# Patient Record
Sex: Male | Born: 1940 | Race: White | Hispanic: No | Marital: Married | Smoking: Former smoker
Health system: Southern US, Community
[De-identification: ages and names within clinical notes are randomized; demographics above are authoritative.]

## PROBLEM LIST (undated history)

## (undated) DIAGNOSIS — K08109 Complete loss of teeth, unspecified cause, unspecified class: Secondary | ICD-10-CM

## (undated) DIAGNOSIS — J449 Chronic obstructive pulmonary disease, unspecified: Secondary | ICD-10-CM

## (undated) DIAGNOSIS — F329 Major depressive disorder, single episode, unspecified: Secondary | ICD-10-CM

## (undated) DIAGNOSIS — K219 Gastro-esophageal reflux disease without esophagitis: Secondary | ICD-10-CM

## (undated) DIAGNOSIS — Z8719 Personal history of other diseases of the digestive system: Secondary | ICD-10-CM

## (undated) DIAGNOSIS — C801 Malignant (primary) neoplasm, unspecified: Secondary | ICD-10-CM

## (undated) DIAGNOSIS — K589 Irritable bowel syndrome without diarrhea: Secondary | ICD-10-CM

## (undated) DIAGNOSIS — Z87448 Personal history of other diseases of urinary system: Secondary | ICD-10-CM

## (undated) DIAGNOSIS — K6389 Other specified diseases of intestine: Secondary | ICD-10-CM

## (undated) DIAGNOSIS — C4490 Unspecified malignant neoplasm of skin, unspecified: Secondary | ICD-10-CM

## (undated) DIAGNOSIS — R0602 Shortness of breath: Secondary | ICD-10-CM

## (undated) DIAGNOSIS — E669 Obesity, unspecified: Secondary | ICD-10-CM

## (undated) DIAGNOSIS — N400 Enlarged prostate without lower urinary tract symptoms: Secondary | ICD-10-CM

## (undated) DIAGNOSIS — J189 Pneumonia, unspecified organism: Secondary | ICD-10-CM

## (undated) DIAGNOSIS — R350 Frequency of micturition: Secondary | ICD-10-CM

## (undated) DIAGNOSIS — K5792 Diverticulitis of intestine, part unspecified, without perforation or abscess without bleeding: Secondary | ICD-10-CM

## (undated) DIAGNOSIS — F32A Depression, unspecified: Secondary | ICD-10-CM

## (undated) DIAGNOSIS — J45909 Unspecified asthma, uncomplicated: Secondary | ICD-10-CM

## (undated) DIAGNOSIS — M2550 Pain in unspecified joint: Secondary | ICD-10-CM

## (undated) DIAGNOSIS — M23329 Other meniscus derangements, posterior horn of medial meniscus, unspecified knee: Secondary | ICD-10-CM

## (undated) DIAGNOSIS — Z8619 Personal history of other infectious and parasitic diseases: Secondary | ICD-10-CM

## (undated) DIAGNOSIS — G4730 Sleep apnea, unspecified: Secondary | ICD-10-CM

## (undated) DIAGNOSIS — S83281A Other tear of lateral meniscus, current injury, right knee, initial encounter: Secondary | ICD-10-CM

## (undated) DIAGNOSIS — E291 Testicular hypofunction: Secondary | ICD-10-CM

## (undated) DIAGNOSIS — I639 Cerebral infarction, unspecified: Secondary | ICD-10-CM

## (undated) DIAGNOSIS — M1990 Unspecified osteoarthritis, unspecified site: Secondary | ICD-10-CM

## (undated) DIAGNOSIS — N529 Male erectile dysfunction, unspecified: Secondary | ICD-10-CM

## (undated) DIAGNOSIS — H9190 Unspecified hearing loss, unspecified ear: Secondary | ICD-10-CM

## (undated) DIAGNOSIS — Z87898 Personal history of other specified conditions: Secondary | ICD-10-CM

## (undated) DIAGNOSIS — R32 Unspecified urinary incontinence: Secondary | ICD-10-CM

## (undated) DIAGNOSIS — Z972 Presence of dental prosthetic device (complete) (partial): Secondary | ICD-10-CM

## (undated) DIAGNOSIS — E119 Type 2 diabetes mellitus without complications: Secondary | ICD-10-CM

## (undated) DIAGNOSIS — R519 Headache, unspecified: Secondary | ICD-10-CM

## (undated) DIAGNOSIS — R51 Headache: Secondary | ICD-10-CM

## (undated) DIAGNOSIS — T4145XA Adverse effect of unspecified anesthetic, initial encounter: Secondary | ICD-10-CM

## (undated) DIAGNOSIS — T8859XA Other complications of anesthesia, initial encounter: Secondary | ICD-10-CM

## (undated) HISTORY — PX: EYE SURGERY: SHX253

## (undated) HISTORY — DX: Unspecified urinary incontinence: R32

## (undated) HISTORY — PX: COLONOSCOPY: SHX174

## (undated) HISTORY — DX: Other meniscus derangements, posterior horn of medial meniscus, unspecified knee: M23.329

## (undated) HISTORY — DX: Benign prostatic hyperplasia without lower urinary tract symptoms: N40.0

## (undated) HISTORY — DX: Personal history of other diseases of urinary system: Z87.448

## (undated) HISTORY — PX: UPPER GI ENDOSCOPY: SHX6162

## (undated) HISTORY — DX: Testicular hypofunction: E29.1

## (undated) HISTORY — PX: BACK SURGERY: SHX140

## (undated) HISTORY — DX: Frequency of micturition: R35.0

## (undated) HISTORY — DX: Pain in unspecified joint: M25.50

## (undated) HISTORY — PX: TONSILLECTOMY: SUR1361

## (undated) HISTORY — DX: Obesity, unspecified: E66.9

## (undated) HISTORY — PX: CARPAL TUNNEL RELEASE: SHX101

## (undated) HISTORY — DX: Unspecified malignant neoplasm of skin, unspecified: C44.90

## (undated) HISTORY — DX: Other tear of lateral meniscus, current injury, right knee, initial encounter: S83.281A

---

## 1961-11-23 HISTORY — PX: APPENDECTOMY: SHX54

## 1974-11-23 HISTORY — PX: FOOT FOREIGN BODY REMOVAL: SUR1116

## 1993-11-23 DIAGNOSIS — I639 Cerebral infarction, unspecified: Secondary | ICD-10-CM

## 1993-11-23 HISTORY — DX: Cerebral infarction, unspecified: I63.9

## 1993-11-23 HISTORY — PX: NASAL SEPTUM SURGERY: SHX37

## 1993-11-23 HISTORY — PX: HEMORROIDECTOMY: SUR656

## 1993-11-23 HISTORY — PX: UVULOPALATOPHARYNGOPLASTY (UPPP)/TONSILLECTOMY/SEPTOPLASTY: SHX6164

## 2003-10-09 ENCOUNTER — Other Ambulatory Visit: Payer: Self-pay

## 2004-09-24 ENCOUNTER — Ambulatory Visit: Payer: Self-pay

## 2004-10-10 ENCOUNTER — Ambulatory Visit: Payer: Self-pay

## 2004-11-21 ENCOUNTER — Ambulatory Visit: Payer: Self-pay | Admitting: Gastroenterology

## 2005-02-13 ENCOUNTER — Ambulatory Visit: Payer: Self-pay

## 2005-12-07 ENCOUNTER — Ambulatory Visit: Payer: Self-pay | Admitting: Internal Medicine

## 2006-08-10 ENCOUNTER — Ambulatory Visit: Payer: Self-pay | Admitting: Internal Medicine

## 2007-09-01 ENCOUNTER — Ambulatory Visit: Payer: Self-pay | Admitting: Specialist

## 2008-06-21 ENCOUNTER — Observation Stay: Payer: Self-pay | Admitting: Internal Medicine

## 2009-12-20 ENCOUNTER — Ambulatory Visit: Payer: Self-pay | Admitting: General Practice

## 2010-04-01 ENCOUNTER — Ambulatory Visit: Payer: Self-pay | Admitting: Internal Medicine

## 2011-01-15 ENCOUNTER — Ambulatory Visit: Payer: Self-pay | Admitting: General Practice

## 2011-10-29 ENCOUNTER — Ambulatory Visit: Payer: Self-pay | Admitting: General Practice

## 2012-02-08 ENCOUNTER — Ambulatory Visit: Payer: Self-pay | Admitting: General Practice

## 2012-04-21 ENCOUNTER — Ambulatory Visit: Payer: Self-pay | Admitting: Gastroenterology

## 2012-04-25 LAB — PATHOLOGY REPORT

## 2012-07-15 ENCOUNTER — Ambulatory Visit: Payer: Self-pay | Admitting: General Practice

## 2012-10-07 ENCOUNTER — Ambulatory Visit: Payer: Self-pay | Admitting: General Practice

## 2012-11-23 DIAGNOSIS — J189 Pneumonia, unspecified organism: Secondary | ICD-10-CM

## 2012-11-23 HISTORY — DX: Pneumonia, unspecified organism: J18.9

## 2012-12-15 ENCOUNTER — Other Ambulatory Visit: Payer: Self-pay | Admitting: Orthopedic Surgery

## 2012-12-15 DIAGNOSIS — M25562 Pain in left knee: Secondary | ICD-10-CM

## 2012-12-18 ENCOUNTER — Ambulatory Visit
Admission: RE | Admit: 2012-12-18 | Discharge: 2012-12-18 | Disposition: A | Discharge status: Home / Self Care | Payer: PRIVATE HEALTH INSURANCE | Source: Ambulatory Visit | Attending: Orthopedic Surgery | Admitting: Orthopedic Surgery

## 2012-12-18 DIAGNOSIS — M25562 Pain in left knee: Secondary | ICD-10-CM

## 2013-01-13 ENCOUNTER — Encounter (HOSPITAL_BASED_OUTPATIENT_CLINIC_OR_DEPARTMENT_OTHER): Payer: Self-pay | Admitting: *Deleted

## 2013-01-13 NOTE — Progress Notes (Signed)
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 Will need to review with anesthesia

## 2013-01-16 NOTE — Progress Notes (Signed)
Reviewed case with Dr Inda Merlin for here

## 2013-01-17 ENCOUNTER — Ambulatory Visit (HOSPITAL_BASED_OUTPATIENT_CLINIC_OR_DEPARTMENT_OTHER): Payer: PRIVATE HEALTH INSURANCE | Admitting: Anesthesiology

## 2013-01-17 ENCOUNTER — Ambulatory Visit (HOSPITAL_BASED_OUTPATIENT_CLINIC_OR_DEPARTMENT_OTHER)
Admission: RE | Admit: 2013-01-17 | Discharge: 2013-01-18 | Disposition: A | Discharge status: Home / Self Care | Payer: PRIVATE HEALTH INSURANCE | Source: Ambulatory Visit | Attending: Orthopedic Surgery | Admitting: Orthopedic Surgery

## 2013-01-17 ENCOUNTER — Encounter (HOSPITAL_BASED_OUTPATIENT_CLINIC_OR_DEPARTMENT_OTHER): Payer: Self-pay | Admitting: Anesthesiology

## 2013-01-17 ENCOUNTER — Encounter: Payer: Self-pay | Admitting: Physician Assistant

## 2013-01-17 ENCOUNTER — Encounter (HOSPITAL_BASED_OUTPATIENT_CLINIC_OR_DEPARTMENT_OTHER)
Admission: RE | Disposition: A | Discharge status: Home / Self Care | Payer: Self-pay | Source: Ambulatory Visit | Attending: Orthopedic Surgery

## 2013-01-17 ENCOUNTER — Other Ambulatory Visit: Payer: Self-pay | Admitting: Physician Assistant

## 2013-01-17 DIAGNOSIS — M23359 Other meniscus derangements, posterior horn of lateral meniscus, unspecified knee: Secondary | ICD-10-CM | POA: Diagnosis not present

## 2013-01-17 DIAGNOSIS — J449 Chronic obstructive pulmonary disease, unspecified: Secondary | ICD-10-CM | POA: Diagnosis present

## 2013-01-17 DIAGNOSIS — M659 Unspecified synovitis and tenosynovitis, unspecified site: Secondary | ICD-10-CM | POA: Insufficient documentation

## 2013-01-17 DIAGNOSIS — M23329 Other meniscus derangements, posterior horn of medial meniscus, unspecified knee: Secondary | ICD-10-CM | POA: Diagnosis present

## 2013-01-17 DIAGNOSIS — S83281D Other tear of lateral meniscus, current injury, right knee, subsequent encounter: Secondary | ICD-10-CM

## 2013-01-17 DIAGNOSIS — M1990 Unspecified osteoarthritis, unspecified site: Secondary | ICD-10-CM

## 2013-01-17 DIAGNOSIS — M23349 Other meniscus derangements, anterior horn of lateral meniscus, unspecified knee: Secondary | ICD-10-CM | POA: Insufficient documentation

## 2013-01-17 DIAGNOSIS — G4730 Sleep apnea, unspecified: Secondary | ICD-10-CM | POA: Insufficient documentation

## 2013-01-17 DIAGNOSIS — R0602 Shortness of breath: Secondary | ICD-10-CM | POA: Insufficient documentation

## 2013-01-17 DIAGNOSIS — Z8719 Personal history of other diseases of the digestive system: Secondary | ICD-10-CM | POA: Insufficient documentation

## 2013-01-17 DIAGNOSIS — J4489 Other specified chronic obstructive pulmonary disease: Secondary | ICD-10-CM | POA: Insufficient documentation

## 2013-01-17 DIAGNOSIS — T4145XA Adverse effect of unspecified anesthetic, initial encounter: Secondary | ICD-10-CM | POA: Insufficient documentation

## 2013-01-17 DIAGNOSIS — I639 Cerebral infarction, unspecified: Secondary | ICD-10-CM | POA: Diagnosis present

## 2013-01-17 DIAGNOSIS — K08109 Complete loss of teeth, unspecified cause, unspecified class: Secondary | ICD-10-CM | POA: Insufficient documentation

## 2013-01-17 DIAGNOSIS — S83281A Other tear of lateral meniscus, current injury, right knee, initial encounter: Secondary | ICD-10-CM | POA: Insufficient documentation

## 2013-01-17 DIAGNOSIS — M23322 Other meniscus derangements, posterior horn of medial meniscus, left knee: Secondary | ICD-10-CM

## 2013-01-17 DIAGNOSIS — K219 Gastro-esophageal reflux disease without esophagitis: Secondary | ICD-10-CM | POA: Insufficient documentation

## 2013-01-17 DIAGNOSIS — T8859XA Other complications of anesthesia, initial encounter: Secondary | ICD-10-CM

## 2013-01-17 DIAGNOSIS — M2240 Chondromalacia patellae, unspecified knee: Secondary | ICD-10-CM | POA: Diagnosis not present

## 2013-01-17 DIAGNOSIS — M23305 Other meniscus derangements, unspecified medial meniscus, unspecified knee: Secondary | ICD-10-CM | POA: Diagnosis present

## 2013-01-17 DIAGNOSIS — Z972 Presence of dental prosthetic device (complete) (partial): Secondary | ICD-10-CM

## 2013-01-17 DIAGNOSIS — J45909 Unspecified asthma, uncomplicated: Secondary | ICD-10-CM

## 2013-01-17 DIAGNOSIS — H9193 Unspecified hearing loss, bilateral: Secondary | ICD-10-CM

## 2013-01-17 DIAGNOSIS — H9190 Unspecified hearing loss, unspecified ear: Secondary | ICD-10-CM | POA: Insufficient documentation

## 2013-01-17 HISTORY — DX: Shortness of breath: R06.02

## 2013-01-17 HISTORY — DX: Sleep apnea, unspecified: G47.30

## 2013-01-17 HISTORY — PX: KNEE ARTHROSCOPY: SHX127

## 2013-01-17 HISTORY — DX: Cerebral infarction, unspecified: I63.9

## 2013-01-17 HISTORY — DX: Unspecified osteoarthritis, unspecified site: M19.90

## 2013-01-17 HISTORY — DX: Complete loss of teeth, unspecified cause, unspecified class: K08.109

## 2013-01-17 HISTORY — DX: Other complications of anesthesia, initial encounter: T88.59XA

## 2013-01-17 HISTORY — DX: Presence of dental prosthetic device (complete) (partial): Z97.2

## 2013-01-17 HISTORY — DX: Personal history of other diseases of the digestive system: Z87.19

## 2013-01-17 HISTORY — DX: Chronic obstructive pulmonary disease, unspecified: J44.9

## 2013-01-17 HISTORY — DX: Adverse effect of unspecified anesthetic, initial encounter: T41.45XA

## 2013-01-17 HISTORY — DX: Gastro-esophageal reflux disease without esophagitis: K21.9

## 2013-01-17 HISTORY — DX: Unspecified asthma, uncomplicated: J45.909

## 2013-01-17 HISTORY — DX: Unspecified hearing loss, unspecified ear: H91.90

## 2013-01-17 LAB — POCT I-STAT, CHEM 8
BUN: 13 mg/dL (ref 6–23)
Creatinine, Ser: 1 mg/dL (ref 0.50–1.35)
Hemoglobin: 15 g/dL (ref 13.0–17.0)
Potassium: 3.8 mEq/L (ref 3.5–5.1)
Sodium: 140 mEq/L (ref 135–145)

## 2013-01-17 SURGERY — ARTHROSCOPY, KNEE
Anesthesia: General | Site: Knee | Laterality: Bilateral | Wound class: Clean

## 2013-01-17 MED ORDER — LACTATED RINGERS IV SOLN
INTRAVENOUS | Status: DC
  Administered 2013-01-17 (×2): via INTRAVENOUS

## 2013-01-17 MED ORDER — HYDROMORPHONE HCL PF 1 MG/ML IJ SOLN: 0.2500 mg | INTRAMUSCULAR | Status: DC | PRN

## 2013-01-17 MED ORDER — CHLORHEXIDINE GLUCONATE 4 % EX LIQD: 60.0000 mL | Freq: Once | CUTANEOUS | Status: DC

## 2013-01-17 MED ORDER — MONTELUKAST SODIUM 10 MG PO TABS
10.0000 mg | ORAL_TABLET | Freq: Every day | ORAL | Status: DC
Start: 2013-01-17 — End: 2013-01-18
  Filled 2013-01-17: qty 1

## 2013-01-17 MED ORDER — ONDANSETRON HCL 4 MG/2ML IJ SOLN: 4.0000 mg | Freq: Four times a day (QID) | INTRAMUSCULAR | Status: DC | PRN

## 2013-01-17 MED ORDER — SODIUM CHLORIDE 0.9 % IR SOLN
Status: DC | PRN
  Administered 2013-01-17: 6000 mL

## 2013-01-17 MED ORDER — PROMETHAZINE HCL 25 MG/ML IJ SOLN: 6.2500 mg | INTRAMUSCULAR | Status: DC | PRN

## 2013-01-17 MED ORDER — BUDESONIDE-FORMOTEROL FUMARATE 160-4.5 MCG/ACT IN AERO
2.0000 | INHALATION_SPRAY | Freq: Two times a day (BID) | RESPIRATORY_TRACT | Status: DC
  Administered 2013-01-17: 2 via RESPIRATORY_TRACT
  Filled 2013-01-17: qty 6

## 2013-01-17 MED ORDER — MIDAZOLAM HCL 2 MG/2ML IJ SOLN
1.0000 mg | INTRAMUSCULAR | Status: DC | PRN
  Administered 2013-01-17: 2 mg via INTRAVENOUS

## 2013-01-17 MED ORDER — LACTATED RINGERS IV SOLN
INTRAVENOUS | Status: DC
  Administered 2013-01-17: 12:00:00 via INTRAVENOUS

## 2013-01-17 MED ORDER — ALBUTEROL SULFATE HFA 108 (90 BASE) MCG/ACT IN AERS
2.0000 | INHALATION_SPRAY | Freq: Four times a day (QID) | RESPIRATORY_TRACT | Status: DC | PRN
  Filled 2013-01-17: qty 6.7

## 2013-01-17 MED ORDER — METOCLOPRAMIDE HCL 5 MG/ML IJ SOLN: 5.0000 mg | Freq: Three times a day (TID) | INTRAMUSCULAR | Status: DC | PRN

## 2013-01-17 MED ORDER — TOPIRAMATE 25 MG PO TABS
50.0000 mg | ORAL_TABLET | Freq: Two times a day (BID) | ORAL | Status: DC
  Filled 2013-01-17 (×2): qty 2

## 2013-01-17 MED ORDER — CEFAZOLIN SODIUM-DEXTROSE 2-3 GM-% IV SOLR
2.0000 g | Freq: Four times a day (QID) | INTRAVENOUS | Status: AC
  Administered 2013-01-17 – 2013-01-18 (×3): 2 g via INTRAVENOUS

## 2013-01-17 MED ORDER — HYDROCODONE-ACETAMINOPHEN 5-325 MG PO TABS: ORAL_TABLET | ORAL | Status: DC

## 2013-01-17 MED ORDER — OXYCODONE HCL 5 MG PO TABS
5.0000 mg | ORAL_TABLET | Freq: Once | ORAL | Status: AC | PRN
Start: 2013-01-17 — End: 2013-01-17

## 2013-01-17 MED ORDER — PROPOFOL 10 MG/ML IV BOLUS
INTRAVENOUS | Status: DC | PRN
  Administered 2013-01-17: 200 mg via INTRAVENOUS

## 2013-01-17 MED ORDER — DEXTROSE 5 % IV SOLN
3.0000 g | INTRAVENOUS | Status: AC
  Administered 2013-01-17: 3 g via INTRAVENOUS

## 2013-01-17 MED ORDER — DEXAMETHASONE SODIUM PHOSPHATE 4 MG/ML IJ SOLN
INTRAMUSCULAR | Status: DC | PRN
  Administered 2013-01-17: 10 mg via INTRAVENOUS

## 2013-01-17 MED ORDER — HYDROXYZINE HCL 10 MG PO TABS
10.0000 mg | ORAL_TABLET | Freq: Every day | ORAL | Status: DC
  Administered 2013-01-18: 10 mg via ORAL
  Filled 2013-01-17: qty 1

## 2013-01-17 MED ORDER — FENTANYL CITRATE 0.05 MG/ML IJ SOLN
50.0000 ug | INTRAMUSCULAR | Status: DC | PRN
  Administered 2013-01-17: 100 ug via INTRAVENOUS

## 2013-01-17 MED ORDER — LIDOCAINE HCL (CARDIAC) 20 MG/ML IV SOLN
INTRAVENOUS | Status: DC | PRN
  Administered 2013-01-17: 100 mg via INTRAVENOUS

## 2013-01-17 MED ORDER — DOCUSATE SODIUM 100 MG PO CAPS
100.0000 mg | ORAL_CAPSULE | Freq: Two times a day (BID) | ORAL | Status: DC
  Administered 2013-01-17: 100 mg via ORAL

## 2013-01-17 MED ORDER — METOCLOPRAMIDE HCL 5 MG PO TABS: 5.0000 mg | ORAL_TABLET | Freq: Three times a day (TID) | ORAL | Status: DC | PRN

## 2013-01-17 MED ORDER — ONDANSETRON HCL 4 MG PO TABS: 4.0000 mg | ORAL_TABLET | Freq: Four times a day (QID) | ORAL | Status: DC | PRN

## 2013-01-17 MED ORDER — TIOTROPIUM BROMIDE MONOHYDRATE 18 MCG IN CAPS
18.0000 ug | ORAL_CAPSULE | Freq: Every day | RESPIRATORY_TRACT | Status: DC
  Administered 2013-01-17: 18 ug via RESPIRATORY_TRACT
  Filled 2013-01-17: qty 5

## 2013-01-17 MED ORDER — LACTATED RINGERS IV SOLN
INTRAVENOUS | Status: DC
  Administered 2013-01-17: 15:00:00 via INTRAVENOUS

## 2013-01-17 MED ORDER — CLOPIDOGREL BISULFATE 75 MG PO TABS
75.0000 mg | ORAL_TABLET | Freq: Every day | ORAL | Status: DC
  Filled 2013-01-17: qty 1

## 2013-01-17 MED ORDER — OXYCODONE HCL 5 MG/5ML PO SOLN: 5.0000 mg | Freq: Once | ORAL | Status: AC | PRN

## 2013-01-17 MED ORDER — HYDROCODONE-ACETAMINOPHEN 5-325 MG PO TABS
1.0000 | ORAL_TABLET | ORAL | Status: DC | PRN
  Administered 2013-01-17 – 2013-01-18 (×5): 2 via ORAL

## 2013-01-17 MED ORDER — OXYCODONE HCL 5 MG PO TABS: 5.0000 mg | ORAL_TABLET | ORAL | Status: DC | PRN

## 2013-01-17 SURGICAL SUPPLY — 61 items
BANDAGE ELASTIC 6 VELCRO ST LF (GAUZE/BANDAGES/DRESSINGS) ×4 IMPLANT
BANDAGE ESMARK 6X9 LF (GAUZE/BANDAGES/DRESSINGS) IMPLANT
BENZOIN TINCTURE PRP APPL 2/3 (GAUZE/BANDAGES/DRESSINGS) IMPLANT
BLADE CUTTER GATOR 3.5 (BLADE) ×2 IMPLANT
BLADE GREAT WHITE 4.2 (BLADE) ×2 IMPLANT
BLADE SURG 15 STRL LF DISP TIS (BLADE) IMPLANT
BLADE SURG 15 STRL SS (BLADE)
BNDG COHESIVE 4X5 TAN STRL (GAUZE/BANDAGES/DRESSINGS) ×4 IMPLANT
BNDG ESMARK 6X9 LF (GAUZE/BANDAGES/DRESSINGS)
CANISTER OMNI JUG 16 LITER (MISCELLANEOUS) IMPLANT
CANISTER SUCTION 2500CC (MISCELLANEOUS) IMPLANT
DRAPE ARTHROSCOPY W/POUCH 90 (DRAPES) ×4 IMPLANT
DRAPE U 20/CS (DRAPES) ×2 IMPLANT
DRSG PAD ABDOMINAL 8X10 ST (GAUZE/BANDAGES/DRESSINGS) ×2 IMPLANT
DURAPREP 26ML APPLICATOR (WOUND CARE) ×4 IMPLANT
GAUZE XEROFORM 1X8 LF (GAUZE/BANDAGES/DRESSINGS) ×2 IMPLANT
GLOVE BIO SURGEON STRL SZ7 (GLOVE) ×2 IMPLANT
GLOVE BIOGEL PI IND STRL 7.0 (GLOVE) ×1 IMPLANT
GLOVE BIOGEL PI IND STRL 7.5 (GLOVE) ×1 IMPLANT
GLOVE BIOGEL PI INDICATOR 7.0 (GLOVE) ×1
GLOVE BIOGEL PI INDICATOR 7.5 (GLOVE) ×1
GLOVE SKINSENSE NS SZ7.0 (GLOVE) ×1
GLOVE SKINSENSE STRL SZ7.0 (GLOVE) ×1 IMPLANT
GLOVE SS BIOGEL STRL SZ 7.5 (GLOVE) ×1 IMPLANT
GLOVE SUPERSENSE BIOGEL SZ 7.5 (GLOVE) ×1
GOWN PREVENTION PLUS XLARGE (GOWN DISPOSABLE) ×4 IMPLANT
HOLDER KNEE FOAM BLUE (MISCELLANEOUS) IMPLANT
KNEE WRAP E Z 3 GEL PACK (MISCELLANEOUS) ×4 IMPLANT
KWIRE 4.0 X .062IN (WIRE) IMPLANT
NDL SAFETY ECLIPSE 18X1.5 (NEEDLE) IMPLANT
NEEDLE HYPO 18GX1.5 SHARP (NEEDLE)
NEEDLE HYPO 22GX1.5 SAFETY (NEEDLE) IMPLANT
PACK ARTHROSCOPY DSU (CUSTOM PROCEDURE TRAY) ×2 IMPLANT
PACK BASIN DAY SURGERY FS (CUSTOM PROCEDURE TRAY) ×2 IMPLANT
PAD ALCOHOL SWAB (MISCELLANEOUS) IMPLANT
PAD CAST 4YDX4 CTTN HI CHSV (CAST SUPPLIES) ×1 IMPLANT
PADDING CAST COTTON 4X4 STRL (CAST SUPPLIES) ×1
SET ARTHROSCOPY TUBING (MISCELLANEOUS) ×1
SET ARTHROSCOPY TUBING LN (MISCELLANEOUS) ×1 IMPLANT
SHEET MEDIUM DRAPE 40X70 STRL (DRAPES) ×2 IMPLANT
SPONGE GAUZE 4X4 12PLY (GAUZE/BANDAGES/DRESSINGS) ×4 IMPLANT
STOCKINETTE IMPERVIOUS LG (DRAPES) ×2 IMPLANT
STRIP CLOSURE SKIN 1/2X4 (GAUZE/BANDAGES/DRESSINGS) IMPLANT
SUCTION FRAZIER TIP 10 FR DISP (SUCTIONS) IMPLANT
SUT ETHILON 4 0 PS 2 18 (SUTURE) ×4 IMPLANT
SUT FIBERWIRE #2 38 T-5 BLUE (SUTURE)
SUT PDS AB 0 CT 36 (SUTURE) IMPLANT
SUT PROLENE 3 0 PS 2 (SUTURE) IMPLANT
SUT VIC AB 0 CT1 18XCR BRD 8 (SUTURE) IMPLANT
SUT VIC AB 0 CT1 8-18 (SUTURE)
SUT VIC AB 2-0 CT1 27 (SUTURE)
SUT VIC AB 2-0 CT1 TAPERPNT 27 (SUTURE) IMPLANT
SUT VIC AB 3-0 PS1 18 (SUTURE)
SUT VIC AB 3-0 PS1 18XBRD (SUTURE) IMPLANT
SUTURE FIBERWR #2 38 T-5 BLUE (SUTURE) IMPLANT
SYR 20CC LL (SYRINGE) IMPLANT
SYR 5ML LL (SYRINGE) IMPLANT
TOWEL OR 17X24 6PK STRL BLUE (TOWEL DISPOSABLE) ×2 IMPLANT
TUBE CONNECTING 20X1/4 (TUBING) ×2 IMPLANT
WAND STAR VAC 90 (SURGICAL WAND) ×2 IMPLANT
WATER STERILE IRR 1000ML POUR (IV SOLUTION) ×2 IMPLANT

## 2013-01-17 NOTE — Progress Notes (Signed)
Assisted Dr. Gypsy Balsam with right, left, knee block. Side rails up, monitors on throughout procedure. See vital signs in flow sheet. Tolerated Procedure well.

## 2013-01-17 NOTE — H&P (Signed)
Eddie Carter is an 71 y.o. male.   Chief Complaint: Bilateral knee pain HPI: Mr. Ferraris is a 71 year-old seen for evaluation from Dr. Kramer for right knee pain and left knee pain.  He twisted his right knee two months ago pulling a donkey.  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  He has had significant pain and swelling in his right knee, but he has also had longstanding pain in his left knee which he said hurts as much as his right knee.  He underwent an MRI of his right knee that showed a lateral meniscus tear with minimal degenerative changes.  He is having pain in both knees.  He has a significant medical history for TIA 18 years ago and COPD.  He is followed by Dr. Kowalski at the [facility] of cardiology and Dr. Fleming of pulmonary.  Past Medical History  Diagnosis Date  . COPD (chronic obstructive pulmonary disease)   . Shortness of breath   . Asthma   . Sleep apnea     uses a cpap  . Stroke     some speech and thought processes slow  . Arthritis   . GERD (gastroesophageal reflux disease)     no meds  . History of GI bleed   . HOH (hard of hearing)   . Full dentures   . Complication of anesthesia     after septoplasty and uvulectomy-was in icu  . Traumatic tear of lateral meniscus of right knee     right knee  . Medial meniscus, posterior horn derangement     left knee    Past Surgical History  Procedure Laterality Date  . Foot foreign body removal  1976    left foot -multiple pieces glass  . Appendectomy  1963  . Hemorroidectomy  1995  . Carpal tunnel release      left  . Colonoscopy    . Upper gi endoscopy    . Nasal septum surgery  1995  . Uvulopalatopharyngoplasty (uppp)/tonsillectomy/septoplasty  1995    same time with septoplasty-ended up ICU almost trached  . Tonsillectomy      No family history on file. Social History:  reports that he quit smoking about 17 years ago. He does not have any smokeless tobacco  history on file. He reports that  drinks alcohol. He reports that he does not use illicit drugs.  Allergies:  Allergies  Allergen Reactions  . Asa (Aspirin) Shortness Of Breath     (Not in a hospital admission)  No results found for this or any previous visit (from the past 48 hour(s)). No results found.  Review of Systems  Constitutional: Negative.   HENT: Negative.   Eyes: Negative.   Respiratory: Negative.   Cardiovascular: Negative.   Gastrointestinal: Negative.   Genitourinary: Negative.   Musculoskeletal: Positive for joint pain.  Skin: Negative.   Neurological: Negative.   Endo/Heme/Allergies: Negative.   Psychiatric/Behavioral: Negative.     There were no vitals taken for this visit. Physical Exam  Constitutional: He is oriented to person, place, and time. He appears well-developed and well-nourished.  HENT:  Head: Normocephalic and atraumatic.  Eyes: Conjunctivae and EOM are normal. Pupils are equal, round, and reactive to light.  Neck: Neck supple.  Cardiovascular: Normal rate and regular rhythm.   Respiratory: Effort normal.  GI: Soft.  Genitourinary:  Not pertinent to current symptomatology therefore not examined.  Musculoskeletal:    Examination   of his right knee reveals 2+ effusion.  Pain medially and laterally.  Range of motion 0-100 degrees with positive lateral McMurray's.  Knee is stable to ligamentous exam with normal patella tracking.  Examination of the left knee reveals 1+ synovitis.  1+ crepitation.  Range of motion 0-120 degrees.  Knee is stable with medial McMurray's positive and normal patella tracking.  Vascular exam: Diffuse venous stasis disease distally.  Right leg slightly increased swelling compared to the left.  Neurological: He is alert and oriented to person, place, and time.  Skin: Skin is warm and dry.  Psychiatric: He has a normal mood and affect.     Assessment/ Patient Active Problem List  Diagnosis  . Traumatic tear of  lateral meniscus of right knee  . Medial meniscus, posterior horn derangement  . COPD (chronic obstructive pulmonary disease)  . Shortness of breath  . Asthma  . Sleep apnea  . Stroke  . Arthritis  . GERD (gastroesophageal reflux disease)  . History of GI bleed  . HOH (hard of hearing)  . Full dentures  . Complication of anesthesia     Plan I have talked to him and his wife in detail.  I would recommend an MRI of the left knee to further delineate this pathology and determine whether he needs a left knee surgical intervention.  He does need right knee arthroscopy and may need bilateral knee arthroscopy depending on the findings of the left knee MRI.  Risks, complications and benefits of the surgery have been described to them in detail and they understand this completely.  He has been cleared by Dr. Fleming from a pulmonary standpoint and Dr. Kowalski from a cardiac standpoint for surgery.  He is able to come off Plavix five days preoperatively.  Also a question of whether he needs any carotid evaluation.  He did have a recent stress test in the last six months which he said was normal.     Copelyn Widmer J 01/17/2013, 10:03 AM    

## 2013-01-17 NOTE — Anesthesia Procedure Notes (Signed)
Procedure Name: LMA Insertion Date/Time: 01/17/2013 12:57 PM Performed by: Zenia Resides D Pre-anesthesia Checklist: Patient identified, Emergency Drugs available, Suction available and Patient being monitored Patient Re-evaluated:Patient Re-evaluated prior to inductionOxygen Delivery Method: Circle System Utilized Preoxygenation: Pre-oxygenation with 100% oxygen Intubation Type: IV induction Ventilation: Mask ventilation without difficulty LMA: LMA with gastric port inserted LMA Size: 5.0 Number of attempts: 1 Placement Confirmation: positive ETCO2 and breath sounds checked- equal and bilateral Tube secured with: Tape Dental Injury: Teeth and Oropharynx as per pre-operative assessment

## 2013-01-17 NOTE — H&P (View-Only) (Signed)
Eddie Carter is an 72 y.o. male.   Chief Complaint: Bilateral knee pain HPI: Eddie Carter is a 72 year-old seen for evaluation from Dr. Farris Has for right knee pain and left knee pain.  He twisted his right knee two months ago pulling a donkey.  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  He has had significant pain and swelling in his right knee, but he has also had longstanding pain in his left knee which he said hurts as much as his right knee.  He underwent an MRI of his right knee that showed a lateral meniscus tear with minimal degenerative changes.  He is having pain in both knees.  He has a significant medical history for TIA 18 years ago and COPD.  He is followed by Dr. Gwen Pounds at the [facility] of cardiology and Dr. Meredeth Ide of pulmonary.  Past Medical History  Diagnosis Date  . COPD (chronic obstructive pulmonary disease)   . Shortness of breath   . Asthma   . Sleep apnea     uses a cpap  . Stroke     some speech and thought processes slow  . Arthritis   . GERD (gastroesophageal reflux disease)     no meds  . History of GI bleed   . HOH (hard of hearing)   . Full dentures   . Complication of anesthesia     after septoplasty and uvulectomy-was in icu  . Traumatic tear of lateral meniscus of right knee     right knee  . Medial meniscus, posterior horn derangement     left knee    Past Surgical History  Procedure Laterality Date  . Foot foreign body removal  1976    left foot -multiple pieces glass  . Appendectomy  1963  . Hemorroidectomy  1995  . Carpal tunnel release      left  . Colonoscopy    . Upper gi endoscopy    . Nasal septum surgery  1995  . Uvulopalatopharyngoplasty (uppp)/tonsillectomy/septoplasty  1995    same time with septoplasty-ended up ICU almost trached  . Tonsillectomy      No family history on file. Social History:  reports that he quit smoking about 17 years ago. He does not have any smokeless tobacco  history on file. He reports that  drinks alcohol. He reports that he does not use illicit drugs.  Allergies:  Allergies  Allergen Reactions  . Asa (Aspirin) Shortness Of Breath     (Not in a hospital admission)  No results found for this or any previous visit (from the past 48 hour(s)). No results found.  Review of Systems  Constitutional: Negative.   HENT: Negative.   Eyes: Negative.   Respiratory: Negative.   Cardiovascular: Negative.   Gastrointestinal: Negative.   Genitourinary: Negative.   Musculoskeletal: Positive for joint pain.  Skin: Negative.   Neurological: Negative.   Endo/Heme/Allergies: Negative.   Psychiatric/Behavioral: Negative.     There were no vitals taken for this visit. Physical Exam  Constitutional: He is oriented to person, place, and time. He appears well-developed and well-nourished.  HENT:  Head: Normocephalic and atraumatic.  Eyes: Conjunctivae and EOM are normal. Pupils are equal, round, and reactive to light.  Neck: Neck supple.  Cardiovascular: Normal rate and regular rhythm.   Respiratory: Effort normal.  GI: Soft.  Genitourinary:  Not pertinent to current symptomatology therefore not examined.  Musculoskeletal:    Examination  of his right knee reveals 2+ effusion.  Pain medially and laterally.  Range of motion 0-100 degrees with positive lateral McMurray's.  Knee is stable to ligamentous exam with normal patella tracking.  Examination of the left knee reveals 1+ synovitis.  1+ crepitation.  Range of motion 0-120 degrees.  Knee is stable with medial McMurray's positive and normal patella tracking.  Vascular exam: Diffuse venous stasis disease distally.  Right leg slightly increased swelling compared to the left.  Neurological: He is alert and oriented to person, place, and time.  Skin: Skin is warm and dry.  Psychiatric: He has a normal mood and affect.     Assessment/ Patient Active Problem List  Diagnosis  . Traumatic tear of  lateral meniscus of right knee  . Medial meniscus, posterior horn derangement  . COPD (chronic obstructive pulmonary disease)  . Shortness of breath  . Asthma  . Sleep apnea  . Stroke  . Arthritis  . GERD (gastroesophageal reflux disease)  . History of GI bleed  . HOH (hard of hearing)  . Full dentures  . Complication of anesthesia     Plan I have talked to him and his wife in detail.  I would recommend an MRI of the left knee to further delineate this pathology and determine whether he needs a left knee surgical intervention.  He does need right knee arthroscopy and may need bilateral knee arthroscopy depending on the findings of the left knee MRI.  Risks, complications and benefits of the surgery have been described to them in detail and they understand this completely.  He has been cleared by Dr. Meredeth Ide from a pulmonary standpoint and Dr. Gwen Pounds from a cardiac standpoint for surgery.  He is able to come off Plavix five days preoperatively.  Also a question of whether he needs any carotid evaluation.  He did have a recent stress test in the last six months which he said was normal.     Eddie Carter J 01/17/2013, 10:03 AM

## 2013-01-17 NOTE — Anesthesia Preprocedure Evaluation (Signed)
Anesthesia Evaluation  Patient identified by MRN, date of birth, ID band Patient awake    Reviewed: Allergy & Precautions, H&P , NPO status , Patient's Chart, lab work & pertinent test results  Airway Mallampati: II TM Distance: >3 FB Neck ROM: Full    Dental   Pulmonary shortness of breath, asthma , sleep apnea and Continuous Positive Airway Pressure Ventilation , COPDformer smoker,  S/p UPPP breath sounds clear to auscultation        Cardiovascular Rhythm:Regular Rate:Normal     Neuro/Psych CVA    GI/Hepatic GERD-  ,  Endo/Other  Morbid obesity  Renal/GU      Musculoskeletal   Abdominal (+) + obese,   Peds  Hematology   Anesthesia Other Findings   Reproductive/Obstetrics                           Anesthesia Physical Anesthesia Plan  ASA: III  Anesthesia Plan: General   Post-op Pain Management:    Induction: Intravenous  Airway Management Planned: LMA  Additional Equipment:   Intra-op Plan:   Post-operative Plan: Extubation in OR  Informed Consent: I have reviewed the patients History and Physical, chart, labs and discussed the procedure including the risks, benefits and alternatives for the proposed anesthesia with the patient or authorized representative who has indicated his/her understanding and acceptance.     Plan Discussed with: CRNA and Surgeon  Anesthesia Plan Comments: (Portal local infiltration)        Anesthesia Quick Evaluation

## 2013-01-17 NOTE — Interval H&P Note (Signed)
History and Physical Interval Note:  01/17/2013 12:43 PM  Eddie Carter  has presented today for surgery, with the diagnosis of MEDIAL AND ALTERAL MENISCUS TEARS BILATERAL  The various methods of treatment have been discussed with the patient and family. After consideration of risks, benefits and other options for treatment, the patient has consented to  Procedure(s): ARTHROSCOPY KNEE BILATERAL WITH MEDIAL AND LATERAL MENISECTOMIES (Bilateral) as a surgical intervention .  The patient's history has been reviewed, patient examined, no change in status, stable for surgery.  I have reviewed the patient's chart and labs.  Questions were answered to the patient's satisfaction.     Salvatore Marvel A

## 2013-01-17 NOTE — Anesthesia Postprocedure Evaluation (Signed)
   Anesthesia Post-op Note  Patient: Elma Limas  Procedure(s) Performed: Procedure(s): ARTHROSCOPY KNEE BILATERAL WITH MEDIAL AND LATERAL MENISECTOMIES (Bilateral)  Patient Location: PACU  Anesthesia Type:General  Level of Consciousness: awake and alert   Airway and Oxygen Therapy: Patient Spontanous Breathing  Post-op Pain: mild  Post-op Assessment: Post-op Vital signs reviewed, Patient's Cardiovascular Status Stable, Respiratory Function Stable, Patent Airway, No signs of Nausea or vomiting, Adequate PO intake and Pain level controlled  Post-op Vital Signs: stable  Complications: No apparent anesthesia complications

## 2013-01-17 NOTE — Transfer of Care (Signed)
Immediate Anesthesia Transfer of Care Note  Patient: Eddie Carter  Procedure(s) Performed: Procedure(s): ARTHROSCOPY KNEE BILATERAL WITH MEDIAL AND LATERAL MENISECTOMIES (Bilateral)  Patient Location: PACU  Anesthesia Type:General  Level of Consciousness: awake, alert  and oriented  Airway & Oxygen Therapy: Patient Spontanous Breathing and Patient connected to face mask oxygen  Post-op Assessment: Report given to PACU RN and Post -op Vital signs reviewed and stable  Post vital signs: Reviewed and stable  Complications: No apparent anesthesia complications

## 2013-01-18 ENCOUNTER — Encounter (HOSPITAL_BASED_OUTPATIENT_CLINIC_OR_DEPARTMENT_OTHER): Payer: Self-pay | Admitting: Orthopedic Surgery

## 2013-01-18 DIAGNOSIS — M23329 Other meniscus derangements, posterior horn of medial meniscus, unspecified knee: Secondary | ICD-10-CM | POA: Diagnosis not present

## 2013-01-18 NOTE — Op Note (Signed)
NAMEBECKHEM, ISADORE                ACCOUNT NO.:  [id number]  MEDICAL RECORD NO.:  [id number]  LOCATION:                                 FACILITY:  PHYSICIAN:  Elana Alm. Thurston Hole, M.D. DATE OF BIRTH:  1941/11/19  DATE OF PROCEDURE:  01/17/2013 DATE OF DISCHARGE:                              OPERATIVE REPORT   PREOPERATIVE DIAGNOSES: 1. Right knee medial meniscus tear and lateral meniscus tear. 2. Right knee chondromalacia with synovitis. 3. Left knee medial meniscus tear and lateral meniscus tear. 4. Left knee chondromalacia with synovitis.  PROCEDURE: 1. Right knee EUA followed by arthroscopic partial medial and lateral     meniscectomies with chondroplasty and partial synovectomy. 2. Left knee EUA followed by arthroscopic partial medial and lateral     meniscectomies with chondroplasty and partial synovectomy.  SURGEON:  Elana Alm. Thurston Hole, MD  ASSISTANT:  Julien Girt, PA-C  ANESTHESIA:  General.  OPERATIVE TIME:  Forty five minutes.  COMPLICATIONS:  None.  INDICATION FOR PROCEDURE:  Mr. Stollings is a 72 year old gentleman who injured his right knee with a twisting injury approximately 3 months ago.  His exam and MRIs revealed meniscal tearing with chondromalacia. He has failed multiple conservative modalities and is now to undergo bilateral knee arthroscopy.  DESCRIPTION:  Mr. Phung was brought to the operating room on January 17, 2013, after knee blocks were placed in the holding room by Anesthesia.  He was placed on operative table in supine position.  He received Ancef 3 g IV preoperatively for prophylaxis.  After being placed under general anesthesia, both knees were examined.  He had full range of motion.  Knees were stable.  Ligamentous exam were normal with patellar tracking.  Both legs were prepped using sterile DuraPrep and draped using sterile technique.  Time-out procedure was called and both knees were identified as the correct knees.  Initially, the  right knee was addressed arthroscopically.  Through an anterolateral portal, the arthroscope with a pump attached was placed into an anteromedial portal, an arthroscopic probe was placed.  On initial inspection, the medial compartment, the articular cartilage showed 25-30% grade 3 chondromalacia, which was debrided.  Medial meniscus showed tearing of the posterior medial horn of which 25-30% was resected back to a stable rim.  Intercondylar notch inspected.  Anterior and posterior cruciate ligaments were normal.  Lateral compartment inspected.  He had 25-30% grade 3 chondromalacia, which was debrided.  Lateral meniscus showed tearing of the posterolateral and anterior horn of which 30-40% was resected back to a stable rim.  Patellofemoral joint showed 30% grade 3 chondromalacia, which was debrided.  The patella tracked normally. Moderate synovitis and the medial and lateral gutters were debrided, otherwise, this was free of pathology.  After this was done, it was felt that all pathology in the right knee had been addressed satisfactorily. The instruments were removed.  Portals closed with 3-0 nylon suture.  Attention was then turned to the left knee.  Through an anterolateral portal, the arthroscope with a pump attached was placed into an anteromedial portal, an arthroscopic probe was placed.  On initial inspection of the medial compartment, he was found to have 25% grade 3  chondromalacia, which was debrided.  Medial meniscus showed tearing of the posterior horn of which 20% was resected back to a stable rim. Intercondylar notch inspected.  Anterior and posterior cruciate ligaments were normal.  The lateral compartment inspected.  He had grade 1 and 2 chondromalacia.  Lateral meniscus showed a partial tear 25-30% posterolateral corner, which was resected back to a stable rim. Patellofemoral joint showed 25% grade 3 chondromalacia, which was debrided.  The patella tracked normally.   Moderate synovitis in the medial and lateral gutters were debrided, otherwise this was free of pathology.  After this done, it was felt that all pathology had been satisfactorily addressed.  The instruments removed.  Portals closed with 3-0 nylon suture.  Sterile dressings were applied to both knees, and then the patient was awakened, extubated, and taken to recovery room in stable condition.  Needle and sponge counts were correct x2 at the end of the case.  FOLLOWUP CARE:  Mr. Cordaro to be followed overnight at the [facility] for IV pain control, neurovascular monitoring, sleep apnea monitoring, and blood pressure monitoring.  He will be discharged tomorrow on Percocet for pain.  He will be seen back in the office in a week for sutures out and followup.     Wessie Shanks A. Thurston Hole, M.D.     RAW/MEDQ  D:  01/17/2013  T:  01/18/2013  Job:  [id number]

## 2013-07-25 ENCOUNTER — Inpatient Hospital Stay: Payer: Self-pay | Admitting: Specialist

## 2013-07-25 DIAGNOSIS — J440 Chronic obstructive pulmonary disease with acute lower respiratory infection: Secondary | ICD-10-CM | POA: Diagnosis not present

## 2013-07-25 LAB — PROTIME-INR
INR: 1
Prothrombin Time: 13.5 secs (ref 11.5–14.7)

## 2013-07-25 LAB — CBC
HCT: 45.2 % (ref 40.0–52.0)
HGB: 15.2 g/dL (ref 13.0–18.0)
RBC: 4.94 10*6/uL (ref 4.40–5.90)
RDW: 14.9 % — ABNORMAL HIGH (ref 11.5–14.5)
WBC: 12.6 10*3/uL — ABNORMAL HIGH (ref 3.8–10.6)

## 2013-07-25 LAB — URINALYSIS, COMPLETE
Bacteria: NONE SEEN
Bilirubin,UR: NEGATIVE
Blood: NEGATIVE
Ketone: NEGATIVE
Nitrite: NEGATIVE
Protein: NEGATIVE
RBC,UR: 1 /HPF (ref 0–5)
Specific Gravity: 1.012 (ref 1.003–1.030)
Squamous Epithelial: <1
WBC UR: 1 /HPF (ref 0–5)

## 2013-07-25 LAB — BASIC METABOLIC PANEL
BUN: 18 mg/dL (ref 7–18)
Chloride: 101 mmol/L (ref 98–107)
Creatinine: 1.03 mg/dL (ref 0.60–1.30)
EGFR (Non-African Amer.): >60
Potassium: 4.4 mmol/L (ref 3.5–5.1)

## 2013-07-25 LAB — TROPONIN I: Troponin-I: <0.02 ng/mL

## 2013-07-26 LAB — LIPID PANEL
Cholesterol: 172 mg/dL (ref 0–200)
HDL Cholesterol: 37 mg/dL — ABNORMAL LOW (ref 40–60)
VLDL Cholesterol, Calc: 50 mg/dL — ABNORMAL HIGH (ref 5–40)

## 2013-07-26 LAB — COMPREHENSIVE METABOLIC PANEL
Albumin: 3.2 g/dL — ABNORMAL LOW (ref 3.4–5.0)
Alkaline Phosphatase: 68 U/L (ref 50–136)
Anion Gap: 7 (ref 7–16)
Bilirubin,Total: 0.5 mg/dL (ref 0.2–1.0)
Calcium, Total: 9 mg/dL (ref 8.5–10.1)
Creatinine: 1.43 mg/dL — ABNORMAL HIGH (ref 0.60–1.30)
EGFR (African American): 57 — ABNORMAL LOW
EGFR (Non-African Amer.): 49 — ABNORMAL LOW
Glucose: 354 mg/dL — ABNORMAL HIGH (ref 65–99)
SGPT (ALT): 71 U/L (ref 12–78)
Sodium: 131 mmol/L — ABNORMAL LOW (ref 136–145)
Total Protein: 6.6 g/dL (ref 6.4–8.2)

## 2013-07-26 LAB — CBC WITH DIFFERENTIAL/PLATELET
Basophil #: 0.1 10*3/uL (ref 0.0–0.1)
Basophil %: 0.4 %
Eosinophil %: 0 %
Lymphocyte #: 0.8 10*3/uL — ABNORMAL LOW (ref 1.0–3.6)
MCHC: 34.4 g/dL (ref 32.0–36.0)
Monocyte #: 0.4 x10 3/mm (ref 0.2–1.0)
Neutrophil #: 18.7 10*3/uL — ABNORMAL HIGH (ref 1.4–6.5)
Neutrophil %: 93.8 %
RBC: 4.82 10*6/uL (ref 4.40–5.90)
WBC: 20 10*3/uL — ABNORMAL HIGH (ref 3.8–10.6)

## 2013-07-26 LAB — MAGNESIUM: Magnesium: 2.1 mg/dL

## 2013-07-28 LAB — BASIC METABOLIC PANEL
Anion Gap: 5 — ABNORMAL LOW (ref 7–16)
BUN: 21 mg/dL — ABNORMAL HIGH (ref 7–18)
Calcium, Total: 8.7 mg/dL (ref 8.5–10.1)
Creatinine: 1.06 mg/dL (ref 0.60–1.30)
EGFR (Non-African Amer.): >60
Glucose: 156 mg/dL — ABNORMAL HIGH (ref 65–99)
Osmolality: 278 (ref 275–301)
Potassium: 4.2 mmol/L (ref 3.5–5.1)
Sodium: 136 mmol/L (ref 136–145)

## 2013-07-28 LAB — CBC WITH DIFFERENTIAL/PLATELET
Basophil #: 0 10*3/uL (ref 0.0–0.1)
Basophil %: 0.2 %
Eosinophil #: 0 10*3/uL (ref 0.0–0.7)
Eosinophil %: 0 %
Lymphocyte #: 1.6 10*3/uL (ref 1.0–3.6)
Lymphocyte %: 10 %
MCH: 30.4 pg (ref 26.0–34.0)
Monocyte #: 0.8 x10 3/mm (ref 0.2–1.0)
Monocyte %: 5.3 %
Neutrophil #: 13.2 10*3/uL — ABNORMAL HIGH (ref 1.4–6.5)
RBC: 4.6 10*6/uL (ref 4.40–5.90)

## 2013-07-30 LAB — CULTURE, BLOOD (SINGLE)

## 2013-08-01 ENCOUNTER — Encounter: Payer: Self-pay | Admitting: Pulmonary Disease

## 2013-08-01 ENCOUNTER — Institutional Professional Consult (permissible substitution): Payer: PRIVATE HEALTH INSURANCE | Admitting: Pulmonary Disease

## 2013-08-22 ENCOUNTER — Encounter: Payer: Self-pay | Admitting: Pulmonary Disease

## 2013-08-22 ENCOUNTER — Ambulatory Visit (INDEPENDENT_AMBULATORY_CARE_PROVIDER_SITE_OTHER): Payer: PRIVATE HEALTH INSURANCE | Admitting: Pulmonary Disease

## 2013-08-22 VITALS — BP 110/70 | HR 95 | Ht 72.0 in | Wt 330.0 lb

## 2013-08-22 DIAGNOSIS — J8410 Pulmonary fibrosis, unspecified: Secondary | ICD-10-CM

## 2013-08-22 DIAGNOSIS — J849 Interstitial pulmonary disease, unspecified: Secondary | ICD-10-CM | POA: Insufficient documentation

## 2013-08-22 DIAGNOSIS — J449 Chronic obstructive pulmonary disease, unspecified: Secondary | ICD-10-CM

## 2013-08-22 DIAGNOSIS — R0602 Shortness of breath: Secondary | ICD-10-CM

## 2013-08-22 NOTE — Patient Instructions (Signed)
We will request records of your clinic notes, echocardiogram and stress test from [facility]. We will send you for pulmonary function testing and a Chest X-ray at [facility] Use your symbicort with a spacer We will see you back in one month or sooner if needed

## 2013-08-22 NOTE — Assessment & Plan Note (Signed)
Given Eddie Carter lengthy smoking history COPD is very likely. I do not have records of prior pulmonary function testing. Considering the fact that I am concerned about concomitant pulmonary fibrosis I will order a full pulmonary function tests to more thoroughly evaluate him.  At this point, I do not see major gaps in his therapy but I question whether or not he should be using his Symbicort with a spacer.  Plan: -He plans to have a flu shot at a local pharmacy this week and refused it today -Add a spacer for Symbicort, encourage regular use -Continue Spiriva -Full pulmonary function testing -Oxygen at night

## 2013-08-22 NOTE — Progress Notes (Signed)
Subjective:    Patient ID: Eddie Carter, male    DOB: October 26, 1941, 72 y.o.   MRN: [id number]  HPI  PCP: [facility] clinic sent him here for shortness of breath  About 7-8 weeks ago he started having coughing and cougldn't clear any mucus; difficulty brathn, it got wose [place] [facility] [place]  On the second day of September he went there and was checked out.  He had been taking prednisone which made his blood sugar go profoundly high (400) so he was hospitalized at [facility].  Was hospitalized for about 5 days.    He continues to have trouble breathing.  He as COPD and asthma and hadan infection in his lungs which caused his breathing trouble. He was never sure if he had pneumonia or not.  He was discharged on prednisone and an antibiotic, but he isn't sure which one.  He was started on diabetic medicine.  Shortness of breath continues to be the primary prioblem.  Any exertion causs it.  Sometimes he has the dyspna at rest.  He sleeps with CPAP with oxygen at night, he has been on these for at least ten years.  Oxygen added a few years ago.    He also "uses" portable oxygen as needed.  He was told to start using oxygen at least three or four.  Smoked 15 cigarettes a day for a few years, then increased to 2 packs per day 40 years, quit smoking 1997.  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  He had an inhalationation exposure to it in [place] or 1972.  He was exposed to an Tourist information centre manager called urea-formadehide in 1978 when it was being used for spray insulation in his house.  He was exposed to this for years before he had it removed.  He said this made him sick while h ewas exposed to it.  Saw Flemming at [facility] years ago and [facility] before him.  He started using CPAP with Demayo.    Has been referred to Dr. Avelino Leeds who ordered a carotid ultrasound and EKG which he said was OK.  Had a stress test a year ago which was normal.      Past Medical History  Diagnosis Date  . COPD (chronic obstructive pulmonary disease)   . Shortness of breath   . Asthma   . Sleep apnea     uses a cpap  . Stroke     some speech and thought processes slow  . Arthritis   . GERD (gastroesophageal reflux disease)     no meds  . History of GI bleed   . HOH (hard of hearing)   . Full dentures   . Complication of anesthesia     after septoplasty and uvulectomy-was in icu  . Traumatic tear of lateral meniscus of right knee     right knee  . Medial meniscus, posterior horn derangement     left knee     Family History  Problem Relation Age of Onset  . Cancer Mother   . Heart attack Father      History   Social History  . Marital Status: Married    Spouse Name: N/A    Number of Children: N/A  . Years of Education: N/A   Occupational History  . Not on file.   Social History Main Topics  . Smoking status: Former Smoker -- 1.50 packs/day for 40 years    Types:  Cigarettes    Quit date: 01/14/1996  . Smokeless tobacco: Never Used  . Alcohol Use: Yes     Comment: occ  . Drug Use: No  . Sexual Activity: Not on file   Other Topics Concern  . Not on file   Social History Narrative  . No narrative on file     Allergies  Allergen Reactions  . Asa [Aspirin] Shortness Of Breath  . Nsaids      Outpatient Prescriptions Prior to Visit  Medication Sig Dispense Refill  . albuterol (PROVENTIL HFA;VENTOLIN HFA) 108 (90 BASE) MCG/ACT inhaler Inhale 2 puffs into the lungs every 6 (six) hours as needed for wheezing.      . AVODART 0.5 MG capsule Take 1 capsule by mouth daily.      . budesonide-formoterol (SYMBICORT) 160-4.5 MCG/ACT inhaler Inhale 2 puffs into the lungs 2 (two) times daily.      . clopidogrel (PLAVIX) 75 MG tablet Take 75 mg by mouth daily.      . fish oil-omega-3 fatty acids 1000 MG capsule Take 2 g by mouth daily.      Marland Kitchen glipiZIDE (GLUCOTROL) 5 MG tablet Take 1 tablet by mouth 2 (two) times daily.      .  montelukast (SINGULAIR) 10 MG tablet Take 10 mg by mouth at bedtime.      . solifenacin (VESICARE) 10 MG tablet Take by mouth daily.      . tamsulosin (FLOMAX) 0.4 MG CAPS capsule Take 1 capsule by mouth daily.      Marland Kitchen testosterone (ANDROGEL) 50 MG/5GM GEL Place 5 g onto the skin daily.      Marland Kitchen tiotropium (SPIRIVA) 18 MCG inhalation capsule Place 18 mcg into inhaler and inhale daily.      Marland Kitchen topiramate (TOPAMAX) 50 MG tablet Take 50 mg by mouth 2 (two) times daily.       No facility-administered medications prior to visit.      Review of Systems  Constitutional: Negative for fever, chills, diaphoresis, activity change, appetite change, fatigue and unexpected weight change.  HENT: Negative for hearing loss, ear pain, nosebleeds, congestion, sore throat, facial swelling, rhinorrhea, sneezing, mouth sores, trouble swallowing, neck pain, neck stiffness, dental problem, voice change, postnasal drip, sinus pressure, tinnitus and ear discharge.   Eyes: Negative for photophobia, discharge, itching and visual disturbance.  Respiratory: Positive for chest tightness and shortness of breath. Negative for apnea, cough, choking, wheezing and stridor.   Cardiovascular: Negative for chest pain, palpitations and leg swelling.  Gastrointestinal: Negative for nausea, vomiting, abdominal pain, constipation, blood in stool and abdominal distention.  Genitourinary: Negative for dysuria, urgency, frequency, hematuria, flank pain, decreased urine volume and difficulty urinating.  Musculoskeletal: Negative for myalgias, back pain, joint swelling, arthralgias and gait problem.  Skin: Negative for color change, pallor and rash.  Neurological: Negative for dizziness, tremors, seizures, syncope, speech difficulty, weakness, light-headedness, numbness and headaches.  Hematological: Negative for adenopathy. Does not bruise/bleed easily.  Psychiatric/Behavioral: Negative for confusion, sleep disturbance and agitation. The  patient is not nervous/anxious.        Objective:   Physical Exam  Filed Vitals:   08/22/13 1514  BP: 110/70  Pulse: 95  Height: 6' (1.829 m)  Weight: 330 lb (149.687 kg)  SpO2: 92%   Gen: well appearing, no acute distress HEENT: NCAT, PERRL, EOMi, OP clear, neck supple without masses PULM: Few crackles in the bases, some scattered exp wheezes CV: RRR, no mgr, no JVD AB: BS+, soft, nontender, no  hsm Ext: warm, chronic pitting edema, no clubbing, no cyanosis Derm: no rash or skin breakdown Neuro: A&Ox4, CN II-XII intact, strength 5/5 in all 4 extremities       Assessment & Plan:   COPD (chronic obstructive pulmonary disease) Given Mr. Encinas lengthy smoking history COPD is very likely. I do not have records of prior pulmonary function testing. Considering the fact that I am concerned about concomitant pulmonary fibrosis I will order a full pulmonary function tests to more thoroughly evaluate him.  At this point, I do not see major gaps in his therapy but I question whether or not he should be using his Symbicort with a spacer.  Plan: -He plans to have a flu shot at a local pharmacy this week and refused it today -Add a spacer for Symbicort, encourage regular use -Continue Spiriva -Full pulmonary function testing -Oxygen at night   ILD (interstitial lung disease) Mr. 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 He has had a CT scan in 2007 which showed interstitial changes in his right lung as well as some mediastinal lymphadenopathy. His most recent chest x-ray which was performed on 07/25/2013 showed some interstitial changes in his lungs bilaterally which I believe may be related to this.  Plan: -To start, we will order full pulmonary function testing and a repeat chest x-ray -Depending on those results he may need a serologic workup as well as a CT scan of his chest.   Updated Medication  List Outpatient Encounter Prescriptions as of 08/22/2013  Medication Sig Dispense Refill  . albuterol (PROVENTIL HFA;VENTOLIN HFA) 108 (90 BASE) MCG/ACT inhaler Inhale 2 puffs into the lungs every 6 (six) hours as needed for wheezing.      . AVODART 0.5 MG capsule Take 1 capsule by mouth daily.      . budesonide-formoterol (SYMBICORT) 160-4.5 MCG/ACT inhaler Inhale 2 puffs into the lungs 2 (two) times daily.      . clopidogrel (PLAVIX) 75 MG tablet Take 75 mg by mouth daily.      . fish oil-omega-3 fatty acids 1000 MG capsule Take 2 g by mouth daily.      Marland Kitchen glipiZIDE (GLUCOTROL) 5 MG tablet Take 1 tablet by mouth 2 (two) times daily.      . hydrOXYzine (ATARAX/VISTARIL) 10 MG tablet Take 2 tablets by mouth at bedtime.      . insulin detemir (LEVEMIR) 100 UNIT/ML injection Inject 15 Units into the skin daily.      . montelukast (SINGULAIR) 10 MG tablet Take 10 mg by mouth at bedtime.      . solifenacin (VESICARE) 10 MG tablet Take by mouth daily.      . tamsulosin (FLOMAX) 0.4 MG CAPS capsule Take 1 capsule by mouth daily.      Marland Kitchen testosterone (ANDROGEL) 50 MG/5GM GEL Place 5 g onto the skin daily.      Marland Kitchen tiotropium (SPIRIVA) 18 MCG inhalation capsule Place 18 mcg into inhaler and inhale daily.      Marland Kitchen topiramate (TOPAMAX) 50 MG tablet Take 50 mg by mouth 2 (two) times daily.       No facility-administered encounter medications on file as of 08/22/2013.

## 2013-08-22 NOTE — Assessment & Plan Note (Signed)
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 He has had a CT scan in 2007 which showed interstitial changes in his right lung as well as some mediastinal lymphadenopathy. His most recent chest x-ray which was performed on 07/25/2013 showed some interstitial changes in his lungs bilaterally which I believe may be related to this.  Plan: -To start, we will order full pulmonary function testing and a repeat chest x-ray -Depending on those results he may need a serologic workup as well as a CT scan of his chest.

## 2013-09-19 ENCOUNTER — Ambulatory Visit: Payer: PRIVATE HEALTH INSURANCE | Admitting: Pulmonary Disease

## 2013-09-19 NOTE — Progress Notes (Signed)
No show

## 2013-09-25 ENCOUNTER — Telehealth: Payer: Self-pay | Admitting: Pulmonary Disease

## 2013-09-25 ENCOUNTER — Encounter: Payer: Self-pay | Admitting: Pulmonary Disease

## 2013-09-25 ENCOUNTER — Ambulatory Visit (INDEPENDENT_AMBULATORY_CARE_PROVIDER_SITE_OTHER): Payer: PRIVATE HEALTH INSURANCE | Admitting: Pulmonary Disease

## 2013-09-25 VITALS — BP 122/72 | HR 82 | Ht 72.0 in | Wt 309.0 lb

## 2013-09-25 DIAGNOSIS — J8410 Pulmonary fibrosis, unspecified: Secondary | ICD-10-CM

## 2013-09-25 DIAGNOSIS — R0602 Shortness of breath: Secondary | ICD-10-CM

## 2013-09-25 DIAGNOSIS — J849 Interstitial pulmonary disease, unspecified: Secondary | ICD-10-CM

## 2013-09-25 MED ORDER — DOXYCYCLINE HYCLATE 50 MG PO CAPS: 100.0000 mg | ORAL_CAPSULE | Freq: Two times a day (BID) | ORAL | Status: DC

## 2013-09-25 MED ORDER — PREDNISONE 10 MG PO TABS: ORAL_TABLET | ORAL | Status: DC

## 2013-09-25 NOTE — Patient Instructions (Signed)
Take the prednisone and the doxycycline as written We will order full pulmonary function testing and a Chest CT and call you with the results  We will see you back in 3-4 weeks or sooner if needed

## 2013-09-25 NOTE — Progress Notes (Signed)
Subjective:    Patient ID: Eddie Carter, male    DOB: Apr 06, 1941, 72 y.o.   MRN: [id number]  Synopsis: Eddie Carter first saw the [facility] pulmonary clinic in September of 2014 for evaluation of shortness of breath. He had smoked heavily in the past and had previously been given a diagnosis of COPD. He also noted in that visit that he had 2 separate inhalational injuries in the 1970s: 1 with Olefin and a second with urea-formaldehyde.   HPI  09/19/2013 ROV> Eddie Carter is still having a lot of breahting trouble in the last few weeks, and he is getting up some sputum. He feels beter when he coughs it up.  There is no color to the sputum, and he has had no fever.   Never had his lung function test or the CT scan sine the last visit.  He continues to take his inhalers on a regular basis and is currently using the Symbicort with a spacer. He feels that his shortness of breath has been getting slowly worse over the last year. He was recently seen by his cardiologist and had an echocardiogram which was normal.  Past Medical History  Diagnosis Date  . COPD (chronic obstructive pulmonary disease)   . Shortness of breath   . Asthma   . Sleep apnea     uses a cpap  . Stroke     some speech and thought processes slow  . Arthritis   . GERD (gastroesophageal reflux disease)     no meds  . History of GI bleed   . HOH (hard of hearing)   . Full dentures   . Complication of anesthesia     after septoplasty and uvulectomy-was in icu  . Traumatic tear of lateral meniscus of right knee     right knee  . Medial meniscus, posterior horn derangement     left knee     Review of Systems  Constitutional: Positive for fatigue. Negative for fever and chills.  HENT: Negative for congestion, postnasal drip, rhinorrhea and sinus pressure.   Respiratory: Positive for cough and shortness of breath. Negative for wheezing.   Cardiovascular: Positive for leg swelling. Negative for chest pain and  palpitations.       Objective:   Physical Exam  Filed Vitals:   09/25/13 1107  BP: 122/72  Pulse: 82  Height: 6' (1.829 m)  Weight: 309 lb (140.161 kg)  SpO2: 90%  RA  Walked 500 feet on room air and did not desaturate below 88%  Gen: mild dyspnea HEENT: NCAT, EOMi PULM: Crackles in bases CV: RRR, no mgr AB: BS+, soft, nontender Ext: chronic edema       Assessment & Plan:   Shortness of breath Unfortunately, Eddie Carter did not go get to the CT scan or the pulmonary function tests that I recommended after the last visit. I remain concerned that he has an interstitial lung disease which is causing his shortness of breath. I also feel that COPD is likely.  Today on exam he is not wheezing like a patient with COPD might. He does have crackles in the bases of his lungs which concern me for interstitial lung disease.  He recently had an echocardiogram performed by his cardiologist which was normal.  Plan: -Because of his history of COPD I will treat his shortness of breath as a flare with prednisone and doxycycline, but again we really need to figure out if he's got another process happening. -CT chest and PFT  Updated Medication List Outpatient Encounter Prescriptions as of 09/25/2013  Medication Sig  . albuterol (PROVENTIL HFA;VENTOLIN HFA) 108 (90 BASE) MCG/ACT inhaler Inhale 2 puffs into the lungs every 6 (six) hours as needed for wheezing.  . AVODART 0.5 MG capsule Take 1 capsule by mouth daily.  . budesonide-formoterol (SYMBICORT) 160-4.5 MCG/ACT inhaler Inhale 2 puffs into the lungs 2 (two) times daily.  . clopidogrel (PLAVIX) 75 MG tablet Take 75 mg by mouth daily.  . fish oil-omega-3 fatty acids 1000 MG capsule Take 2 g by mouth daily.  Marland Kitchen glipiZIDE (GLUCOTROL) 5 MG tablet Take 1 tablet by mouth 2 (two) times daily.  . hydrOXYzine (ATARAX/VISTARIL) 10 MG tablet Take 2 tablets by mouth at bedtime.  . insulin detemir (LEVEMIR) 100 UNIT/ML injection Inject 15  Units into the skin daily.  . montelukast (SINGULAIR) 10 MG tablet Take 10 mg by mouth at bedtime.  . solifenacin (VESICARE) 10 MG tablet Take by mouth daily.  . tamsulosin (FLOMAX) 0.4 MG CAPS capsule Take 1 capsule by mouth daily.  Marland Kitchen testosterone (ANDROGEL) 50 MG/5GM GEL Place 5 g onto the skin daily.  Marland Kitchen tiotropium (SPIRIVA) 18 MCG inhalation capsule Place 18 mcg into inhaler and inhale daily.  Marland Kitchen topiramate (TOPAMAX) 50 MG tablet Take 50 mg by mouth 2 (two) times daily.

## 2013-09-25 NOTE — Telephone Encounter (Signed)
Pharmacy was confused on the quantity that was sent in by BQ. Verified with pharmacist that pt is to take the prednisone with these directions. #30 was sent but pt only needs #27. [facility] just wanted to verify sig due to the quantity that was sent in. Nothing further was needed.

## 2013-09-25 NOTE — Telephone Encounter (Signed)
I spoke with [facility]. Was advised the prednisone directions and quantity does not match up. They need clarification. Pt is there now. I spoke with Lillia Abed and she will speak with Dr. Kendrick Fries. Once done she will call the pharmacy. Will forward message to her.

## 2013-09-25 NOTE — Addendum Note (Signed)
Addended by: Caryl Ada on: 09/25/2013 03:23 PM   Modules accepted: Orders

## 2013-09-25 NOTE — Assessment & Plan Note (Addendum)
Unfortunately, [name] did not go get to the CT scan or the pulmonary function tests that I recommended after the last visit. I remain concerned that he has an interstitial lung disease which is causing his shortness of breath. I also feel that COPD is likely.  Today on exam he is not wheezing like a patient with COPD might. He does have crackles in the bases of his lungs which concern me for interstitial lung disease.  He recently had an echocardiogram performed by his cardiologist which was normal.  Plan: -Because of his history of COPD I will treat his shortness of breath as a flare with prednisone and doxycycline, but again we really need to figure out if he's got another process happening. -CT chest and PFT

## 2013-09-28 ENCOUNTER — Ambulatory Visit (HOSPITAL_COMMUNITY)
Admission: RE | Admit: 2013-09-28 | Discharge: 2013-09-28 | Disposition: A | Discharge status: Home / Self Care | Payer: PRIVATE HEALTH INSURANCE | Source: Ambulatory Visit | Attending: Pulmonary Disease | Admitting: Pulmonary Disease

## 2013-09-28 DIAGNOSIS — J438 Other emphysema: Secondary | ICD-10-CM | POA: Insufficient documentation

## 2013-09-28 DIAGNOSIS — K7689 Other specified diseases of liver: Secondary | ICD-10-CM | POA: Insufficient documentation

## 2013-09-28 DIAGNOSIS — R918 Other nonspecific abnormal finding of lung field: Secondary | ICD-10-CM | POA: Insufficient documentation

## 2013-09-28 DIAGNOSIS — J849 Interstitial pulmonary disease, unspecified: Secondary | ICD-10-CM

## 2013-09-28 DIAGNOSIS — G4730 Sleep apnea, unspecified: Secondary | ICD-10-CM | POA: Insufficient documentation

## 2013-10-02 ENCOUNTER — Telehealth: Payer: Self-pay

## 2013-10-02 DIAGNOSIS — J8410 Pulmonary fibrosis, unspecified: Secondary | ICD-10-CM

## 2013-10-02 DIAGNOSIS — J439 Emphysema, unspecified: Secondary | ICD-10-CM

## 2013-10-02 NOTE — Telephone Encounter (Signed)
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  This means he really needs to use his inhalers and the medicines I recently prescribed.  Also, when he finishes the prednisone he needs to have the following labs:      Ana      Ers      CPR      Rf      Ccp      Anti scl 70      Anti centromere      Ssa      Sub      Aldolase      Anti jo 1            Thanks      Kipp Brood                  ----- Message -----         From: Rad Results In Interface         Sent: 09/29/2013   4:31 PM           To: Lupita Leash, MD       ------

## 2013-10-03 ENCOUNTER — Ambulatory Visit: Payer: Self-pay | Admitting: Pulmonary Disease

## 2013-10-03 LAB — PULMONARY FUNCTION TEST

## 2013-10-04 ENCOUNTER — Other Ambulatory Visit (INDEPENDENT_AMBULATORY_CARE_PROVIDER_SITE_OTHER): Payer: PRIVATE HEALTH INSURANCE

## 2013-10-04 DIAGNOSIS — J8410 Pulmonary fibrosis, unspecified: Secondary | ICD-10-CM

## 2013-10-04 DIAGNOSIS — J439 Emphysema, unspecified: Secondary | ICD-10-CM

## 2013-10-04 LAB — RHEUMATOID FACTOR: Rhuematoid fact SerPl-aCnc: <10 IU/mL (ref <=14)

## 2013-10-04 NOTE — Telephone Encounter (Signed)
lmom for pt to call back to the office so I can relay ct chest results and inform pt of lab orders post prednisone tx. Caulfield,Ashley L

## 2013-10-04 NOTE — Telephone Encounter (Signed)
Pt returned my telephone call, I passed along the CT chest results.  I verified that he finished his prednisone pack and advised him to go to the lab to have his scheduled labs done.  Pt states he is having these today.  I assured him that we will contact him with these results as we receive them.   Joslyne Marshburn L

## 2013-10-05 LAB — SJOGRENS SYNDROME-B EXTRACTABLE NUCLEAR ANTIBODY: SSB (La) (ENA) Antibody, IgG: <1 AU/mL (ref <30)

## 2013-10-05 LAB — JO-1 ANTIBODY-IGG: Jo-1 Antibody, IgG: 1 AU/mL (ref <30)

## 2013-10-05 LAB — CENTROMERE ANTIBODIES: Centromere Ab Screen: <1 AU/mL (ref <30)

## 2013-10-05 LAB — SJOGRENS SYNDROME-A EXTRACTABLE NUCLEAR ANTIBODY: SSA (Ro) (ENA) Antibody, IgG: 17 AU/mL (ref <30)

## 2013-10-06 LAB — ALDOLASE: Aldolase: 8.3 U/L — ABNORMAL HIGH (ref <=8.1)

## 2013-10-11 ENCOUNTER — Telehealth: Payer: Self-pay | Admitting: *Deleted

## 2013-10-11 ENCOUNTER — Encounter: Payer: Self-pay | Admitting: Pulmonary Disease

## 2013-10-11 NOTE — Telephone Encounter (Signed)
Message copied by Caryl Ada on Wed Oct 11, 2013  9:35 AM ------      Message from: Max Fickle B      Created: Wed Oct 11, 2013  9:31 AM       L,            Please let him know that his PFT's showed COPD and scarring/fibrosis.  We will talk about this on the next visit.            Thanks,      B ------

## 2013-10-11 NOTE — Telephone Encounter (Signed)
lmtcb x1 

## 2013-10-12 NOTE — Telephone Encounter (Signed)
Pt is aware of results. 

## 2013-10-25 ENCOUNTER — Encounter (INDEPENDENT_AMBULATORY_CARE_PROVIDER_SITE_OTHER): Payer: Self-pay

## 2013-10-25 ENCOUNTER — Ambulatory Visit (INDEPENDENT_AMBULATORY_CARE_PROVIDER_SITE_OTHER): Payer: PRIVATE HEALTH INSURANCE | Admitting: Pulmonary Disease

## 2013-10-25 ENCOUNTER — Encounter: Payer: Self-pay | Admitting: Pulmonary Disease

## 2013-10-25 VITALS — BP 132/76 | HR 77 | Temp 98.0°F | Ht 72.0 in | Wt 340.0 lb

## 2013-10-25 DIAGNOSIS — J8410 Pulmonary fibrosis, unspecified: Secondary | ICD-10-CM

## 2013-10-25 DIAGNOSIS — R918 Other nonspecific abnormal finding of lung field: Secondary | ICD-10-CM | POA: Insufficient documentation

## 2013-10-25 DIAGNOSIS — J449 Chronic obstructive pulmonary disease, unspecified: Secondary | ICD-10-CM

## 2013-10-25 DIAGNOSIS — J849 Interstitial pulmonary disease, unspecified: Secondary | ICD-10-CM

## 2013-10-25 DIAGNOSIS — J441 Chronic obstructive pulmonary disease with (acute) exacerbation: Secondary | ICD-10-CM

## 2013-10-25 MED ORDER — ROFLUMILAST 500 MCG PO TABS: 500.0000 ug | ORAL_TABLET | Freq: Every day | ORAL | Status: DC

## 2013-10-25 NOTE — Progress Notes (Signed)
Subjective:    Patient ID: Eddie Carter, male    DOB: 06/10/1941, 72 y.o.   MRN: [id number]  Synopsis: Mr. Frampton first saw the [facility] pulmonary clinic in September of 2014 for evaluation of shortness of breath. He had smoked heavily in the past and had previously been given a diagnosis of COPD. He also noted in that visit that he had 2 separate inhalational injuries in the 1970s: 1 with Olefin and a second with urea-formaldehyde.   09/2013 Full PFT [facility] > Ratio 58%, FEV1 1.94L (63% pred), only 4% change with bronchodilator; TLC 5.07L (73% pred), ERV 0.20L (13% pred) DLCO 12.7 (43% pred) 09/2013 CT Chest > emphysema and intersitial (interlobular, peripheral based, more prominent upper lobes) thickening upper lobe predomniant, no clear GGO or lymphadenopathy  HPI   09/19/2013 ROV> Mr. Ferrick is still having a lot of breahting trouble in the last few weeks, and he is getting up some sputum. He feels beter when he coughs it up.  There is no color to the sputum, and he has had no fever.   Never had his lung function test or the CT scan sine the last visit.  He continues to take his inhalers on a regular basis and is currently using the Symbicort with a spacer. He feels that his shortness of breath has been getting slowly worse over the last year. He was recently seen by his cardiologist and had an echocardiogram which was normal.  10/24/2013 ROV > Breathing is improved since the last visit, but still not great.  It sounds like he responded really well to the prednisone that I gave him last time.  He says that 10-15 weeks ago he had a bad cough started and caused a lot of chest pain.  He was originally sent to the ER and then was hospitalized for several days, noted to be diabetic.    Past Medical History  Diagnosis Date  . COPD (chronic obstructive pulmonary disease)   . Shortness of breath   . Asthma   . Sleep apnea     uses a cpap  . Stroke     some speech and thought processes slow   . Arthritis   . GERD (gastroesophageal reflux disease)     no meds  . History of GI bleed   . HOH (hard of hearing)   . Full dentures   . Complication of anesthesia     after septoplasty and uvulectomy-was in icu  . Traumatic tear of lateral meniscus of right knee     right knee  . Medial meniscus, posterior horn derangement     left knee     Review of Systems  Constitutional: Positive for fatigue. Negative for fever and chills.  HENT: Negative for congestion, postnasal drip, rhinorrhea and sinus pressure.   Respiratory: Positive for cough and shortness of breath. Negative for wheezing.   Cardiovascular: Positive for leg swelling. Negative for chest pain and palpitations.       Objective:   Physical Exam   Filed Vitals:   10/25/13 0906  BP: 132/76  Pulse: 77  Temp: 98 F (36.7 C)  TempSrc: Oral  Height: 6' (1.829 m)  Weight: 340 lb (154.223 kg)  SpO2: 93%  RA  Walked 500 feet on room air and did not desaturate below 93%  Gen: no acute distress HEENT: NCAT, EOMi PULM: Crackles in bases, no wheezing CV: RRR, no mgr AB: BS+, soft, nontender Ext: chronic edema improved today  Assessment & Plan:   ILD (interstitial lung disease) I do not believe that the CT scan findings are consistent with usual interstitial pneumonitis.  However, in his age group this would be the most likely etiology. I still think that it is possible that his prior inhalational exposures are somehow related to his interstitial lung disease. It is not clear to me if this is progressing. He clearly is quite limited by his lung disease given the profound diffusion limitation seen on pulmonary function testing which is a result of both emphysema and interstitial lung disease.  Plan: -He really needs to try to lose weight and maximize his COPD therapy. -Pulmonary rehabilitation and exercises is key -We will collect another set of pulmonary function testing and CT scan being in 6 months, at  that point the last one of our thoracic radiologists to read the study  COPD (chronic obstructive pulmonary disease) He has moderate airflow obstruction but his shortness of breath is exacerbated by the concomitant interstitial lung disease, obesity and profound deconditioning. He has been very well treated by his primary care physician with good inhalers. The only addition that I have is Roflumilast given the fact that he has had 2 exacerbations in the last year.  Plan: -Pulmonary rehabilitation, pulmonary rehabilitation, pulmonary rehabilitation -Add Roflumilast excellent-continue Spiriva and Symbicort  Pulmonary nodules These were noted on his 09/2013 study but he remembers having these years ago.  Repeat CT chest in 6 months    Updated Medication List Outpatient Encounter Prescriptions as of 10/25/2013  Medication Sig  . albuterol (PROVENTIL HFA;VENTOLIN HFA) 108 (90 BASE) MCG/ACT inhaler Inhale 2 puffs into the lungs every 6 (six) hours as needed for wheezing.  . AVODART 0.5 MG capsule Take 1 capsule by mouth daily.  . budesonide-formoterol (SYMBICORT) 160-4.5 MCG/ACT inhaler Inhale 2 puffs into the lungs 2 (two) times daily.  . clopidogrel (PLAVIX) 75 MG tablet Take 75 mg by mouth daily.  . fish oil-omega-3 fatty acids 1000 MG capsule Take 2 g by mouth daily.  . hydrOXYzine (ATARAX/VISTARIL) 10 MG tablet Take 2 tablets by mouth at bedtime.  . insulin detemir (LEVEMIR) 100 UNIT/ML injection Inject 15 Units into the skin daily.  . montelukast (SINGULAIR) 10 MG tablet Take 10 mg by mouth at bedtime.  Marland Kitchen OVER THE COUNTER MEDICATION Stool softener..once daily as needed  . solifenacin (VESICARE) 10 MG tablet Take by mouth daily.  . tamsulosin (FLOMAX) 0.4 MG CAPS capsule Take 1 capsule by mouth daily.  Marland Kitchen testosterone (ANDROGEL) 50 MG/5GM GEL Place 5 g onto the skin daily.  Marland Kitchen tiotropium (SPIRIVA) 18 MCG inhalation capsule Place 18 mcg into inhaler and inhale daily.  Marland Kitchen topiramate  (TOPAMAX) 50 MG tablet Take 50 mg by mouth 2 (two) times daily.  . roflumilast (DALIRESP) 500 MCG TABS tablet Take 1 tablet (500 mcg total) by mouth daily.  . [DISCONTINUED] doxycycline (VIBRAMYCIN) 50 MG capsule Take 2 capsules (100 mg total) by mouth 2 (two) times daily.  . [DISCONTINUED] glipiZIDE (GLUCOTROL) 5 MG tablet Take 1 tablet by mouth 2 (two) times daily.  . [DISCONTINUED] predniSONE (DELTASONE) 10 MG tablet Take 40mg  po daily for 3 days, then take 30mg  po daily for 3 days, then take 20mg  po daily for two days, then take 10mg  po daily for 2 days

## 2013-10-25 NOTE — Patient Instructions (Signed)
Keep using your inhalers as you are doing Start taking Daliresp one tablet daily; if you have significant diarrhea, hold the pills for a few days then start taking them every other day Go to pulmonary rehab We will see you back in 3months or sooner if needed We will repeat a CT chest and Pulmonary function tests in 6 months

## 2013-10-25 NOTE — Assessment & Plan Note (Signed)
He has moderate airflow obstruction but his shortness of breath is exacerbated by the concomitant interstitial lung disease, obesity and profound deconditioning. He has been very well treated by his primary care physician with good inhalers. The only addition that I have is Roflumilast given the fact that he has had 2 exacerbations in the last year.  Plan: -Pulmonary rehabilitation, pulmonary rehabilitation, pulmonary rehabilitation -Add Roflumilast excellent-continue Spiriva and Symbicort

## 2013-10-25 NOTE — Assessment & Plan Note (Signed)
These were noted on his 09/2013 study but he remembers having these years ago.  Repeat CT chest in 6 months

## 2013-10-25 NOTE — Assessment & Plan Note (Addendum)
I do not believe that the CT scan findings are consistent with usual interstitial pneumonitis.  However, in his age group this would be the most likely etiology. I still think that it is possible that his prior inhalational exposures are somehow related to his interstitial lung disease. It is not clear to me if this is progressing. He clearly is quite limited by his lung disease given the profound diffusion limitation seen on pulmonary function testing which is a result of both emphysema and interstitial lung disease.  Plan: -He really needs to try to lose weight and maximize his COPD therapy. -Pulmonary rehabilitation and exercises is key -We will collect another set of pulmonary function testing and CT scan being in 6 months, at that point the last one of our thoracic radiologists to read the study

## 2013-10-26 ENCOUNTER — Encounter: Payer: Self-pay | Admitting: Pulmonary Disease

## 2013-11-14 ENCOUNTER — Telehealth: Payer: Self-pay | Admitting: Family Medicine

## 2013-11-14 NOTE — Telephone Encounter (Signed)
Thanks Zella Ball, I'll keep an eye out for that.

## 2013-11-14 NOTE — Telephone Encounter (Signed)
Patient came in today wanting Dr Kendrick Fries fill out handicap form Sent interoffice mail

## 2013-11-21 ENCOUNTER — Encounter: Payer: Self-pay | Admitting: Pulmonary Disease

## 2013-11-23 ENCOUNTER — Encounter: Payer: Self-pay | Admitting: Pulmonary Disease

## 2013-12-05 ENCOUNTER — Telehealth: Payer: Self-pay

## 2013-12-05 NOTE — Telephone Encounter (Signed)
Spoke with pt, he is aware that handicap sticker form is filled out and ready for pickup.  Nothing further needed.

## 2013-12-05 NOTE — Telephone Encounter (Signed)
Pt's handicap form is complete.  LMTCB X1 to let pt know it is ready for pickup.

## 2013-12-15 ENCOUNTER — Encounter: Payer: Self-pay | Admitting: Pulmonary Disease

## 2013-12-24 ENCOUNTER — Encounter: Payer: Self-pay | Admitting: Pulmonary Disease

## 2014-01-02 ENCOUNTER — Telehealth: Payer: Self-pay | Admitting: Pulmonary Disease

## 2014-01-02 MED ORDER — TIOTROPIUM BROMIDE MONOHYDRATE 18 MCG IN CAPS: 18.0000 ug | ORAL_CAPSULE | Freq: Every day | RESPIRATORY_TRACT | Status: DC

## 2014-01-02 MED ORDER — BUDESONIDE-FORMOTEROL FUMARATE 160-4.5 MCG/ACT IN AERO: 2.0000 | INHALATION_SPRAY | Freq: Two times a day (BID) | RESPIRATORY_TRACT | Status: DC

## 2014-01-02 NOTE — Telephone Encounter (Signed)
Spoke with pt. Mad him aware we can send in refill for symbicort and spiriva. Do not see flonase on his med list or were we have ever filled this for him. He is asking for RX for this. Please advise Dr. Lake Bells thanks

## 2014-01-08 MED ORDER — FLUTICASONE PROPIONATE 50 MCG/ACT NA SUSP: 2.0000 | Freq: Every day | NASAL | Status: DC

## 2014-01-08 NOTE — Telephone Encounter (Signed)
Pt aware rx has been sent. Nothing further needed

## 2014-01-08 NOTE — Telephone Encounter (Signed)
OK by me to refill flonase,5 refills

## 2014-01-21 ENCOUNTER — Encounter: Payer: Self-pay | Admitting: Pulmonary Disease

## 2014-02-09 ENCOUNTER — Telehealth: Payer: Self-pay | Admitting: Pulmonary Disease

## 2014-02-09 NOTE — Telephone Encounter (Signed)
Called and spoke with [facility]. She reports pt told her we follow him for his CPAP. I advised her in BQ notes he mentions nothing about CPAP. So we are not the ones who follow him for this. She voiced her understanding

## 2014-02-21 ENCOUNTER — Encounter: Payer: Self-pay | Admitting: Pulmonary Disease

## 2014-03-19 ENCOUNTER — Encounter: Payer: Self-pay | Admitting: Pulmonary Disease

## 2014-05-14 DIAGNOSIS — L570 Actinic keratosis: Secondary | ICD-10-CM | POA: Diagnosis not present

## 2014-05-14 DIAGNOSIS — I8310 Varicose veins of unspecified lower extremity with inflammation: Secondary | ICD-10-CM | POA: Diagnosis not present

## 2014-08-20 ENCOUNTER — Other Ambulatory Visit: Payer: Self-pay | Admitting: Emergency Medicine

## 2014-08-20 MED ORDER — TIOTROPIUM BROMIDE MONOHYDRATE 18 MCG IN CAPS: 18.0000 ug | ORAL_CAPSULE | Freq: Every day | RESPIRATORY_TRACT | Status: DC

## 2014-10-04 DIAGNOSIS — N401 Enlarged prostate with lower urinary tract symptoms: Secondary | ICD-10-CM | POA: Diagnosis not present

## 2014-10-04 DIAGNOSIS — E291 Testicular hypofunction: Secondary | ICD-10-CM | POA: Diagnosis not present

## 2014-10-04 DIAGNOSIS — Z87448 Personal history of other diseases of urinary system: Secondary | ICD-10-CM | POA: Diagnosis not present

## 2014-10-04 DIAGNOSIS — E663 Overweight: Secondary | ICD-10-CM | POA: Diagnosis not present

## 2014-10-04 DIAGNOSIS — R32 Unspecified urinary incontinence: Secondary | ICD-10-CM | POA: Diagnosis not present

## 2014-10-28 DIAGNOSIS — W19XXXA Unspecified fall, initial encounter: Secondary | ICD-10-CM | POA: Diagnosis not present

## 2014-10-28 DIAGNOSIS — M545 Low back pain: Secondary | ICD-10-CM | POA: Diagnosis not present

## 2014-10-28 DIAGNOSIS — M25552 Pain in left hip: Secondary | ICD-10-CM | POA: Diagnosis not present

## 2014-10-28 DIAGNOSIS — M25569 Pain in unspecified knee: Secondary | ICD-10-CM | POA: Diagnosis not present

## 2014-10-28 DIAGNOSIS — M79605 Pain in left leg: Secondary | ICD-10-CM | POA: Diagnosis not present

## 2014-10-28 DIAGNOSIS — M6281 Muscle weakness (generalized): Secondary | ICD-10-CM | POA: Diagnosis not present

## 2014-10-28 DIAGNOSIS — R531 Weakness: Secondary | ICD-10-CM | POA: Diagnosis not present

## 2014-10-28 LAB — COMPREHENSIVE METABOLIC PANEL
ALBUMIN: 3.8 g/dL (ref 3.4–5.0)
Alkaline Phosphatase: 69 U/L
Anion Gap: 10 (ref 7–16)
BUN: 22 mg/dL — ABNORMAL HIGH (ref 7–18)
Bilirubin,Total: 0.7 mg/dL (ref 0.2–1.0)
CHLORIDE: 100 mmol/L (ref 98–107)
Calcium, Total: 8.9 mg/dL (ref 8.5–10.1)
Co2: 28 mmol/L (ref 21–32)
Creatinine: 1.14 mg/dL (ref 0.60–1.30)
EGFR (African American): >60
GLUCOSE: 126 mg/dL — AB (ref 65–99)
OSMOLALITY: 281 (ref 275–301)
POTASSIUM: 3.9 mmol/L (ref 3.5–5.1)
SGOT(AST): 37 U/L (ref 15–37)
SGPT (ALT): 69 U/L — ABNORMAL HIGH
Sodium: 138 mmol/L (ref 136–145)
Total Protein: 7.2 g/dL (ref 6.4–8.2)

## 2014-10-28 LAB — CBC
HCT: 53.7 % — AB (ref 40.0–52.0)
HGB: 17.5 g/dL (ref 13.0–18.0)
MCH: 30.6 pg (ref 26.0–34.0)
MCHC: 32.6 g/dL (ref 32.0–36.0)
MCV: 94 fL (ref 80–100)
PLATELETS: 189 10*3/uL (ref 150–440)
RBC: 5.72 10*6/uL (ref 4.40–5.90)
RDW: 14.3 % (ref 11.5–14.5)
WBC: 14 10*3/uL — ABNORMAL HIGH (ref 3.8–10.6)

## 2014-10-28 LAB — TROPONIN I

## 2014-10-29 ENCOUNTER — Observation Stay: Payer: Self-pay | Admitting: Internal Medicine

## 2014-10-30 LAB — LIPID PANEL
Cholesterol: 159 mg/dL (ref 0–200)
HDL Cholesterol: 32 mg/dL — ABNORMAL LOW (ref 40–60)
Ldl Cholesterol, Calc: 98 mg/dL (ref 0–100)
Triglycerides: 147 mg/dL (ref 0–200)
VLDL Cholesterol, Calc: 29 mg/dL (ref 5–40)

## 2014-10-30 LAB — HEMOGLOBIN A1C: Hemoglobin A1C: 7 % — ABNORMAL HIGH (ref 4.2–6.3)

## 2014-11-01 ENCOUNTER — Other Ambulatory Visit (HOSPITAL_COMMUNITY): Payer: Self-pay | Admitting: Specialist

## 2014-11-02 ENCOUNTER — Encounter (HOSPITAL_COMMUNITY): Payer: Self-pay

## 2014-11-02 ENCOUNTER — Encounter (HOSPITAL_COMMUNITY)
Admission: RE | Admit: 2014-11-02 | Discharge: 2014-11-02 | Disposition: A | Discharge status: Home / Self Care | Payer: Medicare Other | Source: Ambulatory Visit | Attending: Specialist | Admitting: Specialist

## 2014-11-02 DIAGNOSIS — Z8673 Personal history of transient ischemic attack (TIA), and cerebral infarction without residual deficits: Secondary | ICD-10-CM | POA: Insufficient documentation

## 2014-11-02 DIAGNOSIS — J439 Emphysema, unspecified: Secondary | ICD-10-CM | POA: Insufficient documentation

## 2014-11-02 DIAGNOSIS — J45909 Unspecified asthma, uncomplicated: Secondary | ICD-10-CM | POA: Insufficient documentation

## 2014-11-02 DIAGNOSIS — K760 Fatty (change of) liver, not elsewhere classified: Secondary | ICD-10-CM | POA: Diagnosis not present

## 2014-11-02 DIAGNOSIS — R918 Other nonspecific abnormal finding of lung field: Secondary | ICD-10-CM | POA: Diagnosis not present

## 2014-11-02 DIAGNOSIS — E119 Type 2 diabetes mellitus without complications: Secondary | ICD-10-CM | POA: Insufficient documentation

## 2014-11-02 DIAGNOSIS — R51 Headache: Secondary | ICD-10-CM | POA: Diagnosis not present

## 2014-11-02 DIAGNOSIS — Z01818 Encounter for other preprocedural examination: Secondary | ICD-10-CM | POA: Diagnosis not present

## 2014-11-02 DIAGNOSIS — C4491 Basal cell carcinoma of skin, unspecified: Secondary | ICD-10-CM | POA: Diagnosis not present

## 2014-11-02 DIAGNOSIS — Z87891 Personal history of nicotine dependence: Secondary | ICD-10-CM | POA: Insufficient documentation

## 2014-11-02 DIAGNOSIS — I351 Nonrheumatic aortic (valve) insufficiency: Secondary | ICD-10-CM | POA: Diagnosis not present

## 2014-11-02 DIAGNOSIS — K219 Gastro-esophageal reflux disease without esophagitis: Secondary | ICD-10-CM | POA: Diagnosis not present

## 2014-11-02 DIAGNOSIS — I071 Rheumatic tricuspid insufficiency: Secondary | ICD-10-CM | POA: Diagnosis not present

## 2014-11-02 DIAGNOSIS — J849 Interstitial pulmonary disease, unspecified: Secondary | ICD-10-CM | POA: Insufficient documentation

## 2014-11-02 DIAGNOSIS — I340 Nonrheumatic mitral (valve) insufficiency: Secondary | ICD-10-CM | POA: Insufficient documentation

## 2014-11-02 DIAGNOSIS — I10 Essential (primary) hypertension: Secondary | ICD-10-CM | POA: Insufficient documentation

## 2014-11-02 DIAGNOSIS — G4733 Obstructive sleep apnea (adult) (pediatric): Secondary | ICD-10-CM | POA: Insufficient documentation

## 2014-11-02 DIAGNOSIS — M1990 Unspecified osteoarthritis, unspecified site: Secondary | ICD-10-CM | POA: Insufficient documentation

## 2014-11-02 DIAGNOSIS — J8410 Pulmonary fibrosis, unspecified: Secondary | ICD-10-CM | POA: Insufficient documentation

## 2014-11-02 DIAGNOSIS — J449 Chronic obstructive pulmonary disease, unspecified: Secondary | ICD-10-CM | POA: Diagnosis not present

## 2014-11-02 DIAGNOSIS — K922 Gastrointestinal hemorrhage, unspecified: Secondary | ICD-10-CM | POA: Insufficient documentation

## 2014-11-02 DIAGNOSIS — H9190 Unspecified hearing loss, unspecified ear: Secondary | ICD-10-CM | POA: Insufficient documentation

## 2014-11-02 HISTORY — DX: Malignant (primary) neoplasm, unspecified: C80.1

## 2014-11-02 HISTORY — DX: Headache: R51

## 2014-11-02 HISTORY — DX: Headache, unspecified: R51.9

## 2014-11-02 HISTORY — DX: Type 2 diabetes mellitus without complications: E11.9

## 2014-11-02 LAB — CBC
HEMATOCRIT: 46.4 % (ref 39.0–52.0)
Hemoglobin: 15.9 g/dL (ref 13.0–17.0)
MCH: 31.6 pg (ref 26.0–34.0)
MCHC: 34.3 g/dL (ref 30.0–36.0)
MCV: 92.2 fL (ref 78.0–100.0)
Platelets: 179 10*3/uL (ref 150–400)
RBC: 5.03 MIL/uL (ref 4.22–5.81)
RDW: 14 % (ref 11.5–15.5)
WBC: 11 10*3/uL — ABNORMAL HIGH (ref 4.0–10.5)

## 2014-11-02 LAB — BASIC METABOLIC PANEL
Anion gap: 13 (ref 5–15)
BUN: 17 mg/dL (ref 6–23)
CALCIUM: 9.5 mg/dL (ref 8.4–10.5)
CO2: 25 meq/L (ref 19–32)
Chloride: 99 mEq/L (ref 96–112)
Creatinine, Ser: 0.88 mg/dL (ref 0.50–1.35)
GFR calc Af Amer: >90 mL/min (ref >90)
GFR, EST NON AFRICAN AMERICAN: 84 mL/min — AB (ref >90)
GLUCOSE: 122 mg/dL — AB (ref 70–99)
Potassium: 4.6 mEq/L (ref 3.7–5.3)
Sodium: 137 mEq/L (ref 137–147)

## 2014-11-02 LAB — NO BLOOD PRODUCTS

## 2014-11-02 LAB — SURGICAL PCR SCREEN
MRSA, PCR: NEGATIVE
Staphylococcus aureus: NEGATIVE

## 2014-11-02 NOTE — Progress Notes (Signed)
Anesthesia PAT Evaluation:  Patient is a 73 year old male scheduled for left L3-4 microdiscectomy on 11/05/14 by Dr. Louanne Skye.  His PAT visit was on Friday afternoon.   History includes former smoker, COPD, ILD, pulmonary nodules, OSA with CPAP use, asthma, HTN, DM2, CVA '95 (on Plavix), GERD, skin cancer (BCC), GI bleed, hard of hearing, arthritis, headaches. He required an ICU stay after UPPP and nasal septoplasty. He had a lot of pain but doesn't recall if he required re-intubation.  His UPPP suture dehiscence in the early post-operative course.  BMI is consistent with morbid obesity.    PCP is Dr. [name] who is an Internist/Cardiologist in [place].  Patient states he see him as a primary care provider.  Notes are pending, but patient was evaluated by him just yesterday for preoperative clearance.  Dr. Jefm Petty office did fax over a signed note of medical and cardiac clearance with EKG from Dr. Lavera Guise. He reports just getting discharged from [facility] last weekend for admission for pain management.  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    He was evaluated by cardiologist Dr. Serafina Royals [facility]) for dyspnea back in 2013/2014. He has also seen pulmonologists Dr. Wallene Huh [facility]) and Dr. Simonne Maffucci [facility] Shape), primarily in 2014.  The most recent [facility] records are still pending, but he has clearance notes and tests scanned under the Media tab that were received prior to his last surgery here on 01/17/13.  EKG on 11/01/14 (Dr. Lavera Guise): SR, LAD, right BBB, LVH. EKG appears stable when compared with 01/11/13 Jefm Bryant) that was scanned under Media tab.   Echo on 08/04/13 Jefm Bryant; scanned under Media tab): Normal LV systolic function, EF 17%. Normal RV size and function. Normal atria size. Trace AR, MR, TR. (Prior echo on 02/17/12 showed RVSP 51 mmHg, consistent with moderate pulmonary  hypertension.)  Nuclear stress test on 02/17/12 Jefm Bryant): Normal treadmill ECG without evidence of ischemia or dysrhythmia. Poor exercise tolerance for age. Normal LVF, EF 58%. Normal myocardial perfusion images at rest and peak stress without evidence of ischemia.  Chest CT 09/28/13:  1. Mild emphysema with patchy subpleural and peribronchovascular reticulation consistent with mild pulmonary fibrosis. 2. No focal ground-glass opacity or confluent airspace opacity. No adenopathy or significant pleural effusion. 3. Small pulmonary nodules bilaterally, the largest in the right lower lobe measuring 7 mm. If the patient is at high risk for bronchogenic carcinoma, follow-up chest CT at 3-6 months is recommended. If the patient is at low risk for bronchogenic carcinoma, follow-up chest CT at 6-12 months is recommended. This recommendation follows the consensus statement: Guidelines for Management of Small Pulmonary Nodules Detected on CT Scans: A Statement from the [employer] as published in Radiology 2005; 237:395-400. 4. Hepatic steatosis.  By notes, 09/2013 Full PFT [facility] > Ratio 58%, FEV1 1.94L (63% pred), only 4% change with bronchodilator; TLC 5.07L (73% pred), ERV 0.20L (13% pred) DLCO 12.7 (43% pred).  He thought he had a CXR at [facility] within the past couple of weeks, but the last one received was from 07/25/13.  Preoperative labs noted.    If any additional [facility] or PCP records are received, then they will have to be reviewed by his assigned anesthesiologist on the day of surgery.  He reports overall good control of his DM.  He has no known CAD/MI history but does carry some risk factors.  His  last stress test was within the past three years and his EKG appears stable. He does have a pulmonary history as outlined above, but does not require supplemental O2. He uses CPAP. No acute cardiopulmonary symptoms reported. Heart RRR and lungs clear on exam. He tolerated general anesthesia in  2014 and has medical/cardiac clearance from Dr. Lavera Guise following evaluation yesterday.  Based on these factors, I would anticipate that he could proceed as planned if there are no acute changes.  Further evaluated by his assigned anesthesiologist on the day of surgery  George Darold [facility]/Anesthesiology Phone [phone] 11/02/2014 4:59 PM

## 2014-11-02 NOTE — Progress Notes (Signed)
Pt. Reports PCP- is Dr. Lavera Guise in [place].  Pt. Seen recently & cleared for surgery for medical & cardiac. Noted in PCP notes that pt. Had cardiac studies at some point, request made to [facility] & they responded that they do not have anything on this pt. Pt. Reports that his memory is affected by medicines & age.  Pt. Also reports some care with Dr. Lake Bells for his lungs.  He admits to using CPAP somenights & sometimes uses O2 with it but not always.  Pt. Reports that he stopped Plavix about 2 weeks ago for a spinal injection & is still off of it. Pt. Insistent that he had a CXR at [facility] 2 weeks ago; will fax [facility] again for the CXR, will also fax [facility] for cardiac studies.    Report of all of these findings to A. Zelenak,PA-C.

## 2014-11-02 NOTE — Progress Notes (Signed)
Orders not signed in epic, Dr. Otho Ket office notified.

## 2014-11-02 NOTE — Pre-Procedure Instructions (Addendum)
Eddie Carter  11/02/2014   Your procedure is scheduled on:  Monday, Dec. 14  Report to [facility] Main Entrance "A" at 5:30 AM.  Call this number if you have problems the morning of surgery: [phone]   Remember:   Do not eat food or drink liquids after midnight.   Take these medicines the morning of surgery with A SIP OF WATER: symbicort (bring to hospital), avodart, flonase if needed, neurontin, eye drops, methocarbamol (robaxin) if needed, percocet if needed, afrin nasal spray if needed, flomax, spiriva             Stop taking any nonsteroidal anti-inflammatory drugs: advil, ibuprofen, aleve. Stop fish oil, vitamins and herbal medications   Do not wear jewelry, make-up or nail polish.  Do not wear lotions, powders, or perfumes. You may wear deodorant.  Do not shave 48 hours prior to surgery. Men may shave face and neck.  Do not bring valuables to the hospital.  [facility] is not responsible                  for any belongings or valuables.               Contacts, dentures or bridgework may not be worn into surgery.  Leave suitcase in the car. After surgery it may be brought to your room.  For patients admitted to the hospital, discharge time is determined by your                treatment team.               Patients discharged the day of surgery will not be allowed to drive  home.  Name and phone number of your driver:   Special Instructions: review preparing for surgery    Please read over the following fact sheets that you were given: Pain Booklet, Coughing and Deep Breathing, MRSA Information and Surgical Site Infection Prevention

## 2014-11-04 MED ORDER — DEXTROSE 5 % IV SOLN
3.0000 g | INTRAVENOUS | Status: AC
  Administered 2014-11-05: 3 g via INTRAVENOUS
  Filled 2014-11-04: qty 3000

## 2014-11-04 NOTE — Anesthesia Preprocedure Evaluation (Addendum)
Anesthesia Evaluation  Patient identified by MRN, date of birth, ID band Patient awake    Reviewed: Allergy & Precautions, H&P , NPO status , reviewed documented beta blocker date and time   Airway Mallampati: II  TM Distance: >3 FB Neck ROM: Full    Dental  (+) Edentulous Upper, Edentulous Lower, Dental Advisory Given   Pulmonary asthma , sleep apnea and Continuous Positive Airway Pressure Ventilation , former smoker (60 pavck year hx),  breath sounds clear to auscultation        Cardiovascular hypertension, Pt. on medications Rhythm:Regular  EF 55-60% both by ECHO and Stress both within last three years,  Followed regularly   Neuro/Psych    GI/Hepatic Neg liver ROS, GERD-  Medicated and Controlled,  Endo/Other  diabetes, Well Controlled, Type 2  Renal/GU      Musculoskeletal   Abdominal (+) + obese,   Peds  Hematology negative hematology ROS (+)   Anesthesia Other Findings Left hip and low back pain, left groin pain.  Huge neck and tongue; no view past tongue.  Pt left CPAP mask at home.  Reproductive/Obstetrics                           Anesthesia Physical Anesthesia Plan  ASA: III  Anesthesia Plan: General   Post-op Pain Management:    Induction: Intravenous  Airway Management Planned: Oral ETT  Additional Equipment:   Intra-op Plan:   Post-operative Plan: Extubation in OR  Informed Consent: I have reviewed the patients History and Physical, chart, labs and discussed the procedure including the risks, benefits and alternatives for the proposed anesthesia with the patient or authorized representative who has indicated his/her understanding and acceptance.     Plan Discussed with:   Anesthesia Plan Comments: (Multinodal pain RX, limit narcotics 2nd to BMI and OSA)        Anesthesia Quick Evaluation

## 2014-11-05 ENCOUNTER — Inpatient Hospital Stay (HOSPITAL_COMMUNITY)
Admission: RE | Admit: 2014-11-05 | Discharge: 2014-11-07 | DRG: 460 | Disposition: A | Discharge status: Home / Self Care | Payer: No Typology Code available for payment source | Source: Ambulatory Visit | Attending: Specialist | Admitting: Specialist

## 2014-11-05 ENCOUNTER — Encounter (HOSPITAL_COMMUNITY): Payer: Self-pay | Admitting: *Deleted

## 2014-11-05 ENCOUNTER — Ambulatory Visit (HOSPITAL_COMMUNITY): Payer: No Typology Code available for payment source | Admitting: Vascular Surgery

## 2014-11-05 ENCOUNTER — Encounter (HOSPITAL_COMMUNITY)
Admission: RE | Disposition: A | Discharge status: Home / Self Care | Payer: PRIVATE HEALTH INSURANCE | Source: Ambulatory Visit | Attending: Specialist

## 2014-11-05 ENCOUNTER — Ambulatory Visit (HOSPITAL_COMMUNITY): Payer: No Typology Code available for payment source

## 2014-11-05 ENCOUNTER — Ambulatory Visit (HOSPITAL_COMMUNITY): Payer: No Typology Code available for payment source | Admitting: Anesthesiology

## 2014-11-05 DIAGNOSIS — Z419 Encounter for procedure for purposes other than remedying health state, unspecified: Secondary | ICD-10-CM

## 2014-11-05 DIAGNOSIS — R0602 Shortness of breath: Secondary | ICD-10-CM | POA: Diagnosis not present

## 2014-11-05 DIAGNOSIS — G4730 Sleep apnea, unspecified: Secondary | ICD-10-CM | POA: Diagnosis present

## 2014-11-05 DIAGNOSIS — J449 Chronic obstructive pulmonary disease, unspecified: Secondary | ICD-10-CM | POA: Diagnosis not present

## 2014-11-05 DIAGNOSIS — Z87891 Personal history of nicotine dependence: Secondary | ICD-10-CM

## 2014-11-05 DIAGNOSIS — H9190 Unspecified hearing loss, unspecified ear: Secondary | ICD-10-CM | POA: Diagnosis present

## 2014-11-05 DIAGNOSIS — E119 Type 2 diabetes mellitus without complications: Secondary | ICD-10-CM | POA: Diagnosis present

## 2014-11-05 DIAGNOSIS — M5126 Other intervertebral disc displacement, lumbar region: Principal | ICD-10-CM | POA: Diagnosis present

## 2014-11-05 DIAGNOSIS — I10 Essential (primary) hypertension: Secondary | ICD-10-CM | POA: Diagnosis present

## 2014-11-05 DIAGNOSIS — K219 Gastro-esophageal reflux disease without esophagitis: Secondary | ICD-10-CM | POA: Diagnosis present

## 2014-11-05 DIAGNOSIS — Z8673 Personal history of transient ischemic attack (TIA), and cerebral infarction without residual deficits: Secondary | ICD-10-CM

## 2014-11-05 DIAGNOSIS — M1990 Unspecified osteoarthritis, unspecified site: Secondary | ICD-10-CM | POA: Diagnosis present

## 2014-11-05 DIAGNOSIS — Z794 Long term (current) use of insulin: Secondary | ICD-10-CM

## 2014-11-05 DIAGNOSIS — J45909 Unspecified asthma, uncomplicated: Secondary | ICD-10-CM | POA: Diagnosis present

## 2014-11-05 HISTORY — PX: LUMBAR LAMINECTOMY: SHX95

## 2014-11-05 LAB — GLUCOSE, CAPILLARY
Glucose-Capillary: 134 mg/dL — ABNORMAL HIGH (ref 70–99)
Glucose-Capillary: 112 mg/dL — ABNORMAL HIGH (ref 70–99)
Glucose-Capillary: 119 mg/dL — ABNORMAL HIGH (ref 70–99)

## 2014-11-05 SURGERY — MICRODISCECTOMY LUMBAR LAMINECTOMY
Anesthesia: General

## 2014-11-05 MED ORDER — MORPHINE SULFATE 2 MG/ML IJ SOLN: 1.0000 mg | INTRAMUSCULAR | Status: DC | PRN

## 2014-11-05 MED ORDER — PROPOFOL 10 MG/ML IV BOLUS
INTRAVENOUS | Status: DC | PRN
  Administered 2014-11-05: 200 mg via INTRAVENOUS

## 2014-11-05 MED ORDER — SODIUM CHLORIDE 0.9 % IJ SOLN: 3.0000 mL | INTRAMUSCULAR | Status: DC | PRN

## 2014-11-05 MED ORDER — ACETAMINOPHEN 325 MG PO TABS
650.0000 mg | ORAL_TABLET | ORAL | Status: DC | PRN
  Administered 2014-11-05: 650 mg via ORAL
  Filled 2014-11-05: qty 2

## 2014-11-05 MED ORDER — ACETAMINOPHEN 10 MG/ML IV SOLN
1000.0000 mg | INTRAVENOUS | Status: AC
  Administered 2014-11-05: 1000 mg via INTRAVENOUS
  Filled 2014-11-05: qty 100

## 2014-11-05 MED ORDER — CEFAZOLIN SODIUM-DEXTROSE 2-3 GM-% IV SOLR
2.0000 g | Freq: Three times a day (TID) | INTRAVENOUS | Status: AC
  Administered 2014-11-05 (×2): 2 g via INTRAVENOUS
  Filled 2014-11-05 (×2): qty 50

## 2014-11-05 MED ORDER — LIDOCAINE HCL (CARDIAC) 20 MG/ML IV SOLN
INTRAVENOUS | Status: DC | PRN
  Administered 2014-11-05: 100 mg via INTRAVENOUS

## 2014-11-05 MED ORDER — 0.9 % SODIUM CHLORIDE (POUR BTL) OPTIME
TOPICAL | Status: DC | PRN
  Administered 2014-11-05: 1000 mL

## 2014-11-05 MED ORDER — CLOPIDOGREL BISULFATE 75 MG PO TABS
75.0000 mg | ORAL_TABLET | Freq: Every day | ORAL | Status: DC
  Administered 2014-11-07: 75 mg via ORAL
  Filled 2014-11-05 (×2): qty 1

## 2014-11-05 MED ORDER — BUPIVACAINE LIPOSOME 1.3 % IJ SUSP
20.0000 mL | Freq: Once | INTRAMUSCULAR | Status: DC
  Filled 2014-11-05: qty 20

## 2014-11-05 MED ORDER — LIDOCAINE HCL (CARDIAC) 20 MG/ML IV SOLN
INTRAVENOUS | Status: AC
  Filled 2014-11-05: qty 5

## 2014-11-05 MED ORDER — FENTANYL CITRATE 0.05 MG/ML IJ SOLN
INTRAMUSCULAR | Status: AC
  Filled 2014-11-05: qty 2

## 2014-11-05 MED ORDER — MIDAZOLAM HCL 5 MG/5ML IJ SOLN
INTRAMUSCULAR | Status: DC | PRN
  Administered 2014-11-05 (×2): 1 mg via INTRAVENOUS

## 2014-11-05 MED ORDER — HYDROCODONE-ACETAMINOPHEN 5-325 MG PO TABS
1.0000 | ORAL_TABLET | ORAL | Status: DC | PRN
  Administered 2014-11-06: 2 via ORAL
  Filled 2014-11-05: qty 2

## 2014-11-05 MED ORDER — BISACODYL 5 MG PO TBEC: 5.0000 mg | DELAYED_RELEASE_TABLET | Freq: Every day | ORAL | Status: DC | PRN

## 2014-11-05 MED ORDER — ONDANSETRON HCL 4 MG/2ML IJ SOLN
INTRAMUSCULAR | Status: DC | PRN
Start: 2014-11-05 — End: 2014-11-05
  Administered 2014-11-05: 4 mg via INTRAVENOUS

## 2014-11-05 MED ORDER — FLUTICASONE PROPIONATE 50 MCG/ACT NA SUSP
2.0000 | Freq: Every day | NASAL | Status: DC
  Administered 2014-11-06 – 2014-11-07 (×2): 2 via NASAL
  Filled 2014-11-05: qty 16

## 2014-11-05 MED ORDER — DOCUSATE SODIUM 100 MG PO CAPS
100.0000 mg | ORAL_CAPSULE | Freq: Two times a day (BID) | ORAL | Status: DC
  Administered 2014-11-05 – 2014-11-07 (×5): 100 mg via ORAL
  Filled 2014-11-05 (×5): qty 1

## 2014-11-05 MED ORDER — BUPIVACAINE HCL 0.5 % IJ SOLN
INTRAMUSCULAR | Status: DC | PRN
  Administered 2014-11-05: 27 mL

## 2014-11-05 MED ORDER — SUCCINYLCHOLINE CHLORIDE 20 MG/ML IJ SOLN
INTRAMUSCULAR | Status: DC | PRN
  Administered 2014-11-05: 140 mg via INTRAVENOUS

## 2014-11-05 MED ORDER — THROMBIN 20000 UNITS EX SOLR
CUTANEOUS | Status: AC
  Filled 2014-11-05: qty 20000

## 2014-11-05 MED ORDER — EPHEDRINE SULFATE 50 MG/ML IJ SOLN
INTRAMUSCULAR | Status: DC | PRN
  Administered 2014-11-05: 10 mg via INTRAVENOUS

## 2014-11-05 MED ORDER — GLYCOPYRROLATE 0.2 MG/ML IJ SOLN
INTRAMUSCULAR | Status: AC
  Filled 2014-11-05: qty 4

## 2014-11-05 MED ORDER — METHOCARBAMOL 750 MG PO TABS
750.0000 mg | ORAL_TABLET | Freq: Four times a day (QID) | ORAL | Status: DC | PRN
Start: 2014-11-05 — End: 2014-11-05

## 2014-11-05 MED ORDER — ALUM & MAG HYDROXIDE-SIMETH 200-200-20 MG/5ML PO SUSP: 30.0000 mL | Freq: Four times a day (QID) | ORAL | Status: DC | PRN

## 2014-11-05 MED ORDER — THROMBIN 20000 UNITS EX KIT
PACK | CUTANEOUS | Status: DC | PRN
  Administered 2014-11-05: 20 mL via TOPICAL

## 2014-11-05 MED ORDER — DOCUSATE SODIUM 100 MG PO CAPS: 100.0000 mg | ORAL_CAPSULE | Freq: Every day | ORAL | Status: DC

## 2014-11-05 MED ORDER — BISACODYL 10 MG RE SUPP
10.0000 mg | Freq: Once | RECTAL | Status: AC
  Administered 2014-11-05: 10 mg via RECTAL
  Filled 2014-11-05: qty 1

## 2014-11-05 MED ORDER — PHENOL 1.4 % MT LIQD: 1.0000 | OROMUCOSAL | Status: DC | PRN

## 2014-11-05 MED ORDER — POLYETHYLENE GLYCOL 3350 17 G PO PACK
17.0000 g | PACK | Freq: Every day | ORAL | Status: DC | PRN
  Administered 2014-11-05: 17 g via ORAL
  Filled 2014-11-05: qty 1

## 2014-11-05 MED ORDER — TIOTROPIUM BROMIDE MONOHYDRATE 18 MCG IN CAPS
18.0000 ug | ORAL_CAPSULE | Freq: Every day | RESPIRATORY_TRACT | Status: DC
  Administered 2014-11-06: 18 ug via RESPIRATORY_TRACT
  Filled 2014-11-05: qty 5

## 2014-11-05 MED ORDER — OXYMETAZOLINE HCL 0.05 % NA SOLN
1.0000 | Freq: Two times a day (BID) | NASAL | Status: DC | PRN
  Filled 2014-11-05: qty 15

## 2014-11-05 MED ORDER — ROCURONIUM BROMIDE 50 MG/5ML IV SOLN
INTRAVENOUS | Status: AC
  Filled 2014-11-05: qty 1

## 2014-11-05 MED ORDER — LOSARTAN POTASSIUM 50 MG PO TABS
25.0000 mg | ORAL_TABLET | Freq: Every day | ORAL | Status: DC
  Administered 2014-11-06: 25 mg via ORAL
  Filled 2014-11-05: qty 1

## 2014-11-05 MED ORDER — DEXMEDETOMIDINE HCL IN NACL 200 MCG/50ML IV SOLN
INTRAVENOUS | Status: AC
  Filled 2014-11-05: qty 50

## 2014-11-05 MED ORDER — SODIUM CHLORIDE 0.9 % IV SOLN: 250.0000 mL | INTRAVENOUS | Status: DC

## 2014-11-05 MED ORDER — ONDANSETRON HCL 4 MG/2ML IJ SOLN
INTRAMUSCULAR | Status: AC
  Filled 2014-11-05: qty 2

## 2014-11-05 MED ORDER — KETAMINE HCL 100 MG/ML IJ SOLN
INTRAMUSCULAR | Status: AC
  Filled 2014-11-05: qty 1

## 2014-11-05 MED ORDER — DARIFENACIN HYDROBROMIDE ER 15 MG PO TB24
15.0000 mg | ORAL_TABLET | Freq: Every day | ORAL | Status: DC
  Administered 2014-11-05 – 2014-11-07 (×3): 15 mg via ORAL
  Filled 2014-11-05 (×3): qty 1

## 2014-11-05 MED ORDER — METHOCARBAMOL 1000 MG/10ML IJ SOLN
500.0000 mg | Freq: Four times a day (QID) | INTRAVENOUS | Status: DC | PRN
  Filled 2014-11-05: qty 5

## 2014-11-05 MED ORDER — MEPERIDINE HCL 25 MG/ML IJ SOLN: 6.2500 mg | INTRAMUSCULAR | Status: DC | PRN

## 2014-11-05 MED ORDER — GLYCOPYRROLATE 0.2 MG/ML IJ SOLN
INTRAMUSCULAR | Status: DC | PRN
  Administered 2014-11-05: .3 mg via INTRAVENOUS
  Administered 2014-11-05: .5 mg via INTRAVENOUS

## 2014-11-05 MED ORDER — CHLORHEXIDINE GLUCONATE 4 % EX LIQD: 60.0000 mL | Freq: Once | CUTANEOUS | Status: DC

## 2014-11-05 MED ORDER — TAMSULOSIN HCL 0.4 MG PO CAPS
0.4000 mg | ORAL_CAPSULE | Freq: Every day | ORAL | Status: DC
  Administered 2014-11-05 – 2014-11-07 (×3): 0.4 mg via ORAL
  Filled 2014-11-05 (×3): qty 1

## 2014-11-05 MED ORDER — SUCCINYLCHOLINE CHLORIDE 20 MG/ML IJ SOLN
INTRAMUSCULAR | Status: AC
  Filled 2014-11-05: qty 1

## 2014-11-05 MED ORDER — METHOCARBAMOL 500 MG PO TABS: 500.0000 mg | ORAL_TABLET | Freq: Four times a day (QID) | ORAL | Status: DC | PRN

## 2014-11-05 MED ORDER — GABAPENTIN 100 MG PO CAPS: 100.0000 mg | ORAL_CAPSULE | Freq: Three times a day (TID) | ORAL | Status: DC | PRN

## 2014-11-05 MED ORDER — ARTIFICIAL TEARS OP OINT
TOPICAL_OINTMENT | OPHTHALMIC | Status: AC
  Filled 2014-11-05: qty 3.5

## 2014-11-05 MED ORDER — ONDANSETRON HCL 4 MG/2ML IJ SOLN: 4.0000 mg | INTRAMUSCULAR | Status: DC | PRN

## 2014-11-05 MED ORDER — EPHEDRINE SULFATE 50 MG/ML IJ SOLN
INTRAMUSCULAR | Status: AC
  Filled 2014-11-05: qty 1

## 2014-11-05 MED ORDER — INSULIN DETEMIR 100 UNIT/ML ~~LOC~~ SOLN
16.0000 [IU] | Freq: Every morning | SUBCUTANEOUS | Status: DC
  Administered 2014-11-05 – 2014-11-07 (×3): 16 [IU] via SUBCUTANEOUS
  Filled 2014-11-05 (×3): qty 0.16

## 2014-11-05 MED ORDER — ROCURONIUM BROMIDE 100 MG/10ML IV SOLN
INTRAVENOUS | Status: DC | PRN
  Administered 2014-11-05: 40 mg via INTRAVENOUS

## 2014-11-05 MED ORDER — FUROSEMIDE 40 MG PO TABS
40.0000 mg | ORAL_TABLET | Freq: Every day | ORAL | Status: DC
  Filled 2014-11-05 (×2): qty 1

## 2014-11-05 MED ORDER — PROPOFOL 10 MG/ML IV BOLUS
INTRAVENOUS | Status: AC
  Filled 2014-11-05: qty 20

## 2014-11-05 MED ORDER — OXYCODONE-ACETAMINOPHEN 7.5-325 MG PO TABS: 2.0000 | ORAL_TABLET | Freq: Four times a day (QID) | ORAL | Status: DC | PRN

## 2014-11-05 MED ORDER — BUPIVACAINE LIPOSOME 1.3 % IJ SUSP
20.0000 mL | Freq: Once | INTRAMUSCULAR | Status: AC
  Administered 2014-11-05: 27 mL
  Filled 2014-11-05: qty 20

## 2014-11-05 MED ORDER — METHOCARBAMOL 500 MG PO TABS
500.0000 mg | ORAL_TABLET | Freq: Four times a day (QID) | ORAL | Status: DC | PRN
  Administered 2014-11-05: 500 mg via ORAL
  Filled 2014-11-05: qty 1

## 2014-11-05 MED ORDER — LACTATED RINGERS IV SOLN
INTRAVENOUS | Status: DC | PRN
  Administered 2014-11-05 (×3): via INTRAVENOUS

## 2014-11-05 MED ORDER — FENTANYL CITRATE 0.05 MG/ML IJ SOLN
INTRAMUSCULAR | Status: AC
  Filled 2014-11-05: qty 5

## 2014-11-05 MED ORDER — HYDROXYZINE HCL 10 MG PO TABS
20.0000 mg | ORAL_TABLET | Freq: Every day | ORAL | Status: DC
  Administered 2014-11-05 – 2014-11-06 (×2): 20 mg via ORAL
  Filled 2014-11-05 (×3): qty 2

## 2014-11-05 MED ORDER — PROMETHAZINE HCL 25 MG/ML IJ SOLN: 6.2500 mg | INTRAMUSCULAR | Status: DC | PRN

## 2014-11-05 MED ORDER — BUDESONIDE-FORMOTEROL FUMARATE 160-4.5 MCG/ACT IN AERO
2.0000 | INHALATION_SPRAY | Freq: Two times a day (BID) | RESPIRATORY_TRACT | Status: DC
  Administered 2014-11-05 – 2014-11-06 (×3): 2 via RESPIRATORY_TRACT
  Filled 2014-11-05: qty 6

## 2014-11-05 MED ORDER — MIDAZOLAM HCL 2 MG/2ML IJ SOLN
INTRAMUSCULAR | Status: AC
  Filled 2014-11-05: qty 2

## 2014-11-05 MED ORDER — INSULIN ASPART 100 UNIT/ML ~~LOC~~ SOLN
0.0000 [IU] | SUBCUTANEOUS | Status: DC
  Administered 2014-11-06 – 2014-11-07 (×3): 3 [IU] via SUBCUTANEOUS

## 2014-11-05 MED ORDER — FENTANYL CITRATE 0.05 MG/ML IJ SOLN
INTRAMUSCULAR | Status: DC | PRN
  Administered 2014-11-05 (×3): 50 ug via INTRAVENOUS

## 2014-11-05 MED ORDER — SODIUM CHLORIDE 0.9 % IJ SOLN
3.0000 mL | Freq: Two times a day (BID) | INTRAMUSCULAR | Status: DC
  Administered 2014-11-05 – 2014-11-06 (×2): 3 mL via INTRAVENOUS

## 2014-11-05 MED ORDER — NEOSTIGMINE METHYLSULFATE 10 MG/10ML IV SOLN
INTRAVENOUS | Status: DC | PRN
  Administered 2014-11-05: 2 mg via INTRAVENOUS
  Administered 2014-11-05: 3 mg via INTRAVENOUS

## 2014-11-05 MED ORDER — FLEET ENEMA 7-19 GM/118ML RE ENEM
1.0000 | ENEMA | Freq: Once | RECTAL | Status: AC | PRN
  Administered 2014-11-05: 1 via RECTAL
  Filled 2014-11-05: qty 1

## 2014-11-05 MED ORDER — NEOSTIGMINE METHYLSULFATE 10 MG/10ML IV SOLN
INTRAVENOUS | Status: AC
  Filled 2014-11-05: qty 1

## 2014-11-05 MED ORDER — KETAMINE HCL 100 MG/ML IJ SOLN
INTRAMUSCULAR | Status: DC | PRN
  Administered 2014-11-05: 30 mg via INTRAVENOUS

## 2014-11-05 MED ORDER — FENTANYL CITRATE 0.05 MG/ML IJ SOLN
25.0000 ug | INTRAMUSCULAR | Status: DC | PRN
  Administered 2014-11-05: 50 ug via INTRAVENOUS

## 2014-11-05 MED ORDER — HYPROMELLOSE (GONIOSCOPIC) 2.5 % OP SOLN: 1.0000 [drp] | Freq: Three times a day (TID) | OPHTHALMIC | Status: DC | PRN

## 2014-11-05 MED ORDER — DUTASTERIDE 0.5 MG PO CAPS
0.5000 mg | ORAL_CAPSULE | Freq: Every day | ORAL | Status: DC
  Administered 2014-11-05 – 2014-11-07 (×3): 0.5 mg via ORAL
  Filled 2014-11-05 (×3): qty 1

## 2014-11-05 MED ORDER — ACETAMINOPHEN 650 MG RE SUPP: 650.0000 mg | RECTAL | Status: DC | PRN

## 2014-11-05 MED ORDER — DEXMEDETOMIDINE HCL 200 MCG/2ML IV SOLN
INTRAVENOUS | Status: DC | PRN
  Administered 2014-11-05 (×2): 10 ug via INTRAVENOUS

## 2014-11-05 MED ORDER — INSULIN DETEMIR 100 UNIT/ML ~~LOC~~ SOLN: 16.0000 [IU] | Freq: Every morning | SUBCUTANEOUS | Status: DC

## 2014-11-05 MED ORDER — POLYVINYL ALCOHOL 1.4 % OP SOLN
1.0000 [drp] | Freq: Three times a day (TID) | OPHTHALMIC | Status: DC | PRN
  Filled 2014-11-05: qty 15

## 2014-11-05 MED ORDER — OXYCODONE-ACETAMINOPHEN 5-325 MG PO TABS
1.0000 | ORAL_TABLET | ORAL | Status: DC | PRN
  Administered 2014-11-05: 1 via ORAL
  Administered 2014-11-06 (×3): 2 via ORAL
  Administered 2014-11-06: 1 via ORAL
  Administered 2014-11-06 – 2014-11-07 (×2): 2 via ORAL
  Filled 2014-11-05 (×2): qty 2
  Filled 2014-11-05: qty 1
  Filled 2014-11-05 (×4): qty 2

## 2014-11-05 MED ORDER — DEXTROSE 5 % IV SOLN
INTRAVENOUS | Status: DC | PRN
  Administered 2014-11-05: 08:00:00 via INTRAVENOUS

## 2014-11-05 MED ORDER — POTASSIUM CHLORIDE IN NACL 20-0.45 MEQ/L-% IV SOLN
INTRAVENOUS | Status: DC
  Filled 2014-11-05 (×2): qty 1000

## 2014-11-05 MED ORDER — MENTHOL 3 MG MT LOZG: 1.0000 | LOZENGE | OROMUCOSAL | Status: DC | PRN

## 2014-11-05 SURGICAL SUPPLY — 55 items
BUR MATCHSTICK NEURO 3.0 LAGG (BURR) ×3 IMPLANT
BUR ROUND FLUTED 4 SOFT TCH (BURR) IMPLANT
BUR ROUND FLUTED 4MM SOFT TCH (BURR)
CANISTER SUCTION 2500CC (MISCELLANEOUS) ×3 IMPLANT
CORDS BIPOLAR (ELECTRODE) IMPLANT
COVER MAYO STAND STRL (DRAPES) ×3 IMPLANT
COVER SURGICAL LIGHT HANDLE (MISCELLANEOUS) ×3 IMPLANT
DERMABOND ADVANCED (GAUZE/BANDAGES/DRESSINGS) ×2
DERMABOND ADVANCED .7 DNX12 (GAUZE/BANDAGES/DRESSINGS) ×1 IMPLANT
DRAPE C-ARM 42X72 X-RAY (DRAPES) ×3 IMPLANT
DRAPE MICROSCOPE LEICA (MISCELLANEOUS) ×3 IMPLANT
DRAPE POUCH INSTRU U-SHP 10X18 (DRAPES) ×3 IMPLANT
DRAPE PROXIMA HALF (DRAPES) ×3 IMPLANT
DRAPE SURG 17X23 STRL (DRAPES) ×12 IMPLANT
DRSG MEPILEX BORDER 4X4 (GAUZE/BANDAGES/DRESSINGS) ×3 IMPLANT
DRSG MEPILEX BORDER 4X8 (GAUZE/BANDAGES/DRESSINGS) IMPLANT
DURAPREP 26ML APPLICATOR (WOUND CARE) ×3 IMPLANT
ELECT BLADE 4.0 EZ CLEAN MEGAD (MISCELLANEOUS) ×3
ELECT CAUTERY BLADE 6.4 (BLADE) ×3 IMPLANT
ELECT REM PT RETURN 9FT ADLT (ELECTROSURGICAL) ×3
ELECTRODE BLDE 4.0 EZ CLN MEGD (MISCELLANEOUS) ×1 IMPLANT
ELECTRODE REM PT RTRN 9FT ADLT (ELECTROSURGICAL) ×1 IMPLANT
GLOVE BIOGEL PI IND STRL 7.5 (GLOVE) ×1 IMPLANT
GLOVE BIOGEL PI INDICATOR 7.5 (GLOVE) ×2
GLOVE ECLIPSE 7.0 STRL STRAW (GLOVE) ×3 IMPLANT
GLOVE ECLIPSE 8.5 STRL (GLOVE) ×3 IMPLANT
GLOVE SURG 8.5 LATEX PF (GLOVE) ×3 IMPLANT
GOWN STRL REUS W/ TWL LRG LVL3 (GOWN DISPOSABLE) ×1 IMPLANT
GOWN STRL REUS W/TWL 2XL LVL3 (GOWN DISPOSABLE) ×6 IMPLANT
GOWN STRL REUS W/TWL LRG LVL3 (GOWN DISPOSABLE) ×2
GOWN STRL REUS W/TWL XL LVL3 (GOWN DISPOSABLE) IMPLANT
KIT BASIN OR (CUSTOM PROCEDURE TRAY) ×3 IMPLANT
KIT ROOM TURNOVER OR (KITS) ×3 IMPLANT
LIQUID BAND (GAUZE/BANDAGES/DRESSINGS) ×3 IMPLANT
NEEDLE 22X1 1/2 (OR ONLY) (NEEDLE) ×3 IMPLANT
NEEDLE SPNL 18GX3.5 QUINCKE PK (NEEDLE) ×6 IMPLANT
NS IRRIG 1000ML POUR BTL (IV SOLUTION) ×3 IMPLANT
PACK LAMINECTOMY ORTHO (CUSTOM PROCEDURE TRAY) ×3 IMPLANT
PAD ARMBOARD 7.5X6 YLW CONV (MISCELLANEOUS) ×9 IMPLANT
PATTIES SURGICAL .5 X.5 (GAUZE/BANDAGES/DRESSINGS) IMPLANT
PATTIES SURGICAL .75X.75 (GAUZE/BANDAGES/DRESSINGS) IMPLANT
SPONGE LAP 4X18 X RAY DECT (DISPOSABLE) IMPLANT
SPONGE SURGIFOAM ABS GEL 100 (HEMOSTASIS) IMPLANT
SUT VIC AB 1 CT1 27 (SUTURE) ×2
SUT VIC AB 1 CT1 27XBRD ANBCTR (SUTURE) ×1 IMPLANT
SUT VIC AB 2-0 CT1 27 (SUTURE) ×4
SUT VIC AB 2-0 CT1 TAPERPNT 27 (SUTURE) ×2 IMPLANT
SUT VICRYL 0 UR6 27IN ABS (SUTURE) IMPLANT
SUT VICRYL 4-0 PS2 18IN ABS (SUTURE) ×3 IMPLANT
SYR 20CC LL (SYRINGE) ×3 IMPLANT
SYR CONTROL 10ML LL (SYRINGE) ×3 IMPLANT
TOWEL OR 17X24 6PK STRL BLUE (TOWEL DISPOSABLE) ×3 IMPLANT
TOWEL OR 17X26 10 PK STRL BLUE (TOWEL DISPOSABLE) ×3 IMPLANT
TRAY FOLEY CATH 16FRSI W/METER (SET/KITS/TRAYS/PACK) IMPLANT
WATER STERILE IRR 1000ML POUR (IV SOLUTION) IMPLANT

## 2014-11-05 NOTE — Progress Notes (Signed)
RT setup CPAP w/patient home nasal mask on auto titrate max 20 cmH2O min 5 cmH2O.  RT filled water chamber for the patient and the patient stated that he would put the CPAP on with no assistance once he finished his taking medication. RT will continue to monitor.    11/05/14 2117  BiPAP/CPAP/SIPAP  BiPAP/CPAP/SIPAP Pt Type Adult  Mask Type Nasal mask (pt home mask)  Mask Size Small  Respiratory Rate 20 breaths/min  EPAP (auto max 20 cmH2O min 5 cmH2O)  Flow Rate 3 lpm  BiPAP/CPAP/SIPAP CPAP  Patient Home Equipment No (just home mask and tubing)  Auto Titrate Yes (auto max 20 cmH2O min 5 cmH2O)  BiPAP/CPAP /SiPAP Vitals  Pulse Rate 81  Resp 20  SpO2 95 %  Bilateral Breath Sounds Clear;Diminished

## 2014-11-05 NOTE — H&P (Signed)
Eddie Carter is an 73 y.o. male.   Chief Complaint: Low back and left leg pain HPI: 73 year old male with nearly 3 month history of back pain with radiation into his left lower extremity. Back with acute Onset the weekend of labor day when helping to lay a new carpet in his office at the [employer] [facility] [place]. A book case began  Falling and he caught it with onset of left back pain, then of the ensuing weeks pain worsened with radiation into the left  Thigh and calf. Pain is anterior left thigh and radiates into the left knee. No bowel or bladder difficulty. Pain with standing  Walking and also worsening with bending and stooping. Left leg has been weak with tendency for leg to give away. Seen  By my partner Dr. Erlinda Hong and underwent facet blocks with out relief of pain.  MRI show left L3-4 HNP with extruded fragments Causing left L3 and L4 nerve compression.  Past Medical History  Diagnosis Date  . COPD (chronic obstructive pulmonary disease)   . Shortness of breath   . Asthma   . Sleep apnea     uses a cpap  . Arthritis   . GERD (gastroesophageal reflux disease)     no meds  . History of GI bleed   . HOH (hard of hearing)   . Full dentures   . Traumatic tear of lateral meniscus of right knee     right knee  . Medial meniscus, posterior horn derangement     left knee  . Complication of anesthesia     after septoplasty and uvulectomy-was in icu- [facility] - 10 yrs. ago  . Stroke 1995    some speech and thought processes slow  . Diabetes mellitus without complication   . Headache     difficulty with headaches- 1950's , again in 1967, 2008- again problems with migrraines   . Cancer     face- basal cell  . Hypertension     Past Surgical History  Procedure Laterality Date  . Foot foreign body removal  1976    left foot -multiple pieces glass  . Appendectomy  1963  . Hemorroidectomy  1995  . Carpal tunnel release      left  . Colonoscopy    . Upper gi endoscopy    . Nasal  septum surgery  1995  . Uvulopalatopharyngoplasty (uppp)/tonsillectomy/septoplasty  1995    same time with septoplasty-ended up ICU almost trached  . Tonsillectomy    . Knee arthroscopy Bilateral 01/17/2013    Procedure: ARTHROSCOPY KNEE BILATERAL WITH MEDIAL AND LATERAL MENISECTOMIES;  Surgeon: Lorn Junes, MD;  Location: [facility];  Service: Orthopedics;  Laterality: Bilateral;  . Eye surgery      foreign body removed fr. eye, ? side     Family History  Problem Relation Age of Onset  . Cancer Mother   . Heart attack Father    Social History:  reports that he quit smoking about 18 years ago. His smoking use included Cigarettes. He has a 60 pack-year smoking history. He has never used smokeless tobacco. He reports that he drinks alcohol. He reports that he does not use illicit drugs.  Allergies:  Allergies  Allergen Reactions  . Asa [Aspirin] Shortness Of Breath  . Nsaids Other (See Comments)    Makes him bleed.    Medications Prior to Admission  Medication Sig Dispense Refill  . budesonide-formoterol (SYMBICORT) 160-4.5 MCG/ACT inhaler Inhale 2  puffs into the lungs 2 (two) times daily. 3 Inhaler 1  . Cholecalciferol (VITAMIN D) 1000 UNITS capsule Take 3,000 Units by mouth daily.    Marland Kitchen CINNAMON PO Take 1 tablet by mouth daily.    . clopidogrel (PLAVIX) 75 MG tablet Take 75 mg by mouth daily.    Marland Kitchen docusate sodium (COLACE) 100 MG capsule Take 100 mg by mouth daily.    Marland Kitchen dutasteride (AVODART) 0.5 MG capsule Take 0.5 mg by mouth daily.    . fish oil-omega-3 fatty acids 1000 MG capsule Take 1 g by mouth daily.     . furosemide (LASIX) 40 MG tablet Take 40 mg by mouth daily.    Marland Kitchen gabapentin (NEURONTIN) 100 MG capsule Take 100 mg by mouth 3 (three) times daily as needed (pain).    . hydrOXYzine (ATARAX/VISTARIL) 10 MG tablet Take 2 tablets by mouth at bedtime.    . insulin detemir (LEVEMIR) 100 UNIT/ML injection Inject 16 Units into the skin every morning.     Marland Kitchen  losartan (COZAAR) 25 MG tablet Take 25 mg by mouth daily.    . metFORMIN (GLUCOPHAGE) 500 MG tablet Take 500 mg by mouth daily.    . methocarbamol (ROBAXIN) 750 MG tablet Take 750-1,500 mg by mouth every 6 (six) hours as needed for muscle spasms.    . methylPREDNISolone (MEDROL) 8 MG tablet Take 8 mg by mouth daily.    Marland Kitchen oxyCODONE-acetaminophen (PERCOCET) 7.5-325 MG per tablet Take 1 tablet by mouth every 6 (six) hours as needed for pain.    Marland Kitchen oxymetazoline (AFRIN) 0.05 % nasal spray Place 1 spray into both nostrils 2 (two) times daily as needed for congestion.    . solifenacin (VESICARE) 10 MG tablet Take 10 mg by mouth daily.     . tamsulosin (FLOMAX) 0.4 MG CAPS capsule Take 1 capsule by mouth daily.    Marland Kitchen tiotropium (SPIRIVA) 18 MCG inhalation capsule Place 1 capsule (18 mcg total) into inhaler and inhale daily. 90 capsule 1  . fluticasone (FLONASE) 50 MCG/ACT nasal spray Place 2 sprays into both nostrils daily. (Patient taking differently: Place 2 sprays into both nostrils daily as needed for allergies. ) 16 g 5  . hydroxypropyl methylcellulose / hypromellose (ISOPTO TEARS / GONIOVISC) 2.5 % ophthalmic solution Place 1 drop into both eyes 3 (three) times daily as needed for dry eyes.      Results for orders placed or performed during the hospital encounter of 11/05/14 (from the past 48 hour(s))  Glucose, capillary     Status: Abnormal   Collection Time: 11/05/14  6:30 AM  Result Value Ref Range   Glucose-Capillary 134 (H) 70 - 99 mg/dL   No results found.  Review of Systems  Constitutional: Negative for fever, chills, weight loss, malaise/fatigue and diaphoresis.  HENT: Negative.  Negative for congestion, ear discharge, ear pain, hearing loss, nosebleeds, sore throat and tinnitus.   Eyes: Negative for blurred vision, double vision, photophobia, pain, discharge and redness.  Respiratory: Positive for shortness of breath. Negative for cough, hemoptysis, sputum production, wheezing and  stridor.   Cardiovascular: Negative for chest pain, palpitations, orthopnea, claudication, leg swelling and PND.  Gastrointestinal: Negative for heartburn, nausea, vomiting, abdominal pain, diarrhea, constipation, blood in stool and melena.  Genitourinary: Negative for dysuria, urgency, frequency, hematuria and flank pain.  Musculoskeletal: Positive for myalgias and back pain. Negative for joint pain, falls and neck pain.  Skin: Negative.  Negative for itching and rash.  Neurological: Positive for tingling, sensory  change, focal weakness and weakness. Negative for dizziness, tremors, speech change, seizures, loss of consciousness and headaches.  Endo/Heme/Allergies: Negative for environmental allergies and polydipsia. Does not bruise/bleed easily.  Psychiatric/Behavioral: Negative.     Blood pressure 140/75, pulse 57, temperature 97.7 F (36.5 C), temperature source Oral, resp. rate 18, height 6' (1.829 m), weight 140.161 kg (309 lb), SpO2 95 %. Physical Exam  Constitutional: He is oriented to person, place, and time. He appears well-developed and well-nourished. No distress.  HENT:  Head: Normocephalic and atraumatic.  Right Ear: External ear normal.  Left Ear: External ear normal.  Nose: Nose normal.  Mouth/Throat: Oropharynx is clear and moist. No oropharyngeal exudate.  Eyes: Conjunctivae and EOM are normal. Pupils are equal, round, and reactive to light. Right eye exhibits no discharge. Left eye exhibits no discharge. No scleral icterus.  Neck: Normal range of motion. Neck supple. No JVD present. No tracheal deviation present. No thyromegaly present.  Cardiovascular: Normal rate, regular rhythm, normal heart sounds and intact distal pulses.  Exam reveals no gallop and no friction rub.   No murmur heard. Respiratory: Effort normal and breath sounds normal. No stridor. No respiratory distress. He has no wheezes. He has no rales. He exhibits no tenderness.  GI: Soft. Bowel sounds are  normal. He exhibits no distension and no mass. There is no tenderness. There is no rebound and no guarding.  Musculoskeletal: He exhibits tenderness. He exhibits no edema.  Lymphadenopathy:    He has no cervical adenopathy.  Neurological: He is alert and oriented to person, place, and time. He displays abnormal reflex. No cranial nerve deficit. He exhibits normal muscle tone. Coordination normal.  Skin: Skin is warm and dry. No rash noted. He is not diaphoretic. No erythema. No pallor.  Psychiatric: He has a normal mood and affect. His behavior is normal. Judgment and thought content normal.  Orthopaedic Eval: Awake, alert, Morbidly Obese male in moderate distress. Left knee strength is 5-/5, left foot dorsiflexion weak at 4+/5 Positive left reverse SLR. Positive Dangling SLR on the left. Popliteal compression test is negative. Numbness in the left anterior and lateral thigh and left anterior leg below the knee.    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 Careful post operative management of pulmonary status.  Mayuri Staples E 11/05/2014, 7:18 AM

## 2014-11-05 NOTE — Progress Notes (Signed)
Patient was given 53mcg of fentanyl and 75mcg was wasted and witnessed by Dellie Burns RN

## 2014-11-05 NOTE — Anesthesia Procedure Notes (Signed)
Procedure Name: Intubation Date/Time: 11/05/2014 7:47 AM Performed by: Terrill Mohr Pre-anesthesia Checklist: Patient identified, Emergency Drugs available, Suction available and Patient being monitored Patient Re-evaluated:Patient Re-evaluated prior to inductionOxygen Delivery Method: Circle system utilized Preoxygenation: Pre-oxygenation with 100% oxygen Intubation Type: IV induction Ventilation: Mask ventilation without difficulty Laryngoscope Size: Mac and 4 (glidescope) Grade View: Grade II Tube type: Oral Tube size: 8.0 mm Number of attempts: 1 Airway Equipment and Method: Stylet and Video-laryngoscopy Placement Confirmation: ETT inserted through vocal cords under direct vision,  breath sounds checked- equal and bilateral and positive ETCO2 Secured at: 22 (cm at gum) cm Tube secured with: Tape Dental Injury: Teeth and Oropharynx as per pre-operative assessment

## 2014-11-05 NOTE — Anesthesia Postprocedure Evaluation (Signed)
  Anesthesia Post-op Note  Patient: Eddie Carter  Procedure(s) Performed: Procedure(s): LEFT L3-4 MICRODISCECTOMY (N/A)  Patient Location: PACU  Anesthesia Type:General  Level of Consciousness: awake and alert   Airway and Oxygen Therapy: Patient Spontanous Breathing and Patient connected to nasal cannula oxygen  Post-op Pain: mild  Post-op Assessment: Post-op Vital signs reviewed  Post-op Vital Signs: Reviewed and stable  Last Vitals:  Filed Vitals:   11/05/14 1045  BP: 139/80  Pulse: 64  Temp:   Resp: 14    Complications: No apparent anesthesia complications

## 2014-11-05 NOTE — Interval H&P Note (Signed)
History and Physical Interval Note:  11/05/2014 7:41 AM  Eddie Carter  has presented today for surgery, with the diagnosis of Left L3-4 herniated nucleus pulposus with sequestered fragments  The various methods of treatment have been discussed with the patient and family. After consideration of risks, benefits and other options for treatment, the patient has consented to  Procedure(s): LEFT L3-4 MICRODISCECTOMY (N/A) as a surgical intervention .  The patient's history has been reviewed, patient examined, no change in status, stable for surgery.  I have reviewed the patient's chart and labs.  Questions were answered to the patient's satisfaction.     NITKA,JAMES E

## 2014-11-05 NOTE — Transfer of Care (Signed)
Immediate Anesthesia Transfer of Care Note  Patient: Eddie Carter  Procedure(s) Performed: Procedure(s): LEFT L3-4 MICRODISCECTOMY (N/A)  Patient Location: PACU  Anesthesia Type:General  Level of Consciousness: awake and patient cooperative  Airway & Oxygen Therapy: Patient Spontanous Breathing and Patient connected to face mask oxygen  Post-op Assessment: Report given to PACU RN, Post -op Vital signs reviewed and stable and Patient moving all extremities  Post vital signs: Reviewed and stable  Complications: No apparent anesthesia complications

## 2014-11-05 NOTE — Op Note (Addendum)
11/05/2014  9:52 AM  PATIENT:  Eddie Carter  73 y.o. male  MRN: [id number]  OPERATIVE REPORT  PRE-OPERATIVE DIAGNOSIS:  Left L3-4 herniated nucleus pulposus with sequestered fragments  POST-OPERATIVE DIAGNOSIS:  Left L3-4 herniated nucleus pulposus with sequestered   PROCEDURE:  Procedure(s): LEFT L3-4 MICRODISCECTOMY    SURGEON:  Jessy Oto, MD     ASSISTANT:  Benjiman Core, PA-C  (Present throughout the entire procedure and necessary for completion of procedure in a timely manner)     ANESTHESIA:  General,supplemented with local marcaine 1/2% 1:1 exparel 1.3% total 30cc, Dr. Tresa Moore.    COMPLICATIONS:  None.    DRAINS: Foley to SD.   PROCEDURE:The patient was met in the holding area, and the appropriate Left Lumbar level L3-4 identified and marked with "x" and my initials.The patient was then transported to OR and was placed under general anesthesia without difficulty. The patient received appropriate preoperative antibiotic prophylaxis. The patient after intubation atraumatically was transferred to the operating room table, prone position, Wilson frame, sliding OR table. All pressure points were well padded. The arms in 90-90 well-padded at the elbows. Standard prep with Iodoform solution lower dorsal spine to the mid sacral segment. Draped in the usual manner clear Vi-Drape was used. Time-out procedure was called and correct. 2x 18-gauge spinal needle was then inserted at the expected L3-4 level. C-arm was draped sterilely to the field and used to identify the spinal needles positions. The upper needle was at the lower aspect of the lamina of L3. Skin superior to this was then infiltrated with local marcaine 1/2% 1:1 exparel 1.3%  total of 15 cc used. An incision approximately an inch inch and a half in length was then made through skin and subcutaneous layers in line with the left side of the expected midline just superior to the spinal needle entry point. An incision made into the  left lumbosacral fascia approximately an inch in length .  Smallest dilator was then introduced into the incision site and used to carefully form subperiosteal dissection of the paralumbar muscles off of the posterior lamina of the expected L3-4 level. Successive dilators were then carried up to the 11 mm size. The depth measured off of the dilators at about 80 mm and 80 mm retractors then placed on the scaffolding for the MIS equipment and guided over dilators down to and docking on the posterior aspect of the lamina at the expected L3-4 level. This was sterilely attached to the articulating arm and it's up right which had been attached the OR table sterilely. C-arm fluoroscopy was identified the dilators and the retractors at the appropriate level L3-4. The operating room microscope sterilely draped brought into the field. Under the operating room microscope, the L3-4 interspace carefully debrided the small amount of muscle attachment here and high-speed bur used to drill the medial aspect of the inferior articular process of L3 approximately 20%. A localization lateral C-arm view was obtained with Penfield 4 in the L3-4 facet. 2 mm Kerrison then used to enter the spinal canal over the superior aspect of the L4 lamina carefully using the Kerrison to debris the attachment as a curet. Foraminotomy was then performed over the left L4 nerve root. The medial 10% superior articular process of L4 and then resected using 2 mm Kerrison. This allowed for identification of the thecal sac. Penfield 4 was then used to carefully mobilize the thecal sac medially and the L4 nerve root identified within the lateral recess flattened over  the posterior aspect of the protruded disc. Carefully the lateral aspect of the L4 nerve root was identified and a Penfield 4 was used to mobilize the nerve medially such that the protruded disc was visible with microscope. Using a Penfield 4 for retraction and a 15 blade scalpel was used to  incise the posterior longitudinal ligament within the lateral recess on the left side longitudinally. Disc material was removed using micropituitary rongeurs and nerve hook nerve root and then more easily able to be mobilized medially and retracted using a right angle retractor. Further foraminotomies was performed over the L4 nerve root the nerve root was noted to be free without further compression. The nerve root able to be retracted along the medial aspect of the L4 pedicle and no disc material found to be present extending below the L3-4 disc.  Ligamentum flavum was further debrided superiorly to the level L3-4 disc. Had a moderate amount of further resection of the L3 lamina inferiorly and mediallywas performed. With this then the disc space at L3-4 was easily visualized and entry into the disc at the sided disc herniation was possible using a Penfield 4 intraoperative Lateral radiograph was used to identify the L3-4 disc with the Fircrest 4 In place just at the disc space. Micropituitary was used to further debride this material superficially from the posterior aspect of the intervertebral disc is posterior lateral aspect of the disc. Large amount of further disc material was found subligamentous extending laterally and superior to the L3-4 disc multiple free fragment were removed using micropituitary rongeurs and freed up using a Woodson into the left L3 neuroforamen additionally blunt Ball tip nerve roots were used to decompress the left L3 neuroforamen.  Ligamentum flavum was debrided and lateral recess along the medial aspect L3-4 facet no further decompression was necessary. Ball tip nerve probe was then able to carefully palpate the neuroforamen for L3 and L4 finding these to be well decompressed. The [place]ton as well as hockey-stick nerve probe were used to probe the left L3 neuroforamen demonstrating no further nerve compression. Bleeding was then controlled using thrombin-soaked Gelfoam small  cottonoids. Small amount of bleeding within the soft tissue mass the laminotomy area was controlled using bipolar electrocautery. Irrigation was carried out using copious amounts of irrigant solution. All Gelfoam were then removed. No significant active bleeding present at the time of removal. All instruments sponge counts were correct traction system was then carefully removed carefully rotating retractors with this withdrawal and only bipolar electrocautery of any small bleeders. Lumbodorsal fascia was then carefully approximated with interrupted 0 Vicryl sutures, UR 6 needle deep subcutaneous layers were approximated with interrupted 0 Vicryl sutures on UR 6 the appear subcutaneous layers approximated with interrupted 2-0 Vicryl sutures and the skin closed with a running subcutaneous stitch of 4-0 Vicryl. Dermabond was applied allowed to dry and then Mepilex bandage applied. Patient was then carefully returned to supine position on a stretcher, reactivated and extubated. He was then returned to recovery room in satisfactory condition. Benjiman Core PA-C perform the duties of assistant surgeon during this case. He was present from the beginning of the case to the end of the case assisting in transfer the patient from his stretcher to the OR table and back to the stretcher at the end of the case. Assisted in careful retraction and suction of the laminectomy site delicate neural structures operating under the operating room microscope. He performed closure of the incision from the fascia to the skin applying the dressing.  Antoine Vandermeulen E  11/05/2014, 9:52 AM

## 2014-11-05 NOTE — Brief Op Note (Addendum)
11/05/2014  9:45 AM  PATIENT:  Eddie Carter  73 y.o. male  PRE-OPERATIVE DIAGNOSIS:  Left L3-4 herniated nucleus pulposus with sequestered fragments  POST-OPERATIVE DIAGNOSIS:  Left L3-4 herniated nucleus pulposus with sequestered   PROCEDURE:  Procedure(s): LEFT L3-4 MICRODISCECTOMY (N/A)  SURGEON:  Surgeon(s) and Role:   Jessy Oto, MD - Primary  PHYSICIAN ASSISTANT:Alizeh Madril Owens,PA-C    ANESTHESIA:   local and general Dr. Tresa Moore.  EBL:  Total I/O In: 550 [I.V.:550] Out: 250 [Urine:100; Blood:150]  BLOOD ADMINISTERED:none  DRAINS: Urinary Catheter (Foley)   LOCAL MEDICATIONS USED:  MARCAINE1/2% 1:1 EXPAREL 1.3%, Amount: 40 ml   SPECIMEN:  No Specimen  DISPOSITION OF SPECIMEN:  N/A  COUNTS:  YES  TOURNIQUET:  * No tourniquets in log *  DICTATION: .Dragon Dictation  PLAN OF CARE: Admit for overnight observation  PATIENT DISPOSITION:  PACU - hemodynamically stable.   Delay start of Pharmacological VTE agent (>24hrs) due to surgical blood loss or risk of bleeding: yes

## 2014-11-06 ENCOUNTER — Encounter (HOSPITAL_COMMUNITY): Payer: Self-pay | Admitting: Specialist

## 2014-11-06 LAB — GLUCOSE, CAPILLARY
GLUCOSE-CAPILLARY: 100 mg/dL — AB (ref 70–99)
GLUCOSE-CAPILLARY: 130 mg/dL — AB (ref 70–99)
Glucose-Capillary: 103 mg/dL — ABNORMAL HIGH (ref 70–99)
Glucose-Capillary: 104 mg/dL — ABNORMAL HIGH (ref 70–99)
Glucose-Capillary: 117 mg/dL — ABNORMAL HIGH (ref 70–99)
Glucose-Capillary: 138 mg/dL — ABNORMAL HIGH (ref 70–99)

## 2014-11-06 LAB — HEMOGLOBIN A1C
Hgb A1c MFr Bld: 6.9 % — ABNORMAL HIGH (ref <5.7)
Mean Plasma Glucose: 151 mg/dL — ABNORMAL HIGH (ref <117)

## 2014-11-06 MED FILL — Thrombin For Soln 20000 Unit: CUTANEOUS | Qty: 1 | Status: AC

## 2014-11-06 NOTE — Clinical Social Work Psychosocial (Signed)
Clinical Social Work Department BRIEF PSYCHOSOCIAL ASSESSMENT 11/06/2014  Patient:  Eddie Carter, Eddie Carter     Account Number:  [id number]     Admit date:  11/05/2014  Clinical Social Worker:  Marciano Sequin  Date/Time:  11/06/2014 03:59 PM  Referred by:  RN  Date Referred:  11/06/2014 Referred for  SNF Placement   Other Referral:   Interview type:  Patient Other interview type:    PSYCHOSOCIAL DATA Living Status:  WIFE Admitted from facility:   Level of care:   Primary support name:  Hanna,Patricia Primary support relationship to patient:  SPOUSE Degree of support available:   Strong Support System    CURRENT CONCERNS Current Concerns  None Noted   Other Concerns:    SOCIAL WORK ASSESSMENT / PLAN CSW met the pt at the bedside. CSW introduce self and purpose of visit. CSW and pt discussed clinical recommendations for rehab. CSW and pt discussed the SNF process for rehab. CSW presented the pt with a SNF list. Pt reported needing to review the list with his wife. CSW provided the pt with contract information for further questions. CSW will continue to follow and assist this pt with discharge needs.   Assessment/plan status:  Psychosocial Support/Ongoing Assessment of Needs Other assessment/ plan:   Information/referral to community resources:    PATIENT'S/FAMILY'S RESPONSE TO PLAN OF CARE: Pt presented with a bright affect and up beat mood. Pt oriented 4x. Pt acknowledged needing additional help before he transition home, but expressed the need to talk to his wife first.    Greta Doom, MSW, [place]

## 2014-11-06 NOTE — Progress Notes (Signed)
Subjective: Doing well.  preop leg pain gone.  No voiding issues.  Denies cp, sob.     Objective: Vital signs in last 24 hours: Temp:  [97.5 F (36.4 C)-98.7 F (37.1 C)] 98.7 F (37.1 C) (12/15 0500) Pulse Rate:  [51-81] 70 (12/15 0500) Resp:  [12-20] 18 (12/15 0500) BP: (117-139)/(53-80) 133/61 mmHg (12/15 0500) SpO2:  [92 %-100 %] 93 % (12/15 0500)  Intake/Output from previous day: 12/14 0701 - 12/15 0700 In: 1405 [I.V.:1250] Out: 1025 [Urine:875; Blood:150] Intake/Output this shift:    No results for input(s): HGB in the last 72 hours. No results for input(s): WBC, RBC, HCT, PLT in the last 72 hours. No results for input(s): NA, K, CL, CO2, BUN, CREATININE, GLUCOSE, CALCIUM in the last 72 hours. No results for input(s): LABPT, INR in the last 72 hours.  Exam:  Dressing C/D/I.  bilat calves nontender.  NVI.  bilat ehl, ant tib, gastroc are strong.    Assessment/Plan: PT today.  Possible d/c home today or tomorrow depending on his progress.     Itali Mckendry M 11/06/2014, 8:08 AM

## 2014-11-06 NOTE — Progress Notes (Signed)
INITIAL NUTRITION ASSESSMENT  DOCUMENTATION CODES Per approved criteria  -Morbid Obesity   INTERVENTION: Encourage adequate healthful PO intake Encourage intake of protein rich foods RD to continue to monitor for PO adequacy  NUTRITION DIAGNOSIS: Inadequate oral intake related to poor appetite as evidenced by pt's report and 5% weight loss in less than 2 weeks.   Goal: Pt to meet >/= 90% of their estimated nutrition needs   Monitor:  PO intake, weight trend, labs  Reason for Assessment: Malnutrition Screening Tool, score of 2  73 y.o. male  Admitting Dx: HNP (herniated nucleus pulposus), lumbar  ASSESSMENT: 37 male s/p L3-4 microdiscectomy. PMH: COPD, SOB, Asthma, GERD, arthritis, GIB, HOH, R knee lateral meniscus, L medial meniscus, CVA, HA, DM, HTN.  Patient states that he lost 15 lbs within 12 days just PTA due to poor appetite. He reports going some days without eating and other days he was still only eating about 50% compared to usual. He reports that his appetite has improved the past couple days and he is eating 50% of his meals. He is glad his appetite hasn't quite returned to normal as he wants to continue losing weight.  RD encouraged patient to eat 3 meals daily with protein rich foods at each meal. Discouraged going whole days without eating. Encouraged adequate healthful PO intake with healthy weight loss of 1 lb per week. Patient denies any nutritional needs at this time, he feels he is eating well enough to keep his strength up and heal well from surgery.   Labs reviewed.   Height: Ht Readings from Last 1 Encounters:  11/05/14 6' (1.829 m)    Weight: Wt Readings from Last 1 Encounters:  11/05/14 309 lb (140.161 kg)    Ideal Body Weight: 178 lbs  % Ideal Body Weight: 174%  Wt Readings from Last 10 Encounters:  11/05/14 309 lb (140.161 kg)  11/02/14 309 lb 4.9 oz (140.3 kg)  10/25/13 340 lb (154.223 kg)  09/25/13 309 lb (140.161 kg)  08/22/13 330 lb  (149.687 kg)  01/17/13 330 lb (149.687 kg)    Usual Body Weight: 324 lbs  % Usual Body Weight: 95%  BMI:  Body mass index is 41.9 kg/(m^2).  Estimated Nutritional Needs: Kcal: 2100-2300 Protein: 130-145 grams Fluid: 2.8 L/day  Skin: closed incision on back  Diet Order: Diet Carb Modified Diet Carb Modified  EDUCATION NEEDS: -No education needs identified at this time   Intake/Output Summary (Last 24 hours) at 11/06/14 1442 Last data filed at 11/06/14 0501  Gross per 24 hour  Intake    155 ml  Output    525 ml  Net   -370 ml    Last BM: 12/14  Labs:   Recent Labs Lab 11/02/14 1448  NA 137  K 4.6  CL 99  CO2 25  BUN 17  CREATININE 0.88  CALCIUM 9.5  GLUCOSE 122*    CBG (last 3)   Recent Labs  11/06/14 0407 11/06/14 0753 11/06/14 1124  GLUCAP 104* 103* 117*    Scheduled Meds: . budesonide-formoterol  2 puff Inhalation BID  . clopidogrel  75 mg Oral Daily  . darifenacin  15 mg Oral Daily  . docusate sodium  100 mg Oral BID  . dutasteride  0.5 mg Oral Daily  . fluticasone  2 spray Each Nare Daily  . furosemide  40 mg Oral Daily  . hydrOXYzine  20 mg Oral QHS  . insulin aspart  0-20 Units Subcutaneous 6 times per day  .  insulin detemir  16 Units Subcutaneous q morning - 10a  . losartan  25 mg Oral Daily  . sodium chloride  3 mL Intravenous Q12H  . tamsulosin  0.4 mg Oral Daily  . tiotropium  18 mcg Inhalation Daily    Continuous Infusions:   Past Medical History  Diagnosis Date  . COPD (chronic obstructive pulmonary disease)   . Shortness of breath   . Asthma   . Sleep apnea     uses a cpap  . Arthritis   . GERD (gastroesophageal reflux disease)     no meds  . History of GI bleed   . HOH (hard of hearing)   . Full dentures   . Traumatic tear of lateral meniscus of right knee     right knee  . Medial meniscus, posterior horn derangement     left knee  . Complication of anesthesia     after septoplasty and uvulectomy-was in icu-  [facility] - 10 yrs. ago  . Stroke 1995    some speech and thought processes slow  . Diabetes mellitus without complication   . Headache     difficulty with headaches- 1950's , again in 1967, 2008- again problems with migrraines   . Cancer     face- basal cell  . Hypertension     Past Surgical History  Procedure Laterality Date  . Foot foreign body removal  1976    left foot -multiple pieces glass  . Appendectomy  1963  . Hemorroidectomy  1995  . Carpal tunnel release      left  . Colonoscopy    . Upper gi endoscopy    . Nasal septum surgery  1995  . Uvulopalatopharyngoplasty (uppp)/tonsillectomy/septoplasty  1995    same time with septoplasty-ended up ICU almost trached  . Tonsillectomy    . Knee arthroscopy Bilateral 01/17/2013    Procedure: ARTHROSCOPY KNEE BILATERAL WITH MEDIAL AND LATERAL MENISECTOMIES;  Surgeon: Lorn Junes, MD;  Location: [facility];  Service: Orthopedics;  Laterality: Bilateral;  . Eye surgery      foreign body removed fr. eye, ? side   . Lumbar laminectomy N/A 11/05/2014    Procedure: LEFT L3-4 MICRODISCECTOMY;  Surgeon: Jessy Oto, MD;  Location: [facility];  Service: Orthopedics;  Laterality: N/A;    Pryor Ochoa RD, LDN Inpatient Clinical Dietitian Pager: [phone] After Hours Pager: [phone]

## 2014-11-06 NOTE — Progress Notes (Signed)
Pt places his self on and off CPAP. He was told to call the RT if he needs help later.

## 2014-11-06 NOTE — Clinical Social Work Placement (Addendum)
Clinical Social Work Department CLINICAL SOCIAL WORK PLACEMENT NOTE 11/06/2014  Patient:  Eddie Carter, Eddie Carter  Account Number:  [id number] Admit date:  11/05/2014  Clinical Social Worker:  Greta Doom, LCSWA  Date/time:  11/06/2014 04:05 PM  Clinical Social Work is seeking post-discharge placement for this patient at the following level of care:   SKILLED NURSING   (*CSW will update this form in Epic as items are completed)   11/06/2014  Patient/family provided with [facility] Department of Clinical Social Work's list of facilities offering this level of care within the geographic area requested by the patient (or if unable, by the patient's family).  11/06/2014  Patient/family informed of their freedom to choose among providers that offer the needed level of care, that participate in Medicare, Medicaid or managed care program needed by the patient, have an available bed and are willing to accept the patient.  11/06/2014  Patient/family informed of [facility]' ownership interest in [facility], as well as of the fact that they are under no obligation to receive care at this facility.  PASARR submitted to EDS on 11/06/2014 PASARR number received on 11/06/2014  FL2 transmitted to all facilities in geographic area requested by pt/family on  11/06/2014 FL2 transmitted to all facilities within larger geographic area on 11/06/2014  Patient informed that his/her managed care company has contracts with or will negotiate with  certain facilities, including the following:     Patient/family informed of bed offers received:  11/07/2014 Patient chooses bed at [facility]  Physician recommends and patient chooses bed at    Patient to be transferred to [facility] on 11/07/2014  Patient to be transferred to facility by [facility] Patient and family notified of transfer on 11/07/2014 Name of family member notified:  Pt reported he will notify his wife.   The following physician  request were entered in Epic:   Additional Comments:   [place], MSW, [place]

## 2014-11-06 NOTE — Progress Notes (Signed)
Occupational Therapy Evaluation Patient Details Name: Eddie Carter MRN: [id number] DOB: [birth year] Today's Date: 11/06/2014    History of Present Illness 32 male s/p L3-4 microdiscectomy. PMH: COPD, SOB, Asthma, Gerd, arthritis, GIB, full dentures, HOH, R knee lateral meniscus, L medial meniscus, CVA, HA, DM, HTN, carpal tunnel release, eye surg, nasal septum surg   Clinical Impression   Patient is s/p above surgery resulting in functional limitations due to the deficits listed below (see OT problem list). Pt reluctant to participate in therapy today. Pt anxious about falling again. Pt educated and practiced with AE for LB ADLs. Wife expressed concern that the pt will not get out of bed at home unless HHOT/PT come and work with him. At this time I do not think the pt will qualify for [facility] services. Patient will benefit from skilled OT acutely to increase independence and safety with ADLS to allow discharge home.    Follow Up Recommendations  No OT follow up;Supervision/Assistance - 24 hour    Equipment Recommendations  Other (comment) (Hip kit to be purchased by pt)    Recommendations for Other Services       Precautions / Restrictions Precautions Precautions: Back Precaution Booklet Issued: Yes (comment) Precaution Comments: Back handout provided and reviewed in detail Restrictions Weight Bearing Restrictions: No      Mobility Bed Mobility Overal bed mobility: Needs Assistance Bed Mobility: Rolling;Sidelying to Sit Rolling: Min guard Sidelying to sit: Min assist       General bed mobility comments: HOB flat; use of bedrails. Verbal cues for log roll sequence. Min (A) to elevate trunk into upright position.  Transfers Overall transfer level: Needs assistance Equipment used: Rolling walker (2 wheeled) Transfers: Sit to/from Stand Sit to Stand: Min assist         General transfer comment: Min (A) for boost to stand and to stabilize RW. Verbal cues for safe hand  placement.    Balance Overall balance assessment: Needs assistance Sitting-balance support: No upper extremity supported;Feet supported Sitting balance-Leahy Scale: Fair     Standing balance support: Bilateral upper extremity supported;During functional activity Standing balance-Leahy Scale: Poor                              ADL Overall ADL's : Needs assistance/impaired                     Lower Body Dressing: Supervision/safety;With adaptive equipment;Adhering to back precautions;Sit to/from stand   Toilet Transfer: Min guard;Cueing for safety;Ambulation;Regular Toilet;RW   Toileting- Water quality scientist and Hygiene: Min guard;Sit to/from stand;Adhering to back precautions       Functional mobility during ADLs: Min guard;Rolling walker General ADL Comments: Pt unable to verbalize back precautions at beginning of session, but able to recall 2/3 at the end. Upon standing from EOB, pt reported severe dizziness (pt was spinning) that began to resolve after ~30 seconds. Pt educated  and practiced with AE for LB adls. Min guard (A) for transfer and mobility due to pt's reported dizziness.      Vision                 Additional Comments: Pt reports having diplopia yesterday, but was unable to verbalize when or how it looked. Pt reports it has resolved today.   Perception     Praxis      Pertinent Vitals/Pain Pain Assessment: 0-10 Pain Score: 1  Pain Location: back Pain Descriptors /  Indicators: Sore Pain Intervention(s): Monitored during session;Repositioned     Hand Dominance Right   Extremity/Trunk Assessment Upper Extremity Assessment Upper Extremity Assessment: Generalized weakness   Lower Extremity Assessment Lower Extremity Assessment: Defer to PT evaluation   Cervical / Trunk Assessment Cervical / Trunk Assessment: Normal   Communication Communication Communication: No difficulties   Cognition Arousal/Alertness:  Awake/alert Behavior During Therapy: Flat affect Overall Cognitive Status: Within Functional Limits for tasks assessed       Memory: Decreased recall of precautions             General Comments       Exercises       Shoulder Instructions      Home Living Family/patient expects to be discharged to:: Private residence Living Arrangements: Spouse/significant other Available Help at Discharge: Family;Available 24 hours/day Type of Home: House Home Access: Stairs to enter [employer] of Steps: 4 Entrance Stairs-Rails: Right;Left;Can reach both Home Layout: One level     Bathroom Shower/Tub: Walk-in shower;Door   Bathroom Toilet: Handicapped height     Home Equipment: Environmental consultant - 2 wheels;Cane - single point;Bedside commode;Shower seat;Grab bars - toilet;Grab bars - tub/shower;Hand held shower head          Prior Functioning/Environment Level of Independence: Independent with assistive device(s)        Comments: Pt using RW for mobility. 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    OT Diagnosis: Generalized weakness;Acute pain   OT Problem List: Decreased strength;Decreased activity tolerance;Impaired balance (sitting and/or standing);Decreased safety awareness;Decreased knowledge of use of DME or AE;Decreased knowledge of precautions;Obesity;Pain   OT Treatment/Interventions: Self-care/ADL training;Therapeutic exercise;DME and/or AE instruction;Therapeutic activities;Patient/family education;Balance training    OT Goals(Current goals can be found in the care plan section) Acute Rehab OT Goals Patient Stated Goal: to go back to work OT Goal Formulation: With patient Time For Goal Achievement: 11/20/14 Potential to Achieve Goals: Good ADL Goals Pt Will Transfer to Toilet: with modified independence;ambulating;regular height toilet;grab bars Pt Will Perform Toileting - Clothing Manipulation and hygiene: with modified independence;sit to/from stand Pt  Will Perform Tub/Shower Transfer: Shower transfer;with supervision;ambulating;shower seat;grab bars;rolling walker Additional ADL Goal #1: Pt will verbalize 3/3 back precautions with 100% accuracy prior to discharge.  OT Frequency: Min 2X/week   Barriers to D/C:            Co-evaluation              End of Session Equipment Utilized During Treatment: Gait belt;Rolling walker Nurse Communication: Mobility status;Precautions  Activity Tolerance: Patient tolerated treatment well Patient left: in chair;with call bell/phone within reach;with chair alarm set;with nursing/sitter in room;with family/visitor present   Time: 6063-0160 OT Time Calculation (min): 41 min Charges:    G-Codes:    Redmond Baseman November 25, 2014, 11:26 AM

## 2014-11-06 NOTE — Evaluation (Signed)
Physical Therapy Evaluation Patient Details Name: Eddie Carter MRN: [id number] DOB: 1941-07-17 Today's Date: 11/06/2014   History of Present Illness  27 male s/p L3-4 microdiscectomy. PMH: COPD, SOB, Asthma, Gerd, arthritis, GIB, full dentures, HOH, R knee lateral meniscus, L medial meniscus, CVA, HA, DM, HTN, carpal tunnel release, eye surg, nasal septum surg    Clinical Impression  Patient presents with functional limitations due to deficits listed in PT problem list (see below). Pt with generalized weakness, pain and balance deficits impacting safe mobility. Pt high fall risk. Education provided on back precautions. Eager to return to work in 2 weeks however reports being bed-bound for ~3 weeks prior to surgery. Pt would benefit from skilled PT to improve transfers, gait and balance so pt can maximize independence and minimize fall risk prior to return home. If pt does not qualify for SNF, recommend 24/7 and HHPT. If disposition is home, pt needs to be able to negotiate 4 steps next session.    Follow Up Recommendations SNF;Supervision/Assistance - 24 hour    Equipment Recommendations  None recommended by PT    Recommendations for Other Services       Precautions / Restrictions Precautions Precautions: Back Precaution Booklet Issued: Yes (comment) Precaution Comments: Able to verbalize 3/3 back precautions. Restrictions Weight Bearing Restrictions: No      Mobility  Bed Mobility               General bed mobility comments: Pt received sitting in chair upon PT arrival.   Transfers Overall transfer level: Needs assistance Equipment used: Rolling walker (2 wheeled) Transfers: Sit to/from Stand Sit to Stand: Min assist         General transfer comment: Min A for balance during transition to standing from chair. Increased effort.  Ambulation/Gait Ambulation/Gait assistance: Min assist Ambulation Distance (Feet): 50 Feet Assistive device: Rolling walker (2  wheeled) Gait Pattern/deviations: Step-to pattern;Shuffle;Trunk flexed;Decreased step length - left;Decreased step length - right   Gait velocity interpretation: Below normal speed for age/gender General Gait Details: Pt with slow, shuffling like gait pattern with increased trunk lean and forward momentum. 1 standing rest break due to fatigue. Dyspnea present. VC's for RW proximity and safety.  Stairs            Wheelchair Mobility    Modified Rankin (Stroke Patients Only)       Balance Overall balance assessment: Needs assistance Sitting-balance support: Feet supported;No upper extremity supported Sitting balance-Leahy Scale: Fair     Standing balance support: During functional activity Standing balance-Leahy Scale: Poor Standing balance comment: Requires BUE support on RW for balance/safety.                             Pertinent Vitals/Pain Pain Assessment: 0-10 Pain Score: 2  Pain Location: back Pain Descriptors / Indicators: Sore Pain Intervention(s): Repositioned;Monitored during session;Limited activity within patient's tolerance    Home Living Family/patient expects to be discharged to:: Unsure Living Arrangements: Spouse/significant other Available Help at Discharge: Family;Available 24 hours/day Type of Home: House Home Access: Stairs to enter Entrance Stairs-Rails: Right;Left;Can reach both Entrance Stairs-Number of Steps: 4 Home Layout: One level Home Equipment: Walker - 2 wheels;Cane - single point;Bedside commode;Shower seat;Grab bars - toilet;Grab bars - tub/shower;Hand held shower head      Prior Function Level of Independence: Independent with assistive device(s)         Comments: Pt using RW for mobility. 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 Reports 1 "bad fall" a few months ago.     Hand Dominance   Dominant Hand: Right    Extremity/Trunk Assessment   Upper Extremity Assessment: Defer to OT evaluation;Generalized  weakness           Lower Extremity Assessment: Generalized weakness      Cervical / Trunk Assessment: Normal  Communication   Communication: No difficulties  Cognition Arousal/Alertness: Awake/alert Behavior During Therapy: WFL for tasks assessed/performed Overall Cognitive Status: Within Functional Limits for tasks assessed                      General Comments General comments (skin integrity, edema, etc.): Discussed post surgical risks and detrimental affects of bedrest and importance of mobility. Discussed disposition options - HHPT vs SNF to allow for better/ faster return to work in 2 weeks.    Exercises        Assessment/Plan    PT Assessment Patient needs continued PT services  PT Diagnosis Abnormality of gait;Generalized weakness;Acute pain   PT Problem List Pain;Decreased strength;Cardiopulmonary status limiting activity;Decreased activity tolerance;Decreased balance;Decreased mobility;Decreased safety awareness  PT Treatment Interventions Gait training;Balance training;Neuromuscular re-education;Functional mobility training;Therapeutic activities;Therapeutic exercise;Patient/family education;Stair training   PT Goals (Current goals can be found in the Care Plan section) Acute Rehab PT Goals Patient Stated Goal: to go back to work PT Goal Formulation: With patient Time For Goal Achievement: 11/20/14 Potential to Achieve Goals: Fair    Frequency Min 5X/week   Barriers to discharge Inaccessible home environment Needs to be able to negotiate 4 steps to enter home    Co-evaluation               End of Session Equipment Utilized During Treatment: Gait belt Activity Tolerance: Patient limited by fatigue Patient left: in chair;with call bell/phone within reach;with chair alarm set;with family/visitor present      Functional Assessment Tool Used: Clinical judgment Functional Limitation: Mobility: Walking and moving around Mobility: Walking and  Moving Around Current Status (R1165): At least 20 percent but less than 40 percent impaired, limited or restricted Mobility: Walking and Moving Around Goal Status [phone]): At least 1 percent but less than 20 percent impaired, limited or restricted    Time: 1210-1237 PT Time Calculation (min) (ACUTE ONLY): 27 min   Charges:   PT Evaluation $Initial PT Evaluation Tier I: 1 Procedure PT Treatments $Gait Training: 8-22 mins   PT G Codes:   Functional Assessment Tool Used: Clinical judgment Functional Limitation: Mobility: Walking and moving around    [place], [place] A 11/06/2014, 1:16 PM  Candy Sledge, PT, DPT [phone]

## 2014-11-07 ENCOUNTER — Encounter: Payer: Self-pay | Admitting: Internal Medicine

## 2014-11-07 DIAGNOSIS — J449 Chronic obstructive pulmonary disease, unspecified: Secondary | ICD-10-CM | POA: Diagnosis present

## 2014-11-07 DIAGNOSIS — Z87891 Personal history of nicotine dependence: Secondary | ICD-10-CM | POA: Diagnosis not present

## 2014-11-07 DIAGNOSIS — Z8673 Personal history of transient ischemic attack (TIA), and cerebral infarction without residual deficits: Secondary | ICD-10-CM | POA: Diagnosis not present

## 2014-11-07 DIAGNOSIS — H9190 Unspecified hearing loss, unspecified ear: Secondary | ICD-10-CM | POA: Diagnosis present

## 2014-11-07 DIAGNOSIS — M545 Low back pain: Secondary | ICD-10-CM | POA: Diagnosis present

## 2014-11-07 DIAGNOSIS — I10 Essential (primary) hypertension: Secondary | ICD-10-CM | POA: Diagnosis present

## 2014-11-07 DIAGNOSIS — M1990 Unspecified osteoarthritis, unspecified site: Secondary | ICD-10-CM | POA: Diagnosis present

## 2014-11-07 DIAGNOSIS — J45909 Unspecified asthma, uncomplicated: Secondary | ICD-10-CM | POA: Diagnosis present

## 2014-11-07 DIAGNOSIS — E119 Type 2 diabetes mellitus without complications: Secondary | ICD-10-CM | POA: Diagnosis present

## 2014-11-07 DIAGNOSIS — K219 Gastro-esophageal reflux disease without esophagitis: Secondary | ICD-10-CM | POA: Diagnosis present

## 2014-11-07 DIAGNOSIS — G4730 Sleep apnea, unspecified: Secondary | ICD-10-CM | POA: Diagnosis present

## 2014-11-07 DIAGNOSIS — M5126 Other intervertebral disc displacement, lumbar region: Secondary | ICD-10-CM | POA: Diagnosis present

## 2014-11-07 DIAGNOSIS — Z794 Long term (current) use of insulin: Secondary | ICD-10-CM | POA: Diagnosis not present

## 2014-11-07 LAB — GLUCOSE, CAPILLARY
GLUCOSE-CAPILLARY: 109 mg/dL — AB (ref 70–99)
GLUCOSE-CAPILLARY: 115 mg/dL — AB (ref 70–99)
GLUCOSE-CAPILLARY: 133 mg/dL — AB (ref 70–99)
Glucose-Capillary: 125 mg/dL — ABNORMAL HIGH (ref 70–99)

## 2014-11-07 MED ORDER — MAGNESIUM CITRATE PO SOLN
0.5000 | Freq: Once | ORAL | Status: DC
  Filled 2014-11-07: qty 296

## 2014-11-07 MED ORDER — TIOTROPIUM BROMIDE MONOHYDRATE 18 MCG IN CAPS
18.0000 ug | ORAL_CAPSULE | Freq: Every day | RESPIRATORY_TRACT | Status: DC
  Administered 2014-11-07: 18 ug via RESPIRATORY_TRACT

## 2014-11-07 MED ORDER — BUDESONIDE-FORMOTEROL FUMARATE 160-4.5 MCG/ACT IN AERO
2.0000 | INHALATION_SPRAY | Freq: Two times a day (BID) | RESPIRATORY_TRACT | Status: DC
  Administered 2014-11-07: 2 via RESPIRATORY_TRACT

## 2014-11-07 NOTE — Discharge Instructions (Signed)
Spinal Fusion °Care After °Refer to this sheet in the next few weeks. These instructions provide you with information on caring for yourself after your procedure. Your caregiver may also give you more specific instructions. Your treatment has been planned according to current medical practices, but problems sometimes occur. Call your caregiver if you have any problems or questions after your procedure. °HOME CARE INSTRUCTIONS  °· Take whatever pain medicine has been prescribed by your caregiver. Do not take over-the-counter pain medicine unless directed otherwise by your caregiver. °· Do not drive if you are taking narcotic pain medicines. °· Change your bandage (dressing) if necessary or as directed by your caregiver. °· Do not get your surgical cut (incision) wet. After a few days you may take quick showers (rather than baths), but keep your incision clean and dry. Covering the incision with plastic wrap while you shower should keep your incision dry. A few weeks after surgery, once your incision has healed and your caregiver says it is okay, you can take baths or go swimming. °· If you have been prescribed medicine to prevent your blood from clotting, follow the directions carefully. °· Check the area around your incision often. Look for redness and swelling. Also, look for anything leaking from your wound. You can use a mirror or have a family member inspect your incision if it is in a place where it is difficult for you to see. °· Ask your caregiver what activities you should avoid and for how long. °· Walk as much as possible. °· Do not lift anything heavier than 10 pounds (4.5 kilograms) until your caregiver says it is safe. °· Do not twist or bend for a few weeks. Try not to pull on things. Avoid sitting for long periods of time. Change positions at least every hour. °· Ask your caregiver what kinds of exercise you should do to make your back stronger and when you should begin doing these exercises. °SEEK  IMMEDIATE MEDICAL CARE IF:  °· Pain suddenly becomes much worse. °· The incision area is red, swollen, bleeding, or leaking fluid. °· Your legs or feet become increasingly painful, numb, weak, or swollen. °· You have trouble controlling urination or bowel movements. °· You have trouble breathing. °· You have chest pain. °· You have a fever. °MAKE SURE YOU: °· Understand these instructions. °· Will watch your condition. °· Will get help right away if you are not doing well or get worse. °Document Released: 05/29/2005 Document Revised: 02/01/2012 Document Reviewed: 01/22/2011 °Exit[employer]® Patient Information ©2015 [employer]. This information is not intended to replace advice given to you by your health care provider. Make sure you discuss any questions you have with your health care provider. ° °

## 2014-11-07 NOTE — Progress Notes (Signed)
Patient discharged to nursing facility. IV removed, discharge instructions discussed and reviewed with patient. Report called to nursing facility receiving patient.

## 2014-11-07 NOTE — Progress Notes (Signed)
Physical Therapy Treatment Patient Details Name: Eddie Carter MRN: [id number] DOB: 01-Oct-1941 Today's Date: 11/07/2014    History of Present Illness 60 male s/p L3-4 microdiscectomy. PMH: COPD, SOB, Asthma, Gerd, arthritis, GIB, full dentures, HOH, R knee lateral meniscus, L medial meniscus, CVA, HA, DM, HTN, carpal tunnel release, eye surg, nasal septum surg    PT Comments    Patient is making good steady progress. Having some increased LE pain this session. Patient is hopeful that he will be able to DC to [facility] in [place] for ongoing therapy prior to returning home. His wife is unable to assist him much at home. Will continue with current POC  Follow Up Recommendations  SNF;Supervision/Assistance - 24 hour     Equipment Recommendations  None recommended by PT    Recommendations for Other Services       Precautions / Restrictions Precautions Precautions: Back Precaution Comments: Able to verbalize 3/3 back precautions. Restrictions Weight Bearing Restrictions: No    Mobility  Bed Mobility   Bed Mobility: Sit to Sidelying Rolling: Min guard       Sit to sidelying: Min assist General bed mobility comments: Min A for LEs. CUes for positioning and log roll technique  Transfers Overall transfer level: Needs assistance Equipment used: Rolling walker (2 wheeled)   Sit to Stand: Min assist         General transfer comment: Min A for balance during transition to standing from chair. Continues with increased effort. Cues to no bend forward with stand  Ambulation/Gait Ambulation/Gait assistance: Min assist Ambulation Distance (Feet): 120 Feet Assistive device: Rolling walker (2 wheeled) Gait Pattern/deviations: Step-through pattern;Decreased stride length;Shuffle;Trunk flexed   Gait velocity interpretation: Below normal speed for age/gender General Gait Details: Cues for upright posture and safe positioning of RW. Patient with increase pain in LEs as he  ambulating making stride length decrease and patient ambulate with mroe shuffled gait   Stairs Stairs:  (not attempted due to pain in LEs)          Wheelchair Mobility    Modified Rankin (Stroke Patients Only)       Balance                                    Cognition Arousal/Alertness: Awake/alert Behavior During Therapy: WFL for tasks assessed/performed Overall Cognitive Status: Within Functional Limits for tasks assessed                      Exercises      General Comments        Pertinent Vitals/Pain Pain Score: 4  Pain Location: legs Pain Descriptors / Indicators: Burning Pain Intervention(s): Monitored during session;Repositioned    Home Living                      Prior Function            PT Goals (current goals can now be found in the care plan section) Progress towards PT goals: Progressing toward goals    Frequency  Min 5X/week    PT Plan Current plan remains appropriate    Co-evaluation             End of Session   Activity Tolerance: Patient limited by pain Patient left: in chair;with call bell/phone within reach     Time: 1005-1029 PT Time Calculation (min) (ACUTE ONLY): 24 min  Charges:  $Gait Training: 8-22 mins $Therapeutic Activity: 8-22 mins                    G Codes:      Jacqualyn Posey 11/07/2014, 10:33 AM  11/07/2014 Jacqualyn Posey PTA [phone] pager [phone] office

## 2014-11-07 NOTE — Progress Notes (Signed)
UR COMPLETED  

## 2014-11-07 NOTE — Discharge Summary (Signed)
Physician Discharge Summary  Patient ID: Eddie Carter MRN: [id number] DOB/AGE: 72/30/1942 72 y.o.  Admit date: 11/05/2014 Discharge date: 11/07/2014  Admission Diagnoses:  L3-4 HNP  Discharge Diagnoses:  Principal Problem:   HNP (herniated nucleus pulposus), lumbar Active Problems:   Herniated nucleus pulposus, lumbar Left L3-4 microdiscectomy  Discharged Condition: good  Hospital Course: patient was taken to OR for surgery.  Tolerated procedure well and without complication. Stable postop.  PT recommended snf placement for rehab.    Consults: None   Discharge Exam: Blood pressure 110/54, pulse 68, temperature 98.1 F (36.7 C), temperature source Oral, resp. rate 18, height 6' (1.829 m), weight 140.161 kg (309 lb), SpO2 96 %.  Exam: wound looked good.  No drainage or signs of infection.  Neurologically intact.  Skin warm and dry.  No increased respiratory effort.   Disposition: 01-Home or Self Care  Discharge Instructions    Call MD / Call 911    Complete by:  As directed   If you experience chest pain or shortness of breath, CALL 911 and be transported to the hospital emergency room.  If you develope a fever above 101 F, pus (white drainage) or increased drainage or redness at the wound, or calf pain, call your surgeon's office.     Constipation Prevention    Complete by:  As directed   Drink plenty of fluids.  Prune juice may be helpful.  You may use a stool softener, such as Colace (over the counter) 100 mg twice a day.  Use MiraLax (over the counter) for constipation as needed.     Diet Carb Modified    Complete by:  As directed      Discharge instructions    Complete by:  As directed   No lifting greater than 10 lbs. Avoid bending, stooping and twisting. Walk in house for first week them may start to get out slowly increasing distance up to one mile by 3 weeks post op. Keep incision dry for 3 days, may use tegaderm or similar water impervious dressing.     Driving  restrictions    Complete by:  As directed   No driving for 2 weeks     Increase activity slowly as tolerated    Complete by:  As directed      Lifting restrictions    Complete by:  As directed   No lifting for 6 weeks            Medication List    TAKE these medications        budesonide-formoterol 160-4.5 MCG/ACT inhaler  Commonly known as:  SYMBICORT  Inhale 2 puffs into the lungs 2 (two) times daily.     CINNAMON PO  Take 1 tablet by mouth daily.     clopidogrel 75 MG tablet  Commonly known as:  PLAVIX  Take 75 mg by mouth daily.     docusate sodium 100 MG capsule  Commonly known as:  COLACE  Take 100 mg by mouth daily.     dutasteride 0.5 MG capsule  Commonly known as:  AVODART  Take 0.5 mg by mouth daily.     fish oil-omega-3 fatty acids 1000 MG capsule  Take 1 g by mouth daily.     fluticasone 50 MCG/ACT nasal spray  Commonly known as:  FLONASE  Place 2 sprays into both nostrils daily.     furosemide 40 MG tablet  Commonly known as:  LASIX  Take 40 mg by mouth daily.  gabapentin 100 MG capsule  Commonly known as:  NEURONTIN  Take 100 mg by mouth 3 (three) times daily as needed (pain).     hydroxypropyl methylcellulose / hypromellose 2.5 % ophthalmic solution  Commonly known as:  ISOPTO TEARS / GONIOVISC  Place 1 drop into both eyes 3 (three) times daily as needed for dry eyes.     hydrOXYzine 10 MG tablet  Commonly known as:  ATARAX/VISTARIL  Take 2 tablets by mouth at bedtime.     insulin detemir 100 UNIT/ML injection  Commonly known as:  LEVEMIR  Inject 16 Units into the skin every morning.     losartan 25 MG tablet  Commonly known as:  COZAAR  Take 25 mg by mouth daily.     metFORMIN 500 MG tablet  Commonly known as:  GLUCOPHAGE  Take 500 mg by mouth daily.     methocarbamol 750 MG tablet  Commonly known as:  ROBAXIN  Take 750-1,500 mg by mouth every 6 (six) hours as needed for muscle spasms.     methocarbamol 500 MG tablet   Commonly known as:  ROBAXIN  Take 1 tablet (500 mg total) by mouth every 6 (six) hours as needed for muscle spasms.     methylPREDNISolone 8 MG tablet  Commonly known as:  MEDROL  Take 8 mg by mouth daily.     oxyCODONE-acetaminophen 7.5-325 MG per tablet  Commonly known as:  PERCOCET  Take 1 tablet by mouth every 6 (six) hours as needed for pain.     oxyCODONE-acetaminophen 7.5-325 MG per tablet  Commonly known as:  PERCOCET  Take 2 tablets by mouth every 6 (six) hours as needed for pain (every 4-6 hours, maximum of 10 per day.).     oxymetazoline 0.05 % nasal spray  Commonly known as:  AFRIN  Place 1 spray into both nostrils 2 (two) times daily as needed for congestion.     solifenacin 10 MG tablet  Commonly known as:  VESICARE  Take 10 mg by mouth daily.     tamsulosin 0.4 MG Caps capsule  Commonly known as:  FLOMAX  Take 1 capsule by mouth daily.     tiotropium 18 MCG inhalation capsule  Commonly known as:  SPIRIVA  Place 1 capsule (18 mcg total) into inhaler and inhale daily.     Vitamin D 1000 UNITS capsule  Take 3,000 Units by mouth daily.           Follow-up Information    Follow up with NITKA,Morry Veiga E, MD In 2 weeks.   Specialty:  Orthopedic Surgery   Why:  need return office visit 2 weeks postop   Contact information:   [address] [place] [id number] [phone]       Signed: Lanae Crumbly 11/07/2014, 11:47 AM

## 2014-11-07 NOTE — Progress Notes (Signed)
Subjective: Doing better today.  Pain controlled.  No bm yet. Denies cp, sob   Objective: Vital signs in last 24 hours: Temp:  [97.1 F (36.2 C)-98.5 F (36.9 C)] 98.5 F (36.9 C) (12/16 0532) Pulse Rate:  [60-81] 60 (12/16 0532) Resp:  [18-20] 18 (12/16 0532) BP: (105-126)/(46-63) 105/53 mmHg (12/16 0532) SpO2:  [91 %-96 %] 96 % (12/16 0532)  Intake/Output from previous day: 12/15 0701 - 12/16 0700 In: 243 [P.O.:240; I.V.:3] Out: -  Intake/Output this shift:    No results for input(s): HGB in the last 72 hours. No results for input(s): WBC, RBC, HCT, PLT in the last 72 hours. No results for input(s): NA, K, CL, CO2, BUN, CREATININE, GLUCOSE, CALCIUM in the last 72 hours. No results for input(s): LABPT, INR in the last 72 hours.  Exam:  Wound looks good.  No drainage or signs of infection.  Neurologically intact. bilat calves nontender.   Assessment/Plan: PT recommending snf placement for rehab and patient agrees to this.  Social work consult ordered.  Prefers [facility] in [place].  Transfer when bed available     OWENS,JAMES M 11/07/2014, 8:32 AM

## 2014-11-10 NOTE — Progress Notes (Signed)
This RN received call from Marquette Old, patient's son, indicating that his father CPAP mask never arrived to its destination.  CPAP mask was brought in by patient and belong to him.  After speaking with the [facility] for the hospital today, he allowed for [facility] to contact [place] and order a new CPAP mask under [facility] Account.  This information was given to [place] at [facility] and to son Kysean Sweet.

## 2014-11-23 ENCOUNTER — Encounter: Payer: Self-pay | Admitting: Internal Medicine

## 2014-11-26 ENCOUNTER — Other Ambulatory Visit: Payer: Self-pay | Admitting: Specialist

## 2014-11-28 ENCOUNTER — Other Ambulatory Visit: Payer: Self-pay | Admitting: Specialist

## 2014-11-28 DIAGNOSIS — R911 Solitary pulmonary nodule: Secondary | ICD-10-CM

## 2014-12-04 ENCOUNTER — Ambulatory Visit
Admission: RE | Admit: 2014-12-04 | Discharge: 2014-12-04 | Disposition: A | Discharge status: Home / Self Care | Payer: No Typology Code available for payment source | Source: Ambulatory Visit | Attending: Specialist | Admitting: Specialist

## 2014-12-04 DIAGNOSIS — R911 Solitary pulmonary nodule: Secondary | ICD-10-CM

## 2014-12-04 MED ORDER — IOHEXOL 300 MG/ML  SOLN
75.0000 mL | Freq: Once | INTRAMUSCULAR | Status: AC | PRN
  Administered 2014-12-04: 75 mL via INTRAVENOUS

## 2014-12-25 ENCOUNTER — Other Ambulatory Visit: Payer: Self-pay | Admitting: Specialist

## 2014-12-25 DIAGNOSIS — M545 Low back pain: Secondary | ICD-10-CM

## 2014-12-31 ENCOUNTER — Other Ambulatory Visit (HOSPITAL_COMMUNITY): Payer: Self-pay | Admitting: Specialist

## 2014-12-31 ENCOUNTER — Other Ambulatory Visit: Payer: Self-pay | Admitting: Specialist

## 2014-12-31 DIAGNOSIS — M545 Low back pain: Secondary | ICD-10-CM

## 2015-01-14 ENCOUNTER — Ambulatory Visit
Admission: RE | Admit: 2015-01-14 | Discharge: 2015-01-14 | Disposition: A | Discharge status: Home / Self Care | Payer: No Typology Code available for payment source | Source: Ambulatory Visit | Attending: Specialist | Admitting: Specialist

## 2015-01-14 DIAGNOSIS — M545 Low back pain: Secondary | ICD-10-CM | POA: Diagnosis not present

## 2015-01-14 DIAGNOSIS — S3992XA Unspecified injury of lower back, initial encounter: Secondary | ICD-10-CM | POA: Diagnosis not present

## 2015-01-14 MED ORDER — GADOBENATE DIMEGLUMINE 529 MG/ML IV SOLN
20.0000 mL | Freq: Once | INTRAVENOUS | Status: AC | PRN
  Administered 2015-01-14: 20 mL via INTRAVENOUS

## 2015-01-28 DIAGNOSIS — N401 Enlarged prostate with lower urinary tract symptoms: Secondary | ICD-10-CM | POA: Diagnosis not present

## 2015-01-28 DIAGNOSIS — E669 Obesity, unspecified: Secondary | ICD-10-CM | POA: Diagnosis not present

## 2015-01-28 DIAGNOSIS — E291 Testicular hypofunction: Secondary | ICD-10-CM | POA: Diagnosis not present

## 2015-02-04 ENCOUNTER — Other Ambulatory Visit (HOSPITAL_COMMUNITY): Payer: Self-pay | Admitting: Specialist

## 2015-02-06 ENCOUNTER — Other Ambulatory Visit (HOSPITAL_COMMUNITY): Payer: Self-pay | Admitting: *Deleted

## 2015-02-07 ENCOUNTER — Encounter (HOSPITAL_COMMUNITY): Payer: Self-pay

## 2015-02-07 ENCOUNTER — Encounter (HOSPITAL_COMMUNITY)
Admission: RE | Admit: 2015-02-07 | Discharge: 2015-02-07 | Disposition: A | Discharge status: Home / Self Care | Payer: No Typology Code available for payment source | Source: Ambulatory Visit | Attending: Specialist | Admitting: Specialist

## 2015-02-07 DIAGNOSIS — Z01812 Encounter for preprocedural laboratory examination: Secondary | ICD-10-CM | POA: Diagnosis present

## 2015-02-07 HISTORY — DX: Pneumonia, unspecified organism: J18.9

## 2015-02-07 LAB — COMPREHENSIVE METABOLIC PANEL
ALT: 30 U/L (ref 0–53)
AST: 35 U/L (ref 0–37)
Albumin: 3.6 g/dL (ref 3.5–5.2)
Alkaline Phosphatase: 55 U/L (ref 39–117)
Anion gap: 12 (ref 5–15)
BUN: 9 mg/dL (ref 6–23)
CALCIUM: 9.2 mg/dL (ref 8.4–10.5)
CO2: 21 mmol/L (ref 19–32)
Chloride: 104 mmol/L (ref 96–112)
Creatinine, Ser: 1.06 mg/dL (ref 0.50–1.35)
GFR calc Af Amer: 79 mL/min — ABNORMAL LOW (ref >90)
GFR, EST NON AFRICAN AMERICAN: 68 mL/min — AB (ref >90)
Glucose, Bld: 127 mg/dL — ABNORMAL HIGH (ref 70–99)
Potassium: 4.1 mmol/L (ref 3.5–5.1)
SODIUM: 137 mmol/L (ref 135–145)
TOTAL PROTEIN: 6.6 g/dL (ref 6.0–8.3)
Total Bilirubin: 0.8 mg/dL (ref 0.3–1.2)

## 2015-02-07 LAB — NO BLOOD PRODUCTS

## 2015-02-07 LAB — CBC
HCT: 43.1 % (ref 39.0–52.0)
HEMOGLOBIN: 14.4 g/dL (ref 13.0–17.0)
MCH: 30.6 pg (ref 26.0–34.0)
MCHC: 33.4 g/dL (ref 30.0–36.0)
MCV: 91.7 fL (ref 78.0–100.0)
Platelets: 180 10*3/uL (ref 150–400)
RBC: 4.7 MIL/uL (ref 4.22–5.81)
RDW: 14.2 % (ref 11.5–15.5)
WBC: 9 10*3/uL (ref 4.0–10.5)

## 2015-02-07 LAB — SURGICAL PCR SCREEN
MRSA, PCR: NEGATIVE
Staphylococcus aureus: NEGATIVE

## 2015-02-07 NOTE — Pre-Procedure Instructions (Signed)
Eddie Carter  02/07/2015   Your procedure is scheduled on:  02/12/2015  Report to [facility] Admitting at 11:20 AM.  Call this number if you have problems the morning of surgery: [phone]   Remember:   Do not eat food or drink liquids after midnight.  On Monday   Take these medicines the morning of surgery with A SIP OF WATER: Tylenol is ok to take day of surgery if you need it    Do not wear jewelry   Do not wear lotions, powders, or perfumes. You may wear deodorant.   Men may shave face and neck.   Do not bring valuables to the hospital.  [facility] is not responsible                  for any belongings or valuables.               Contacts, dentures or bridgework may not be worn into surgery.   Leave suitcase in the car. After surgery it may be brought to your room.   For patients admitted to the hospital, discharge time is determined by your                treatment team.               Patients discharged the day of surgery will not be allowed to drive  home.  Name and phone number of your driver:  With family  Special Instructions: Special Instructions: [facility] - Preparing for Surgery  Before surgery, you can play an important role.  Because skin is not sterile, your skin needs to be as free of germs as possible.  You can reduce the number of germs on you skin by washing with CHG (chlorahexidine gluconate) soap before surgery.  CHG is an antiseptic cleaner which kills germs and bonds with the skin to continue killing germs even after washing.  Please DO NOT use if you have an allergy to CHG or antibacterial soaps.  If your skin becomes reddened/irritated stop using the CHG and inform your nurse when you arrive at Short Stay.  Do not shave (including legs and underarms) for at least 48 hours prior to the first CHG shower.  You may shave your face.  Please follow these instructions carefully:   1.  Shower with CHG Soap the night before surgery and the   morning of Surgery.  2.  If you choose to wash your hair, wash your hair first as usual with your  normal shampoo.  3.  After you shampoo, rinse your hair and body thoroughly to remove the  Shampoo.  4.  Use CHG as you would any other liquid soap.  You can apply chg directly to the skin and wash gently with scrungie or a clean washcloth.  5.  Apply the CHG Soap to your body ONLY FROM THE NECK DOWN.    Do not use on open wounds or open sores.  Avoid contact with your eyes, ears, mouth and genitals (private parts).  Wash genitals (private parts)   with your normal soap.  6.  Wash thoroughly, paying special attention to the area where your surgery will be performed.  7.  Thoroughly rinse your body with warm water from the neck down.  8.  DO NOT shower/wash with your normal soap after using and rinsing off   the CHG Soap.  9.  Pat yourself dry with a clean towel.  10.  Wear clean pajamas.            11.  Place clean sheets on your bed the night of your first shower and do not sleep with pets.  Day of Surgery  Do not apply any lotions/deodorants the morning of surgery.  Please wear clean clothes to the hospital/surgery center.    Please read over the following fact sheets that you were given: Pain Booklet, Coughing and Deep Breathing, MRSA Information and Surgical Site Infection Prevention

## 2015-02-11 MED ORDER — CHLORHEXIDINE GLUCONATE 4 % EX LIQD
60.0000 mL | Freq: Once | CUTANEOUS | Status: DC
  Filled 2015-02-11: qty 60

## 2015-02-11 MED ORDER — DEXTROSE 5 % IV SOLN
3.0000 g | INTRAVENOUS | Status: AC
  Administered 2015-02-12: 3 g via INTRAVENOUS
  Filled 2015-02-11: qty 3000

## 2015-02-12 ENCOUNTER — Other Ambulatory Visit: Payer: Self-pay

## 2015-02-12 ENCOUNTER — Ambulatory Visit (HOSPITAL_COMMUNITY): Payer: No Typology Code available for payment source | Admitting: Vascular Surgery

## 2015-02-12 ENCOUNTER — Encounter (HOSPITAL_COMMUNITY): Payer: Self-pay | Admitting: *Deleted

## 2015-02-12 ENCOUNTER — Observation Stay (HOSPITAL_COMMUNITY)
Admission: RE | Admit: 2015-02-12 | Discharge: 2015-02-14 | Disposition: A | Discharge status: Home / Self Care | Payer: No Typology Code available for payment source | Source: Ambulatory Visit | Attending: Specialist | Admitting: Specialist

## 2015-02-12 ENCOUNTER — Ambulatory Visit (HOSPITAL_COMMUNITY): Payer: No Typology Code available for payment source

## 2015-02-12 ENCOUNTER — Encounter (HOSPITAL_COMMUNITY)
Admission: RE | Disposition: A | Discharge status: Home / Self Care | Payer: Self-pay | Source: Ambulatory Visit | Attending: Specialist

## 2015-02-12 ENCOUNTER — Ambulatory Visit (HOSPITAL_COMMUNITY): Payer: No Typology Code available for payment source | Admitting: Certified Registered Nurse Anesthetist

## 2015-02-12 DIAGNOSIS — E119 Type 2 diabetes mellitus without complications: Secondary | ICD-10-CM | POA: Diagnosis not present

## 2015-02-12 DIAGNOSIS — G4730 Sleep apnea, unspecified: Secondary | ICD-10-CM | POA: Insufficient documentation

## 2015-02-12 DIAGNOSIS — Z886 Allergy status to analgesic agent status: Secondary | ICD-10-CM | POA: Insufficient documentation

## 2015-02-12 DIAGNOSIS — M1990 Unspecified osteoarthritis, unspecified site: Secondary | ICD-10-CM | POA: Insufficient documentation

## 2015-02-12 DIAGNOSIS — Z8673 Personal history of transient ischemic attack (TIA), and cerebral infarction without residual deficits: Secondary | ICD-10-CM | POA: Diagnosis not present

## 2015-02-12 DIAGNOSIS — Z87891 Personal history of nicotine dependence: Secondary | ICD-10-CM | POA: Diagnosis not present

## 2015-02-12 DIAGNOSIS — M5116 Intervertebral disc disorders with radiculopathy, lumbar region: Principal | ICD-10-CM | POA: Insufficient documentation

## 2015-02-12 DIAGNOSIS — M5126 Other intervertebral disc displacement, lumbar region: Secondary | ICD-10-CM | POA: Diagnosis present

## 2015-02-12 DIAGNOSIS — J45909 Unspecified asthma, uncomplicated: Secondary | ICD-10-CM | POA: Diagnosis not present

## 2015-02-12 DIAGNOSIS — I10 Essential (primary) hypertension: Secondary | ICD-10-CM | POA: Insufficient documentation

## 2015-02-12 DIAGNOSIS — J449 Chronic obstructive pulmonary disease, unspecified: Secondary | ICD-10-CM | POA: Insufficient documentation

## 2015-02-12 DIAGNOSIS — K219 Gastro-esophageal reflux disease without esophagitis: Secondary | ICD-10-CM | POA: Diagnosis not present

## 2015-02-12 DIAGNOSIS — Z419 Encounter for procedure for purposes other than remedying health state, unspecified: Secondary | ICD-10-CM

## 2015-02-12 DIAGNOSIS — Z79899 Other long term (current) drug therapy: Secondary | ICD-10-CM | POA: Diagnosis not present

## 2015-02-12 HISTORY — PX: LUMBAR LAMINECTOMY/DECOMPRESSION MICRODISCECTOMY: SHX5026

## 2015-02-12 SURGERY — LUMBAR LAMINECTOMY/DECOMPRESSION MICRODISCECTOMY
Anesthesia: General | Site: Spine Lumbar

## 2015-02-12 MED ORDER — THROMBIN 20000 UNITS EX SOLR
CUTANEOUS | Status: AC
  Filled 2015-02-12: qty 20000

## 2015-02-12 MED ORDER — FENTANYL CITRATE 0.05 MG/ML IJ SOLN
INTRAMUSCULAR | Status: AC
Start: 2015-02-12 — End: 2015-02-12
  Filled 2015-02-12: qty 5

## 2015-02-12 MED ORDER — SODIUM CHLORIDE 0.9 % IJ SOLN: 3.0000 mL | INTRAMUSCULAR | Status: DC | PRN

## 2015-02-12 MED ORDER — SODIUM CHLORIDE 0.9 % IJ SOLN
3.0000 mL | Freq: Two times a day (BID) | INTRAMUSCULAR | Status: DC
  Administered 2015-02-13: 3 mL via INTRAVENOUS

## 2015-02-12 MED ORDER — ACETAMINOPHEN 650 MG RE SUPP: 650.0000 mg | RECTAL | Status: DC | PRN

## 2015-02-12 MED ORDER — OXYCODONE-ACETAMINOPHEN 5-325 MG PO TABS
1.0000 | ORAL_TABLET | ORAL | Status: DC | PRN
  Administered 2015-02-13 – 2015-02-14 (×6): 2 via ORAL
  Filled 2015-02-12 (×6): qty 2

## 2015-02-12 MED ORDER — ONDANSETRON HCL 4 MG/2ML IJ SOLN: 4.0000 mg | Freq: Once | INTRAMUSCULAR | Status: DC | PRN

## 2015-02-12 MED ORDER — SUCCINYLCHOLINE CHLORIDE 20 MG/ML IJ SOLN
INTRAMUSCULAR | Status: DC | PRN
  Administered 2015-02-12: 200 mg via INTRAVENOUS

## 2015-02-12 MED ORDER — ONDANSETRON HCL 4 MG/2ML IJ SOLN
INTRAMUSCULAR | Status: DC | PRN
  Administered 2015-02-12: 4 mg via INTRAVENOUS

## 2015-02-12 MED ORDER — ALBUTEROL SULFATE (2.5 MG/3ML) 0.083% IN NEBU
2.5000 mg | INHALATION_SOLUTION | Freq: Two times a day (BID) | RESPIRATORY_TRACT | Status: DC
  Filled 2015-02-12 (×2): qty 3

## 2015-02-12 MED ORDER — PHENOL 1.4 % MT LIQD: 1.0000 | OROMUCOSAL | Status: DC | PRN

## 2015-02-12 MED ORDER — LIDOCAINE HCL (CARDIAC) 20 MG/ML IV SOLN
INTRAVENOUS | Status: DC | PRN
  Administered 2015-02-12: 100 mg via INTRAVENOUS

## 2015-02-12 MED ORDER — FLUTICASONE PROPIONATE 50 MCG/ACT NA SUSP
2.0000 | Freq: Every day | NASAL | Status: DC
  Administered 2015-02-12: 2 via NASAL
  Filled 2015-02-12: qty 16

## 2015-02-12 MED ORDER — PANTOPRAZOLE SODIUM 40 MG IV SOLR
40.0000 mg | Freq: Every day | INTRAVENOUS | Status: DC
  Administered 2015-02-12: 40 mg via INTRAVENOUS
  Filled 2015-02-12: qty 40

## 2015-02-12 MED ORDER — ALBUTEROL SULFATE (2.5 MG/3ML) 0.083% IN NEBU
2.5000 mg | INHALATION_SOLUTION | RESPIRATORY_TRACT | Status: DC
  Administered 2015-02-12: 2.5 mg via RESPIRATORY_TRACT
  Filled 2015-02-12: qty 3

## 2015-02-12 MED ORDER — ACETAMINOPHEN 325 MG PO TABS
650.0000 mg | ORAL_TABLET | ORAL | Status: DC | PRN
  Administered 2015-02-12 – 2015-02-13 (×2): 650 mg via ORAL
  Filled 2015-02-12 (×2): qty 2

## 2015-02-12 MED ORDER — OXYCODONE HCL 5 MG/5ML PO SOLN: 5.0000 mg | Freq: Once | ORAL | Status: DC | PRN

## 2015-02-12 MED ORDER — ONDANSETRON HCL 4 MG/2ML IJ SOLN: 4.0000 mg | INTRAMUSCULAR | Status: DC | PRN

## 2015-02-12 MED ORDER — ALBUTEROL SULFATE (2.5 MG/3ML) 0.083% IN NEBU
INHALATION_SOLUTION | RESPIRATORY_TRACT | Status: AC
  Filled 2015-02-12: qty 3

## 2015-02-12 MED ORDER — POLYETHYLENE GLYCOL 3350 17 G PO PACK: 17.0000 g | PACK | Freq: Every day | ORAL | Status: DC | PRN

## 2015-02-12 MED ORDER — HYDROMORPHONE HCL 1 MG/ML IJ SOLN
INTRAMUSCULAR | Status: AC
  Administered 2015-02-12: 0.25 mg via INTRAVENOUS
  Filled 2015-02-12: qty 1

## 2015-02-12 MED ORDER — BUPIVACAINE HCL 0.5 % IJ SOLN
INTRAMUSCULAR | Status: DC | PRN
  Administered 2015-02-12: 20 mL

## 2015-02-12 MED ORDER — THROMBIN 20000 UNITS EX KIT: PACK | CUTANEOUS | Status: DC | PRN

## 2015-02-12 MED ORDER — LACTATED RINGERS IV SOLN
INTRAVENOUS | Status: DC
  Administered 2015-02-12: 13:00:00 via INTRAVENOUS

## 2015-02-12 MED ORDER — BUPIVACAINE LIPOSOME 1.3 % IJ SUSP
20.0000 mL | INTRAMUSCULAR | Status: DC
  Filled 2015-02-12: qty 20

## 2015-02-12 MED ORDER — DOCUSATE SODIUM 100 MG PO CAPS
100.0000 mg | ORAL_CAPSULE | Freq: Two times a day (BID) | ORAL | Status: DC
  Administered 2015-02-12 – 2015-02-14 (×4): 100 mg via ORAL
  Filled 2015-02-12 (×4): qty 1

## 2015-02-12 MED ORDER — MIDAZOLAM HCL 5 MG/5ML IJ SOLN
INTRAMUSCULAR | Status: DC | PRN
  Administered 2015-02-12: 2 mg via INTRAVENOUS

## 2015-02-12 MED ORDER — HYDROXYZINE HCL 10 MG PO TABS
20.0000 mg | ORAL_TABLET | Freq: Every day | ORAL | Status: DC
  Administered 2015-02-12 – 2015-02-13 (×2): 20 mg via ORAL
  Filled 2015-02-12 (×3): qty 2

## 2015-02-12 MED ORDER — SODIUM CHLORIDE 0.9 % IV SOLN: 250.0000 mL | INTRAVENOUS | Status: DC

## 2015-02-12 MED ORDER — MIDAZOLAM HCL 2 MG/2ML IJ SOLN
INTRAMUSCULAR | Status: AC
  Filled 2015-02-12: qty 2

## 2015-02-12 MED ORDER — THROMBIN 20000 UNITS EX SOLR
CUTANEOUS | Status: DC | PRN
  Administered 2015-02-12: 15:00:00 via TOPICAL

## 2015-02-12 MED ORDER — FLEET ENEMA 7-19 GM/118ML RE ENEM: 1.0000 | ENEMA | Freq: Once | RECTAL | Status: AC | PRN

## 2015-02-12 MED ORDER — PROPOFOL 10 MG/ML IV BOLUS
INTRAVENOUS | Status: DC | PRN
  Administered 2015-02-12: 200 mg via INTRAVENOUS

## 2015-02-12 MED ORDER — METHOCARBAMOL 1000 MG/10ML IJ SOLN
500.0000 mg | Freq: Four times a day (QID) | INTRAVENOUS | Status: DC | PRN
  Filled 2015-02-12: qty 5

## 2015-02-12 MED ORDER — ROCURONIUM BROMIDE 100 MG/10ML IV SOLN
INTRAVENOUS | Status: DC | PRN
  Administered 2015-02-12: 40 mg via INTRAVENOUS

## 2015-02-12 MED ORDER — BISACODYL 5 MG PO TBEC: 5.0000 mg | DELAYED_RELEASE_TABLET | Freq: Every day | ORAL | Status: DC | PRN

## 2015-02-12 MED ORDER — PROPOFOL 10 MG/ML IV BOLUS
INTRAVENOUS | Status: AC
  Filled 2015-02-12: qty 20

## 2015-02-12 MED ORDER — MENTHOL 3 MG MT LOZG
1.0000 | LOZENGE | OROMUCOSAL | Status: DC | PRN
  Administered 2015-02-13 – 2015-02-14 (×2): 3 mg via ORAL
  Filled 2015-02-12 (×2): qty 9

## 2015-02-12 MED ORDER — CEFAZOLIN SODIUM 1-5 GM-% IV SOLN
1.0000 g | Freq: Three times a day (TID) | INTRAVENOUS | Status: AC
  Administered 2015-02-12 – 2015-02-13 (×2): 1 g via INTRAVENOUS
  Filled 2015-02-12 (×2): qty 50

## 2015-02-12 MED ORDER — HYDROMORPHONE HCL 1 MG/ML IJ SOLN
0.2500 mg | INTRAMUSCULAR | Status: DC | PRN
  Administered 2015-02-12 (×4): 0.25 mg via INTRAVENOUS

## 2015-02-12 MED ORDER — FENTANYL CITRATE 0.05 MG/ML IJ SOLN
INTRAMUSCULAR | Status: DC | PRN
  Administered 2015-02-12: 50 ug via INTRAVENOUS
  Administered 2015-02-12 (×2): 100 ug via INTRAVENOUS

## 2015-02-12 MED ORDER — SODIUM CHLORIDE 0.9 % IV SOLN
INTRAVENOUS | Status: DC
  Administered 2015-02-12: 23:00:00 via INTRAVENOUS

## 2015-02-12 MED ORDER — GLYCOPYRROLATE 0.2 MG/ML IJ SOLN
INTRAMUSCULAR | Status: DC | PRN
  Administered 2015-02-12: 0.4 mg via INTRAVENOUS

## 2015-02-12 MED ORDER — TIOTROPIUM BROMIDE MONOHYDRATE 18 MCG IN CAPS
18.0000 ug | ORAL_CAPSULE | Freq: Every day | RESPIRATORY_TRACT | Status: DC
  Administered 2015-02-14: 18 ug via RESPIRATORY_TRACT
  Filled 2015-02-12: qty 5

## 2015-02-12 MED ORDER — METHOCARBAMOL 500 MG PO TABS
500.0000 mg | ORAL_TABLET | Freq: Four times a day (QID) | ORAL | Status: DC | PRN
  Administered 2015-02-14 (×2): 500 mg via ORAL
  Filled 2015-02-12 (×2): qty 1

## 2015-02-12 MED ORDER — BUDESONIDE-FORMOTEROL FUMARATE 160-4.5 MCG/ACT IN AERO
2.0000 | INHALATION_SPRAY | Freq: Two times a day (BID) | RESPIRATORY_TRACT | Status: DC
  Administered 2015-02-14: 2 via RESPIRATORY_TRACT
  Filled 2015-02-12: qty 6

## 2015-02-12 MED ORDER — ACETAMINOPHEN 325 MG PO TABS
ORAL_TABLET | ORAL | Status: AC
  Filled 2015-02-12: qty 2

## 2015-02-12 MED ORDER — BUPIVACAINE LIPOSOME 1.3 % IJ SUSP
INTRAMUSCULAR | Status: DC | PRN
  Administered 2015-02-12: 20 mL

## 2015-02-12 MED ORDER — OXYCODONE HCL 5 MG PO TABS: 5.0000 mg | ORAL_TABLET | Freq: Once | ORAL | Status: DC | PRN

## 2015-02-12 MED ORDER — 0.9 % SODIUM CHLORIDE (POUR BTL) OPTIME
TOPICAL | Status: DC | PRN
  Administered 2015-02-12: 1000 mL

## 2015-02-12 MED ORDER — HYDROCODONE-ACETAMINOPHEN 5-325 MG PO TABS: 1.0000 | ORAL_TABLET | ORAL | Status: DC | PRN

## 2015-02-12 MED ORDER — DARIFENACIN HYDROBROMIDE ER 15 MG PO TB24
15.0000 mg | ORAL_TABLET | Freq: Every day | ORAL | Status: DC
  Administered 2015-02-12 – 2015-02-14 (×3): 15 mg via ORAL
  Filled 2015-02-12 (×3): qty 1

## 2015-02-12 MED ORDER — MORPHINE SULFATE 2 MG/ML IJ SOLN
1.0000 mg | INTRAMUSCULAR | Status: DC | PRN
  Administered 2015-02-12 – 2015-02-13 (×2): 4 mg via INTRAVENOUS
  Filled 2015-02-12 (×2): qty 2

## 2015-02-12 MED ORDER — NEOSTIGMINE METHYLSULFATE 10 MG/10ML IV SOLN
INTRAVENOUS | Status: DC | PRN
Start: 2015-02-12 — End: 2015-02-12
  Administered 2015-02-12: 3 mg via INTRAVENOUS

## 2015-02-12 SURGICAL SUPPLY — 50 items
BUR ROUND FLUTED 4 SOFT TCH (BURR) IMPLANT
BUR ROUND FLUTED 4MM SOFT TCH (BURR)
CANISTER SUCTION 2500CC (MISCELLANEOUS) ×3 IMPLANT
CORDS BIPOLAR (ELECTRODE) ×3 IMPLANT
COVER SURGICAL LIGHT HANDLE (MISCELLANEOUS) ×3 IMPLANT
DERMABOND ADVANCED (GAUZE/BANDAGES/DRESSINGS) ×2
DERMABOND ADVANCED .7 DNX12 (GAUZE/BANDAGES/DRESSINGS) ×1 IMPLANT
DRAPE INCISE IOBAN 66X45 STRL (DRAPES) IMPLANT
DRAPE MICROSCOPE LEICA (MISCELLANEOUS) ×3 IMPLANT
DRAPE PROXIMA HALF (DRAPES) IMPLANT
DRAPE SURG 17X23 STRL (DRAPES) ×12 IMPLANT
DRSG MEPILEX BORDER 4X4 (GAUZE/BANDAGES/DRESSINGS) ×3 IMPLANT
DRSG MEPILEX BORDER 4X8 (GAUZE/BANDAGES/DRESSINGS) IMPLANT
DURAPREP 26ML APPLICATOR (WOUND CARE) ×3 IMPLANT
DURASEAL SPINE SEALANT 3ML (MISCELLANEOUS) ×3 IMPLANT
ELECT REM PT RETURN 9FT ADLT (ELECTROSURGICAL) ×3
ELECTRODE REM PT RTRN 9FT ADLT (ELECTROSURGICAL) ×1 IMPLANT
EVACUATOR 1/8 PVC DRAIN (DRAIN) IMPLANT
GLOVE BIOGEL PI IND STRL 8 (GLOVE) ×1 IMPLANT
GLOVE BIOGEL PI INDICATOR 8 (GLOVE) ×2
GLOVE ECLIPSE 9.0 STRL (GLOVE) ×3 IMPLANT
GLOVE ORTHO TXT STRL SZ7.5 (GLOVE) ×3 IMPLANT
GLOVE SURG 8.5 LATEX PF (GLOVE) ×3 IMPLANT
GOWN STRL REUS W/ TWL LRG LVL3 (GOWN DISPOSABLE) ×1 IMPLANT
GOWN STRL REUS W/TWL 2XL LVL3 (GOWN DISPOSABLE) ×3 IMPLANT
GOWN STRL REUS W/TWL LRG LVL3 (GOWN DISPOSABLE) ×2
KIT BASIN OR (CUSTOM PROCEDURE TRAY) ×3 IMPLANT
KIT ROOM TURNOVER OR (KITS) ×3 IMPLANT
NEEDLE SPNL 18GX3.5 QUINCKE PK (NEEDLE) ×6 IMPLANT
NS IRRIG 1000ML POUR BTL (IV SOLUTION) ×3 IMPLANT
PACK LAMINECTOMY ORTHO (CUSTOM PROCEDURE TRAY) ×3 IMPLANT
PAD ARMBOARD 7.5X6 YLW CONV (MISCELLANEOUS) ×6 IMPLANT
PATTIES SURGICAL .5 X.5 (GAUZE/BANDAGES/DRESSINGS) IMPLANT
PATTIES SURGICAL .75X.75 (GAUZE/BANDAGES/DRESSINGS) IMPLANT
PATTIES SURGICAL 1X1 (DISPOSABLE) IMPLANT
SPONGE LAP 4X18 X RAY DECT (DISPOSABLE) IMPLANT
SPONGE SURGIFOAM ABS GEL 100 (HEMOSTASIS) IMPLANT
SUT NURALON 4 0 TR CR/8 (SUTURE) ×3 IMPLANT
SUT VIC AB 0 CT1 27 (SUTURE)
SUT VIC AB 0 CT1 27XBRD ANBCTR (SUTURE) IMPLANT
SUT VIC AB 1 CT1 27 (SUTURE)
SUT VIC AB 1 CT1 27XBRD ANBCTR (SUTURE) IMPLANT
SUT VIC AB 2-0 CT1 27 (SUTURE)
SUT VIC AB 2-0 CT1 TAPERPNT 27 (SUTURE) IMPLANT
SUT VIC AB 3-0 FS2 27 (SUTURE) ×3 IMPLANT
SUT VICRYL 0 UR6 27IN ABS (SUTURE) IMPLANT
TOWEL OR 17X24 6PK STRL BLUE (TOWEL DISPOSABLE) ×3 IMPLANT
TOWEL OR 17X26 10 PK STRL BLUE (TOWEL DISPOSABLE) ×3 IMPLANT
TRAY FOLEY CATH 16FRSI W/METER (SET/KITS/TRAYS/PACK) IMPLANT
WATER STERILE IRR 1000ML POUR (IV SOLUTION) ×3 IMPLANT

## 2015-02-12 NOTE — Interval H&P Note (Signed)
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 After consideration of risks, benefits and other options for treatment, the patient has consented to  Procedure(s): RE-DO LEFT L3-4 MICRODISCECTOMY (N/A) as a surgical intervention .  The patient's history has been reviewed, patient examined, no change in status, stable for surgery.  I have reviewed the patient's chart and labs.  Questions were answered to the patient's satisfaction.     NITKA,JAMES E

## 2015-02-12 NOTE — H&P (Signed)
Eddie Carter is an 74 y.o. male.    Eddie Carter is seen today for review of his MRI study.  The MRI scan was done on 01/15/2015.  Eddie Carter has complained of some persistent discomfort in his back and radiation of the left lower extremity.  He did well following initial surgical decompression on the left side at L3-4 level.  Here he had a large disk herniation and underwent debridement of the disk herniation and disk.  He has had trouble, though, with postoperative recovery and persistent pain and persistent discomfort.  He has been taking narcotic medicine since the surgery on 11/05/2014.  He underwent a repeat MRI scan, because of persistence of his symptoms and problems with left lower extremity weakness despite having undergone what was felt to be a successful procedure.  The results of his MRI scan are available.       Past Medical History  Diagnosis Date  . COPD (chronic obstructive pulmonary disease)   . Shortness of breath   . Asthma   . Arthritis   . GERD (gastroesophageal reflux disease)     no meds  . History of GI bleed   . HOH (hard of hearing)   . Full dentures   . Traumatic tear of lateral meniscus of right knee     right knee  . Medial meniscus, posterior horn derangement     left knee  . Diabetes mellitus without complication   . Headache     difficulty with headaches- 1950's , again in 1967, 2008- again problems with migrraines   . Cancer     face- basal cell  . Hypertension   . Complication of anesthesia     after septoplasty and uvulectomy-was in icu- [facility] - 10 yrs. ago  . Stroke 1995    some speech and thought processes slow  . Sleep apnea     uses a cpap, most nights   . Pneumonia 2014    [facility]    Past Surgical History  Procedure Laterality Date  . Foot foreign body removal  1976    left foot -multiple pieces glass  . Appendectomy  1963  . Hemorroidectomy  1995  . Carpal tunnel release      left  . Colonoscopy    . Upper gi endoscopy    .  Nasal septum surgery  1995  . Uvulopalatopharyngoplasty (uppp)/tonsillectomy/septoplasty  1995    same time with septoplasty-ended up ICU almost trached  . Tonsillectomy    . Knee arthroscopy Bilateral 01/17/2013    Procedure: ARTHROSCOPY KNEE BILATERAL WITH MEDIAL AND LATERAL MENISECTOMIES;  Surgeon: Lorn Junes, MD;  Location: [facility];  Service: Orthopedics;  Laterality: Bilateral;  . Eye surgery      foreign body removed fr. eye, ? side   . Lumbar laminectomy N/A 11/05/2014    Procedure: LEFT L3-4 MICRODISCECTOMY;  Surgeon: Jessy Oto, MD;  Location: [facility];  Service: Orthopedics;  Laterality: N/A;    Family History  Problem Relation Age of Onset  . Cancer Mother   . Heart attack Father    Social History:  reports that he quit smoking about 19 years ago. His smoking use included Cigarettes. He has a 60 pack-year smoking history. He has never used smokeless tobacco. He reports that he drinks alcohol. He reports that he does not use illicit drugs.  Allergies:  Allergies  Allergen Reactions  . Asa [Aspirin] Shortness Of Breath  . Nsaids Other (See Comments)  Makes him bleed.    Medications Prior to Admission  Medication Sig Dispense Refill  . Cholecalciferol (VITAMIN D) 1000 UNITS capsule Take 3,000 Units by mouth daily.    . hydrOXYzine (ATARAX/VISTARIL) 10 MG tablet Take 2 tablets by mouth at bedtime.    . solifenacin (VESICARE) 10 MG tablet Take 10 mg by mouth daily as needed.     Marland Kitchen acetaminophen (TYLENOL) 500 MG tablet Take by mouth every 6 (six) hours as needed.    . budesonide-formoterol (SYMBICORT) 160-4.5 MCG/ACT inhaler Inhale 2 puffs into the lungs 2 (two) times daily. (Patient not taking: Reported on 02/06/2015) 3 Inhaler 1  . fluticasone (FLONASE) 50 MCG/ACT nasal spray Place 2 sprays into both nostrils daily. (Patient not taking: Reported on 02/06/2015) 16 g 5  . methocarbamol (ROBAXIN) 500 MG tablet Take 1 tablet (500 mg total) by mouth every 6  (six) hours as needed for muscle spasms. (Patient not taking: Reported on 02/06/2015) 40 tablet 1  . tiotropium (SPIRIVA) 18 MCG inhalation capsule Place 1 capsule (18 mcg total) into inhaler and inhale daily. (Patient not taking: Reported on 02/06/2015) 90 capsule 1    No results found for this or any previous visit (from the past 48 hour(s)). No results found.  Review of Systems  Unable to perform ROS Constitutional: Negative.   HENT: Negative.   Eyes: Negative.   Respiratory: Negative.   Cardiovascular: Negative.   Gastrointestinal: Negative.   Genitourinary: Negative.   Musculoskeletal: Negative.   Skin: Negative.   Neurological: Positive for tingling, sensory change and focal weakness. Negative for dizziness, tremors, seizures and loss of consciousness.  Endo/Heme/Allergies: Negative.   Psychiatric/Behavioral: Negative.     Blood pressure 126/62, pulse 67, temperature 97.4 F (36.3 C), temperature source Oral, resp. rate 20, weight 137.44 kg (303 lb), SpO2 96 %. Physical Exam  Constitutional: He is oriented to person, place, and time. He appears well-developed and well-nourished.  HENT:  Head: Normocephalic and atraumatic.  Right Ear: External ear normal.  Left Ear: External ear normal.  Nose: Nose normal.  Mouth/Throat: Oropharynx is clear and moist. No oropharyngeal exudate.  Eyes: Conjunctivae and EOM are normal. Pupils are equal, round, and reactive to light. Right eye exhibits no discharge. Left eye exhibits no discharge. No scleral icterus.  Neck: Normal range of motion. Neck supple. No JVD present. No tracheal deviation present. No thyromegaly present.  Cardiovascular: Normal rate, regular rhythm, normal heart sounds and intact distal pulses.  Exam reveals no gallop and no friction rub.   No murmur heard. Respiratory: Effort normal and breath sounds normal. No respiratory distress. He has no wheezes. He has no rales. He exhibits no tenderness.  GI: Soft. Bowel sounds  are normal. He exhibits no distension and no mass. There is no tenderness. There is no rebound and no guarding.  Musculoskeletal: Normal range of motion. He exhibits no edema or tenderness.  Lymphadenopathy:    He has no cervical adenopathy.  Neurological: He is alert and oriented to person, place, and time. He has normal reflexes. He displays normal reflexes. No cranial nerve deficit. He exhibits normal muscle tone. Coordination normal.  Skin: Skin is warm and dry. No rash noted. No erythema. No pallor.  Psychiatric: He has a normal mood and affect. His behavior is normal. Judgment and thought content normal.    PHYSICAL EXAMINATION:  Clinically he has atrophy of the left thigh and weakness in left knee extension.  His reflex of the left knee is trace and the  right is 2+ and at the ankle 2+ and symmetric.  No weakness in left foot dorsiflexion present.    RADIOGRAPHS:  The MRI scan shows a large recurrent disk herniation at left L3-4.  This appears to be impressing upon the left L3 and L4 nerve roots.  It seems to be source of his increased pain and discomfort.  There is a sequestered fragment within the lateral recess and this is at the area of the laminotomy and certainly it suggests that this is a recurrent disk herniation.  He has had severe left lateral recess and left L3 foraminal stenosis.  Some left joint fluid that is present at the L3 level.    ASSESSMENT:  My impression is that this patient has a recurrent HNP with persistence of weakness in the left leg.  If he had no significant persistent nerve compression then I would be willing to say that therapy would be the way to treat him, but as he has got findings of a large recurrent disk herniation and persistent weakness and persistent pain I think that a repeat microdiscectomy is recommended at this point.     PLAN:  I will go ahead and schedule Williom to undergo a repeat microdiscectomy at L3-4.  The risk of surgery including risk of  infection, bleeding and neurovascular compromise were discussed with Camila Li and he wishes to proceed.      OWENS,Haliyah Fryman M 02/12/2015, 12:00 PM

## 2015-02-12 NOTE — Anesthesia Postprocedure Evaluation (Signed)
  Anesthesia Post-op Note  Patient: Eddie Carter  Procedure(s) Performed: Procedure(s): RE-DO LEFT L3-4 MICRODISCECTOMY (N/A)  Patient Location: PACU  Anesthesia Type: General   Level of Consciousness: awake, alert  and oriented  Airway and Oxygen Therapy: Patient Spontanous Breathing  Post-op Pain: mild  Post-op Assessment: Post-op Vital signs reviewed  Post-op Vital Signs: Reviewed  Last Vitals:  Filed Vitals:   02/12/15 1915  BP:   Pulse: 86  Temp:   Resp: 18    Complications: No apparent anesthesia complications

## 2015-02-12 NOTE — Anesthesia Preprocedure Evaluation (Signed)
Anesthesia Evaluation  Patient identified by MRN, date of birth, ID band Patient awake    Reviewed: Allergy & Precautions, NPO status , Patient's Chart, lab work & pertinent test results  Airway Mallampati: II  TM Distance: >3 FB Neck ROM: Full    Dental  (+) Upper Dentures, Lower Dentures   Pulmonary sleep apnea and Continuous Positive Airway Pressure Ventilation , former smoker,  breath sounds clear to auscultation        Cardiovascular hypertension, Pt. on medications Rhythm:Regular Rate:Normal     Neuro/Psych    GI/Hepatic   Endo/Other  diabetesMorbid obesity  Renal/GU      Musculoskeletal   Abdominal   Peds  Hematology   Anesthesia Other Findings   Reproductive/Obstetrics                             Anesthesia Physical Anesthesia Plan  ASA: III  Anesthesia Plan: General   Post-op Pain Management:    Induction: Intravenous  Airway Management Planned: Oral ETT  Additional Equipment:   Intra-op Plan:   Post-operative Plan: Extubation in OR  Informed Consent: I have reviewed the patients History and Physical, chart, labs and discussed the procedure including the risks, benefits and alternatives for the proposed anesthesia with the patient or authorized representative who has indicated his/her understanding and acceptance.   Dental advisory given  Plan Discussed with: CRNA, Anesthesiologist and Surgeon  Anesthesia Plan Comments:         Anesthesia Quick Evaluation

## 2015-02-12 NOTE — Anesthesia Procedure Notes (Signed)
Procedure Name: Intubation Date/Time: 02/12/2015 1:29 PM Performed by: Shirlyn Goltz Pre-anesthesia Checklist: Patient identified, Emergency Drugs available, Suction available and Patient being monitored Patient Re-evaluated:Patient Re-evaluated prior to inductionOxygen Delivery Method: Circle system utilized Preoxygenation: Pre-oxygenation with 100% oxygen Intubation Type: IV induction Ventilation: Mask ventilation with difficulty, Two handed mask ventilation required and Oral airway inserted - appropriate to patient size Laryngoscope Size: Glidescope and 4 (elective based on previous anesthesia records) Grade View: Grade I Tube type: Oral Tube size: 7.0 mm Number of attempts: 1 Airway Equipment and Method: Stylet and Video-laryngoscopy Placement Confirmation: ETT inserted through vocal cords under direct vision and positive ETCO2 Secured at: 21 cm Tube secured with: Tape Dental Injury: Teeth and Oropharynx as per pre-operative assessment

## 2015-02-12 NOTE — Brief Op Note (Signed)
02/12/2015  3:27 PM  PATIENT:  Eddie Carter  74 y.o. 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  SURGEON:  Surgeon(s) and Role:    * Jessy Oto, MD - Primary  PHYSICIAN ASSISTANT:James Ricard Dillon, PA-C   ANESTHESIA:   local and general Dr.Crews..  EBL:  Total I/O In: -  Out: 200 [Blood:200]  BLOOD ADMINISTERED:none  DRAINS: none   LOCAL MEDICATIONS USED:  MARCAINE 0.5% 1:1 EXPAREL 1.3% Amount: 20 ml  SPECIMEN:  No Specimen  DISPOSITION OF SPECIMEN:  N/A  COUNTS:  YES  COMPLICATION: Dural tear posterior left midline level of L3 foramen.  DICTATION: IT trainer Dictation  PLAN OF CARE: Admit for overnight observation  PATIENT DISPOSITION:  PACU - hemodynamically stable.   Delay start of Pharmacological VTE agent (>24hrs) due to surgical blood loss or risk of bleeding: yes

## 2015-02-12 NOTE — Transfer of Care (Signed)
Immediate Anesthesia Transfer of Care Note  Patient: Eddie Carter  Procedure(s) Performed: Procedure(s): RE-DO LEFT L3-4 MICRODISCECTOMY (N/A)  Patient Location: PACU  Anesthesia Type:General  Level of Consciousness: awake, alert , oriented and patient cooperative  Airway & Oxygen Therapy: Patient Spontanous Breathing and Patient connected to face mask oxygen  Post-op Assessment: Report given to RN, Post -op Vital signs reviewed and stable, Patient moving all extremities and Patient moving all extremities X 4  Post vital signs: Reviewed and stable  Last Vitals:  Filed Vitals:   02/12/15 1603  BP:   Pulse:   Temp: 36.9 C  Resp:     Complications: No apparent anesthesia complications

## 2015-02-12 NOTE — Op Note (Signed)
02/12/2015  3:33 PM  PATIENT:  Eddie Carter  74 y.o. 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    SURGEON:  Jessy Oto, MD     ASSISTANT:  Benjiman Core, PA-C  (Present throughout the entire procedure and necessary for completion of procedure in a timely manner)    ANESTHESIA:  General, supplemented with local marcaine 0.5% 1:1 exparel 1.3% total 20cc used. Dr. Al Corpus.    COMPLICATIONS:  Left L3 posterior midline 3 mm durotomy, repaired with 3 x 4-O neurolon and Duraseal.       PROCEDURE:The patient was met in the holding area, and the appropriate left Lumbar level L3-4 identified and marked with "x" and my initials.The patient was then transported to OR and was placed under general anesthesia without difficulty. The patient received appropriate preoperative antibiotic prophylaxis. The patient after intubation atraumatically was transferred to the operating room table, prone position, Wilson frame, sliding OR table. All pressure points were well padded. The arms in 90-90 well-padded at the elbows. Standard prep with DuraPrep solution lower dorsal spine to the mid sacral segment. Draped in the usual manner iodine Vi-Drape was used. Time-out procedure was called and correct. The skin incision scar at L3-4 marked and was infiltrated with Marcaine half percent  1:1 exparel 1.3% total of 10 cc used. An incision approximately 2 1/2 inches in length was then made through skin and subcutaneous layers ellipsing the old skin incision in line with the left side of the expected midline. An incision made into the left lumbosacral fascia approximately an inch in length along the left L3-4 spinous processes. The paraspinal muscles elevated from  the left side of the spinous processes and lamina of L3 and L4 Boss McCollough retractor then placed with 70 mm blade. The operating room microscope sterilely draped brought into the field. Under the operating room microscope, the L3-4 interspace carefully debrided the small amount of muscle attachment here and microcurettes used to define the previous laminotomy left L3-4. A high-speed bur used to drill the medial aspect of the inferior articular process of L3. In order to medialize the laminotomy the left side of the L3 spinous process was thinned using a high speed bur. 2 mm Kerrison then used to enter the spinal canal over the medial aspect of the L4 inferior articular process carefully using the Kerrison to debris the attachment as a curet. Foraminotomy was then performed over the left L4 nerve root. The medial 10% superior articular process of L4 then resected using 2 mm Kerrison. This allowed for identification of the thecal sac. Penfield 4 was then used to carefully mobilize the thecal sac medially and the left L5 nerve root identified within the lateral recess flattened over the posterior aspect of the herniated disc. Carefully the lateral aspect of the L5 nerve root was identified and a Penfield 4 was used to mobilize the nerve medially such that the herniated disc was visible with microscope. A penfield # 4 was placed into the laminotomy and lateral cross table lateral xray showed the Penfield localized to the posterior vertebral body of L3 above the L3-4 disc space. Using a Derricho for retraction and a Gaspar Garbe #4 was used to define the rent in the posterior longitudinal ligament within the lateral recess on the left  side longitudinally. Disc material immediately extruded and this was removed using micropituitary rongeurs and nerve hook nerve root and then more easily able to be mobilized medially and retracted using a Derricho retractor. Further foraminotomies was performed over the L4 nerve root  the nerve root was noted to be exiting without further compression. The nerve root able to be retracted along the medial aspect of the L4 pedicle and disc material found to be subligamentous at this level was further resected current pituitary rongeurs. Disc material found to be extending superiorly along the posterior aspect of the left L3 vertebral body ventral to the left thecal sac and the axillary portion of the left L3 nerve root. While freeing scar over the dorsal thecal sac with the penfield #4 a durotomy occurred about 3 mm in length in line with the longitudinal fibers of the dorsal thecal sac.  A cottonoid applied over the opening and a further portion of the inferior lamina of left L3 was resected to allow for inspection of the dural tear and for repair. Under the sterile OR microscope the dural tear was repaired using 3x4-O Neurolon simple sutures. Valsalva to 30 mm HG showed no leak.  With this then the disc space at L3-4 was easily visualized and entry into the disc at the sided disc herniation was possible using a Penfield 4.  Micropituitary was used to further debride this material superficially from the posterior aspect of the intervertebral disc is posterior lateral aspect of the disc. A large amount of further disc material was found subligamentous extending superiorly from the disc this was removed using micropituitary rongeurs. Ligamentum flavum was debrided and lateral recess along the medial aspect L3-4 facet no further decompression was necessary. Ball tip nerve probe was then able to carefully palpate the neuroforamen for L3 and L4 finding these to be well decompressed. Bleeding was then controlled using thrombin-soaked Gelfoam small cottonoids. Small amount of bleeding within the soft tissue mass the laminotomy area was controlled using bipolar electrocautery. Irrigation was carried out using copious amounts of irrigant solution.No significant active bleeding present at the time of  closure.  Prior to closure and after irrigation the the dural was sealed with DuraSeal about 2 cc used. All instruments sponge counts were correct traction system was then carefully removed. Lumbodorsal fascia was then carefully approximated with interrupted 0 Vicryl sutures with a UR 6 needle, the deep subcutaneous layers were approximated with interrupted 0 Vicryl sutures on UR 6 the appear subcutaneous layers approximated with interrupted 2-0 Vicryl sutures and the skin closed with a running subcutaneous stitch of 4-0 Vicryl. Dermabond was applied allowed to dry and then Mepilex bandage applied. Patient was then carefully returned to supine position on a stretcher, reactivated and extubated. She was then returned to recovery room in satisfactory condition.  Benjiman Core, PA-C perform the duties of assistant surgeon during this case. He was present from the beginning of the case to the end of the case assisting in transfer the patient from his stretcher to the OR table and back to the stretcher at the end of the case. Assisted in careful retraction and suction of the laminectomy site delicate neural structures operating under the operating room microscope. He also assisted with closure of the incision from the fascia to the skin applying the dressing.        Messiah Ahr E  02/12/2015, 3:33 PM

## 2015-02-12 NOTE — Discharge Instructions (Signed)
° ° °  No lifting greater than 10 lbs. Avoid bending, stooping and twisting. Walk in house for first week them may start to get out slowly increasing distance up to 1/2 mile by 3 weeks post op. Keep incision dry for 3 days, may use tegaderm or similar water impervious dressing.

## 2015-02-13 ENCOUNTER — Encounter (HOSPITAL_COMMUNITY): Payer: Self-pay | Admitting: Specialist

## 2015-02-13 DIAGNOSIS — M5116 Intervertebral disc disorders with radiculopathy, lumbar region: Secondary | ICD-10-CM | POA: Diagnosis not present

## 2015-02-13 MED ORDER — PANTOPRAZOLE SODIUM 40 MG PO TBEC
40.0000 mg | DELAYED_RELEASE_TABLET | Freq: Every day | ORAL | Status: DC
Start: 2015-02-13 — End: 2015-02-14
  Administered 2015-02-13: 40 mg via ORAL
  Filled 2015-02-13: qty 1

## 2015-02-13 NOTE — Progress Notes (Signed)
Patient arrived to 4N16 from PACU. Patient alert and oriented x 4. Complaining of mild headache. Wife at bedside. Will continue to monitor patient closely, Burnell Blanks, RN

## 2015-02-13 NOTE — Progress Notes (Signed)
Pt declined to ambulate in the hall, stated "it hurts so bad".  Pain medication given.  Will continue to monitor. Cori Razor, RN

## 2015-02-13 NOTE — Progress Notes (Signed)
Nutrition Brief Note  Patient identified on the Malnutrition Screening Tool (MST) Report  Wt Readings from Last 15 Encounters:  02/12/15 303 lb (137.44 kg)  11/05/14 309 lb (140.161 kg)  10/25/13 340 lb (154.223 kg)  09/25/13 309 lb (140.161 kg)  08/22/13 330 lb (149.687 kg)    Body mass index is 41.09 kg/(m^2). Patient meets criteria for Morbid Obesity based on current BMI. RD familiar with patient from previous admission. Pt happy with weight loss. Additional 2% weight loss (6 lbs) in past 3 months.   Current diet order is Low Sodium/Heart Healthy, patient is consuming approximately 100% of meals at this time. Labs and medications reviewed.   No nutrition interventions warranted at this time. If nutrition issues arise, please consult RD.   Pryor Ochoa RD, LDN Inpatient Clinical Dietitian Pager: [phone] After Hours Pager: [phone]

## 2015-02-13 NOTE — Care Management Note (Addendum)
    Page 1 of 1   02/14/2015     2:09:21 PM CARE MANAGEMENT NOTE 02/14/2015  Patient:  Eddie Carter, Eddie Carter   Account Number:  [id number]  Date Initiated:  02/13/2015  Documentation initiated by:  Lorne Skeens  Subjective/Objective Assessment:   Patient was admitted for Lumbar Micordiscetomy.  Lives at home with wife.     Action/Plan:   Will follow for discharge needs.   Anticipated DC Date:  02/13/2015   Anticipated DC Plan:  [place]  CM consult      Choice offered to / List presented to:  C-1 Patient        [place] arranged  [place]   Status of service:   Medicare Important Message given?   (If response is "NO", the following Medicare IM given date fields will be blank) Date Medicare IM given:   Medicare IM given by:   Date Additional Medicare IM given:   Additional Medicare IM given by:    Discharge Disposition:  [place]  Per UR Regulation:  Reviewed for med. necessity/level of care/duration of stay  If discussed at [place] of Stay Meetings, dates discussed:    Comments:  02/14/15 [place], MSN, CM- Discharge disposition is now SNF, [facility]  Mary with [facility] was notified of change in discharge plan.   02/13/15 [place], MSN, CM- Met with patient and wife to discuss discharge needs. Patient has chosen [facility] for HHPT, which he has used in the past.  Stanton Kidney with [facility] was notified and has accepted the referral for potential discharge home today.  Address and phone number were verified with patient.  Patient states that he has DME from previous surgery and does not need any additional DME items at this time.

## 2015-02-13 NOTE — Progress Notes (Signed)
UR completed 

## 2015-02-13 NOTE — Evaluation (Signed)
Occupational Therapy Evaluation Patient Details Name: Eddie Carter MRN: [id number] DOB: April 28, 1941 Today's Date: 02/13/2015    History of Present Illness Patient is a 74 y/o male s/p redo L3-4 microdiscectomy. PMH of COPD, asthma, arthritis, GID, CVA, HA, HTN and DM.   Clinical Impression   Pt admitted with above. He demonstrates the below listed deficits and will benefit from continued OT to maximize safety and independence with BADLs.  Pt moves very slowly with significant delay initiating activities.  He is unable to complete his thoughts/sentences at times, and requires max cues for sequencing and problem solving - pt reports he was working full time until December so this appears to be a significant change from his baseline - unsure if meds might be contributing.  He required mod A for stand pivot transfer to bed with heavy lean to Rt in both sitting and standing which he corrected with time and cuing .  No focal weakness noted.  Currently, he requires max - total A for BADLs.  Recommend SNF level rehab at discharge.       Follow Up Recommendations  SNF    Equipment Recommendations  None recommended by OT    Recommendations for Other Services       Precautions / Restrictions Precautions Precautions: Fall;Back Precaution Booklet Issued: Yes (comment) Precaution Comments: Pt requires max verbal cues for precautions  Restrictions Weight Bearing Restrictions: No      Mobility Bed Mobility Overal bed mobility: Needs Assistance Bed Mobility: Sit to Sidelying Rolling: Mod assist Sidelying to sit: Max assist;HOB elevated     Sit to sidelying: Mod assist General bed mobility comments: Requires max verbal cues for precautions and sequencing.  Mod A to lift LEs onto bed   Transfers Overall transfer level: Needs assistance Equipment used: Rolling walker (2 wheeled) Transfers: Sit to/from [facility] Sit to Stand: Mod assist Stand pivot transfers: Mod  assist       General transfer comment: Pt very slow to move.  Mod A to move into standing and mod A to pivot to bed     Balance Overall balance assessment: Needs assistance Sitting-balance support: Feet supported Sitting balance-Leahy Scale: Poor Sitting balance - Comments: Pt leans to Rt.  Initially unable to correct and required mod A to maintain balance. Pt progressed to min guard assist Postural control: Right lateral lean Standing balance support: Bilateral upper extremity supported Standing balance-Leahy Scale: Poor Standing balance comment: requires mod A                             ADL Overall ADL's : Needs assistance/impaired Eating/Feeding: Independent   Grooming: Wash/dry hands;Wash/dry face;Oral care;Brushing hair;Supervision/safety;Sitting Grooming Details (indicate cue type and reason): increased tim Upper Body Bathing: Supervision/ safety;Sitting Upper Body Bathing Details (indicate cue type and reason): increased time  Lower Body Bathing: Maximal assistance;Sit to/from stand   Upper Body Dressing : Moderate assistance;Sitting Upper Body Dressing Details (indicate cue type and reason): due to impaired balance EOB  Lower Body Dressing: Total assistance;Sit to/from stand Lower Body Dressing Details (indicate cue type and reason): Pt unable to access LEs Toilet Transfer: Moderate assistance;Stand-pivot;Comfort height toilet;RW Toilet Transfer Details (indicate cue type and reason): Pt leaning to Rt.  Requires max verbal cues  Toileting- Clothing Manipulation and Hygiene: Maximal assistance;Sit to/from stand       Functional mobility during ADLs: Moderate assistance;Rolling walker       Vision  Perception     Praxis      Pertinent Vitals/Pain Pain Assessment: 0-10 Pain Score: 3  Faces Pain Scale: Hurts even more Pain Location: back  Pain Descriptors / Indicators: Aching;Constant Pain Intervention(s): Limited activity within patient's  tolerance;Repositioned;Patient requesting pain meds-RN notified     Hand Dominance Right   Extremity/Trunk Assessment Upper Extremity Assessment Upper Extremity Assessment: Generalized weakness   Lower Extremity Assessment Lower Extremity Assessment: Defer to PT evaluation LLE Deficits / Details: Reports numbness/tingling LLE. LLE Sensation: decreased light touch       Communication Communication Communication: No difficulties   Cognition Arousal/Alertness: Awake/alert Behavior During Therapy: Flat affect Overall Cognitive Status: Impaired/Different from baseline Area of Impairment: Problem solving;Safety/judgement;Attention   Current Attention Level: Sustained (with cues )         Problem Solving: Slow processing;Decreased initiation;Difficulty sequencing;Requires verbal cues;Requires tactile cues General Comments: Pt moves very slowly.  Requires mod verbal cues to initiate activity.  He requires mod verbal cues for problem solving and sequencing.  He is slow to respond to questions and often does not complete his sentences - unsure if this is due to medications   General Comments       Exercises       Shoulder Instructions      Home Living Family/patient expects to be discharged to:: Private residence Living Arrangements: Spouse/significant other Available Help at Discharge: Family;Available 24 hours/day Type of Home: House Home Access: Stairs to enter [employer] of Steps: 4 Entrance Stairs-Rails: Right;Left;Can reach both Home Layout: One level     Bathroom Shower/Tub: Walk-in shower;Door   Bathroom Toilet: Handicapped height     Home Equipment: Environmental consultant - 2 wheels;Cane - single point;Bedside commode;Shower seat;Grab bars - toilet;Grab bars - tub/shower;Hand held shower head;Adaptive equipment Adaptive Equipment: Reacher;Long-handled shoe horn        Prior Functioning/Environment Level of Independence: Needs assistance  Gait / Transfers  Assistance Needed: Pt reports he ambulated mod I ADL's / Homemaking Assistance Needed: Pt reports he was able to bathe and dress without assistance, but his son had to empty his "pee bucket" and get him food and drinks PTA.  Prior to December, pt reports he was working full time    Comments: Pt reports using RW for mobility.     OT Diagnosis: Generalized weakness;Cognitive deficits;Acute pain   OT Problem List: Decreased strength;Decreased activity tolerance;Impaired balance (sitting and/or standing);Decreased cognition;Decreased safety awareness;Decreased knowledge of use of DME or AE;Decreased knowledge of precautions;Obesity;Pain   OT Treatment/Interventions: Self-care/ADL training;DME and/or AE instruction;Therapeutic activities;Cognitive remediation/compensation;Patient/family education;Balance training    OT Goals(Current goals can be found in the care plan section) Acute Rehab OT Goals Patient Stated Goal: did not state  OT Goal Formulation: With patient Time For Goal Achievement: 02/20/15 Potential to Achieve Goals: Good ADL Goals Pt Will Perform Grooming: with min assist;standing Pt Will Perform Upper Body Bathing: with supervision;sitting Pt Will Perform Lower Body Bathing: with min assist;with adaptive equipment;sit to/from stand Pt Will Perform Upper Body Dressing: with supervision;sitting Pt Will Perform Lower Body Dressing: with min assist;sit to/from stand;with adaptive equipment Pt Will Transfer to Toilet: with min assist;ambulating;regular height toilet;bedside commode;grab bars Pt Will Perform Toileting - Clothing Manipulation and hygiene: with min assist;sit to/from stand;with adaptive equipment  OT Frequency: Min 2X/week   Barriers to D/C: Decreased caregiver support          Co-evaluation              End of Session Equipment Utilized During Treatment:  Rolling walker Nurse Communication: Mobility status  Activity Tolerance: Patient limited by pain;Other  (comment) (cognitive impairment ) Patient left: in bed;with call bell/phone within reach   Time: 1188-6773 OT Time Calculation (min): 50 min Charges:  OT General Charges $OT Visit: 1 Procedure OT Evaluation $Initial OT Evaluation Tier I: 1 Procedure OT Treatments $Self Care/Home Management : 23-37 mins G-Codes:    Deaundra Kutzer M 2015/03/06, 3:46 PM

## 2015-02-13 NOTE — Clinical Social Work Note (Signed)
CSW Consult Acknowledged:   CSW received a consult for SNF placement. CSW awaiting PT/OT evaluation to determine the appropriate level of care.      Taffany Heiser, MSW, LCSWA [phone]  

## 2015-02-13 NOTE — Progress Notes (Signed)
Pt discharge orders cancelled per on-call orthopedics MD.  Will continue to monitor. Cori Razor, RN

## 2015-02-13 NOTE — Progress Notes (Addendum)
Patient ID: Eddie Carter, male   DOB: 1941-10-31, 74 y.o.   MRN: [id number] No HA, legs NV intact. HOB may be elevated and progressive ambulation     Subjective: 1 Day Post-Op Procedure(s) (LRB): RE-DO LEFT L3-4 MICRODISCECTOMY (N/A) PT and nursing note that patient is too painful to walk in hallway today, Has local anesthesia with exparel. Recommendations is for SNF. Social service consult to be obtained. Cancel discharge for today. Patient reports pain as marked.    Objective:   VITALS:  Temp:  [98.4 F (36.9 C)-99.7 F (37.6 C)] 98.4 F (36.9 C) (03/23 1714) Pulse Rate:  [66-78] 66 (03/23 1714) Resp:  [20] 20 (03/23 1714) BP: (90-138)/(32-62) 138/62 mmHg (03/23 1714) SpO2:  [95 %-98 %] 95 % (03/23 1714)  Neurologically intact ABD soft Neurovascular intact Sensation intact distally Intact pulses distally Dorsiflexion/Plantar flexion intact Incision: no drainage No cellulitis present   LABS No results for input(s): HGB, WBC, PLT in the last 72 hours. No results for input(s): NA, K, CL, CO2, BUN, CREATININE, GLUCOSE in the last 72 hours. No results for input(s): LABPT, INR in the last 72 hours.   Assessment/Plan: 1 Day Post-Op Procedure(s) (LRB): RE-DO LEFT L3-4 MICRODISCECTOMY (N/A)  Advance diet Up with therapy D/C IV fluids Discharge to SNF  Ailanie Ruttan E 02/13/2015, 10:40 PM

## 2015-02-13 NOTE — Evaluation (Signed)
Physical Therapy Evaluation Patient Details Name: Eddie Carter MRN: [id number] DOB: 1941-02-18 Today's Date: 02/13/2015   History of Present Illness  Patient is a 74 y/o male s/p redo L3-4 microdiscectomy. PMH of COPD, asthma, arthritis, GID, CVA, HA, HTN and DM.  Clinical Impression  Patient presents with pain, generalized weakness, balance deficits and cognitive deficits impacting mobility. Increased time to perform all tasks and mobility. Education provided on back precautions. Pt not safe to return home at this time due to increased assist for transfers and mobility. Pt would benefit from skilled PT to improve transfers, gait, balance and mobility so pt can maximize independence and minimize fall risk prior to return home.    Follow Up Recommendations SNF;Supervision/Assistance - 24 hour    Equipment Recommendations  None recommended by PT    Recommendations for Other Services OT consult     Precautions / Restrictions Precautions Precautions: Fall;Back Precaution Booklet Issued: Yes (comment) Precaution Comments: Reviewed handout and precautions. Restrictions Weight Bearing Restrictions: No      Mobility  Bed Mobility Overal bed mobility: Needs Assistance Bed Mobility: Rolling;Sidelying to Sit Rolling: Mod assist Sidelying to sit: Max assist;HOB elevated       General bed mobility comments: Use of rails for support. Cues for log roll technique. Increased time to perform transfer with max cues.  Transfers Overall transfer level: Needs assistance Equipment used: Rolling walker (2 wheeled) Transfers: Sit to/from Stand Sit to Stand: Min assist;From elevated surface         General transfer comment: Min A to rise from EOB with cues for hand placement. Increased time to get pt to stand with coaxing.  Ambulation/Gait Ambulation/Gait assistance: Min assist Ambulation Distance (Feet): 6 Feet Assistive device: Rolling walker (2 wheeled) Gait Pattern/deviations:  Step-to pattern;Decreased stride length;Trunk flexed   Gait velocity interpretation: Below normal speed for age/gender General Gait Details: Pt with slow, unsteady gait. Cues for upright posture. Shuffling steps.  Stairs            Wheelchair Mobility    Modified Rankin (Stroke Patients Only)       Balance Overall balance assessment: Needs assistance;History of Falls Sitting-balance support: Feet supported;No upper extremity supported Sitting balance-Leahy Scale: Poor Sitting balance - Comments: Varies from Max A-Min guard assist sitting EOB. Attempting to urinate for ~6 minutes sitting EOB - LOB posteriorly on numerous occasions requiring Max A to remain upright.   Standing balance support: During functional activity Standing balance-Leahy Scale: Poor Standing balance comment: Reilent on RW.                             Pertinent Vitals/Pain Pain Assessment: Faces Faces Pain Scale: Hurts even more Pain Location: back Pain Descriptors / Indicators: Sore;Aching Pain Intervention(s): Limited activity within patient's tolerance;Monitored during session;Premedicated before session;Repositioned    Home Living Family/patient expects to be discharged to:: Private residence Living Arrangements: Spouse/significant other Available Help at Discharge: Family;Available 24 hours/day Type of Home: House Home Access: Stairs to enter Entrance Stairs-Rails: Right;Left;Can reach both Entrance Stairs-Number of Steps: 4 Home Layout: One level Home Equipment: Walker - 2 wheels;Cane - single point;Bedside commode;Shower seat;Grab bars - toilet;Grab bars - tub/shower;Hand held shower head      Prior Function Level of Independence: Independent with assistive device(s)         Comments: Pt reports using RW for mobility.      Hand Dominance   Dominant Hand: Right    Extremity/Trunk  Assessment   Upper Extremity Assessment: Defer to OT evaluation           Lower  Extremity Assessment: Generalized weakness;LLE deficits/detail   LLE Deficits / Details: Reports numbness/tingling LLE.     Communication   Communication: Other (comment) (Delayed response time to questions. Will come back to question after discussing something else and try to answer but not be able to state appropriate words (word finding difficulties).)  Cognition Arousal/Alertness: Awake/alert Behavior During Therapy: WFL for tasks assessed/performed   Area of Impairment: Problem solving;Safety/judgement;Attention   Current Attention Level: Sustained         Problem Solving: Slow processing;Decreased initiation;Difficulty sequencing;Requires verbal cues;Requires tactile cues General Comments: Delayed processing time to respond to questions, if at all. Repeating a lot during session stating name of wife and therapist.    General Comments      Exercises        Assessment/Plan    PT Assessment Patient needs continued PT services  PT Diagnosis Difficulty walking;Acute pain;Generalized weakness   PT Problem List Decreased strength;Pain;Impaired sensation;Decreased balance;Decreased mobility;Decreased activity tolerance;Decreased knowledge of precautions  PT Treatment Interventions Balance training;Patient/family education;Functional mobility training;Therapeutic activities;Therapeutic exercise;Stair training;Gait training   PT Goals (Current goals can be found in the Care Plan section) Acute Rehab PT Goals Patient Stated Goal: none stated PT Goal Formulation: With patient Time For Goal Achievement: 02/27/15 Potential to Achieve Goals: Fair    Frequency Min 5X/week   Barriers to discharge        Co-evaluation               End of Session Equipment Utilized During Treatment: Gait belt Activity Tolerance: Patient limited by fatigue;Patient limited by pain Patient left: in chair;with call bell/phone within reach Nurse Communication: Mobility status     Functional Assessment Tool Used: Clinical judgment Functional Limitation: Mobility: Walking and moving around Mobility: Walking and Moving Around Current Status [phone]): At least 60 percent but less than 80 percent impaired, limited or restricted Mobility: Walking and Moving Around Goal Status [phone]): At least 20 percent but less than 40 percent impaired, limited or restricted    Time: 2458-0998 PT Time Calculation (min) (ACUTE ONLY): 41 min   Charges:   PT Evaluation $Initial PT Evaluation Tier I: 1 Procedure PT Treatments $Therapeutic Exercise: 8-22 mins $Therapeutic Activity: 8-22 mins   PT G Codes:   PT G-Codes **NOT FOR INPATIENT CLASS** Functional Assessment Tool Used: Clinical judgment Functional Limitation: Mobility: Walking and moving around Mobility: Walking and Moving Around Current Status ([id number]): At least 60 percent but less than 80 percent impaired, limited or restricted Mobility: Walking and Moving Around Goal Status [phone]): At least 20 percent but less than 40 percent impaired, limited or restricted    Candy Sledge A 02/13/2015, 12:37 PM Candy Sledge, [place], DPT [phone]

## 2015-02-14 DIAGNOSIS — M5116 Intervertebral disc disorders with radiculopathy, lumbar region: Secondary | ICD-10-CM | POA: Diagnosis not present

## 2015-02-14 MED ORDER — FLEET ENEMA 7-19 GM/118ML RE ENEM
1.0000 | ENEMA | Freq: Once | RECTAL | Status: DC
  Filled 2015-02-14: qty 1

## 2015-02-14 MED ORDER — MAGNESIUM HYDROXIDE 400 MG/5ML PO SUSP
30.0000 mL | Freq: Every day | ORAL | Status: DC
  Administered 2015-02-14: 30 mL via ORAL
  Filled 2015-02-14: qty 30

## 2015-02-14 MED ORDER — POLYETHYLENE GLYCOL 3350 17 G PO PACK: 17.0000 g | PACK | Freq: Every day | ORAL | Status: DC

## 2015-02-14 MED ORDER — METHOCARBAMOL 500 MG PO TABS: 500.0000 mg | ORAL_TABLET | Freq: Four times a day (QID) | ORAL | Status: DC | PRN

## 2015-02-14 MED ORDER — OXYCODONE-ACETAMINOPHEN 5-325 MG PO TABS: 1.0000 | ORAL_TABLET | ORAL | Status: DC | PRN

## 2015-02-14 MED ORDER — DOCUSATE SODIUM 100 MG PO CAPS: 100.0000 mg | ORAL_CAPSULE | Freq: Two times a day (BID) | ORAL | Status: DC

## 2015-02-14 MED ORDER — HYDROXYZINE HCL 10 MG PO TABS
20.0000 mg | ORAL_TABLET | Freq: Four times a day (QID) | ORAL | Status: DC | PRN
  Filled 2015-02-14: qty 2

## 2015-02-14 NOTE — Progress Notes (Signed)
Pt ambulated to bathroom and back to bed.  Will continue to monitor. Cori Razor, RN

## 2015-02-14 NOTE — Clinical Social Work Note (Signed)
Clinical Social Worker has assessed patient and pt's family. Full psychosocial assessment to follow.   Patient has a bed at [facility] and [place]. CSW awaiting MD to sign FL-2.   Glendon Axe, MSW, LCSWA [phone] 02/14/2015 1:55 PM

## 2015-02-14 NOTE — Progress Notes (Signed)
Occupational Therapy Treatment Patient Details Name: Eddie Carter MRN: [id number] DOB: 1941-01-23 Today's Date: 02/14/2015    History of present illness Patient is a 74 y/o male s/p redo L3-4 microdiscectomy. PMH of COPD, asthma, arthritis, GID, CVA, HA, HTN and DM.   OT comments  Pt still with some delay in processing, but significantly improved today. He is able to perform functional transfers with min guard assist to min A. Continue to recommend SNF for rehab prior to return home.   Follow Up Recommendations  SNF    Equipment Recommendations  None recommended by OT    Recommendations for Other Services      Precautions / Restrictions Precautions Precautions: Fall;Back Precaution Comments: Pt requires min verbal cues for back precautions        Mobility Bed Mobility Overal bed mobility: Needs Assistance Bed Mobility: Rolling;Sidelying to Sit Rolling: Min guard Sidelying to sit: Min assist       General bed mobility comments: cues for sequencing and technique and assist to lift shoulders from bed   Transfers Overall transfer level: Needs assistance Equipment used: Rolling walker (2 wheeled) Transfers: Stand Pivot Transfers;Sit to/from Stand Sit to Stand: Min guard Stand pivot transfers: Min guard       General transfer comment: Pt requires increased time to initiate and complete tasks     Balance Overall balance assessment: Needs assistance Sitting-balance support: Feet supported Sitting balance-Leahy Scale: Poor Sitting balance - Comments: Pt with posterior lean, min A to correct  Postural control: Posterior lean Standing balance support: Bilateral upper extremity supported Standing balance-Leahy Scale: Poor                     ADL       Grooming: Wash/dry hands;Min guard;Standing                   Toilet Transfer: Minimal assistance;Ambulation;Comfort height toilet;RW;Grab bars   Toileting- Clothing Manipulation and Hygiene: Minimal  assistance;Sit to/from stand                Vision                     Perception     Praxis      Cognition   Behavior During Therapy: [facility] for tasks assessed/performed Overall Cognitive Status: Impaired/Different from baseline                Problem Solving: Slow processing General Comments: Pt significantly improved today     Extremity/Trunk Assessment               Exercises     Shoulder Instructions       General Comments      Pertinent Vitals/ Pain       Pain Assessment: 0-10 Pain Score: 3  Pain Location: back Pain Descriptors / Indicators: Aching Pain Intervention(s): Monitored during session  Home Living                                          Prior Functioning/Environment              Frequency Min 2X/week     Progress Toward Goals  OT Goals(current goals can now be found in the care plan section)  Progress towards OT goals: Progressing toward goals  ADL Goals Pt Will Perform Grooming: with min assist;standing Pt Will  Perform Upper Body Bathing: with supervision;sitting Pt Will Perform Lower Body Bathing: with min assist;with adaptive equipment;sit to/from stand Pt Will Perform Upper Body Dressing: with supervision;sitting Pt Will Perform Lower Body Dressing: with min assist;sit to/from stand;with adaptive equipment Pt Will Transfer to Toilet: with min assist;ambulating;regular height toilet;bedside commode;grab bars Pt Will Perform Toileting - Clothing Manipulation and hygiene: with min assist;sit to/from stand;with adaptive equipment  Plan Discharge plan remains appropriate    Co-evaluation                 End of Session Equipment Utilized During Treatment: Rolling walker   Activity Tolerance Patient tolerated treatment well   Patient Left in chair;with call bell/phone within reach   Nurse Communication Mobility status        Time: 2035-5974 OT Time Calculation (min): 23  min  Charges: OT General Charges $OT Visit: 1 Procedure OT Treatments $Self Care/Home Management : 8-22 mins $Therapeutic Activity: 8-22 mins  Astraea Gaughran M 02/14/2015, 2:40 PM

## 2015-02-14 NOTE — Clinical Social Work Placement (Signed)
Clinical Social Work Department CLINICAL SOCIAL WORK PLACEMENT NOTE 02/14/2015  Patient:  Eddie Carter, Eddie Carter  Account Number:  [id number] Admit date:  02/12/2015  Clinical Social Worker:  Glendon Axe, CLINICAL SOCIAL WORKER  Date/time:  02/14/2015 11:23 PM  Clinical Social Work is seeking post-discharge placement for this patient at the following level of care:   SKILLED NURSING   (*CSW will update this form in Epic as items are completed)   02/14/2015  Patient/family provided with [place] Department of Clinical Social Work's list of facilities offering this level of care within the geographic area requested by the patient (or if unable, by the patient's family).  02/14/2015  Patient/family informed of their freedom to choose among providers that offer the needed level of care, that participate in Medicare, Medicaid or managed care program needed by the patient, have an available bed and are willing to accept the patient.  02/14/2015  Patient/family informed of [facility]' ownership interest in [facility], as well as of the fact that they are under no obligation to receive care at this facility.  PASARR submitted to EDS on EXISTING  PASARR number received on EXISTING  FL2 transmitted to all facilities in geographic area requested by pt/family on  02/14/2015 FL2 transmitted to all facilities within larger geographic area on   Patient informed that his/her managed care company has contracts with or will negotiate with  certain facilities, including the following:   YES     Patient/family informed of bed offers received:  02/14/2015 Patient chooses bed at [place] Physician recommends and patient chooses bed at    Patient to be transferred to [place] on  02/14/2015 Patient to be transferred to facility by [facility] Patient and family notified of transfer on 02/14/2015 Name of family member notified:  Pt's wife, Colston Pyle at bedside.  The following  physician request were entered in Epic:   Additional Comments:  Glendon Axe, MSW, [place] [phone] 02/14/2015

## 2015-02-14 NOTE — Clinical Social Work Psychosocial (Signed)
Clinical Social Work Department BRIEF PSYCHOSOCIAL ASSESSMENT 02/14/2015  Patient:  Eddie Carter, Eddie Carter     Account Number:  [id number]     Admit date:  02/12/2015  Clinical Social Worker:  Glendon Axe, CLINICAL SOCIAL WORKER  Date/Time:  02/14/2015 11:08 PM  Referred by:  Physician  Date Referred:  02/14/2015 Referred for  SNF Placement   Other Referral:   Interview type:  Other - See comment Other interview type:   CSW met with patient and pt's wife present at bedside.    PSYCHOSOCIAL DATA Living Status:  WIFE Admitted from facility:  n/a Level of care:  n/a Primary support name:  Eddie Carter Primary support relationship to patient:  SPOUSE Degree of support available:   Strong    CURRENT CONCERNS Current Concerns  Post-Acute Placement   Other Concerns:    SOCIAL WORK ASSESSMENT / PLAN Clinical Social Worker met with patient and pt's wife in reference to post-acute placement for SNF. CSW introduced CSW role and SNF process. CSW reviewed and provided SNF list. Pt reported he has been a resident of [facility] back in December 2015 for short-term rehab and is agreeable to return if a bed is available. Pt reported he has been experiencing shortness of breath and does not take care of himself as he should. Pt further reproted his wife is very supportive and takes good care of him. CSW faxed pt's clincials and presented bed offers. Patient chooses private room at [facility] and [place]. CSW will continue to follow pt and pt's family for continued support and to facilitate pt's discharge needs for today.   Assessment/plan status:  Psychosocial Support/Ongoing Assessment of Needs Other assessment/ plan:   CSW faxed FL-2 to MD's office for signature.   Information/referral to community resources:   SNF list provided.    PATIENT'S/FAMILY'S RESPONSE TO PLAN OF CARE: Pt lying in bed alert and oriented. Pt and pt's wife agreeable to SNF placement.  Pt and pt's wife  pleasant and appreciated social work intevention.       Glendon Axe, MSW, [place] [phone] 02/14/2015

## 2015-02-14 NOTE — Clinical Social Work Note (Signed)
Clinical Social Worker facilitated patient discharge including contacting patient family and facility to confirm patient discharge plans.  Clinical information faxed to facility and family agreeable with plan.  CSW arranged ambulance transport via [facility] to [facility].  RN to call report prior to discharge.  DC packet on chart for transport (Note: currently awaiting FL-2 signature for MD).  Clinical Social Worker will sign off for now as social work intervention is no longer needed. Please consult [place] again if new need arises.  Glendon Axe, MSW, LCSWA [phone] 02/14/2015 2:51 PM

## 2015-02-14 NOTE — Progress Notes (Signed)
Pt refused to ambulate in the hall this morning.  Will continue to encourage walking. Cori Razor, RN

## 2015-02-14 NOTE — Progress Notes (Signed)
Discharge orders received.  VSS upon discharge.  IV removed and patient dressed.  Transported out via stretcher.  Report called to [facility] ([place]). Cori Razor, RN

## 2015-02-14 NOTE — Progress Notes (Signed)
Physical Therapy Treatment Patient Details Name: Eddie Carter MRN: [id number] DOB: 08-Feb-1941 Today's Date: 02/14/2015    History of Present Illness Patient is a 74 y/o male s/p redo L3-4 microdiscectomy. PMH of COPD, asthma, arthritis, GID, CVA, HA, HTN and DM.    PT Comments    Patient limited by pain and refused to ambulate with PT. Patient does not take cueing and direction well and becomes easily frustrated with corrections. Reviewed precautions at end of session. Continue to recommend SNF for ongoing Physical Therapy.     Follow Up Recommendations  SNF;Supervision/Assistance - 24 hour     Equipment Recommendations  None recommended by PT    Recommendations for Other Services       Precautions / Restrictions Precautions Precautions: Fall;Back Precaution Comments: Patient able to recall precautions this session with min cues    Mobility  Bed Mobility Overal bed mobility: Needs Assistance Bed Mobility: Sit to Sidelying Rolling: Min assist Sidelying to sit: Min assist       General bed mobility comments: Min A to power up into full sitting. Cues for log roll technique to maintain precautions  Transfers Overall transfer level: Needs assistance Equipment used: Rolling walker (2 wheeled)   Sit to Stand: Min guard         General transfer comment: Pt very slow to move.  MinGuard for safety. Patient tends to pull up from RW  Ambulation/Gait Ambulation/Gait assistance: Min guard Ambulation Distance (Feet): 6 Feet Assistive device: Rolling walker (2 wheeled) Gait Pattern/deviations: Step-through pattern;Decreased stride length     General Gait Details: patient with flexed trunk. Agreeable to ambulate to recliner but refused further ambulation   Stairs            Wheelchair Mobility    Modified Rankin (Stroke Patients Only)       Balance                                    Cognition Arousal/Alertness: Awake/alert Behavior During  Therapy: Flat affect                 Problem Solving: Slow processing General Comments: Patient very "set in ways' and will not attempt more than he wants to with activity. Slow to respond to questions and comments.     Exercises      General Comments        Pertinent Vitals/Pain Pain Score: 7  Pain Location: back Pain Descriptors / Indicators: Sore Pain Intervention(s): Monitored during session;Repositioned    Home Living                      Prior Function            PT Goals (current goals can now be found in the care plan section) Progress towards PT goals: Progressing toward goals    Frequency  Min 5X/week    PT Plan Current plan remains appropriate    Co-evaluation             End of Session   Activity Tolerance: Patient limited by pain Patient left: in chair;with call bell/phone within reach     Time: 0926-0943 PT Time Calculation (min) (ACUTE ONLY): 17 min  Charges:  $Therapeutic Activity: 8-22 mins                    G Codes:  Jacqualyn Posey 02/14/2015, 10:02 AM 02/14/2015 Jacqualyn Posey PTA [phone] pager [phone] office

## 2015-02-14 NOTE — Discharge Summary (Signed)
Physician Discharge Summary      Patient ID: Eddie Carter MRN: [id number] DOB/AGE: [birth year] 73 y.o.  Admit date: 02/12/2015 Discharge date:   Admission Diagnoses:  Active Problems:   HNP (herniated nucleus pulposus), lumbar   Discharge Diagnoses:  Same  Past Medical History  Diagnosis Date  . COPD (chronic obstructive pulmonary disease)   . Shortness of breath   . Asthma   . Arthritis   . GERD (gastroesophageal reflux disease)     no meds  . History of GI bleed   . HOH (hard of hearing)   . Full dentures   . Traumatic tear of lateral meniscus of right knee     right knee  . Medial meniscus, posterior horn derangement     left knee  . Diabetes mellitus without complication   . Headache     difficulty with headaches- 1950's , again in 1967, 2008- again problems with migrraines   . Cancer     face- basal cell  . Hypertension   . Complication of anesthesia     after septoplasty and [facility]lectomy-[facility]  . Stroke 1995    some speech and thought processes slow  . Sleep apnea     uses a cpap, most nights   . Pneumonia 2014    [facility]    Surgeries: Procedure(s): RE-DO LEFT L3-4 MICRODISCECTOMY on 02/12/2015   Consultants:    Discharged Condition: Improved  Hospital Course: ONIE HAYASHI is an 74 y.o. male who was admitted 02/12/2015 with a chief complaint of No chief complaint on file. , and found to have a diagnosis of <principal problem not specified>.  He was brought to the operating room on 02/12/2015 and underwent the above named procedures.    They were given perioperative antibiotics:  Anti-infectives    Start     Dose/Rate Route Frequency Ordered Stop   02/12/15 2015  ceFAZolin (ANCEF) IVPB 1 g/50 mL premix     1 g 100 mL/hr over 30 Minutes Intravenous Every 8 hours 02/12/15 2010 02/13/15 0407   02/12/15 0600  ceFAZolin (ANCEF) 3 g in dextrose 5 % 50 mL IVPB     3 g 160 mL/hr over 30 Minutes Intravenous On call to  O.R. 02/11/15 1431 02/12/15 1340    surgery was complicated by a dural leak which was repaired with interrupted sutures and DuraSeal. Postoperatively he complained of headache in the PACU to that he was placed flat position. He then was transferred to 4 N. MedSurg floor where he was To the head of bed flat logrolling. He demonstrated capacity to void though with some difficulty as he was lying flat. On postoperative day #1 he had no headaches of that the bed was brought up and the patient was begun on progressive ambulation program slowly. Physical therapy and occupational therapy noted that Mr. Michels was slow in his response and slow in his cognitive ability. He complained of pain during his attempts at ambulation first. Skilled nursing facility placement for short-term rehabilitation program was recommended. Social service consult was obtained. Postoperative day #2 incision was dry there is no sign of fluctuance. Mr. Yi alert and oriented 4 he demonstrated intact motor in all major motor groups in both lower extremities with improvement in left quadriceps function to 5-5 strength. His IV was discontinued and skilled nursing facility placement was undertaken. 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 When his skilled nursing facility is determined he will be discharged to the skilled nursing facility with her chronic medicines written. Plan to see him back in our office at 2 weeks from the time of surgery.  He was given sequential compression devices, early ambulation for DVT prophylaxis.  He benefited maximally from his hospital stay and there were no complications.    Recent vital signs:  Filed Vitals:   02/14/15 0611  BP: 121/57  Pulse: 67  Temp: 98.6 F (37 C)  Resp: 18    Recent laboratory studies:  Results for orders placed or  performed during the hospital encounter of 02/12/15  No blood products  Result Value Ref Range   Transfuse no blood products      TRANSFUSE NO BLOOD PRODUCTS, VERIFIED BY BELLIOTT RN    Discharge Medications:     Medication List    TAKE these medications        acetaminophen 500 MG tablet  Commonly known as:  TYLENOL  Take by mouth every 6 (six) hours as needed.     budesonide-formoterol 160-4.5 MCG/ACT inhaler  Commonly known as:  SYMBICORT  Inhale 2 puffs into the lungs 2 (two) times daily.     fluticasone 50 MCG/ACT nasal spray  Commonly known as:  FLONASE  Place 2 sprays into both nostrils daily.     hydrOXYzine 10 MG tablet  Commonly known as:  ATARAX/VISTARIL  Take 2 tablets by mouth at bedtime.     methocarbamol 500 MG tablet  Commonly known as:  ROBAXIN  Take 1 tablet (500 mg total) by mouth every 6 (six) hours as needed for muscle spasms.     solifenacin 10 MG tablet  Commonly known as:  VESICARE  Take 10 mg by mouth daily as needed.     tiotropium 18 MCG inhalation capsule  Commonly known as:  SPIRIVA  Place 1 capsule (18 mcg total) into inhaler and inhale daily.     Vitamin D 1000 UNITS capsule  Take 3,000 Units by mouth daily.        Diagnostic Studies: Dg Lumbar Spine 2-3 Views  02/12/2015   CLINICAL DATA:  L3-4 microdiskectomy  EXAM: LUMBAR SPINE - 2-3 VIEW  COMPARISON:  01/14/2015  FINDINGS: Lateral intraoperative view of the lumbar spine was obtained and reveals a needle within the posterior soft tissues just below the L3-4 interspace. A subsequent film was obtained and shows surgical instruments posterior to the L3 vertebral body.  IMPRESSION: Intraoperative localization at L3-4   Electronically Signed   By: Inez Catalina M.D.   On: 02/12/2015 14:56    Disposition: 01-Home or Self Care      Discharge Instructions    Call MD / Call 911    Complete by:  As directed   If you experience chest pain or shortness of breath, CALL 911 and be transported  to the hospital emergency room.  If you develope a fever above 101 F, pus (white drainage) or increased drainage or redness at the wound, or calf pain, call your surgeon's office.     Constipation Prevention    Complete by:  As directed   Drink plenty of fluids.  Prune juice may be helpful.  You may use a stool softener, such as Colace (over the counter) 100 mg twice a day.  Use MiraLax (over the counter) for constipation as needed.     Diet - low  sodium heart healthy    Complete by:  As directed      Discharge instructions    Complete by:  As directed   No lifting greater than 10 lbs. Avoid bending, stooping and twisting. Walk in house for first week them may start to get out slowly increasing distance up to 1/2 mile by 3 weeks post op. Keep incision dry for 3 days, may use tegaderm or similar water impervious dressing.     Driving restrictions    Complete by:  As directed   No driving for 4 weeks     Increase activity slowly as tolerated    Complete by:  As directed      Lifting restrictions    Complete by:  As directed   No lifting for 6 weeks           Follow-up Information    Follow up with Jezabella Schriever E, MD In 2 weeks.   Specialty:  Orthopedic Surgery   Contact information:   [place] [address] [phone]        Signed: Jessy Oto 02/14/2015, 8:12 AM

## 2015-02-14 NOTE — Clinical Social Work Note (Signed)
Signed FL-2 obtained from fax machine.  Clinical Social Worker will sign off for now as social work intervention is no longer needed. Please consult [place] again if new need arises.  Glendon Axe, MSW, LCSWA [phone] 02/14/2015 3:04 PM

## 2015-02-14 NOTE — Progress Notes (Signed)
Patient ID: Eddie Carter, male   DOB: Jul 15, 1941, 74 y.o.   MRN: [id number]     Subjective: 2 Days Post-Op Procedure(s) (LRB): RE-DO LEFT L3-4 MICRODISCECTOMY (N/A) PT and OT note delayed response and slow in movements due to pain and ? Impaired cognitive function. This AM alert and Oriented x 4. C/o persistent numbness left anterior thigh. Weak in both legs. HAs improved. He is sitting up in bed in NAD. Patient reports pain as marked.    Objective:   VITALS:  Temp:  [98.4 F (36.9 C)-99.7 F (37.6 C)] 98.6 F (37 C) (03/24 3354) Pulse Rate:  [65-78] 67 (03/24 0611) Resp:  [18-20] 18 (03/24 0611) BP: (104-138)/(48-62) 121/57 mmHg (03/24 0611) SpO2:  [92 %-97 %] 92 % (03/24 5625)  Neurologically intact ABD soft Neurovascular intact Intact pulses distally Dorsiflexion/Plantar flexion intact Incision: no drainage No fluctuance. All major motor are intact, left quad is improved 5-/5.   LABS No results for input(s): HGB, WBC, PLT in the last 72 hours. No results for input(s): NA, K, CL, CO2, BUN, CREATININE, GLUCOSE in the last 72 hours. No results for input(s): LABPT, INR in the last 72 hours.   Assessment/Plan: 2 Days Post-Op Procedure(s) (LRB): RE-DO LEFT L3-4 MICRODISCECTOMY (N/A)  Advance diet Up with therapy D/C IV fluids Discharge to SNF  NITKA,JAMES E 02/14/2015, 8:04 AM

## 2015-02-16 NOTE — Progress Notes (Signed)
Occupational Therapy Evaluation Addendum:    03-15-15 1500  OT G-codes **NOT FOR INPATIENT CLASS**  Functional Limitation Self care  Self Care Current Status (X6553) CL  Self Care Goal Status (Z4827) Riley Lam, OTR/L [phone]

## 2015-02-18 ENCOUNTER — Other Ambulatory Visit: Payer: Self-pay

## 2015-02-18 ENCOUNTER — Encounter: Payer: Self-pay | Admitting: Registered Nurse

## 2015-02-18 ENCOUNTER — Non-Acute Institutional Stay (SKILLED_NURSING_FACILITY): Payer: No Typology Code available for payment source | Admitting: Registered Nurse

## 2015-02-18 DIAGNOSIS — E119 Type 2 diabetes mellitus without complications: Secondary | ICD-10-CM

## 2015-02-18 DIAGNOSIS — E559 Vitamin D deficiency, unspecified: Secondary | ICD-10-CM | POA: Diagnosis not present

## 2015-02-18 DIAGNOSIS — M5126 Other intervertebral disc displacement, lumbar region: Secondary | ICD-10-CM | POA: Diagnosis not present

## 2015-02-18 DIAGNOSIS — R51 Headache: Secondary | ICD-10-CM | POA: Diagnosis not present

## 2015-02-18 DIAGNOSIS — J449 Chronic obstructive pulmonary disease, unspecified: Secondary | ICD-10-CM | POA: Diagnosis not present

## 2015-02-18 DIAGNOSIS — N3281 Overactive bladder: Secondary | ICD-10-CM | POA: Diagnosis not present

## 2015-02-18 DIAGNOSIS — R519 Headache, unspecified: Secondary | ICD-10-CM

## 2015-02-18 DIAGNOSIS — K5900 Constipation, unspecified: Secondary | ICD-10-CM | POA: Diagnosis not present

## 2015-02-18 MED ORDER — OXYCODONE-ACETAMINOPHEN 5-325 MG PO TABS: ORAL_TABLET | ORAL | Status: DC

## 2015-02-18 NOTE — Progress Notes (Addendum)
Patient ID: Eddie Carter, male   DOB: Oct 18, 1941, 74 y.o.   MRN: [id number]   Place of Service: [facility]  Allergies  Allergen Reactions  . Asa [Aspirin] Shortness Of Breath  . Nsaids Other (See Comments)    Makes him bleed.    Code Status: DNR  Goals of Care: Comfort and Quality of Life/STR  Chief Complaint  Patient presents with  . Hospitalization Follow-up    HPI 74 y.o. male with PMH of COPD, asthma, HTN, stroke , arthritis, lung nodules, headache among others is being seen for a follow-up visit post hospital admission from 02/12/15 to 02/14/15 for lumbar herniated pulposus s/p redo left L3-4 microdiscectomy on 02/12/15. He is here for short term rehabilitation and his goal is to return home and start working again. Seen in room today. Denies any concerns. Stated that pain is adequately controlled with current regimen. Reported having issue with constipation, only has 1 bm over the past 7 days, last bm 2 days ago. Would like to have milk of magnesia. Denies any other concerns.   Review of Systems Constitutional: Negative for fever and chills HENT: Negative for ear pain, congestion, and sore throat.  Eyes: Negative for eye pain and eye discharge  Cardiovascular: Negative for chest pain, palpitations. Positive for leg sweling Respiratory: Negative cough, shortness of breath, and wheezing.  Gastrointestinal: Negative for nausea and vomiting. Positive for constipation Genitourinary: Negative for  dysuria, frequency, urgency, and hematuria Musculoskeletal: Positive for back pain Neurological: Negative for dizziness. Positive for headache (chronic) Skin: Negative for rash    Psychiatric: Negative for depression  Past Medical History  Diagnosis Date  . COPD (chronic obstructive pulmonary disease)   . Shortness of breath   . Asthma   . Arthritis   . GERD (gastroesophageal reflux disease)     no meds  . History of GI bleed   . HOH (hard of hearing)   . Full dentures    . Traumatic tear of lateral meniscus of right knee     right knee  . Medial meniscus, posterior horn derangement     left knee  . Diabetes mellitus without complication   . Headache     difficulty with headaches- 1950's , again in 1967, 2008- again problems with migrraines   . Cancer     face- basal cell  . Hypertension   . Complication of anesthesia     after septoplasty and uvulectomy-was in icu- [facility] - 10 yrs. ago  . Stroke 1995    some speech and thought processes slow  . Sleep apnea     uses a cpap, most nights   . Pneumonia 2014    [facility]    Past Surgical History  Procedure Laterality Date  . Foot foreign body removal  1976    left foot -multiple pieces glass  . Appendectomy  1963  . Hemorroidectomy  1995  . Carpal tunnel release      left  . Colonoscopy    . Upper gi endoscopy    . Nasal septum surgery  1995  . Uvulopalatopharyngoplasty (uppp)/tonsillectomy/septoplasty  1995    same time with septoplasty-ended up ICU almost trached  . Tonsillectomy    . Knee arthroscopy Bilateral 01/17/2013    Procedure: ARTHROSCOPY KNEE BILATERAL WITH MEDIAL AND LATERAL MENISECTOMIES;  Surgeon: Lorn Junes, MD;  Location: [place];  Service: Orthopedics;  Laterality: Bilateral;  . Eye surgery      foreign body removed  fr. eye, ? side   . Lumbar laminectomy N/A 11/05/2014    Procedure: LEFT L3-4 MICRODISCECTOMY;  Surgeon: Jessy Oto, MD;  Location: [place];  Service: Orthopedics;  Laterality: N/A;  . Lumbar laminectomy/decompression microdiscectomy N/A 02/12/2015    Procedure: RE-DO LEFT L3-4 MICRODISCECTOMY;  Surgeon: Jessy Oto, MD;  Location: [place];  Service: Orthopedics;  Laterality: N/A;    History  Substance Use Topics  . Smoking status: Former Smoker -- 1.50 packs/day for 40 years    Types: Cigarettes    Quit date: 01/14/1996  . Smokeless tobacco: Never Used  . Alcohol Use: Yes     Comment: occ    Family History  Problem  Relation Age of Onset  . Cancer Mother   . Heart attack Father       Medication List       This list is accurate as of: 02/18/15 10:53 PM.  Always use your most recent med list.               acetaminophen 500 MG tablet  Commonly known as:  TYLENOL  Take by mouth every 6 (six) hours as needed.     budesonide-formoterol 160-4.5 MCG/ACT inhaler  Commonly known as:  SYMBICORT  Inhale 2 puffs into the lungs 2 (two) times daily.     fluticasone 50 MCG/ACT nasal spray  Commonly known as:  FLONASE  Place 2 sprays into both nostrils daily.     hydrOXYzine 10 MG tablet  Commonly known as:  ATARAX/VISTARIL  Take 2 tablets by mouth at bedtime.     magnesium hydroxide 400 MG/5ML suspension  Commonly known as:  MILK OF MAGNESIA  Take 30 mLs by mouth daily.     methocarbamol 500 MG tablet  Commonly known as:  ROBAXIN  Take 1 tablet (500 mg total) by mouth every 6 (six) hours as needed for muscle spasms.     oxyCODONE-acetaminophen 5-325 MG per tablet  Commonly known as:  PERCOCET/ROXICET  Take 1 tablet by mouth every four hours for moderate pain *DO NOT EXCEED 4 GM OF TYLENOL IN 24 HOURS*  Take 2 tablets by mouth every 4 hours for severe pain * DO NOT EXCEED 4GM OF TYLENOL IN 24 HOURS*     polyethylene glycol packet  Commonly known as:  MIRALAX / GLYCOLAX  Take 17 g by mouth daily as needed.     solifenacin 10 MG tablet  Commonly known as:  VESICARE  Take 10 mg by mouth daily as needed.     tiotropium 18 MCG inhalation capsule  Commonly known as:  SPIRIVA  Place 1 capsule (18 mcg total) into inhaler and inhale daily.     Vitamin D 1000 UNITS capsule  Take 3,000 Units by mouth daily.        Physical Exam  BP 135/80 mmHg  Pulse 80  Temp(Src) 98 F (36.7 C)  Resp 18  Ht 5\' 10"  (1.778 m)  Wt 307 lb (139.254 kg)  BMI 44.05 kg/m2  Constitutional: Obese elderly male in no acute distress. Conversant and pleasant HEENT: Normocephalic and atraumatic. PERRL. EOM intact.  No icterus. No nasal discharge or sinus tenderness. Oral mucosa moist. Posterior pharynx clear of any exudate or lesions. Teeth and gingiva in good general condition.  Neck: Supple and nontender. No lymphadenopathy, masses, or thyromegaly. No JVD or carotid bruits. Cardiac: Normal S1, S2. RRR without appreciable murmurs, rubs, or gallops. Distal pulses intact. Trace dependent edema of BLE Lungs: No respiratory distress.  Breath sounds clear bilaterally without rales, rhonchi, or wheezes. Abdomen: Audible bowel sounds in all quadrants. Soft, nontender, nondistended.  Musculoskeletal: able to move all extremities w/ generalized weakness. Lower back tender to palpation-surgical incision w/o signs of infection. Skin: Warm and dry. No rash noted.  Neurological: Alert and oriented to person, place, and time. No focal deficits.  Psychiatric: Judgment and insight adequate. Appropriate mood and affect.   Labs Reviewed  CBC Latest Ref Rng 02/07/2015 11/02/2014 01/17/2013  WBC 4.0 - 10.5 K/uL 9.0 11.0(H) -  Hemoglobin 13.0 - 17.0 g/dL 14.4 15.9 15.0  Hematocrit 39.0 - 52.0 % 43.1 46.4 44.0  Platelets 150 - 400 K/uL 180 179 -    CMP Latest Ref Rng 02/07/2015 11/02/2014 01/17/2013  Glucose 70 - 99 mg/dL 127(H) 122(H) 119(H)  BUN 6 - 23 mg/dL 9 17 13   Creatinine 0.50 - 1.35 mg/dL 1.06 0.88 1.00  Sodium 135 - 145 mmol/L 137 137 140  Potassium 3.5 - 5.1 mmol/L 4.1 4.6 3.8  Chloride 96 - 112 mmol/L 104 99 107  CO2 19 - 32 mmol/L 21 25 -  Calcium 8.4 - 10.5 mg/dL 9.2 9.5 -  Total Protein 6.0 - 8.3 g/dL 6.6 - -  Total Bilirubin 0.3 - 1.2 mg/dL 0.8 - -  Alkaline Phos 39 - 117 U/L 55 - -  AST 0 - 37 U/L 35 - -  ALT 0 - 53 U/L 30 - -    Lab Results  Component Value Date   HGBA1C 6.9* 11/05/2014    Diagnostic Studies Reviewed 01/14/15: MRI Lumbar spine:  1. L3-L4 rightward and cephalad disc extrusion with suspected sequestered fragment 10 x 17 x 18 mm. Severe left lateral recess and left L3 foraminal  stenosis. 2. Postoperative changes on the left at L3-L4 since 10/29/2014, and otherwise stable lumbar spine.  Assessment & Plan 1. HNP (herniated nucleus pulposus), lumbar S/p redo left L3-4 microdiscectomy. Pain is adequately controlled with current regimen. Continue tylenol 500mg  of tylenol every six hours as needed for mild pain and percocet 5/325mg  1-2 tabs every four hours as needed for moderate to severe pain. Continue robaxin 500mg  every six hours as needed for muscle spasms. Continue to work with PT/OT for gait/strength/balance training to restore/maximize function and continue f/u with ortho  2. Constipation, unspecified constipation type Start milk of magnesia daily. Continue miralax daily as needed for constipation. Encourage hydration and ambulation as tolerated.   3. Chronic obstructive pulmonary disease, unspecified COPD, unspecified chronic bronchitis type Stable. Continue spiriva 18cmg puff daily and symbicort 160/4.5mg  2 puffs twice daily. Continue to monitor   4. Vitamin D deficiency Continue vit D 3000 iu daily  5. OAB (overactive bladder) Continue vesicare 10mg  daily as needed   6. Chronic nonintractable headache, unspecified headache type Ongoing. Continue tylenol 500mg  of tylenol every six hours as needed for mild pain and percocet 5/325mg  1-2 tabs every four hours as needed for moderate to severe pain. Continue to monitor.   7. Type 2 diabetes mellitus without complication Last U9W 6.9. Currently not on med. Recommended lifestyle modifications as he is not interested in medications.   Time spent: 40 minutes on care coordination and counseling   Family/Staff Communication Plan of care discussed with patient and nursing staff. Patient and nursing staff verbalized understanding and agree with plan of care. No additional questions or concerns reported.    Arthur Holms, MSN, AGNP-C [facility] [address] [place], [place] [address] [phone]  [8am-5pm] After hours: [phone]

## 2015-02-18 NOTE — Telephone Encounter (Signed)
[facility] 

## 2015-02-22 ENCOUNTER — Non-Acute Institutional Stay (SKILLED_NURSING_FACILITY): Payer: No Typology Code available for payment source | Admitting: Internal Medicine

## 2015-02-22 DIAGNOSIS — K5901 Slow transit constipation: Secondary | ICD-10-CM

## 2015-02-22 DIAGNOSIS — E119 Type 2 diabetes mellitus without complications: Secondary | ICD-10-CM | POA: Diagnosis not present

## 2015-02-22 DIAGNOSIS — J449 Chronic obstructive pulmonary disease, unspecified: Secondary | ICD-10-CM

## 2015-02-22 DIAGNOSIS — M5126 Other intervertebral disc displacement, lumbar region: Secondary | ICD-10-CM

## 2015-02-22 DIAGNOSIS — N3281 Overactive bladder: Secondary | ICD-10-CM

## 2015-02-22 NOTE — Progress Notes (Signed)
Patient ID: Eddie Carter, male   DOB: 08-14-1941, 74 y.o.   MRN: [id number]      Facility: [facility]   PCP: [name], MD  Code status: dnr  Allergies  Allergen Reactions  . Asa [Aspirin] Shortness Of Breath  . Nsaids Other (See Comments)    Makes him bleed.    Chief Complaint  Patient presents with  . New Admit To SNF     HPI:  74 year old patient is here for short term rehabilitation post hospital admission from 02/12/15 to 02/14/15 with lumbar herniated pulposus and underwent redo left L3-4 microdiscectomy on 02/12/15. The surgery was complicated by a dural leak which was repaired with interrupted sutures and DuraSeal. Postoperatively he had headache and was made to lie down flat which helped.  He has PMH of COPD, asthma, HTN, stroke , arthritis, lung nodules. He is seen in his room today. He denies any concerns. The numbness and tingling in his left leg has improved. His pain is under control. Had a bowel movement yesterday.   Review of Systems:  Constitutional: Negative for fever, chills, diaphoresis.  HENT: Negative for headache, congestion Eyes: Negative for eye pain, blurred vision, double vision and discharge.  Respiratory: Negative for cough, shortness of breath and wheezing.   Cardiovascular: Negative for chest pain, palpitations, leg swelling.  Gastrointestinal: Negative for heartburn, nausea, vomiting, abdominal pain Genitourinary: Negative for dysuria Musculoskeletal: Negative for back pain, falls Skin: Negative for itching, rash.  Neurological: Negative for dizziness, focal weakness Psychiatric/Behavioral: Negative for depression   Past Medical History  Diagnosis Date  . COPD (chronic obstructive pulmonary disease)   . Shortness of breath   . Asthma   . Arthritis   . GERD (gastroesophageal reflux disease)     no meds  . History of GI bleed   . HOH (hard of hearing)   . Full dentures   . Traumatic tear of lateral  meniscus of right knee     right knee  . Medial meniscus, posterior horn derangement     left knee  . Diabetes mellitus without complication   . Headache     difficulty with headaches- 1950's , again in 1967, 2008- again problems with migrraines   . Cancer     face- basal cell  . Hypertension   . Complication of anesthesia     after septoplasty and uvulectomy-was in icu- [facility] - 10 yrs. ago  . Stroke 1995    some speech and thought processes slow  . Sleep apnea     uses a cpap, most nights   . Pneumonia 2014    [facility]   Past Surgical History  Procedure Laterality Date  . Foot foreign body removal  1976    left foot -multiple pieces glass  . Appendectomy  1963  . Hemorroidectomy  1995  . Carpal tunnel release      left  . Colonoscopy    . Upper gi endoscopy    . Nasal septum surgery  1995  . Uvulopalatopharyngoplasty (uppp)/tonsillectomy/septoplasty  1995    same time with septoplasty-ended up ICU almost trached  . Tonsillectomy    . Knee arthroscopy Bilateral 01/17/2013    Procedure: ARTHROSCOPY KNEE BILATERAL WITH MEDIAL AND LATERAL MENISECTOMIES;  Surgeon: Lorn Junes, MD;  Location: [place];  Service: Orthopedics;  Laterality: Bilateral;  . Eye surgery      foreign body removed fr. eye, ? side   . Lumbar  laminectomy N/A 11/05/2014    Procedure: LEFT L3-4 MICRODISCECTOMY;  Surgeon: Jessy Oto, MD;  Location: [place];  Service: Orthopedics;  Laterality: N/A;  . Lumbar laminectomy/decompression microdiscectomy N/A 02/12/2015    Procedure: RE-DO LEFT L3-4 MICRODISCECTOMY;  Surgeon: Jessy Oto, MD;  Location: [place];  Service: Orthopedics;  Laterality: N/A;   Social History:   reports that he quit smoking about 19 years ago. His smoking use included Cigarettes. He has a 60 pack-year smoking history. He has never used smokeless tobacco. He reports that he drinks alcohol. He reports that he does not use illicit drugs.  Family History  Problem  Relation Age of Onset  . Cancer Mother   . Heart attack Father     Medications: Patient's Medications  New Prescriptions   No medications on file  Previous Medications   ACETAMINOPHEN (TYLENOL) 500 MG TABLET    Take by mouth every 6 (six) hours as needed.   BUDESONIDE-FORMOTEROL (SYMBICORT) 160-4.5 MCG/ACT INHALER    Inhale 2 puffs into the lungs 2 (two) times daily.   CHOLECALCIFEROL (VITAMIN D) 1000 UNITS CAPSULE    Take 3,000 Units by mouth daily.   FLUTICASONE (FLONASE) 50 MCG/ACT NASAL SPRAY    Place 2 sprays into both nostrils daily.   HYDROXYZINE (ATARAX/VISTARIL) 10 MG TABLET    Take 2 tablets by mouth at bedtime.   MAGNESIUM HYDROXIDE (MILK OF MAGNESIA) 400 MG/5ML SUSPENSION    Take 30 mLs by mouth daily.   METHOCARBAMOL (ROBAXIN) 500 MG TABLET    Take 1 tablet (500 mg total) by mouth every 6 (six) hours as needed for muscle spasms.   OXYCODONE-ACETAMINOPHEN (PERCOCET/ROXICET) 5-325 MG PER TABLET    Take 1 tablet by mouth every four hours for moderate pain *DO NOT EXCEED 4 GM OF TYLENOL IN 24 HOURS*  Take 2 tablets by mouth every 4 hours for severe pain * DO NOT EXCEED 4GM OF TYLENOL IN 24 HOURS*   POLYETHYLENE GLYCOL (MIRALAX / GLYCOLAX) PACKET    Take 17 g by mouth daily as needed.   SOLIFENACIN (VESICARE) 10 MG TABLET    Take 10 mg by mouth daily as needed.    TIOTROPIUM (SPIRIVA) 18 MCG INHALATION CAPSULE    Place 1 capsule (18 mcg total) into inhaler and inhale daily.  Modified Medications   No medications on file  Discontinued Medications   No medications on file     Physical Exam: Filed Vitals:   02/22/15 1621  BP: 99/56  Pulse: 80  Temp: 96.8 F (36 C)  Resp: 18  SpO2: 92%    General- elderly male, obese, in no acute distress Head- normocephalic, atraumatic Throat- moist mucus membrane Eyes- PERRLA, EOMI, no pallor, no icterus, no discharge, normal conjunctiva, normal sclera Neck- no cervical lymphadenopathy Cardiovascular- normal s1,s2, no murmurs, good  dorsalis pedis and radial pulses, no leg edema Respiratory- bilateral clear to auscultation, no wheeze, no rhonchi, no crackles, no use of accessory muscles Abdomen- bowel sounds present, soft, non tender Musculoskeletal- able to move all 4 extremities, lower extremity weakness Neurological- no focal deficit Skin- warm and dry, surgical incision in mid back healing well Psychiatry- alert and oriented to person, place and time, normal mood and affect    Labs reviewed: Basic Metabolic Panel:  Recent Labs  11/02/14 1448 02/07/15 1414  NA 137 137  K 4.6 4.1  CL 99 104  CO2 25 21  GLUCOSE 122* 127*  BUN 17 9  CREATININE 0.88 1.06  CALCIUM  9.5 9.2   Liver Function Tests:  Recent Labs  02/07/15 1414  AST 35  ALT 30  ALKPHOS 55  BILITOT 0.8  PROT 6.6  ALBUMIN 3.6   No results for input(s): LIPASE, AMYLASE in the last 8760 hours. No results for input(s): AMMONIA in the last 8760 hours. CBC:  Recent Labs  11/02/14 1448 02/07/15 1414  WBC 11.0* 9.0  HGB 15.9 14.4  HCT 46.4 43.1  MCV 92.2 91.7  PLT 179 180    Assessment/Plan  Lumbar herniated nucleus pulposus S/p redo left L3-4 microdiscectomy. Pain is adequately controlled with current regimen. Continue tylenol 500mg  of tylenol every six hours as needed for mild pain and percocet 5/325mg  1-2 tabs every four hours as needed for moderate to severe pain. Continue robaxin 500mg  every six hours as needed for muscle spasms. Will have him work with physical therapy and occupational therapy team to help with gait training and muscle strengthening exercises.fall precautions. Skin care. Encourage to be out of bed. Has orthopedic follow up  Constipation Continue miralax daily as needed for constipation. Encourage hydration. Continue milk of magnesia  Type 2 diabetes mellitus  Last A1c 6.9. Diet controlled.  Copd Stable. Continue spiriva and symbicort   OAB  Continue vesicare 10mg  daily as needed     Goals of care:  short term rehabilitation   Labs/tests ordered: none  Family/ staff Communication: reviewed care plan with patient and nursing supervisor    Blanchie Serve, MD  [facility] [phone] (Monday-Friday 8 am - 5 pm) [phone] (afterhours)

## 2015-02-27 ENCOUNTER — Encounter: Payer: Self-pay | Admitting: Registered Nurse

## 2015-02-27 ENCOUNTER — Non-Acute Institutional Stay: Payer: No Typology Code available for payment source | Admitting: Registered Nurse

## 2015-02-27 DIAGNOSIS — G8929 Other chronic pain: Secondary | ICD-10-CM

## 2015-02-27 DIAGNOSIS — J449 Chronic obstructive pulmonary disease, unspecified: Secondary | ICD-10-CM | POA: Diagnosis not present

## 2015-02-27 DIAGNOSIS — K5901 Slow transit constipation: Secondary | ICD-10-CM | POA: Diagnosis not present

## 2015-02-27 DIAGNOSIS — R51 Headache: Secondary | ICD-10-CM | POA: Diagnosis not present

## 2015-02-27 DIAGNOSIS — E119 Type 2 diabetes mellitus without complications: Secondary | ICD-10-CM | POA: Diagnosis not present

## 2015-02-27 DIAGNOSIS — N3281 Overactive bladder: Secondary | ICD-10-CM

## 2015-02-27 DIAGNOSIS — M5126 Other intervertebral disc displacement, lumbar region: Secondary | ICD-10-CM

## 2015-02-27 DIAGNOSIS — E559 Vitamin D deficiency, unspecified: Secondary | ICD-10-CM | POA: Diagnosis not present

## 2015-02-27 NOTE — Progress Notes (Signed)
Patient ID: Eddie Carter, male   DOB: March 17, 1941, 74 y.o.   MRN: [id number]   Place of Service: [facility]  Allergies  Allergen Reactions  . Asa [Aspirin] Shortness Of Breath  . Nsaids Other (See Comments)    Makes him bleed.    Code Status: DNR  Goals of Care: Comfort and Quality of Life/STR  Chief Complaint  Patient presents with  . Discharge Note    HPI 74 y.o. male with PMH of COPD, asthma, HTN, stroke , arthritis, lung nodules, headache among others is being seen for a discharge visit. He was here for short term rehabilitation post hospital admission from 02/12/15 to 02/14/15 for lumbar herniated pulposus s/p redo left L3-4 microdiscectomy on 02/12/15. He has worked well with therapy team and is ready to be discharged home with [facility] PT/OT. Seen in room today. Denies any concerns. Pain is adequately controlled with current regimen. No issues with constipation.  Review of Systems Constitutional: Negative for fever and chills HENT: Negative for ear pain, congestion, and sore throat.  Eyes: Negative for eye pain and eye discharge  Cardiovascular: Negative for chest pain, palpitations. Positive for leg sweling Respiratory: Negative cough, shortness of breath, and wheezing.  Gastrointestinal: Negative for nausea and vomiting. Negative for diarrhea and constipation Genitourinary: Negative for  dysuria, frequency, urgency, and hematuria Musculoskeletal: Positive for back pain Neurological: Negative for headache and dizziness.  Skin: Negative for rash    Psychiatric: Negative for depression  Past Medical History  Diagnosis Date  . COPD (chronic obstructive pulmonary disease)   . Shortness of breath   . Asthma   . Arthritis   . GERD (gastroesophageal reflux disease)     no meds  . History of GI bleed   . HOH (hard of hearing)   . Full dentures   . Traumatic tear of lateral meniscus of right knee     right knee  . Medial meniscus, posterior horn derangement     left  knee  . Diabetes mellitus without complication   . Headache     difficulty with headaches- 1950's , again in 1967, 2008- again problems with migrraines   . Cancer     face- basal cell  . Hypertension   . Complication of anesthesia     after septoplasty and uvulectomy-was in icu- [facility] - 10 yrs. ago  . Stroke 1995    some speech and thought processes slow  . Sleep apnea     uses a cpap, most nights   . Pneumonia 2014    [facility]    Past Surgical History  Procedure Laterality Date  . Foot foreign body removal  1976    left foot -multiple pieces glass  . Appendectomy  1963  . Hemorroidectomy  1995  . Carpal tunnel release      left  . Colonoscopy    . Upper gi endoscopy    . Nasal septum surgery  1995  . Uvulopalatopharyngoplasty (uppp)/tonsillectomy/septoplasty  1995    same time with septoplasty-ended up ICU almost trached  . Tonsillectomy    . Knee arthroscopy Bilateral 01/17/2013    Procedure: ARTHROSCOPY KNEE BILATERAL WITH MEDIAL AND LATERAL MENISECTOMIES;  Surgeon: Lorn Junes, MD;  Location: [place];  Service: Orthopedics;  Laterality: Bilateral;  . Eye surgery      foreign body removed fr. eye, ? side   . Lumbar laminectomy N/A 11/05/2014    Procedure: LEFT L3-4 MICRODISCECTOMY;  Surgeon: Daleen Bo  Louanne Skye, MD;  Location: [place];  Service: Orthopedics;  Laterality: N/A;  . Lumbar laminectomy/decompression microdiscectomy N/A 02/12/2015    Procedure: RE-DO LEFT L3-4 MICRODISCECTOMY;  Surgeon: Jessy Oto, MD;  Location: [place];  Service: Orthopedics;  Laterality: N/A;    History  Substance Use Topics  . Smoking status: Former Smoker -- 1.50 packs/day for 40 years    Types: Cigarettes    Quit date: 01/14/1996  . Smokeless tobacco: Never Used  . Alcohol Use: Yes     Comment: occ    Family History  Problem Relation Age of Onset  . Cancer Mother   . Heart attack Father       Medication List       This list is accurate as of: 02/27/15   1:41 PM.  Always use your most recent med list.               acetaminophen 500 MG tablet  Commonly known as:  TYLENOL  Take by mouth every 6 (six) hours as needed.     budesonide-formoterol 160-4.5 MCG/ACT inhaler  Commonly known as:  SYMBICORT  Inhale 2 puffs into the lungs 2 (two) times daily.     fluticasone 50 MCG/ACT nasal spray  Commonly known as:  FLONASE  Place 2 sprays into both nostrils daily.     hydrOXYzine 10 MG tablet  Commonly known as:  ATARAX/VISTARIL  Take 2 tablets by mouth at bedtime.     magnesium hydroxide 400 MG/5ML suspension  Commonly known as:  MILK OF MAGNESIA  Take 30 mLs by mouth daily.     methocarbamol 500 MG tablet  Commonly known as:  ROBAXIN  Take 1 tablet (500 mg total) by mouth every 6 (six) hours as needed for muscle spasms.     oxyCODONE-acetaminophen 5-325 MG per tablet  Commonly known as:  PERCOCET/ROXICET  Take 1 tablet by mouth every four hours for moderate pain *DO NOT EXCEED 4 GM OF TYLENOL IN 24 HOURS*  Take 2 tablets by mouth every 4 hours for severe pain * DO NOT EXCEED 4GM OF TYLENOL IN 24 HOURS*     polyethylene glycol packet  Commonly known as:  MIRALAX / GLYCOLAX  Take 17 g by mouth daily as needed.     solifenacin 10 MG tablet  Commonly known as:  VESICARE  Take 10 mg by mouth daily as needed.     tiotropium 18 MCG inhalation capsule  Commonly known as:  SPIRIVA  Place 1 capsule (18 mcg total) into inhaler and inhale daily.     Vitamin D 1000 UNITS capsule  Take 3,000 Units by mouth daily.        Physical Exam  BP 131/70 mmHg  Pulse 76  Temp(Src) 98 F (36.7 C)  Resp 16  Ht 6' (1.829 m)  Wt 307 lb (139.254 kg)  BMI 41.63 kg/m2  SpO2 94%  Constitutional: Obese elderly male in no acute distress. Conversant and pleasant HEENT: Normocephalic and atraumatic. PERRL. EOM intact. No icterus. No nasal discharge or sinus tenderness. Oral mucosa moist. Posterior pharynx clear of any exudate or lesions. Teeth  and gingiva in good general condition.  Neck: Supple and nontender. No lymphadenopathy, masses, or thyromegaly. No JVD or carotid bruits. Cardiac: Normal S1, S2. RRR without appreciable murmurs, rubs, or gallops. Distal pulses intact. Trace dependent edema of BLE Lungs: No respiratory distress. Breath sounds clear bilaterally without rales, rhonchi, or wheezes. Abdomen: Audible bowel sounds in all quadrants. Soft, nontender, nondistended.  Musculoskeletal: able to move all extremities w/ generalized weakness. Lower back tender to palpation-surgical incision w/o signs of infection. Skin: Warm and dry. No rash noted.  Neurological: Alert and oriented to person, place, and time. No focal deficits.  Psychiatric: Judgment and insight adequate. Appropriate mood and affect.   Labs Reviewed  CBC Latest Ref Rng 02/07/2015 11/02/2014 01/17/2013  WBC 4.0 - 10.5 K/uL 9.0 11.0(H) -  Hemoglobin 13.0 - 17.0 g/dL 14.4 15.9 15.0  Hematocrit 39.0 - 52.0 % 43.1 46.4 44.0  Platelets 150 - 400 K/uL 180 179 -    CMP Latest Ref Rng 02/07/2015 11/02/2014 01/17/2013  Glucose 70 - 99 mg/dL 127(H) 122(H) 119(H)  BUN 6 - 23 mg/dL 9 17 13   Creatinine 0.50 - 1.35 mg/dL 1.06 0.88 1.00  Sodium 135 - 145 mmol/L 137 137 140  Potassium 3.5 - 5.1 mmol/L 4.1 4.6 3.8  Chloride 96 - 112 mmol/L 104 99 107  CO2 19 - 32 mmol/L 21 25 -  Calcium 8.4 - 10.5 mg/dL 9.2 9.5 -  Total Protein 6.0 - 8.3 g/dL 6.6 - -  Total Bilirubin 0.3 - 1.2 mg/dL 0.8 - -  Alkaline Phos 39 - 117 U/L 55 - -  AST 0 - 37 U/L 35 - -  ALT 0 - 53 U/L 30 - -    Lab Results  Component Value Date   HGBA1C 6.9* 11/05/2014    Diagnostic Studies Reviewed 01/14/15: MRI Lumbar spine:  1. L3-L4 rightward and cephalad disc extrusion with suspected sequestered fragment 10 x 17 x 18 mm. 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 2. Postoperative changes on the left at L3-L4 since 10/29/2014, and otherwise stable lumbar spine.  Assessment &  Plan 1. HNP (herniated nucleus pulposus), lumbar S/p redo left L3-4 microdiscectomy. Pain is adequately controlled with current regimen. Continue tylenol 500mg  of tylenol every six hours as needed for mild pain and percocet 5/325mg  1-2 tabs every four hours as needed for moderate to severe pain with robaxin 500mg  every six hours as needed for muscle spasms. Continue to work with [facility] PT/OT for gait/strength/balance training to restore/maximize function and continue f/u with ortho.   2. Constipation, unspecified constipation type Stable. Continue milk of magnesia daily with miralax daily as needed for constipation. Encourage hydration and ambulation as tolerated.   3. Chronic obstructive pulmonary disease, unspecified COPD, unspecified chronic bronchitis type Stable. Continue spiriva 18cmg  daily and symbicort 160/4.5mg  2 puffs twice daily. Continue to f/u with PCP  4. Vitamin D deficiency Continue vit D 3000 iu daily  5. OAB (overactive bladder) Continue vesicare 10mg  daily as needed   6. Chronic nonintractable headache, unspecified headache type Stable.. Continue tylenol 500mg  of tylenol every six hours as needed for mild pain and percocet 5/325mg  1-2 tabs every four hours as needed for moderate to severe pain.    7. Type 2 diabetes mellitus, diet-controlled.  Last A1c 6.9. Currently diet-controlled. Continue to f/u with PCP  Home health services: PT/OT DME required: None PCP follow-up: Dr. Annice Pih on 03/06/15 @ 11:15am.  30-day supply of prescription medications provided (#30 Percocet 5/325mg , #60 Robaxin 500mg )  Time spent: 40 minutes on care coordination and counseling   Family/Staff Communication Plan of care discussed with patient and nursing staff. Patient and nursing staff verbalized understanding and agree with plan of care. No additional questions or concerns reported.    Arthur Holms, MSN, [facility] [address] [place], [place] [address] [phone]  [8am-5pm]  After hours: [phone]

## 2015-03-15 NOTE — Discharge Summary (Signed)
PATIENT NAME:  Eddie Carter, Eddie Carter MR#:  [id number] DATE OF BIRTH:  12/20/1940  For a detailed note, please take a look at the history and physical done on admission by Dr. Laurin Coder.   DIAGNOSES AT DISCHARGE:  1.  Chronic obstructive pulmonary disease exacerbation.  2.  New-onset diabetes.  3.  Morbid obesity.  4.  Obstructive sleep apnea.  5.  Hypertension.  6.  Benign prostatic hypertrophy.  7.  Gastroesophageal reflux disease.   DIET: The patient is being discharged on American Diabetic Association, low sodium, low fat diet.   ACTIVITY: As tolerated.   FOLLOW-UP: Dr. Burt Ek in the next 1 to 2 weeks.   DISCHARGE MEDICATIONS: Plavix 75 mg daily, hydroxyzine 10 mg two tabs at bedtime, Spiriva 1 puff daily, VESIcare 10 mg daily, Avodart 0.5 mg daily, Flomax 0.4 mg daily, Symbicort 160/4.5 two puffs b.i.d., Singulair 10 mg daily, Lasix 40 mg daily, AndroGel 2 pumps transdermal daily, glipizide 5 mg one tab b.i.d., prednisone taper starting at 60 mg down to 10 mg over the next 6 days, Levaquin 5 mg daily x5 days, Levemir 15 units subcutaneously at bedtime.   PERTINENT STUDIES DONE DURING THE HOSPITAL COURSE: A chest x-ray done on admission showing diffuse pulmonary interstitial prominence with nodularity. Active interstitial lung disease cannot be excluded. Followup chest x-ray suggest and demonstrate clearing.   HOSPITAL COURSE: This is a 74 year old male with medical problems as mentioned above, who presented to the hospital secondary to shortness of breath and cough and likely COPD exacerbation .   1.  COPD exacerbation. This was likely secondary to an acute bronchitis/pneumonitis. The patient was managed aggressively with IV steroids, around-the-clock nebulizer treatments, also maintained on Symbicort and Spiriva. The patient was also treated with empiric Levaquin. After getting 2 days of aggressive therapy, the patient's clinical symptoms have improved. Although he was ambulated on room air,  it did desaturate to lower than 88%. Therefore, oxygen was arranged for him prior to discharge. Presently the patient is being discharged on a prednisone taper along with oral Levaquin and maintenance of his Symbicort and Spiriva as stated.  2.  Obstructive sleep apnea. The patient is compliant with his CPAP. He will continue that.  3.  BPH. The patient had no evidence of urinary obstruction. He will continue his Avodart and Flomax.  4.  Overactive bladder. The patient was maintained on his VESIcare. He will resume that.  5.  Acute renal failure. This was likely secondary to his hyperglycemia and dehydration. He was hydrated with IV fluids and his BUN and creatinine have now normalized.  6.  New-onset diabetes. The patient's blood sugars were persistently elevated. It was first thought that he may have hyperglycemia related to high-dose IV steroids, although his hemoglobin A1c was as high as 8.8. He probably has diabetes, likely type 2 diabetes. He was started on oral glipizide and also some Levemir. He will continue that for now and further titrations to his antidiabetic meds should be made by his primary care physician. A diabetic lifestyle consult was obtained while in the hospital. The patient was provided with a glucometer. He was also taught how to give himself insulin and he is aware how to do so.   CODE STATUS: FULL CODE.   DISPOSITION: Home.   TIME SPENT: 40 minutes.    ____________________________ Belia Heman. Verdell Carmine, MD [id number] D: 07/28/2013 16:32:38 ET T: 07/28/2013 18:44:50 ET JOB#: [id number]  cc: Belia Heman. Verdell Carmine, MD, <Dictator> Nelda Severe. Burt Ek, MD [place]  MD ELECTRONICALLY SIGNED 08/05/2013 15:11
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
## 2015-03-16 NOTE — H&P (Signed)
PATIENT NAME:  Eddie Carter, Eddie Carter MR#:  [id number] DATE OF BIRTH:  07/15/1941  DATE OF ADMISSION:  10/29/2014  REFERRING PHYSICIAN:  Loney Hering, MD   PRIMARY CARE PHYSICIAN:  Nelda Severe. Burt Ek, MD   ADMISSION DIAGNOSIS:  Lumbosacral pain with worsening bowel and bladder control.   HISTORY OF PRESENT ILLNESS:  This is a 74 year old Caucasian male who presents to the Emergency Department complaining of worsening lower back pain, hip pain, and difficulty walking. The patient suffered a fall, where he injured his left knee and landed on his left hip approximately 2 weeks ago. He was evaluated in the Emergency Room at that time with x-rays, which revealed some degenerative disk changes, as well as overall degenerative joint disease. Since that time, he has had weakness when walking, unsteadiness when trying to ambulate, and pain in the aforementioned areas. He also admits to more difficulty sustaining a push when he has a bowel movement as well as more hesitancy with urination when he tries to relieve himself. For these reasons, the Emergency Department called for admission.   REVIEW OF SYSTEMS: CONSTITUTIONAL:  The patient denies fever but admits to weakness in his lower extremities.  EYES:  Denies blurred vision or inflammation.  EARS, NOSE, AND THROAT:  Denies tinnitus or difficulty swallowing.  RESPIRATORY:  Denies cough or shortness of breath.  CARDIOVASCULAR:  Denies chest pain, palpitations, orthopnea, or paroxysmal nocturnal dyspnea.  GASTROINTESTINAL:  Denies nausea, vomiting, diarrhea, or abdominal pain.  GENITOURINARY:  Denies dysuria or increased frequency but does admit to increased hesitancy of urination.  ENDOCRINE:  Denies polyuria or polydipsia.  HEMATOLOGIC AND LYMPHATIC:  Denies easy bruising or bleeding.  INTEGUMENT:  Denies rashes but admits to a history of lesions on his nose as well as skin changes on his shins.  MUSCULOSKELETAL:  Denies myalgias but admits to  arthralgias in his hips and back.  NEUROLOGIC:  Denies numbness in his extremities or difficulty speaking.  PSYCHIATRIC:  Denies depression or suicidal ideation.   PAST MEDICAL HISTORY:  Benign prostatic hypertrophy, hypertension, congestive heart failure, COPD, diabetes, irritable bowel syndrome, and a history of cerebrovascular accident.  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   SOCIAL HISTORY:  The patient quit smoking 20 years ago. Prior to that, he had a 40-pack-year history. He does not do any drugs but does drink alcohol sometimes. He lives with his wife, and his son, who is a Marine scientist in the Emergency Department, helps with his medical care. Overall, prior to this visit, the patient was very functional.   FAMILY HISTORY:  Heart disease.   MEDICATIONS: 1.  Ambien 10 mg 1 tab p.o. at bedtime as needed for insomnia.  2.  Avodart 0.5 mg oral capsule 1 capsule p.o. daily.  3.  Docusate sodium 100 mg 1 capsule p.o. daily.  4.  Fish oil 1000 mg 1 capsule p.o. daily.  5.  Flomax 0.4 mg 1 capsule p.o. at bedtime.  6.  Furosemide 40 mg 1 tablet p.o. daily.  7.  Hydroxyzine 10 mg 2 tablets p.o. at bedtime as needed.  8.  Levemir 16 units subcutaneously once daily.  9.  Losartan 25 mg 1 tablet p.o. daily.  10.  Metformin 500 mg 1 tablet p.o. daily.  11.  Soma 750 mg 1 tablet p.o. every 6 hours as needed for muscle spasms.  12.  Plavix 75 mg  1 tab p.o. daily.  13.  Spiriva 18 mcg 1 capsule inhaled daily.  14.  Tramadol 50 mg 1 tablet p.o. b.i.d.  15.  VESIcare 10 mg 1 tablet p.o. daily.  16.  Vitamin D3, 1000 international units 1 capsule p.o. daily.   ALLERGIES:  ANTI-INFLAMMATORY MEDICATIONS, ASPIRIN, AND TAGAMET.   PERTINENT LABORATORY RESULTS AND RADIOGRAPHIC FINDINGS:  Serum glucose is 126, BUN 22, creatinine 1.14, sodium 138, potassium 3.9, chloride  100, bicarbonate 28, calcium 8.9, serum albumin 3.8, alkaline phosphatase 69, AST 37, and ALT is 69. Troponin is negative. White blood cell count is 14, hemoglobin 17.5, hematocrit 53.7, and platelet count is 189,000.   X-ray of the left hip shows degenerative changes in the hips but no displaced fractures. X-ray of the left knee is negative for any bony abnormality. X-ray of the lumbar spine shows mild degenerative disk and facet disease and stable sclerosis of L2, but no acute abnormalities. CT scan of the abdomen and pelvis shows a normal appendix, but there is some fatty infiltration of the liver and a 5.7 mm nodule on the right lung base.    PHYSICAL EXAMINATION: VITAL SIGNS:  Temperature is 98.2, pulse 82, respirations 18, blood pressure 125/79, and pulse oximetry is 98% on room air.  GENERAL:  The patient is alert and oriented x 3. He is in mild pain, but otherwise no acute distress.  HEENT:  Normocephalic, atraumatic. Pupils are equal, round, and reactive to light and accommodation. Extraocular movements are intact. Mucous membranes are tacky.  NECK:  Trachea is midline. No adenopathy.  CHEST:  Symmetric and atraumatic.  CARDIOVASCULAR:  Regular rate and rhythm. Normal S1 and S2. No rubs, clicks, or murmurs appreciated.  LUNGS:  Clear to auscultation bilaterally. Normal effort and excursion.  ABDOMEN:  Positive bowel sounds. Soft, nontender, nondistended. No hepatosplenomegaly, although he does have an area of point tenderness on his left flank, presumably after his fall.  GENITOURINARY:  Deferred.  MUSCULOSKELETAL:  The patient moves his upper extremities with full range of motion, but his left lower extremity is limited in range of motion by pain. He has 4+/5 strength in his left lower extremity, and this too may be limited by pain.  SKIN:  No rashes, but the patient has a reddening and thickening with apparent friability of the skin over the anterior lower extremities bilaterally.   EXTREMITIES:  No clubbing, cyanosis or edema.  NEUROLOGIC:  Cranial nerves II through XII are grossly intact.  PSYCHIATRIC:  Mood is normal. Affect is congruent.   ASSESSMENT AND PLAN:  This is a 74 year old male admitted for lower back pain associated with worsening bowel and bladder function.   1.  Lumbosacral pain. The pain has worsened after his fall, which indicates that he may have exacerbated some of his degenerative disk disease and/or degenerative joint disease, particularly in the sacroiliac area. He has some pain to palpation on the left lateral aspect of the hip and also has pain elicited from palpation of the SI joint directly as well as with contralateral pressure placed with passive range of motion. However, his difficulty bearing down when having a bowel movement and more hesitancy of urination is concerning, particularly in light of his right lower lung nodule. Differential diagnosis for his symptoms include his underlying benign prostatic hypertrophy and irritable bowel syndrome; however, he could also have some kind of bony metastasis that has yet to form a lytic lesion or he has increased calcium metabolism that may be causing some  bone pain. I have given the patient a dose of Decadron here in the Emergency Department and order an MRI of his lumbosacral spine. I have also consulted physical therapy. 2.  Congestive heart failure. This is not certain, although he could have some mild diastolic dysfunction. He certainly has some venous insufficiency, but he has 40 mg of Lasix on his home medication list, which we will continue in the hospital.  3.  Hypertension. Continue losartan.  4.  Diabetes type 2. Continue basal insulin coverage, as well as once daily metformin. I will check the patient's hemoglobin A1c. In the past, his blood sugars have increased due to steroid therapy, so I have also prescribed sliding scale insulin in the hospital since he is getting Decadron.  5.  Chronic  obstructive pulmonary disease, stable. Continue Spiriva.  6.  Benign prostatic hypertrophy. Continue finasteride and tamsulosin.  7.  Irritable bowel syndrome. The patient is usually constipated. I have started him on a good bowel regimen.  8.  History of cerebrovascular accident. Continue Plavix and check lipids.  9.  Obesity. We need to record the patient's height, but he is obviously obese. I do not have his BMI calculated at this time, but I have encouraged portion control and exercise such as water aerobics for example.  10.  Deep vein thrombosis prophylaxis. Heparin.  11.  Gastrointestinal prophylaxis is unnecessary, as the patient is not critically ill.   CODE STATUS:  The patient is a full code.   Time spent on admission orders and patient care:  Approximately 40 minutes.     ____________________________ Norva Riffle. Marcille Blanco, MD msd:nb D: 10/29/2014 06:58:18 ET T: 10/29/2014 07:09:37 ET JOB#: [id number]  cc: Norva Riffle. Marcille Blanco, MD, <Dictator> Norva Riffle DIAMOND MD ELECTRONICALLY SIGNED 11/08/2014 20:19

## 2015-03-16 NOTE — Discharge Summary (Signed)
PATIENT NAME:  Eddie Carter, RIERA MR#:  [id number] DATE OF BIRTH:  1941-09-22  DATE OF ADMISSION:  10/29/2014 DATE OF DISCHARGE:  10/30/2014  ADMITTING DIAGNOSIS:  1. Severe back pain due to L3-4 subarticular zone disk extrusion with cranial migration, also compression of the left L3 nerve, severe disc degeneration L5-S1, with foraminal stenosis. The patient has an outpatient orthopedic evaluation on December 7th for evaluation of his back. The patient is to keep that appointment.  2. History of benign prostatic hypertrophy.  3. Hypertension.  4. Chronic diastolic congestive heart failure.  5. Hypertension.  6. Type 2 diabetes.  7. Chronic obstructive pulmonary disease without any evidence of exacerbation.  8. Irritable bowel syndrome.  9. History of cerebrovascular accident.  10. Morbid obesity.  11. Status post adult circumcision.  12. Repair of Peyronie disease.  13. Status post arthroscopy of both knees.  14. Status post tonsillectomy.  15. Status post adenoidectomy.  16. 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   PERTINENT LABS AND EVALUATIONS: Admitting glucose 126, BUN 22, creatinine 1.14, sodium 138, potassium 3.9, chloride 100, bicarbonate 28, calcium 8.9, serum albumin 3.8 and WBC count 14,000, hemoglobin 17.5, platelet count was 189,000. X-ray of the left hip shows degenerative changes in the hip, but no displaced fracture. X-ray of the left knee is negative. CT scan of the abdomen and pelvis showed normal appendix, but there is some fatty infiltration of the liver, 5.7 mm nodule of the lung. MRI of the lumbar spine shows L3-4 subarticular zone disc extrusion, disc compressing the descending left L3 nerve, L5-S1 severe degeneration with a right greater than left foraminal stenosis.   HOSPITAL COURSE: Please refer to H and P done by the admitting physician. The patient is a 74 year old, morbidly obese male with chronic back pain with chronic degenerative disc disease, which has  progressively gotten worse. The patient actually is supposed to see an orthopedist for his back on December 7th.  Presented with worsening complaints, some difficulty with ambulation. The patient had fallen as well. Due to these symptoms, he was admitted under observation for pain control. He underwent an MRI, which confirmed the findings as above, which likely is a chronic ongoing issue. He will be followed up by orthopedics. He was seen by PT,  recommended home PT with walker. The patient is ambulating with a walker and is stable for discharge. He needs to make sure that he is seen by orthopedics as an outpatient.   DISCHARGE MEDICATIONS: Plavix 75 p.o. daily, Spiriva 18 mcg daily, VESI care 10 daily, Avodart 0.5 daily, fish oil 1000 mg daily, Levemir 16 units at bedtime, metformin 500 daily, Colace 100 p.o. daily, Lasix 40 daily, losartan 25 p.o. daily, Ambien 10 at bedtime, hydroxyzine 10 mg 2 tabs at bedtime, methocarbamol 750 mg 1 tab p.o. q.6 as needed, Flomax 0.4 daily, tramadol 50 mg 1 tab p.o. b.i.d., vitamin D3 1000 International Units 3 tabs daily, Symbicort 2 puffs b.i.d., acetaminophen oxycodone 325/7.5 mg 1 tab p.o. q.6 p.r.n. for pain, Medrol DosePak to be tapered over the next 8 days.   DIET: Low sodium, low fat, low cholesterol, carbohydrate -controlled diet.   ACTIVITY: As tolerated with a walker.   FOLLOWUP:  Referral for physical therapy and nurse. Follow up with primary M.D. in 1 to 2 weeks. Follow up with orthopedics tomorrow as scheduled.   TIME SPENT ON THIS DISCHARGE: 35 minutes.     ____________________________ Lafonda Mosses Posey Pronto, MD shp:kl D: 10/31/2014 11:16:36 ET T: 10/31/2014  13:38:41 ET JOB#: [id number]  cc: Tensley Wery H. Posey Pronto, MD, <Dictator> Alric Seton MD ELECTRONICALLY SIGNED 11/02/2014 17:12

## 2015-04-13 IMAGING — CT CT CHEST W/ CM
2 of 3 series · 15 of 36 positions shown, 18 images · IV contrast (75CC OMNI 300)
Comparison: High-resolution chest CT, 09/28/2013.

CLINICAL DATA: Previous CT demonstrated 5 mm right lower lobe
nodule. Ex-smoker. Patient with chronic cough and short of breath
with exertion.

EXAM:
CT CHEST WITH CONTRAST
TECHNIQUE: Multidetector CT imaging of the chest was performed during
intravenous contrast administration.
CONTRAST:  75mL OMNIPAQUE IOHEXOL 300 MG/ML  SOLN

[Series 2: chest with · axial · 0.78mm/px · z∈[-373,-73]mm · 12 of 72 slices shown, 15 images]
[im 6/72  mediastinal]
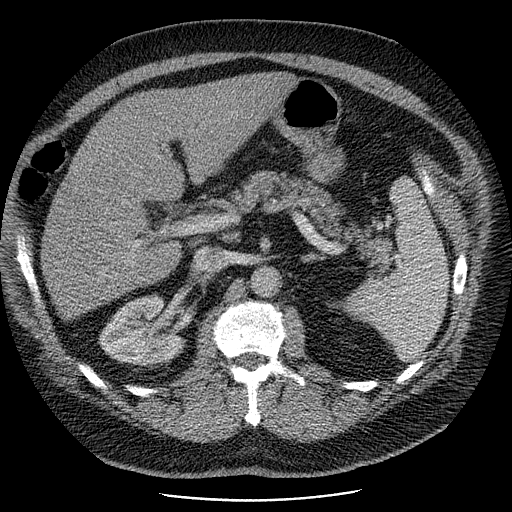
[im 6/72  lung]
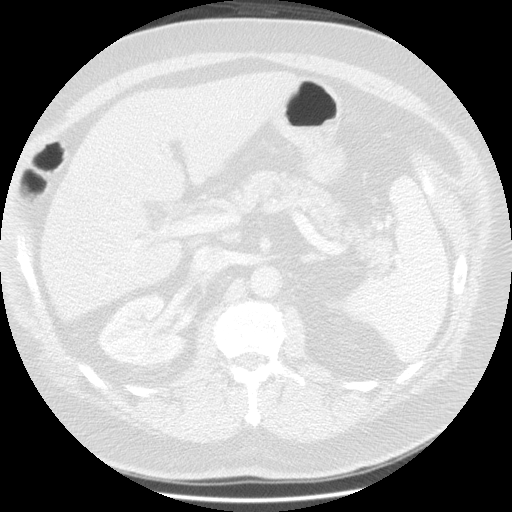
[im 11/72  lung]
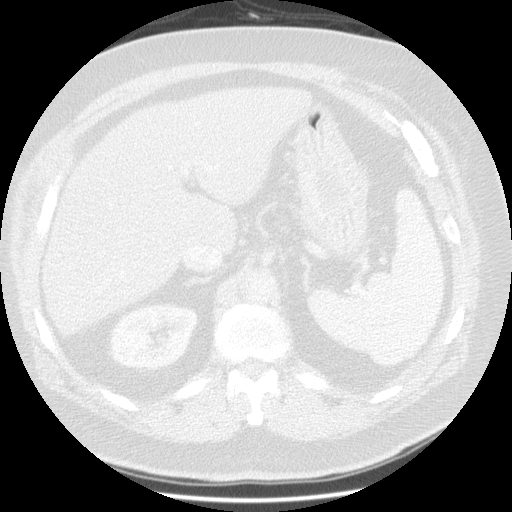
[im 16/72  lung]
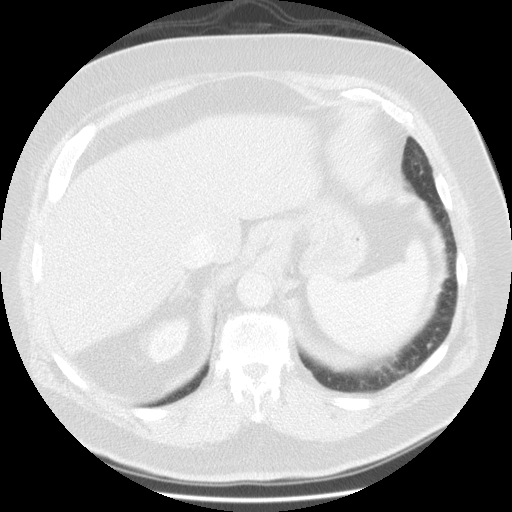
[im 22/72  lung]
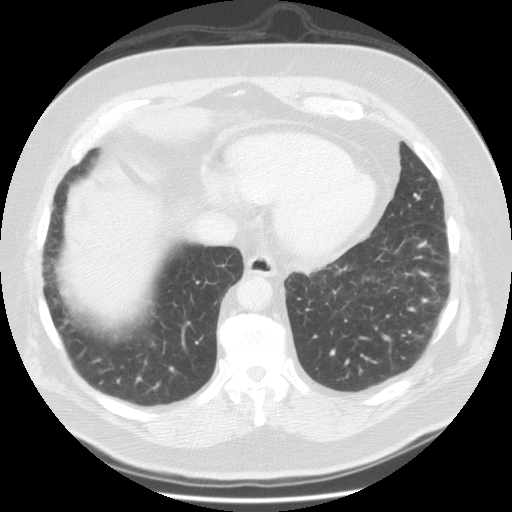
[im 27/72  mediastinal]
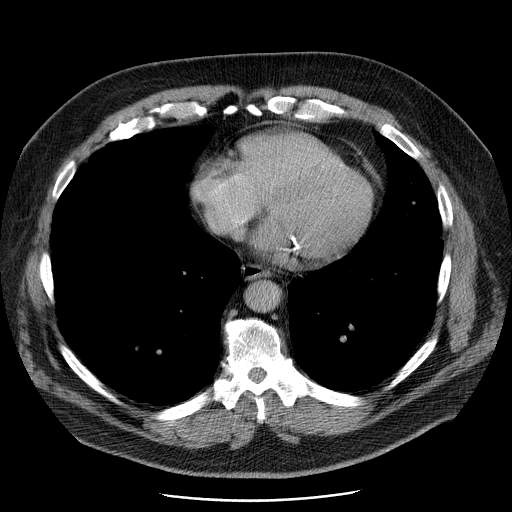
[im 27/72  lung]
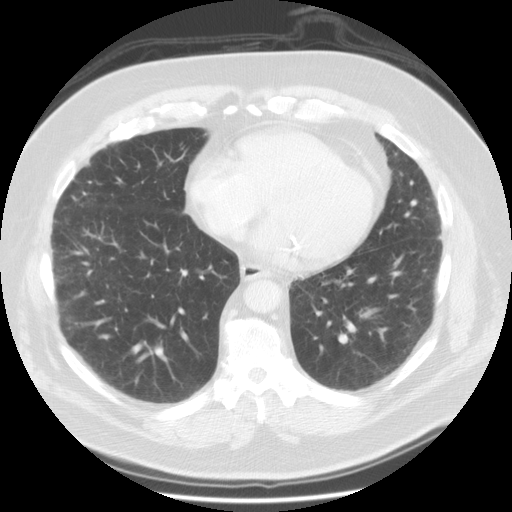
[im 32/72  lung]
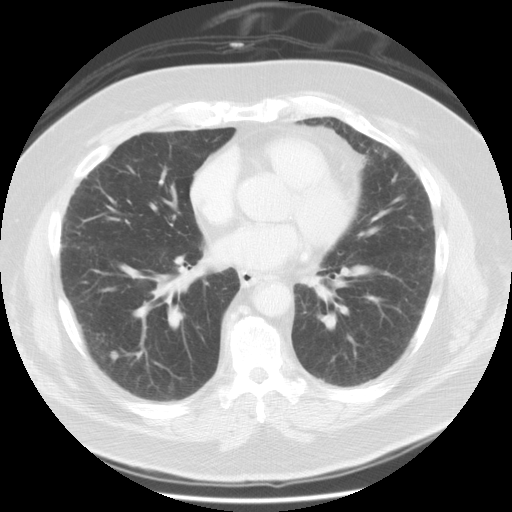
[im 40/72  lung]
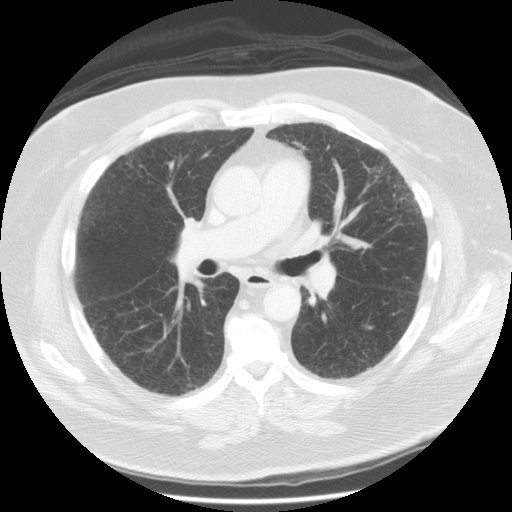
[im 45/72  lung]
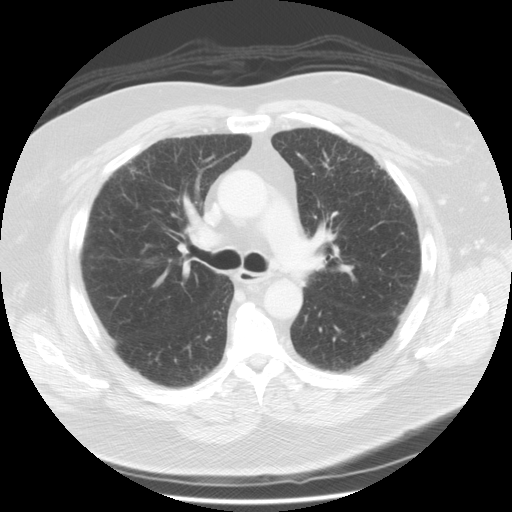
[im 50/72  mediastinal]
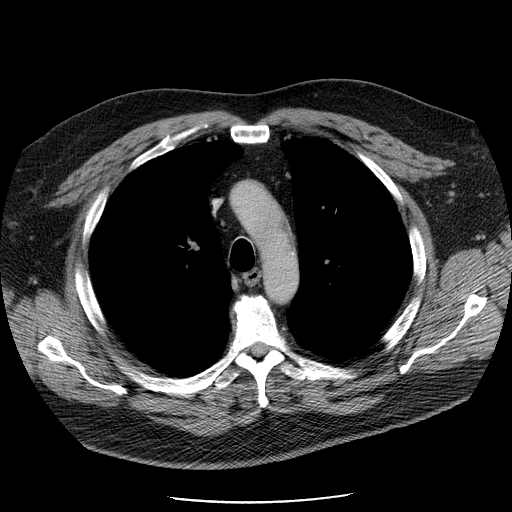
[im 50/72  lung]
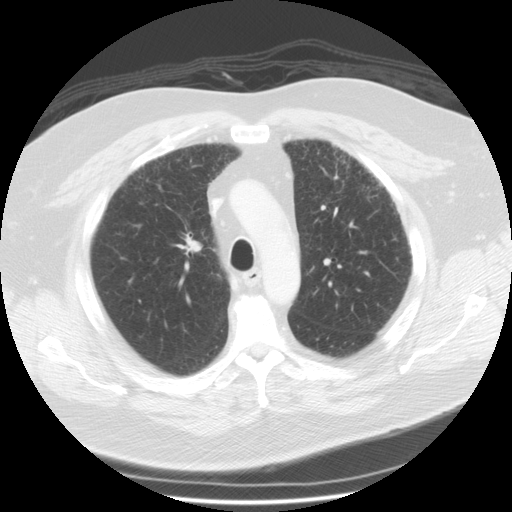
[im 56/72  lung]
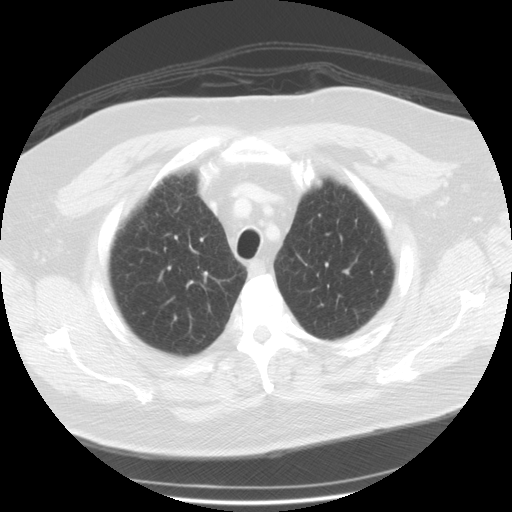
[im 61/72  lung]
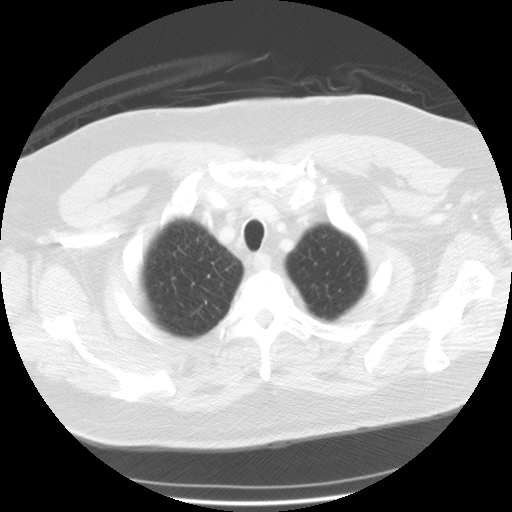
[im 66/72  lung]
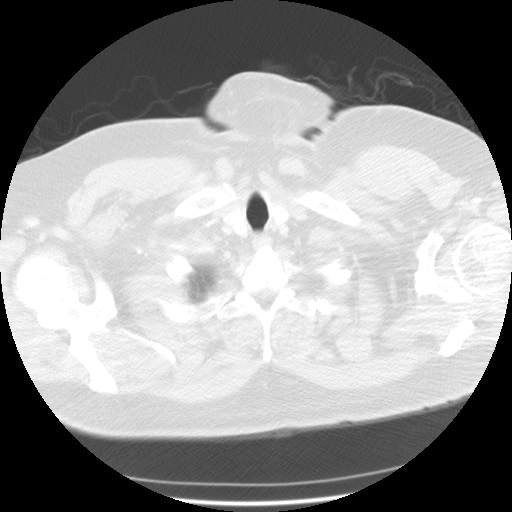

[Series 400: cor · coronal · 0.78mm/px · 3 of 157 slices shown]
[im 32/157  lung]
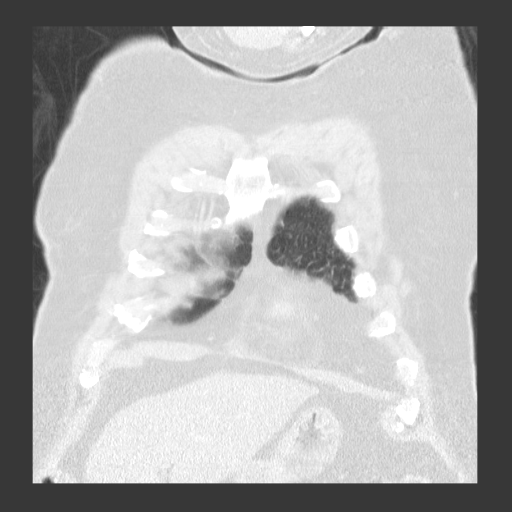
[im 63/157  lung]
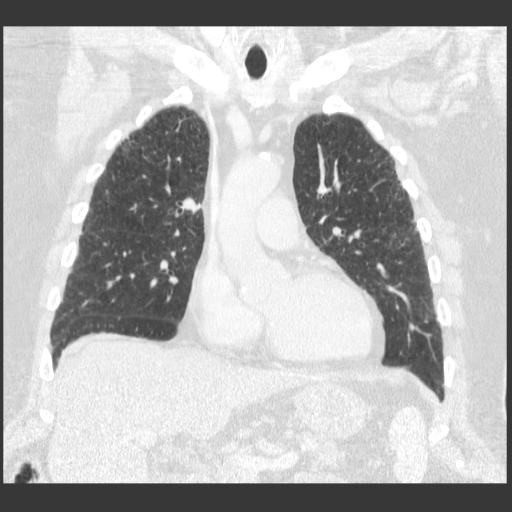
[im 94/157  lung]
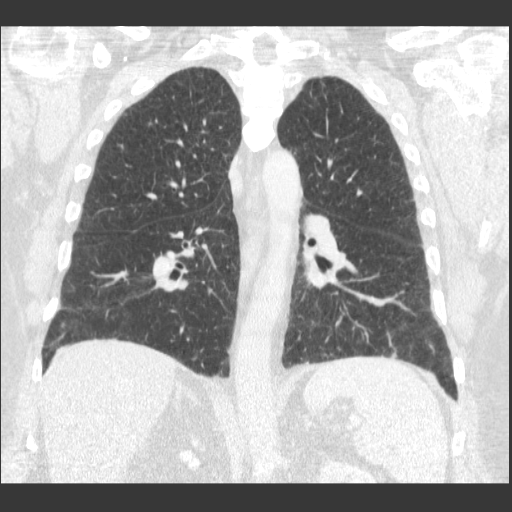

[15 of 36 positions shown; findings below may reference images not displayed]

FINDINGS: Pulmonary nodules described on the prior study are stable. 6 mm
nodule in the left upper lobe, image 36, series 3, 6 mm nodule in
the left lower lobe at the lateral diaphragmatic base, image-OB, and
6 mm right lower lobe nodule, image 42. No new nodules.

Areas of subpleural and peribronchovascular reticulation are stable
consistent with mild interstitial fibrosis.

No lung consolidation or edema. No pleural effusion or pneumothorax.

Heart is normal in size and configuration. Mild coronary artery
calcifications. No neck base or axillary masses or adenopathy.
Mildly prominent mediastinal lymph nodes. Largest is a 1 cm short
axis left para carinal node. Nodes are stable. No hilar masses or
adenopathy.

Fatty infiltration of the liver, also stable.

Degenerative spurring is noted throughout the visualized spine. No
osteoblastic or osteolytic lesions.
IMPRESSION: 1. Stable pulmonary nodules. Nodules are all highly likely to be
benign. Recommend an additional chest CT in 1 year to document 2
years of stability. No new nodules.
2. No acute findings.
3. Stable changes of mild interstitial fibrosis.

## 2015-04-15 ENCOUNTER — Ambulatory Visit: Payer: No Typology Code available for payment source | Admitting: Diagnostic Neuroimaging

## 2015-04-18 ENCOUNTER — Other Ambulatory Visit: Payer: Self-pay | Admitting: Internal Medicine

## 2015-05-21 ENCOUNTER — Encounter: Payer: Self-pay | Admitting: Diagnostic Neuroimaging

## 2015-05-21 ENCOUNTER — Ambulatory Visit (INDEPENDENT_AMBULATORY_CARE_PROVIDER_SITE_OTHER): Payer: PRIVATE HEALTH INSURANCE | Admitting: Diagnostic Neuroimaging

## 2015-05-21 VITALS — BP 109/65 | HR 68 | Ht 72.0 in | Wt 309.2 lb

## 2015-05-21 DIAGNOSIS — Z79899 Other long term (current) drug therapy: Secondary | ICD-10-CM

## 2015-05-21 DIAGNOSIS — R448 Other symptoms and signs involving general sensations and perceptions: Secondary | ICD-10-CM

## 2015-05-21 DIAGNOSIS — M5416 Radiculopathy, lumbar region: Secondary | ICD-10-CM

## 2015-05-21 DIAGNOSIS — E1142 Type 2 diabetes mellitus with diabetic polyneuropathy: Secondary | ICD-10-CM | POA: Diagnosis not present

## 2015-05-21 NOTE — Progress Notes (Signed)
[facility]  PATIENT: Eddie Carter DOB: [birth year]  REFERRING CLINICIAN: Louanne Skye HISTORY FROM: patient  REASON FOR VISIT: new consult    HISTORICAL  CHIEF COMPLAINT:  Chief Complaint  Patient presents with  . Balance issues    rm 7, "pain, weakness, injured 07/2014"    HISTORY OF PRESENT ILLNESS:   74 year old male with migraine, skin cancer, diabetes, hypertension, stroke, COPD, here for evaluation of balance and gait difficulty. September 2015 patient was injured when he was working in his office, book case fell over and he caught it to prevent it from falling down. He felt a pop sensation in his left lower back radiating down his left leg. He was evaluated and diagnosed with disc herniation and treated with decompression in December 2015 with improvement in symptoms.  Generator 2016 patient slipped on ice and had recurrent pain, and bilateral legs, found to have recurrent disc herniation. He is treated with surgery and fiber 2016 with some improvement symptoms.  Since that time he has not been able to return to his prior level of functioning. He has generalized deconditioning, weakness, pain, as well as fear of falling and balance difficulties. Patient referred to me for consideration of alternate explanation of balance and gait difficulties besides his lumbar spine disease and lumbar radiculopathy.   REVIEW OF SYSTEMS: Full 14 system review of systems performed and notable only for fatigue hearing loss itching blurred vision short of breath cough wheezing memory loss headache numbness weakness insomnia sleepiness too much sleep decreased energy joint pain aching muscles allergies urination problems incontinence impotence blood in stool diarrhea constipation.   ALLERGIES: Allergies  Allergen Reactions  . Asa [Aspirin] Shortness Of Breath  . Nsaids Other (See Comments)    Makes him bleed.    HOME MEDICATIONS: Outpatient Prescriptions Prior to Visit    Medication Sig Dispense Refill  . acetaminophen (TYLENOL) 500 MG tablet Take by mouth every 6 (six) hours as needed.    . budesonide-formoterol (SYMBICORT) 160-4.5 MCG/ACT inhaler Inhale 2 puffs into the lungs 2 (two) times daily. 3 Inhaler 1  . Cholecalciferol (VITAMIN D) 1000 UNITS capsule Take 3,000 Units by mouth daily.    . fluticasone (FLONASE) 50 MCG/ACT nasal spray Place 2 sprays into both nostrils daily. 16 g 5  . hydrOXYzine (ATARAX/VISTARIL) 10 MG tablet Take 2 tablets by mouth at bedtime.    . magnesium hydroxide (MILK OF MAGNESIA) 400 MG/5ML suspension Take 30 mLs by mouth daily.    . polyethylene glycol (MIRALAX / GLYCOLAX) packet Take 17 g by mouth daily as needed.    . solifenacin (VESICARE) 10 MG tablet Take 10 mg by mouth daily as needed.     . tiotropium (SPIRIVA) 18 MCG inhalation capsule Place 1 capsule (18 mcg total) into inhaler and inhale daily. 90 capsule 1  . methocarbamol (ROBAXIN) 500 MG tablet Take 1 tablet (500 mg total) by mouth every 6 (six) hours as needed for muscle spasms. 60 tablet 0  . oxyCODONE-acetaminophen (PERCOCET/ROXICET) 5-325 MG per tablet Take 1 tablet by mouth every four hours for moderate pain *DO NOT EXCEED 4 GM OF TYLENOL IN 24 HOURS*  Take 2 tablets by mouth every 4 hours for severe pain * DO NOT EXCEED 4GM OF TYLENOL IN 24 HOURS* 180 tablet 0   No facility-administered medications prior to visit.    PAST MEDICAL HISTORY: Past Medical History  Diagnosis Date  . COPD (chronic obstructive pulmonary disease)   . Shortness of breath   .  Asthma   . Arthritis   . GERD (gastroesophageal reflux disease)     no meds  . History of GI bleed   . HOH (hard of hearing)   . Full dentures   . Traumatic tear of lateral meniscus of right knee     right knee  . Medial meniscus, posterior horn derangement     left knee  . Diabetes mellitus without complication   . Headache     difficulty with headaches- 1950's , again in 1967, 2008- again problems  with migrraines   . Cancer     face- basal cell  . Hypertension   . Complication of anesthesia     after septoplasty and uvulectomy-was in icu- [facility] - 10 yrs. ago  . Stroke 1995    some speech and thought processes slow  . Sleep apnea     uses a cpap, most nights   . Pneumonia 2014    [facility]  . Skin cancer     PAST SURGICAL HISTORY: Past Surgical History  Procedure Laterality Date  . Foot foreign body removal  1976    left foot -multiple pieces glass  . Appendectomy  1963  . Hemorroidectomy  1995  . Carpal tunnel release      left  . Colonoscopy    . Upper gi endoscopy    . Nasal septum surgery  1995  . Uvulopalatopharyngoplasty (uppp)/tonsillectomy/septoplasty  1995    same time with septoplasty-ended up ICU almost trached  . Tonsillectomy    . Knee arthroscopy Bilateral 01/17/2013    Procedure: ARTHROSCOPY KNEE BILATERAL WITH MEDIAL AND LATERAL MENISECTOMIES;  Surgeon: Lorn Junes, MD;  Location: [place];  Service: Orthopedics;  Laterality: Bilateral;  . Eye surgery      foreign body removed fr. eye, ? side   . Lumbar laminectomy N/A 11/05/2014    Procedure: LEFT L3-4 MICRODISCECTOMY;  Surgeon: Jessy Oto, MD;  Location: [place];  Service: Orthopedics;  Laterality: N/A;  . Lumbar laminectomy/decompression microdiscectomy N/A 02/12/2015    Procedure: RE-DO LEFT L3-4 MICRODISCECTOMY;  Surgeon: Jessy Oto, MD;  Location: [place];  Service: Orthopedics;  Laterality: N/A;    FAMILY HISTORY: Family History  Problem Relation Age of Onset  . Cancer Mother   . Heart attack Father     SOCIAL HISTORY:  History   Social History  . Marital Status: Married    Spouse Name: Mardene Celeste  . Number of Children: 2  . Years of Education: 18   Occupational History  .      register of deeds   Social History Main Topics  . Smoking status: Former Smoker -- 1.50 packs/day for 40 years    Types: Cigarettes    Quit date: 01/14/1996  . Smokeless  tobacco: Never Used  . Alcohol Use: Yes     Comment: occ  . Drug Use: No  . Sexual Activity: Not on file   Other Topics Concern  . Not on file   Social History Narrative   Lives at home with wife, son   caffeine use - 2 cups coffee daily, sodas 1 a day, occas tea     PHYSICAL EXAM   GENERAL EXAM/CONSTITUTIONAL: Vitals:  Filed Vitals:   05/21/15 1036  BP: 109/65  Pulse: 68  Height: 6' (1.829 m)  Weight: 309 lb 3.2 oz (140.252 kg)     Body mass index is 41.93 kg/(m^2).  Visual Acuity Screening   Right eye Left eye  Both eyes  Without correction:     With correction: 20/30 20/30      Patient is in no distress; well developed, nourished and groomed; neck is supple  CARDIOVASCULAR:  Examination of carotid arteries is normal; no carotid bruits  Regular rate and rhythm, no murmurs  Examination of peripheral vascular system by observation and palpation is normal  EYES:  Ophthalmoscopic exam of optic discs and posterior segments is normal; no papilledema or hemorrhages  MUSCULOSKELETAL:  Gait, strength, tone, movements noted in Neurologic exam below  NEUROLOGIC: MENTAL STATUS:  No flowsheet data found.  awake, alert, oriented to person, place and time  recent and remote memory intact  normal attention and concentration  language fluent, comprehension intact, naming intact,   fund of knowledge appropriate  CRANIAL NERVE:   2nd - no papilledema on fundoscopic exam  2nd, 3rd, 4th, 6th - pupils equal and reactive to light, visual fields full to confrontation, extraocular muscles intact, no nystagmus  5th - facial sensation symmetric  7th - facial strength symmetric  8th - hearing intact  9th - palate elevates symmetrically, uvula midline  11th - shoulder shrug symmetric  12th - tongue protrusion midline  MOTOR:   normal bulk and tone, full strength in the BUE; BLE LIMITED BY PAIN (HF 4; KE/KF 5, DF 5)  SENSORY:   normal and symmetric to  light touch; DECR PP, TEMP AND VIB IN TOES/FEET IN GRADIENT UP TO KNEES  COORDINATION:   finger-nose-finger, fine finger movements --> MILD POSTURAL TREMOR  REFLEXES:   deep tendon reflexes TRACE IN BUE; ABSENT IN BLE  GAIT/STATION:   narrow based gait; ANTALGIC GAIT; USES ROLLING WALKER; ROMBERG POSITIVE     DIAGNOSTIC DATA (LABS, IMAGING, TESTING) - I reviewed patient records, labs, notes, testing and imaging myself where available.  Lab Results  Component Value Date   WBC 9.0 02/07/2015   HGB 14.4 02/07/2015   HCT 43.1 02/07/2015   MCV 91.7 02/07/2015   PLT 180 02/07/2015      Component Value Date/Time   NA 137 02/07/2015 1414   NA 138 10/28/2014 2234   K 4.1 02/07/2015 1414   K 3.9 10/28/2014 2234   CL 104 02/07/2015 1414   CL 100 10/28/2014 2234   CO2 21 02/07/2015 1414   CO2 28 10/28/2014 2234   GLUCOSE 127* 02/07/2015 1414   GLUCOSE 126* 10/28/2014 2234   BUN 9 02/07/2015 1414   BUN 22* 10/28/2014 2234   CREATININE 1.06 02/07/2015 1414   CREATININE 1.14 10/28/2014 2234   CALCIUM 9.2 02/07/2015 1414   CALCIUM 8.9 10/28/2014 2234   PROT 6.6 02/07/2015 1414   PROT 7.2 10/28/2014 2234   ALBUMIN 3.6 02/07/2015 1414   ALBUMIN 3.8 10/28/2014 2234   AST 35 02/07/2015 1414   AST 37 10/28/2014 2234   ALT 30 02/07/2015 1414   ALT 69* 10/28/2014 2234   ALKPHOS 55 02/07/2015 1414   ALKPHOS 69 10/28/2014 2234   BILITOT 0.8 02/07/2015 1414   GFRNONAA 68* 02/07/2015 1414   GFRNONAA >60 07/28/2013 0643   GFRAA 79* 02/07/2015 1414   GFRAA >60 07/28/2013 0643   No results found for: CHOL, HDL, LDLCALC, LDLDIRECT, TRIG, CHOLHDL Lab Results  Component Value Date   HGBA1C 6.9* 11/05/2014   No results found for: VITAMINB12 No results found for: TSH  01/14/15 MRI lumbar spine [I reviewed images myself and agree with interpretation. -VRP]  1. L3-L4 rightward and cephalad disc extrusion with suspected sequestered fragment 10 x  17 x 18 mm. Severe left lateral recess  and left L3 foraminal stenosis. 2. Postoperative changes on the left at L3-L4 since 10/29/2014, and otherwise stable lumbar spine.    ASSESSMENT AND PLAN  74 y.o. 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  Ddx: lumbar radiculopathy + neuropathy  PLAN: - neuropathy labs - continue PT and walker  Orders Placed This Encounter  Procedures  . Hemoglobin A1c  . Vitamin B12  . TSH   Return in about 3 months (around 08/21/2015).    Penni Bombard, MD 7/93/9688, 64:84 AM Certified in Neurology, Neurophysiology and Neuroimaging  [facility] [address] [place], [place] [address] [phone]

## 2015-05-22 LAB — TSH: TSH: 5.62 u[IU]/mL — AB (ref 0.450–4.500)

## 2015-05-22 LAB — HEMOGLOBIN A1C
Est. average glucose Bld gHb Est-mCnc: 131 mg/dL
Hgb A1c MFr Bld: 6.2 % — ABNORMAL HIGH (ref 4.8–5.6)

## 2015-05-22 LAB — VITAMIN B12: VITAMIN B 12: 624 pg/mL (ref 211–946)

## 2015-05-24 ENCOUNTER — Telehealth: Payer: Self-pay | Admitting: *Deleted

## 2015-05-24 NOTE — Telephone Encounter (Signed)
Spoke to the pt on the phone about his lab work. Informed him that his HgbA1C was improving and that his TSH was elevated and that he needed to follow up with his PCP about that. He requested that a copy of his labs be mailed to him so that he could take them to his PCP. He thanked me for calling

## 2015-05-29 ENCOUNTER — Other Ambulatory Visit: Payer: Self-pay | Admitting: Internal Medicine

## 2015-07-16 ENCOUNTER — Encounter: Payer: Self-pay | Admitting: *Deleted

## 2015-07-24 ENCOUNTER — Encounter: Payer: Self-pay | Admitting: Urology

## 2015-07-24 ENCOUNTER — Ambulatory Visit (INDEPENDENT_AMBULATORY_CARE_PROVIDER_SITE_OTHER): Payer: No Typology Code available for payment source | Admitting: Urology

## 2015-07-24 VITALS — BP 132/73 | HR 73 | Resp 18 | Ht 72.0 in | Wt 318.7 lb

## 2015-07-24 DIAGNOSIS — E291 Testicular hypofunction: Secondary | ICD-10-CM | POA: Insufficient documentation

## 2015-07-24 DIAGNOSIS — N138 Other obstructive and reflux uropathy: Secondary | ICD-10-CM | POA: Insufficient documentation

## 2015-07-24 DIAGNOSIS — N401 Enlarged prostate with lower urinary tract symptoms: Secondary | ICD-10-CM

## 2015-07-24 MED ORDER — TAMSULOSIN HCL 0.4 MG PO CAPS: 0.4000 mg | ORAL_CAPSULE | Freq: Every day | ORAL | Status: DC

## 2015-07-24 MED ORDER — TESTOSTERONE 20.25 MG/1.25GM (1.62%) TD GEL: 2.0000 | Freq: Every day | TRANSDERMAL | Status: DC

## 2015-07-24 MED ORDER — DUTASTERIDE 0.5 MG PO CAPS: 0.5000 mg | ORAL_CAPSULE | Freq: Every day | ORAL | Status: DC

## 2015-07-24 MED ORDER — SOLIFENACIN SUCCINATE 10 MG PO TABS: 10.0000 mg | ORAL_TABLET | Freq: Every day | ORAL | Status: DC | PRN

## 2015-07-24 NOTE — Progress Notes (Signed)
07/24/2015 8:29 PM   Eddie Carter 04/07/1941 [id number]  Referring provider: [name], MD [address] [name], [place] [address]  Chief Complaint  Patient presents with  . Follow-up    last OV 01/28/15  . Benign Prostatic Hypertrophy    PSA = 0.3 on 01/28/15  . Hypogonadism    HPI: Patient is a 74 year old white male with hypogonadism and BPH with LUTS who presents today for 6 month follow-up.  Hypogonadism Patient presented with the symptoms of reduced libido, erectile dysfunction and hot flashes.  He was also experiencing a reduced incidence of spontaneous erections, a reduced need to shave, a reduction in size of his testicles, an increase of body fat, a decrease in physical and work performance, disturbances in sleep patterns, decreased energy and motivation, a decrease in cognitive function and mood changes.   He is currently managing his hypogonadism with AndroGel 1.62% 2 pumps daily.  His most recent testosterone was 248 ng/dL on 01/28/2015.  BPH with LUTS His IPSS score today is 16, which is moderate lower urinary tract symptomatology. He is mixed with his quality life due to his urinary symptoms.   His major complaint today urgency.  He has had these symptoms for over 5 years.  He denies any dysuria, hematuria or suprapubic pain.   He currently taking tamsulosin, dutasteride and Vesicare.  His has had an urethral stricture that had been dilated in the remote past.     He also denies any recent fevers, chills, nausea or vomiting.  He does not have a family history of PCa.      IPSS      07/24/15 1400       International Prostate Symptom Score   How often have you had the sensation of not emptying your bladder? Not at All     How often have you had to urinate less than every two hours? Less than 1 in 5 times     How often have you found you stopped and started again several times when you urinated? About half the time     How often have you found it  difficult to postpone urination? Almost always     How often have you had a weak urinary stream? About half the time     How often have you had to strain to start urination? Less than half the time     How many times did you typically get up at night to urinate? 2 Times     Total IPSS Score 16     Quality of Life due to urinary symptoms   If you were to spend the rest of your life with your urinary condition just the way it is now how would you feel about that? Mixed        Score:  1-7 Mild 8-19 Moderate 20-35 Severe       PMH: Past Medical History  Diagnosis Date  . COPD (chronic obstructive pulmonary disease)   . Shortness of breath   . Asthma   . Arthritis   . GERD (gastroesophageal reflux disease)     no meds  . History of GI bleed   . HOH (hard of hearing)   . Full dentures   . Traumatic tear of lateral meniscus of right knee     right knee  . Medial meniscus, posterior horn derangement     left knee  . Diabetes mellitus without complication   . Headache  difficulty with headaches- 1950's , again in 1967, 2008- again problems with migrraines   . Cancer     face- basal cell  . Hypertension   . Complication of anesthesia     after septoplasty and uvulectomy-was in icu- [facility] - 10 yrs. ago  . Stroke 1995    some speech and thought processes slow  . Sleep apnea     uses a cpap, most nights   . Pneumonia 2014    [facility]  . Skin cancer   . Urinary incontinence   . Joint pain   . Hypogonadism in male   . BPH (benign prostatic hyperplasia)   . Obesity   . History of urethral stricture   . Urinary frequency     Surgical History: Past Surgical History  Procedure Laterality Date  . Foot foreign body removal  1976    left foot -multiple pieces glass  . Appendectomy  1963  . Hemorroidectomy  1995  . Carpal tunnel release      left  . Colonoscopy    . Upper gi endoscopy    . Nasal septum surgery  1995  . Uvulopalatopharyngoplasty  (uppp)/tonsillectomy/septoplasty  1995    same time with septoplasty-ended up ICU almost trached  . Tonsillectomy    . Knee arthroscopy Bilateral 01/17/2013    Procedure: ARTHROSCOPY KNEE BILATERAL WITH MEDIAL AND LATERAL MENISECTOMIES;  Surgeon: Lorn Junes, MD;  Location: [place];  Service: Orthopedics;  Laterality: Bilateral;  . Eye surgery      foreign body removed fr. eye, ? side   . Lumbar laminectomy N/A 11/05/2014    Procedure: LEFT L3-4 MICRODISCECTOMY;  Surgeon: Jessy Oto, MD;  Location: [place];  Service: Orthopedics;  Laterality: N/A;  . Lumbar laminectomy/decompression microdiscectomy N/A 02/12/2015    Procedure: RE-DO LEFT L3-4 MICRODISCECTOMY;  Surgeon: Jessy Oto, MD;  Location: [place];  Service: Orthopedics;  Laterality: N/A;    Home Medications:    Medication List       This list is accurate as of: 07/24/15  8:29 PM.  Always use your most recent med list.               acetaminophen 500 MG tablet  Commonly known as:  TYLENOL  Take by mouth every 6 (six) hours as needed.     budesonide-formoterol 160-4.5 MCG/ACT inhaler  Commonly known as:  SYMBICORT  Inhale 2 puffs into the lungs 2 (two) times daily.     clopidogrel 75 MG tablet  Commonly known as:  PLAVIX  Take 75 mg by mouth daily.     docusate sodium 100 MG capsule  Commonly known as:  COLACE  Take 100 mg by mouth 2 (two) times daily.     dutasteride 0.5 MG capsule  Commonly known as:  AVODART  Take 1 capsule (0.5 mg total) by mouth daily.     Fish Oil 1000 MG Caps  Take by mouth.     fluticasone 50 MCG/ACT nasal spray  Commonly known as:  FLONASE  Place 2 sprays into both nostrils daily.     HYDROcodone-acetaminophen 5-325 MG per tablet  Commonly known as:  NORCO/VICODIN  Take 1 tablet by mouth every 6 (six) hours as needed for moderate pain.     hydrOXYzine 10 MG tablet  Commonly known as:  ATARAX/VISTARIL  Take 2 tablets by mouth at bedtime.     magnesium  hydroxide 400 MG/5ML suspension  Commonly known as:  MILK OF  MAGNESIA  Take 30 mLs by mouth daily.     multivitamin capsule  Take 1 capsule by mouth daily.     polyethylene glycol packet  Commonly known as:  MIRALAX / GLYCOLAX  Take 17 g by mouth daily as needed.     solifenacin 10 MG tablet  Commonly known as:  VESICARE  Take 1 tablet (10 mg total) by mouth daily as needed.     tamsulosin 0.4 MG Caps capsule  Commonly known as:  FLOMAX  Take 1 capsule (0.4 mg total) by mouth daily after supper.     Testosterone 20.25 MG/1.25GM (1.62%) Gel  Commonly known as:  ANDROGEL  Apply 2 Act topically daily.     tiotropium 18 MCG inhalation capsule  Commonly known as:  SPIRIVA  Place 1 capsule (18 mcg total) into inhaler and inhale daily.     traMADol 50 MG tablet  Commonly known as:  ULTRAM  Take by mouth.     Vitamin D 1000 UNITS capsule  Take 3,000 Units by mouth daily.        Allergies:  Allergies  Allergen Reactions  . Asa [Aspirin] Shortness Of Breath  . Nsaids Other (See Comments)    Makes him bleed.    Family History: Family History  Problem Relation Age of Onset  . Cancer Mother   . Heart attack Father   . Heart disease    . Arthritis    . Prostate cancer Neg Hx     Social History:  reports that he quit smoking about 19 years ago. His smoking use included Cigarettes. He has a 60 pack-year smoking history. He has never used smokeless tobacco. He reports that he drinks alcohol. He reports that he does not use illicit drugs.  ROS: UROLOGY Frequent Urination?: No Hard to postpone urination?: Yes Burning/pain with urination?: No Get up at night to urinate?: Yes Leakage of urine?: Yes Urine stream starts and stops?: Yes Trouble starting stream?: No Do you have to strain to urinate?: No Blood in urine?: No Urinary tract infection?: No Sexually transmitted disease?: No Injury to kidneys or bladder?: No Painful intercourse?: No Weak stream?: No Erection  problems?: Yes Penile pain?: No  Gastrointestinal Nausea?: No Vomiting?: No Indigestion/heartburn?: No Diarrhea?: Yes Constipation?: Yes  Constitutional Fever: No Night sweats?: No Weight loss?: No Fatigue?: Yes  Skin Skin rash/lesions?: No Itching?: Yes  Eyes Blurred vision?: Yes Double vision?: No  Ears/Nose/Throat Sore throat?: No Sinus problems?: No  Hematologic/Lymphatic Swollen glands?: No Easy bruising?: No  Cardiovascular Leg swelling?: No Chest pain?: No  Respiratory Cough?: Yes Shortness of breath?: Yes  Endocrine Excessive thirst?: No  Musculoskeletal Back pain?: Yes Joint pain?: Yes  Neurological Headaches?: Yes Dizziness?: No  Psychologic Depression?: No Anxiety?: No  Physical Exam: BP 132/73 mmHg  Pulse 73  Resp 18  Ht 6' (1.829 m)  Wt 318 lb 11.2 oz (144.561 kg)  BMI 43.21 kg/m2  GU: Patient with uncircumcised phallus. Foreskin easily retracted  Urethral meatus is patent.  No penile discharge. No penile lesions or rashes. Scrotum without lesions, cysts, rashes and/or edema.  Testicles are located scrotally bilaterally. No masses are appreciated in the testicles. Left and right epididymis are normal. Rectal: Patient with  normal sphincter tone. Perineum without scarring or rashes. No rectal masses are appreciated. Prostate is approximately 35 grams, no nodules are appreciated. Seminal vesicles are normal.   Laboratory Data: Lab Results  Component Value Date   WBC 9.0 02/07/2015   HGB 14.4  02/07/2015   HCT 43.1 02/07/2015   MCV 91.7 02/07/2015   PLT 180 02/07/2015    Lab Results  Component Value Date   CREATININE 1.06 02/07/2015    PSA history:  Less than 0.1 ng/mL on 04/04/2014  Less than 0.1 ng/mL on 10/04/2014  0.3 ng/dL on 01/28/2015    Lab Results  Component Value Date   HGBA1C 6.2* 05/21/2015     Assessment & Plan:    1. BPH (benign prostatic hyperplasia) with LUTS:   IPS S score is 16/3. He will  continue the dutasteride, tamsulosin and Vesicare. A prescription for these 3 medications are sent to his pharmacist. He will return in 6 months for a PSA, DRE and I PSS score.  - PSA  2. Hypogonadism:    Patient will continue the AndroGel 1.62% 2 pumps daily.  He will return in 6 months for an a.m. serum testosterone draw and an ADAM questionnaire.  -Testosterone   Return in about 6 months (around 01/21/2016) for IPSS, ADAM.  Zara Council, [place] [address] [place], [place] [address] [phone]

## 2015-07-25 LAB — PSA: PROSTATE SPECIFIC AG, SERUM: 0.3 ng/mL (ref 0.0–4.0)

## 2015-07-26 ENCOUNTER — Telehealth: Payer: Self-pay

## 2015-07-26 LAB — TESTOSTERONE

## 2015-07-26 NOTE — Telephone Encounter (Signed)
-----   Message from Nori Riis, PA-C sent at 07/25/2015  8:02 PM EDT ----- Patient's testosterone is too high.  He needs to stop his testosterone.  Please call the pharmacy and cancel his prescription.

## 2015-07-26 NOTE — Telephone Encounter (Signed)
Spoke with pt and made aware to stop testosterone. Pt voiced frustration. Reinforced with pt levels are too high. Pt voiced understanding. Script at [facility] was cancelled.

## 2015-08-22 ENCOUNTER — Encounter: Payer: Self-pay | Admitting: Diagnostic Neuroimaging

## 2015-08-22 ENCOUNTER — Ambulatory Visit (INDEPENDENT_AMBULATORY_CARE_PROVIDER_SITE_OTHER): Payer: PRIVATE HEALTH INSURANCE | Admitting: Diagnostic Neuroimaging

## 2015-08-22 VITALS — BP 119/67 | HR 74 | Ht 72.0 in | Wt 323.0 lb

## 2015-08-22 DIAGNOSIS — M5416 Radiculopathy, lumbar region: Secondary | ICD-10-CM

## 2015-08-22 DIAGNOSIS — E1142 Type 2 diabetes mellitus with diabetic polyneuropathy: Secondary | ICD-10-CM | POA: Diagnosis not present

## 2015-08-22 NOTE — Patient Instructions (Signed)

## 2015-08-22 NOTE — Progress Notes (Signed)
[facility]  PATIENT: Eddie Carter DOB: [birth year]  REFERRING CLINICIAN: Louanne Skye HISTORY FROM: patient  REASON FOR VISIT: follow up     HISTORICAL  CHIEF COMPLAINT:  Chief Complaint  Patient presents with  . Lumbar radiculopathy    rm 6  . Follow-up    3 month    HISTORY OF PRESENT ILLNESS:   UPDATE 08/22/15: Since last visit, symptoms continue. Still with back pain, lower ext pain, numbness, memory loss, decreased family support, arthritis pain.   PRIOR HPI (05/21/15): 74 year old male with migraine, skin cancer, diabetes, hypertension, stroke, COPD, here for evaluation of balance and gait difficulty. September 2015 patient was injured when he was working in his office, book case fell over and he caught it to prevent it from falling down. He felt a pop sensation in his left lower back radiating down his left leg. He was evaluated and diagnosed with disc herniation and treated with decompression in December 2015 with improvement in symptoms. January 2016 patient slipped on ice and had recurrent pain, and bilateral legs, found to have recurrent disc herniation. He is treated with surgery and fiber 2016 with some improvement symptoms. Since that time he has not been able to return to his prior level of functioning. He has generalized deconditioning, weakness, pain, as well as fear of falling and balance difficulties. Patient referred to me for consideration of alternate explanation of balance and gait difficulties besides his lumbar spine disease and lumbar radiculopathy.   REVIEW OF SYSTEMS: Full 14 system review of systems performed and notable only for fatigue hearing loss itching blurred vision short of breath cough wheezing memory loss headache numbness weakness insomnia sleepiness too much sleep decreased energy joint pain aching muscles allergies urination problems incontinence impotence blood in stool diarrhea constipation.   ALLERGIES: Allergies  Allergen  Reactions  . Asa [Aspirin] Shortness Of Breath  . Nsaids Other (See Comments)    Makes him bleed.    HOME MEDICATIONS: Outpatient Prescriptions Prior to Visit  Medication Sig Dispense Refill  . acetaminophen (TYLENOL) 500 MG tablet Take by mouth every 6 (six) hours as needed.    . budesonide-formoterol (SYMBICORT) 160-4.5 MCG/ACT inhaler Inhale 2 puffs into the lungs 2 (two) times daily. 3 Inhaler 1  . Cholecalciferol (VITAMIN D) 1000 UNITS capsule Take 3,000 Units by mouth daily.    . clopidogrel (PLAVIX) 75 MG tablet Take 75 mg by mouth daily.    Marland Kitchen docusate sodium (COLACE) 100 MG capsule Take 100 mg by mouth 2 (two) times daily.    Marland Kitchen dutasteride (AVODART) 0.5 MG capsule Take 1 capsule (0.5 mg total) by mouth daily. 90 capsule 12  . fluticasone (FLONASE) 50 MCG/ACT nasal spray Place 2 sprays into both nostrils daily. 16 g 5  . HYDROcodone-acetaminophen (NORCO/VICODIN) 5-325 MG per tablet Take 1 tablet by mouth every 6 (six) hours as needed for moderate pain.    . hydrOXYzine (ATARAX/VISTARIL) 10 MG tablet Take 2 tablets by mouth at bedtime.    . magnesium hydroxide (MILK OF MAGNESIA) 400 MG/5ML suspension Take 30 mLs by mouth daily.    . Multiple Vitamin (MULTIVITAMIN) capsule Take 1 capsule by mouth daily.    . Omega-3 Fatty Acids (FISH OIL) 1000 MG CAPS Take by mouth.    . polyethylene glycol (MIRALAX / GLYCOLAX) packet Take 17 g by mouth daily as needed.    . solifenacin (VESICARE) 10 MG tablet Take 1 tablet (10 mg total) by mouth daily as needed. 90 tablet 12  .  tamsulosin (FLOMAX) 0.4 MG CAPS capsule Take 1 capsule (0.4 mg total) by mouth daily after supper. 90 capsule 12  . tiotropium (SPIRIVA) 18 MCG inhalation capsule Place 1 capsule (18 mcg total) into inhaler and inhale daily. 90 capsule 1  . traMADol (ULTRAM) 50 MG tablet Take by mouth.    . Testosterone (ANDROGEL) 20.25 MG/1.25GM (1.62%) GEL Apply 2 Act topically daily. 150 g 5   No facility-administered medications prior to  visit.    PAST MEDICAL HISTORY: Past Medical History  Diagnosis Date  . COPD (chronic obstructive pulmonary disease)   . Shortness of breath   . Asthma   . Arthritis   . GERD (gastroesophageal reflux disease)     no meds  . History of GI bleed   . HOH (hard of hearing)   . Full dentures   . Traumatic tear of lateral meniscus of right knee     right knee  . Medial meniscus, posterior horn derangement     left knee  . Diabetes mellitus without complication   . Headache     difficulty with headaches- 1950's , again in 1967, 2008- again problems with migrraines   . Cancer     face- basal cell  . Hypertension   . Complication of anesthesia     after septoplasty and uvulectomy-was in icu- [facility] - 10 yrs. ago  . Stroke 1995    some speech and thought processes slow  . Sleep apnea     uses a cpap, most nights   . Pneumonia 2014    [facility]  . Skin cancer   . Urinary incontinence   . Joint pain   . Hypogonadism in male   . BPH (benign prostatic hyperplasia)   . Obesity   . History of urethral stricture   . Urinary frequency     PAST SURGICAL HISTORY: Past Surgical History  Procedure Laterality Date  . Foot foreign body removal  1976    left foot -multiple pieces glass  . Appendectomy  1963  . Hemorroidectomy  1995  . Carpal tunnel release      left  . Colonoscopy    . Upper gi endoscopy    . Nasal septum surgery  1995  . Uvulopalatopharyngoplasty (uppp)/tonsillectomy/septoplasty  1995    same time with septoplasty-ended up ICU almost trached  . Tonsillectomy    . Knee arthroscopy Bilateral 01/17/2013    Procedure: ARTHROSCOPY KNEE BILATERAL WITH MEDIAL AND LATERAL MENISECTOMIES;  Surgeon: Lorn Junes, MD;  Location: [place];  Service: Orthopedics;  Laterality: Bilateral;  . Eye surgery      foreign body removed fr. eye, ? side   . Lumbar laminectomy N/A 11/05/2014    Procedure: LEFT L3-4 MICRODISCECTOMY;  Surgeon: Jessy Oto, MD;   Location: [facility];  Service: Orthopedics;  Laterality: N/A;  . Lumbar laminectomy/decompression microdiscectomy N/A 02/12/2015    Procedure: RE-DO LEFT L3-4 MICRODISCECTOMY;  Surgeon: Jessy Oto, MD;  Location: [place];  Service: Orthopedics;  Laterality: N/A;    FAMILY HISTORY: Family History  Problem Relation Age of Onset  . Cancer Mother   . Heart attack Father   . Heart disease    . Arthritis    . Prostate cancer Neg Hx     SOCIAL HISTORY:  Social History   Social History  . Marital Status: Married    Spouse Name: Mardene Celeste  . Number of Children: 2  . Years of Education: 18   Occupational  History  .      register of deeds   Social History Main Topics  . Smoking status: Former Smoker -- 1.50 packs/day for 40 years    Types: Cigarettes    Quit date: 01/14/1996  . Smokeless tobacco: Never Used  . Alcohol Use: Yes     Comment: occ  . Drug Use: No  . Sexual Activity: Not on file   Other Topics Concern  . Not on file   Social History Narrative   Lives at home with wife, son   caffeine use - 2 cups coffee daily, sodas 1 a day, occas tea     PHYSICAL EXAM   GENERAL EXAM/CONSTITUTIONAL: Vitals:  Filed Vitals:   08/22/15 1640  BP: 119/67  Pulse: 74  Height: 6' (1.829 m)  Weight: 323 lb (146.512 kg)   Body mass index is 43.8 kg/(m^2). No exam data present  Patient is in no distress; well developed, nourished and groomed; neck is supple  CARDIOVASCULAR:  Examination of carotid arteries is normal; no carotid bruits  Regular rate and rhythm, no murmurs  Examination of peripheral vascular system by observation and palpation is normal  EYES:  Ophthalmoscopic exam of optic discs and posterior segments is normal; no papilledema or hemorrhages  MUSCULOSKELETAL:  Gait, strength, tone, movements noted in Neurologic exam below  NEUROLOGIC: MENTAL STATUS:  No flowsheet data found.  awake, alert, oriented to person, place and time  recent and remote  memory intact  normal attention and concentration  language fluent, comprehension intact, naming intact,   fund of knowledge appropriate  CRANIAL NERVE:   2nd - no papilledema on fundoscopic exam  2nd, 3rd, 4th, 6th - pupils equal and reactive to light, visual fields full to confrontation, extraocular muscles intact, no nystagmus  5th - facial sensation symmetric  7th - facial strength symmetric  8th - hearing intact  9th - palate elevates symmetrically, uvula midline  11th - shoulder shrug symmetric  12th - tongue protrusion midline  MOTOR:   normal bulk and tone, full strength in the BUE; BLE LIMITED BY PAIN (HF 4; KE/KF 5, DF 5)  SENSORY:   normal and symmetric to light touch; DECR PP, TEMP AND VIB IN TOES/FEET IN GRADIENT UP TO KNEES  COORDINATION:   finger-nose-finger, fine finger movements --> MILD POSTURAL TREMOR  REFLEXES:   deep tendon reflexes TRACE IN BUE; ABSENT IN BLE  GAIT/STATION:   narrow based gait; ANTALGIC GAIT; USES ROLLING WALKER; ROMBERG POSITIVE     DIAGNOSTIC DATA (LABS, IMAGING, TESTING) - I reviewed patient records, labs, notes, testing and imaging myself where available.  Lab Results  Component Value Date   WBC 9.0 02/07/2015   HGB 14.4 02/07/2015   HCT 43.1 02/07/2015   MCV 91.7 02/07/2015   PLT 180 02/07/2015      Component Value Date/Time   NA 137 02/07/2015 1414   NA 138 10/28/2014 2234   K 4.1 02/07/2015 1414   K 3.9 10/28/2014 2234   CL 104 02/07/2015 1414   CL 100 10/28/2014 2234   CO2 21 02/07/2015 1414   CO2 28 10/28/2014 2234   GLUCOSE 127* 02/07/2015 1414   GLUCOSE 126* 10/28/2014 2234   BUN 9 02/07/2015 1414   BUN 22* 10/28/2014 2234   CREATININE 1.06 02/07/2015 1414   CREATININE 1.14 10/28/2014 2234   CALCIUM 9.2 02/07/2015 1414   CALCIUM 8.9 10/28/2014 2234   PROT 6.6 02/07/2015 1414   PROT 7.2 10/28/2014 2234   ALBUMIN 3.6  02/07/2015 1414   ALBUMIN 3.8 10/28/2014 2234   AST 35 02/07/2015 1414     AST 37 10/28/2014 2234   ALT 30 02/07/2015 1414   ALT 69* 10/28/2014 2234   ALKPHOS 55 02/07/2015 1414   ALKPHOS 69 10/28/2014 2234   BILITOT 0.8 02/07/2015 1414   BILITOT 0.7 10/28/2014 2234   GFRNONAA 68* 02/07/2015 1414   GFRNONAA >60 10/28/2014 2234   GFRNONAA >60 07/28/2013 0643   GFRAA 79* 02/07/2015 1414   GFRAA >60 10/28/2014 2234   GFRAA >60 07/28/2013 0643   Lab Results  Component Value Date   CHOL 159 10/30/2014   HDL 32* 10/30/2014   LDLCALC 98 10/30/2014   TRIG 147 10/30/2014   Lab Results  Component Value Date   HGBA1C 6.2* 05/21/2015   Lab Results  Component Value Date   YNWGNFAO13 086 05/21/2015   Lab Results  Component Value Date   TSH 5.620* 05/21/2015    01/14/15 MRI lumbar spine [I reviewed images myself and agree with interpretation. -VRP]  1. L3-L4 rightward and cephalad disc extrusion with suspected sequestered fragment 10 x 17 x 18 mm. 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 2. Postoperative changes on the left at L3-L4 since 10/29/2014, and otherwise stable lumbar spine.    ASSESSMENT AND PLAN  74 y.o. 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  Ddx: lumbar radiculopathy + neuropathy  PLAN: - continue PT and walker - follow up with PCP re: diabetes and hypothyroidism  Return if symptoms worsen or fail to improve, for return to PCP.    Penni Bombard, MD 5/78/4696, 2:95 PM Certified in Neurology, Neurophysiology and Neuroimaging  [facility] [address] [place], [place] [address] [phone]

## 2016-01-02 ENCOUNTER — Ambulatory Visit: Payer: Self-pay | Admitting: Physician Assistant

## 2016-01-02 DIAGNOSIS — Z299 Encounter for prophylactic measures, unspecified: Secondary | ICD-10-CM

## 2016-01-02 NOTE — Progress Notes (Signed)
Patient ID: Eddie Carter, male   DOB: 1941-09-28, 75 y.o.   MRN: [id number] Patient came in to have a flu shot done.

## 2016-01-15 ENCOUNTER — Other Ambulatory Visit: Payer: Self-pay

## 2016-01-15 DIAGNOSIS — Z79899 Other long term (current) drug therapy: Secondary | ICD-10-CM

## 2016-01-16 ENCOUNTER — Other Ambulatory Visit: Payer: Medicare Other

## 2016-01-16 DIAGNOSIS — Z79899 Other long term (current) drug therapy: Secondary | ICD-10-CM

## 2016-01-17 LAB — TESTOSTERONE: Testosterone: 194 ng/dL — ABNORMAL LOW (ref 348–1197)

## 2016-01-17 LAB — HEPATIC FUNCTION PANEL
ALBUMIN: 3.9 g/dL (ref 3.5–4.8)
ALK PHOS: 57 IU/L (ref 39–117)
ALT: 34 IU/L (ref 0–44)
AST: 30 IU/L (ref 0–40)
BILIRUBIN TOTAL: 0.3 mg/dL (ref 0.0–1.2)
BILIRUBIN, DIRECT: 0.11 mg/dL (ref 0.00–0.40)
Total Protein: 6.4 g/dL (ref 6.0–8.5)

## 2016-01-17 LAB — LIPID PANEL
CHOL/HDL RATIO: 7.2 ratio — AB (ref 0.0–5.0)
Cholesterol, Total: 187 mg/dL (ref 100–199)
HDL: 26 mg/dL — ABNORMAL LOW (ref >39)
LDL Calculated: 110 mg/dL — ABNORMAL HIGH (ref 0–99)
Triglycerides: 253 mg/dL — ABNORMAL HIGH (ref 0–149)
VLDL Cholesterol Cal: 51 mg/dL — ABNORMAL HIGH (ref 5–40)

## 2016-01-17 LAB — PROLACTIN: PROLACTIN: 13 ng/mL (ref 4.0–15.2)

## 2016-01-17 LAB — HEMATOCRIT: Hematocrit: 44.1 % (ref 37.5–51.0)

## 2016-01-17 LAB — TSH: TSH: 6.43 u[IU]/mL — AB (ref 0.450–4.500)

## 2016-01-17 LAB — ESTRADIOL: Estradiol: 40 pg/mL (ref 7.6–42.6)

## 2016-01-17 LAB — PSA: PROSTATE SPECIFIC AG, SERUM: 0.1 ng/mL (ref 0.0–4.0)

## 2016-01-21 ENCOUNTER — Encounter: Payer: Self-pay | Admitting: Urology

## 2016-01-21 ENCOUNTER — Ambulatory Visit (INDEPENDENT_AMBULATORY_CARE_PROVIDER_SITE_OTHER): Payer: Managed Care, Other (non HMO) | Admitting: Urology

## 2016-01-21 VITALS — BP 147/63 | HR 83 | Ht 72.0 in | Wt 328.5 lb

## 2016-01-21 DIAGNOSIS — N528 Other male erectile dysfunction: Secondary | ICD-10-CM

## 2016-01-21 DIAGNOSIS — N138 Other obstructive and reflux uropathy: Secondary | ICD-10-CM

## 2016-01-21 DIAGNOSIS — N401 Enlarged prostate with lower urinary tract symptoms: Secondary | ICD-10-CM

## 2016-01-21 DIAGNOSIS — E291 Testicular hypofunction: Secondary | ICD-10-CM

## 2016-01-21 DIAGNOSIS — N529 Male erectile dysfunction, unspecified: Secondary | ICD-10-CM

## 2016-01-21 NOTE — Progress Notes (Signed)
4:59 PM   Eddie Carter [birth year] [id number]  Referring provider: [name], MD [address] [place], [place] [address]  Chief Complaint  Patient presents with  . Benign Prostatic Hypertrophy    6 month follow up  . Hypogonadism    HPI: Patient is a 75 year old white male with hypogonadism, erectile dysfunction and BPH with LUTS who presents today for 6 month follow-up.  Hypogonadism Patient is experiencing a decrease in libido, a lack of energy, a decrease in strength, a decreased enjoyment in life, sadness and/or grumpiness, erections being less strong and falling asleep after dinner.  This is indicated by his responses to the ADAM questionnaire.  He is not currently applying his AndroGel after his testosterone levels returned >1500 ng/dL on 06/2015.  His current testosterone level is 194 ng/dL on 01/16/2016.          Androgen Deficiency in the Aging Male      01/21/16 1600       Androgen Deficiency in the Aging Male   Do you have a decrease in libido (sex drive) Yes     Do you have lack of energy Yes     Do you have a decrease in strength and/or endurance Yes     Have you lost height No     Have you noticed a decreased "enjoyment of life" Yes     Are you sad and/or grumpy Yes     Are your erections less strong Yes     Have you noticed a recent deterioration in your ability to play sports No     Are you falling asleep after dinner Yes     Has there been a recent deterioration in your work performance No       BPH with LUTS His IPSS score today is 21, which is severe lower urinary tract symptomatology. He is unhappy with his quality life due to his urinary symptoms.    His major complaint today is having to sit to urinate.  He states he has to sit to urinate due to his weight.  He has had these symptoms for over 5 years.  He denies any dysuria, hematuria or suprapubic pain.   He currently taking tamsulosin, dutasteride and Vesicare.  His has had an urethral  stricture that had been dilated in the remote past.  He also denies any recent fevers, chills, nausea or vomiting.  He does not have a family history of PCa.      IPSS      01/21/16 1600       International Prostate Symptom Score   How often have you had the sensation of not emptying your bladder? About half the time     How often have you had to urinate less than every two hours? Less than half the time     How often have you found you stopped and started again several times when you urinated? About half the time     How often have you found it difficult to postpone urination? Almost always     How often have you had a weak urinary stream? More than half the time     How often have you had to strain to start urination? Less than half the time     How many times did you typically get up at night to urinate? 2 Times     Total IPSS Score 21     Quality of Life due to urinary symptoms  If you were to spend the rest of your life with your urinary condition just the way it is now how would you feel about that? Unhappy        Score:  1-7 Mild 8-19 Moderate 20-35 Severe   Erectile dysfunction His SHIM score is 5, which is severe.   He has been having difficulty with erections for over five years.   His major complaint is achieving an erection and his wife is not interested in sexually activity.  He stated that she was grumpy and mean to him.  She was not physically abusive.  His libido is diminished.   His risk factors for ED are obesity, BPH, age, HLD, COPD, hypogonadism and sleep apnea .  He denies any painful erections or curvatures with his erections.   He has tried PDE5-inhibitors in the past.        SHIM      01/21/16 1619       SHIM: Over the last 6 months:   How do you rate your confidence that you could get and keep an erection? Very Low     When you had erections with sexual stimulation, how often were your erections hard enough for penetration (entering your partner)?  Almost Never or Never     During sexual intercourse, how often were you able to maintain your erection after you had penetrated (entered) your partner? Extremely Difficult     During sexual intercourse, how difficult was it to maintain your erection to completion of intercourse? Extremely Difficult     When you attempted sexual intercourse, how often was it satisfactory for you? Extremely Difficult     SHIM Total Score   SHIM 5        Score: 1-7 Severe ED 8-11 Moderate ED 12-16 Mild-Moderate ED 17-21 Mild ED 22-25 No ED  PMH: Past Medical History  Diagnosis Date  . COPD (chronic obstructive pulmonary disease) ([place])   . Shortness of breath   . Asthma   . Arthritis   . GERD (gastroesophageal reflux disease)     no meds  . History of GI bleed   . HOH (hard of hearing)   . Full dentures   . Traumatic tear of lateral meniscus of right knee     right knee  . Medial meniscus, posterior horn derangement     left knee  . Diabetes mellitus without complication ([place])   . Headache     difficulty with headaches- 1950's , again in 1967, 2008- again problems with migrraines   . 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 Hypertension   . Complication of anesthesia     after septoplasty and uvulectomy-was in icu- [facility] - 10 yrs. ago  . Stroke [facility]) 1995    some speech and thought processes slow  . Sleep apnea     uses a cpap, most nights   . Pneumonia 2014    [facility]  . Skin cancer   . Urinary incontinence   . Joint pain   . Hypogonadism in male   . BPH (benign prostatic hyperplasia)   . Obesity   . History of urethral stricture   . Urinary frequency     Surgical History: Past Surgical History  Procedure Laterality Date  . Foot foreign body removal  1976    left foot -multiple pieces glass  . Appendectomy  1963  . Hemorroidectomy  1995  . Carpal tunnel release  left  . Colonoscopy    . Upper gi endoscopy    . Nasal septum surgery  1995  .  Uvulopalatopharyngoplasty (uppp)/tonsillectomy/septoplasty  1995    same time with septoplasty-ended up ICU almost trached  . Tonsillectomy    . Knee arthroscopy Bilateral 01/17/2013    Procedure: ARTHROSCOPY KNEE BILATERAL WITH MEDIAL AND LATERAL MENISECTOMIES;  Surgeon: Lorn Junes, MD;  Location: [place];  Service: Orthopedics;  Laterality: Bilateral;  . Eye surgery      foreign body removed fr. eye, ? side   . Lumbar laminectomy N/A 11/05/2014    Procedure: LEFT L3-4 MICRODISCECTOMY;  Surgeon: Jessy Oto, MD;  Location: [place];  Service: Orthopedics;  Laterality: N/A;  . Lumbar laminectomy/decompression microdiscectomy N/A 02/12/2015    Procedure: RE-DO LEFT L3-4 MICRODISCECTOMY;  Surgeon: Jessy Oto, MD;  Location: [place];  Service: Orthopedics;  Laterality: N/A;    Home Medications:    Medication List       This list is accurate as of: 01/21/16  4:59 PM.  Always use your most recent med list.               acetaminophen 500 MG tablet  Commonly known as:  TYLENOL  Take by mouth every 6 (six) hours as needed.     budesonide-formoterol 160-4.5 MCG/ACT inhaler  Commonly known as:  SYMBICORT  Inhale 2 puffs into the lungs 2 (two) times daily.     clopidogrel 75 MG tablet  Commonly known as:  PLAVIX  Take 75 mg by mouth daily.     docusate sodium 100 MG capsule  Commonly known as:  COLACE  Take 100 mg by mouth 2 (two) times daily. Reported on 01/21/2016     dutasteride 0.5 MG capsule  Commonly known as:  AVODART  Take 1 capsule (0.5 mg total) by mouth daily.     Fish Oil 1000 MG Caps  Take by mouth.     fluticasone 50 MCG/ACT nasal spray  Commonly known as:  FLONASE  Place 2 sprays into both nostrils daily.     HYDROcodone-acetaminophen 5-325 MG tablet  Commonly known as:  NORCO/VICODIN  Take 1 tablet by mouth every 6 (six) hours as needed for moderate pain.     hydrOXYzine 10 MG tablet  Commonly known as:  ATARAX/VISTARIL  Take 2 tablets  by mouth at bedtime.     LEVEMIR FLEXTOUCH 100 UNIT/ML Pen  Generic drug:  Insulin Detemir  Reported on 01/21/2016     magnesium hydroxide 400 MG/5ML suspension  Commonly known as:  MILK OF MAGNESIA  Take 30 mLs by mouth daily.     metFORMIN 500 MG tablet  Commonly known as:  GLUCOPHAGE  Reported on 01/21/2016     multivitamin capsule  Take 1 capsule by mouth daily.     polyethylene glycol packet  Commonly known as:  MIRALAX / GLYCOLAX  Take 17 g by mouth daily as needed. Reported on 01/21/2016     RA KRILL OIL 500 MG Caps  Take by mouth.     solifenacin 10 MG tablet  Commonly known as:  VESICARE  Take 1 tablet (10 mg total) by mouth daily as needed.     tamsulosin 0.4 MG Caps capsule  Commonly known as:  FLOMAX  Take 1 capsule (0.4 mg total) by mouth daily after supper.     tiotropium 18 MCG inhalation capsule  Commonly known as:  SPIRIVA  Place 1 capsule (18 mcg total)  into inhaler and inhale daily.     traMADol 50 MG tablet  Commonly known as:  ULTRAM  Take by mouth. Reported on 01/21/2016     Vitamin D 1000 units capsule  Take 3,000 Units by mouth daily.        Allergies:  Allergies  Allergen Reactions  . Asa [Aspirin] Shortness Of Breath  . Nsaids Other (See Comments)    Makes him bleed.    Family History: Family History  Problem Relation Age of Onset  . Cancer Mother   . Heart attack Father   . Heart disease    . Arthritis    . Prostate cancer Neg Hx   . Kidney disease Neg Hx     Social History:  reports that he quit smoking about 20 years ago. His smoking use included Cigarettes. He has a 60 pack-year smoking history. He has never used smokeless tobacco. He reports that he drinks alcohol. He reports that he does not use illicit drugs.  ROS: UROLOGY Frequent Urination?: Yes Hard to postpone urination?: Yes Burning/pain with urination?: No Get up at night to urinate?: Yes Leakage of urine?: Yes Urine stream starts and stops?: Yes Trouble  starting stream?: No Do you have to strain to urinate?: No Blood in urine?: No Urinary tract infection?: No Sexually transmitted disease?: No Injury to kidneys or bladder?: No Painful intercourse?: No Weak stream?: No Erection problems?: Yes Penile pain?: No  Gastrointestinal Nausea?: No Vomiting?: No Indigestion/heartburn?: No Diarrhea?: Yes Constipation?: Yes  Constitutional Fever: No Night sweats?: No Weight loss?: No Fatigue?: No  Skin Skin rash/lesions?: No Itching?: Yes  Eyes Blurred vision?: Yes Double vision?: No  Ears/Nose/Throat Sore throat?: No Sinus problems?: No  Hematologic/Lymphatic Swollen glands?: No Easy bruising?: No  Cardiovascular Leg swelling?: No Chest pain?: No  Respiratory Cough?: No Shortness of breath?: Yes  Endocrine Excessive thirst?: No  Musculoskeletal Back pain?: Yes Joint pain?: Yes  Neurological Headaches?: Yes Dizziness?: No  Psychologic Depression?: No Anxiety?: No  Physical Exam: BP 147/63 mmHg  Pulse 83  Ht 6' (1.829 m)  Wt 328 lb 8 oz (149.007 kg)  BMI 44.54 kg/m2  GU: Patient with uncircumcised phallus. Foreskin easily retracted  Urethral meatus is patent.  No penile discharge. No penile lesions or rashes. Scrotum without lesions, cysts, rashes and/or edema.  Testicles are located scrotally bilaterally. No masses are appreciated in the testicles. Left and right epididymis are normal. Rectal: Patient with  normal sphincter tone. Perineum without scarring or rashes. No rectal masses are appreciated. Prostate is approximately 35 grams, no nodules are appreciated. Seminal vesicles are normal.   Laboratory Data: Lab Results  Component Value Date   WBC 9.0 02/07/2015   HGB 14.4 02/07/2015   HCT 44.1 01/16/2016   MCV 91.7 02/07/2015   PLT 180 02/07/2015    Lab Results  Component Value Date   CREATININE 1.06 02/07/2015    PSA history:  Less than 0.1 ng/mL on 04/04/2014  Less than 0.1 ng/mL on  10/04/2014  0.3 ng/mL on 01/28/2015  0.1 ng/mL on 01/16/2016   Lab Results  Component Value Date   HGBA1C 6.2* 05/21/2015     Assessment & Plan:    1. BPH (benign prostatic hyperplasia) with LUTS:   IPS S score is 21/5.  He is aware that his obesity is contributing to the need for him to sit to urinate in order not to miss the toilet bowl.  He will continue the dutasteride, tamsulosin and Vesicare.  He  will return in 6 months for a PSA, DRE and I PSS score.  2. Hypogonadism:    Patient's lipids indicate that he is above average risk for heart disease. His TSH is also elevated.  He will have an appointment with his primary care physician to assess these issues.  I have advised the patient that we should not initiating testosterone therapy at this time until these lab values are addressed and corrected.  I stated testosterone therapy has been shown to put men at risk for heart disease in some studies. I also explained that an elevated TSH may indicate hypothyroidism which has some of this similar symptoms as hypogonadism.  3. Erectile dysfunction:  SHIM score is 5.  He is not interested in pursuing treatment for his erectile dysfunction at this time as he feels his wife is not interested in sexual activity.  He states his wife will not seek evaluation for her lack of interest in sex and I stated that I would be happy to see her as a patient as I have seen her in the remote past as a patient.  He will discuss this with his wife.     Return for patient needs to see Dr. Lavera Guise for elevated lipids and elevated TSH.  Zara Council, [facility] [address], [place],  [address] [phone]

## 2016-01-22 ENCOUNTER — Telehealth: Payer: Self-pay | Admitting: Urology

## 2016-01-22 DIAGNOSIS — N529 Male erectile dysfunction, unspecified: Secondary | ICD-10-CM | POA: Insufficient documentation

## 2016-01-22 NOTE — Telephone Encounter (Signed)
Patient called today requesting clarification as to why his wife was to make an appointment with me.  I reminded the patient as to what we discussed during his office visit yesterday as to his wife's lack of interest in sexual activity.  If she was interested in pursuing evaluation and treatment for this, I was happy to see her as I had seen her in the past.  She was not interested in making the appointment at this time.

## 2016-01-29 ENCOUNTER — Encounter: Payer: Self-pay | Admitting: Pain Medicine

## 2016-01-29 ENCOUNTER — Ambulatory Visit: Payer: Managed Care, Other (non HMO) | Attending: Pain Medicine | Admitting: Pain Medicine

## 2016-01-29 VITALS — BP 131/65 | HR 68 | Temp 98.0°F | Resp 18 | Ht 72.0 in | Wt 328.0 lb

## 2016-01-29 DIAGNOSIS — G4730 Sleep apnea, unspecified: Secondary | ICD-10-CM | POA: Diagnosis not present

## 2016-01-29 DIAGNOSIS — M4806 Spinal stenosis, lumbar region: Secondary | ICD-10-CM | POA: Insufficient documentation

## 2016-01-29 DIAGNOSIS — M069 Rheumatoid arthritis, unspecified: Secondary | ICD-10-CM | POA: Insufficient documentation

## 2016-01-29 DIAGNOSIS — M2578 Osteophyte, vertebrae: Secondary | ICD-10-CM | POA: Insufficient documentation

## 2016-01-29 DIAGNOSIS — S83281A Other tear of lateral meniscus, current injury, right knee, initial encounter: Secondary | ICD-10-CM | POA: Insufficient documentation

## 2016-01-29 DIAGNOSIS — F329 Major depressive disorder, single episode, unspecified: Secondary | ICD-10-CM | POA: Insufficient documentation

## 2016-01-29 DIAGNOSIS — E039 Hypothyroidism, unspecified: Secondary | ICD-10-CM | POA: Diagnosis not present

## 2016-01-29 DIAGNOSIS — R937 Abnormal findings on diagnostic imaging of other parts of musculoskeletal system: Secondary | ICD-10-CM

## 2016-01-29 DIAGNOSIS — E781 Pure hyperglyceridemia: Secondary | ICD-10-CM | POA: Insufficient documentation

## 2016-01-29 DIAGNOSIS — J449 Chronic obstructive pulmonary disease, unspecified: Secondary | ICD-10-CM | POA: Insufficient documentation

## 2016-01-29 DIAGNOSIS — M5126 Other intervertebral disc displacement, lumbar region: Secondary | ICD-10-CM | POA: Insufficient documentation

## 2016-01-29 DIAGNOSIS — M961 Postlaminectomy syndrome, not elsewhere classified: Secondary | ICD-10-CM | POA: Insufficient documentation

## 2016-01-29 DIAGNOSIS — M797 Fibromyalgia: Secondary | ICD-10-CM | POA: Diagnosis not present

## 2016-01-29 DIAGNOSIS — G8929 Other chronic pain: Secondary | ICD-10-CM | POA: Insufficient documentation

## 2016-01-29 DIAGNOSIS — M545 Low back pain, unspecified: Secondary | ICD-10-CM | POA: Insufficient documentation

## 2016-01-29 DIAGNOSIS — H918X9 Other specified hearing loss, unspecified ear: Secondary | ICD-10-CM | POA: Insufficient documentation

## 2016-01-29 DIAGNOSIS — N529 Male erectile dysfunction, unspecified: Secondary | ICD-10-CM | POA: Insufficient documentation

## 2016-01-29 DIAGNOSIS — Z9889 Other specified postprocedural states: Secondary | ICD-10-CM | POA: Insufficient documentation

## 2016-01-29 DIAGNOSIS — Z79899 Other long term (current) drug therapy: Secondary | ICD-10-CM

## 2016-01-29 DIAGNOSIS — E119 Type 2 diabetes mellitus without complications: Secondary | ICD-10-CM | POA: Insufficient documentation

## 2016-01-29 DIAGNOSIS — E1140 Type 2 diabetes mellitus with diabetic neuropathy, unspecified: Secondary | ICD-10-CM | POA: Insufficient documentation

## 2016-01-29 DIAGNOSIS — Z8673 Personal history of transient ischemic attack (TIA), and cerebral infarction without residual deficits: Secondary | ICD-10-CM | POA: Insufficient documentation

## 2016-01-29 DIAGNOSIS — M25562 Pain in left knee: Secondary | ICD-10-CM

## 2016-01-29 DIAGNOSIS — X58XXXA Exposure to other specified factors, initial encounter: Secondary | ICD-10-CM | POA: Insufficient documentation

## 2016-01-29 DIAGNOSIS — J849 Interstitial pulmonary disease, unspecified: Secondary | ICD-10-CM | POA: Insufficient documentation

## 2016-01-29 DIAGNOSIS — R0602 Shortness of breath: Secondary | ICD-10-CM | POA: Insufficient documentation

## 2016-01-29 DIAGNOSIS — N401 Enlarged prostate with lower urinary tract symptoms: Secondary | ICD-10-CM | POA: Insufficient documentation

## 2016-01-29 DIAGNOSIS — M25561 Pain in right knee: Secondary | ICD-10-CM | POA: Diagnosis not present

## 2016-01-29 DIAGNOSIS — Z5181 Encounter for therapeutic drug level monitoring: Secondary | ICD-10-CM

## 2016-01-29 DIAGNOSIS — Z8709 Personal history of other diseases of the respiratory system: Secondary | ICD-10-CM | POA: Insufficient documentation

## 2016-01-29 DIAGNOSIS — Z7901 Long term (current) use of anticoagulants: Secondary | ICD-10-CM

## 2016-01-29 DIAGNOSIS — I10 Essential (primary) hypertension: Secondary | ICD-10-CM | POA: Diagnosis not present

## 2016-01-29 DIAGNOSIS — J45909 Unspecified asthma, uncomplicated: Secondary | ICD-10-CM | POA: Insufficient documentation

## 2016-01-29 DIAGNOSIS — M539 Dorsopathy, unspecified: Secondary | ICD-10-CM

## 2016-01-29 DIAGNOSIS — M25559 Pain in unspecified hip: Secondary | ICD-10-CM

## 2016-01-29 DIAGNOSIS — R51 Headache: Secondary | ICD-10-CM | POA: Diagnosis not present

## 2016-01-29 DIAGNOSIS — Z87891 Personal history of nicotine dependence: Secondary | ICD-10-CM | POA: Insufficient documentation

## 2016-01-29 DIAGNOSIS — Z79891 Long term (current) use of opiate analgesic: Secondary | ICD-10-CM

## 2016-01-29 DIAGNOSIS — E291 Testicular hypofunction: Secondary | ICD-10-CM | POA: Diagnosis not present

## 2016-01-29 DIAGNOSIS — N400 Enlarged prostate without lower urinary tract symptoms: Secondary | ICD-10-CM | POA: Insufficient documentation

## 2016-01-29 DIAGNOSIS — E669 Obesity, unspecified: Secondary | ICD-10-CM | POA: Insufficient documentation

## 2016-01-29 DIAGNOSIS — K219 Gastro-esophageal reflux disease without esophagitis: Secondary | ICD-10-CM | POA: Diagnosis not present

## 2016-01-29 DIAGNOSIS — R519 Headache, unspecified: Secondary | ICD-10-CM | POA: Insufficient documentation

## 2016-01-29 DIAGNOSIS — M5416 Radiculopathy, lumbar region: Secondary | ICD-10-CM

## 2016-01-29 DIAGNOSIS — M549 Dorsalgia, unspecified: Secondary | ICD-10-CM | POA: Diagnosis present

## 2016-01-29 DIAGNOSIS — Z87438 Personal history of other diseases of male genital organs: Secondary | ICD-10-CM | POA: Insufficient documentation

## 2016-01-29 DIAGNOSIS — F1190 Opioid use, unspecified, uncomplicated: Secondary | ICD-10-CM

## 2016-01-29 DIAGNOSIS — M79606 Pain in leg, unspecified: Secondary | ICD-10-CM | POA: Diagnosis not present

## 2016-01-29 DIAGNOSIS — Z7902 Long term (current) use of antithrombotics/antiplatelets: Secondary | ICD-10-CM | POA: Insufficient documentation

## 2016-01-29 DIAGNOSIS — Z0189 Encounter for other specified special examinations: Secondary | ICD-10-CM

## 2016-01-29 NOTE — Assessment & Plan Note (Signed)
EXAM: MRI LUMBAR SPINE WITHOUT AND WITH CONTRAST  TECHNIQUE: Multiplanar and multiecho pulse sequences of the lumbar spine were obtained without and with intravenous contrast.  CONTRAST: 104mL MULTIHANCE GADOBENATE DIMEGLUMINE 529 MG/ML IV SOLN  COMPARISON: Intraoperative lumbar spine images [facility] 11/05/2014.  Previous [facility] lumbar MRI 10/29/2014, and earlier.  FINDINGS: Same numbering system as on 10/29/2014 designating normal lumbar segmentation. Stable vertebral height and alignment since that time. Postoperative changes to the L3-L4 level including the left paracentral posterior paraspinal soft tissues. Granulation tissue with enhancement. No postoperative fluid collection identified. See additional details below. No acute osseous abnormality. Stable sclerotic focus L2 superior endplate, which appears benign with no enhancement.  Visualized lower thoracic spinal cord is normal with conus medularis at L1. No abnormal intradural enhancement.  Stable visualized abdominal viscera.  T11-T12: Stable mild disc bulge.  T12-L1: Stable and largely negative.  L1-L2: Stable minor disc bulge.  L2-L3: Stable mild disc bulge and facet hypertrophy. No significant stenosis.  L3-L4: Postoperative changes at this level on the left. Cephalad disc extrusion into the left lateral recess and left L3 neuroforamen. Size of the herniated disc is 10 x 17 x 18 mm. It does appear to be sequestered on these images. 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 Borderline to mild spinal stenosis overall.  Otherwise there is increased left facet joint fluid at this level. Stable facet hypertrophy. The underlying circumferential disc bulge here is stable. There is chronic increased T2 and STIR signal in the L3-L4 interspinous space which appears degenerative in nature.  L4-L5: Stable circumferential disc bulge.  Moderate facet and ligament flavum hypertrophy have not significantly changed. No significant stenosis.  L5-S1: Chronic severe disc space loss and chronic disc osteophyte complex are stable. Effaced lateral recesses as before. Stable mild to moderate facet hypertrophy. No significant spinal stenosis. Stable mild left and moderate right L5 foraminal stenosis.  IMPRESSION: 1. L3-L4 rightward and cephalad disc extrusion with suspected sequestered fragment 10 x 17 x 18 mm. 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 2. Postoperative changes on the left at L3-L4 since 10/29/2014, and otherwise stable lumbar spine.   Electronically Signed  By: Genevie Ann M.D.  On: 01/15/2015 09:08

## 2016-01-29 NOTE — Progress Notes (Signed)
Safety precautions to be maintained throughout the outpatient stay will include: orient to surroundings, keep bed in low position, maintain call bell within reach at all times, provide assistance with transfer out of bed and ambulation.  
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
## 2016-01-29 NOTE — Assessment & Plan Note (Signed)
The patient indicates having been on Plavix anticoagulation for the past 20 years, since he had a stroke.

## 2016-02-04 LAB — COMPLIANCE DRUG ANALYSIS, UR: PDF: 0

## 2016-03-30 ENCOUNTER — Encounter: Payer: Self-pay | Admitting: Physician Assistant

## 2016-03-30 ENCOUNTER — Ambulatory Visit: Payer: Self-pay | Admitting: Physician Assistant

## 2016-03-30 VITALS — BP 130/80 | HR 90 | Temp 97.6°F

## 2016-03-30 DIAGNOSIS — J069 Acute upper respiratory infection, unspecified: Secondary | ICD-10-CM

## 2016-03-30 MED ORDER — CEFDINIR 300 MG PO CAPS: 300.0000 mg | ORAL_CAPSULE | Freq: Two times a day (BID) | ORAL | Status: DC

## 2016-03-30 NOTE — Progress Notes (Signed)
S: states he was having trouble sleeping because he was coughing all night, was able to get small amount of opaque mucus out, no fever/chills, no cp/sob, no increased swelling in feet/legs  O: vitals wnl, nad, ENT wnl, neck supple no lymph, lungs c t a, cv rrr, no pedal edema  A: acute uri  P: omnicef, return if worsening or not better in 5 days

## 2016-04-02 NOTE — Progress Notes (Signed)
Patient ID: Eddie Carter, male   DOB: 07/29/1941, 75 y.o.   MRN: [id number] Per Susan's authorization I called in Medrol Dose Pack in to [facility].

## 2016-04-23 ENCOUNTER — Other Ambulatory Visit: Payer: Self-pay | Admitting: Orthopaedic Surgery

## 2016-04-23 DIAGNOSIS — M545 Low back pain: Secondary | ICD-10-CM

## 2016-05-01 ENCOUNTER — Ambulatory Visit
Admission: RE | Admit: 2016-05-01 | Discharge: 2016-05-01 | Disposition: A | Discharge status: Home / Self Care | Payer: Managed Care, Other (non HMO) | Source: Ambulatory Visit | Attending: Orthopaedic Surgery | Admitting: Orthopaedic Surgery

## 2016-05-01 DIAGNOSIS — M545 Low back pain: Secondary | ICD-10-CM

## 2016-09-16 ENCOUNTER — Other Ambulatory Visit: Payer: Self-pay | Admitting: Urology

## 2016-11-26 ENCOUNTER — Encounter: Payer: Self-pay | Admitting: *Deleted

## 2016-11-27 ENCOUNTER — Ambulatory Visit: Admit: 2016-11-27 | Payer: Managed Care, Other (non HMO) | Admitting: Gastroenterology

## 2016-11-27 SURGERY — COLONOSCOPY WITH PROPOFOL
Anesthesia: General

## 2016-12-04 ENCOUNTER — Encounter: Payer: Self-pay | Admitting: *Deleted

## 2016-12-07 ENCOUNTER — Ambulatory Visit
Admission: RE | Admit: 2016-12-07 | Discharge: 2016-12-07 | Disposition: A | Discharge status: Home / Self Care | Payer: Managed Care, Other (non HMO) | Source: Ambulatory Visit | Attending: Gastroenterology | Admitting: Gastroenterology

## 2016-12-07 ENCOUNTER — Ambulatory Visit: Payer: Managed Care, Other (non HMO) | Admitting: Anesthesiology

## 2016-12-07 ENCOUNTER — Encounter
Admission: RE | Disposition: A | Discharge status: Home / Self Care | Payer: Self-pay | Source: Ambulatory Visit | Attending: Gastroenterology

## 2016-12-07 ENCOUNTER — Encounter: Payer: Self-pay | Admitting: Anesthesiology

## 2016-12-07 DIAGNOSIS — Z87891 Personal history of nicotine dependence: Secondary | ICD-10-CM | POA: Diagnosis not present

## 2016-12-07 DIAGNOSIS — E119 Type 2 diabetes mellitus without complications: Secondary | ICD-10-CM | POA: Insufficient documentation

## 2016-12-07 DIAGNOSIS — Z7902 Long term (current) use of antithrombotics/antiplatelets: Secondary | ICD-10-CM | POA: Insufficient documentation

## 2016-12-07 DIAGNOSIS — Z7951 Long term (current) use of inhaled steroids: Secondary | ICD-10-CM | POA: Insufficient documentation

## 2016-12-07 DIAGNOSIS — K219 Gastro-esophageal reflux disease without esophagitis: Secondary | ICD-10-CM | POA: Insufficient documentation

## 2016-12-07 DIAGNOSIS — Z85828 Personal history of other malignant neoplasm of skin: Secondary | ICD-10-CM | POA: Diagnosis not present

## 2016-12-07 DIAGNOSIS — N400 Enlarged prostate without lower urinary tract symptoms: Secondary | ICD-10-CM | POA: Insufficient documentation

## 2016-12-07 DIAGNOSIS — D124 Benign neoplasm of descending colon: Secondary | ICD-10-CM | POA: Insufficient documentation

## 2016-12-07 DIAGNOSIS — Z79899 Other long term (current) drug therapy: Secondary | ICD-10-CM | POA: Diagnosis not present

## 2016-12-07 DIAGNOSIS — D122 Benign neoplasm of ascending colon: Secondary | ICD-10-CM | POA: Diagnosis not present

## 2016-12-07 DIAGNOSIS — K6389 Other specified diseases of intestine: Secondary | ICD-10-CM | POA: Insufficient documentation

## 2016-12-07 DIAGNOSIS — Z6841 Body Mass Index (BMI) 40.0 and over, adult: Secondary | ICD-10-CM | POA: Insufficient documentation

## 2016-12-07 DIAGNOSIS — Z886 Allergy status to analgesic agent status: Secondary | ICD-10-CM | POA: Diagnosis not present

## 2016-12-07 DIAGNOSIS — E669 Obesity, unspecified: Secondary | ICD-10-CM | POA: Diagnosis not present

## 2016-12-07 DIAGNOSIS — D123 Benign neoplasm of transverse colon: Secondary | ICD-10-CM | POA: Insufficient documentation

## 2016-12-07 DIAGNOSIS — K635 Polyp of colon: Secondary | ICD-10-CM | POA: Insufficient documentation

## 2016-12-07 DIAGNOSIS — Z8673 Personal history of transient ischemic attack (TIA), and cerebral infarction without residual deficits: Secondary | ICD-10-CM | POA: Diagnosis not present

## 2016-12-07 DIAGNOSIS — Z1211 Encounter for screening for malignant neoplasm of colon: Secondary | ICD-10-CM | POA: Diagnosis present

## 2016-12-07 DIAGNOSIS — K5790 Diverticulosis of intestine, part unspecified, without perforation or abscess without bleeding: Secondary | ICD-10-CM | POA: Insufficient documentation

## 2016-12-07 DIAGNOSIS — G4730 Sleep apnea, unspecified: Secondary | ICD-10-CM | POA: Diagnosis not present

## 2016-12-07 DIAGNOSIS — Z86010 Personal history of colonic polyps: Secondary | ICD-10-CM | POA: Diagnosis present

## 2016-12-07 DIAGNOSIS — J449 Chronic obstructive pulmonary disease, unspecified: Secondary | ICD-10-CM | POA: Diagnosis not present

## 2016-12-07 DIAGNOSIS — D125 Benign neoplasm of sigmoid colon: Secondary | ICD-10-CM | POA: Insufficient documentation

## 2016-12-07 HISTORY — PX: COLONOSCOPY WITH PROPOFOL: SHX5780

## 2016-12-07 HISTORY — DX: Male erectile dysfunction, unspecified: N52.9

## 2016-12-07 SURGERY — COLONOSCOPY WITH PROPOFOL
Anesthesia: General

## 2016-12-07 MED ORDER — PROPOFOL 10 MG/ML IV BOLUS
INTRAVENOUS | Status: AC
  Filled 2016-12-07: qty 20

## 2016-12-07 MED ORDER — SODIUM CHLORIDE 0.9 % IV SOLN
INTRAVENOUS | Status: DC
  Administered 2016-12-07 (×2): via INTRAVENOUS

## 2016-12-07 MED ORDER — SODIUM CHLORIDE 0.9 % IV SOLN: INTRAVENOUS | Status: DC

## 2016-12-07 MED ORDER — PROPOFOL 500 MG/50ML IV EMUL
INTRAVENOUS | Status: DC | PRN
  Administered 2016-12-07: 100 ug/kg/min via INTRAVENOUS

## 2016-12-07 MED ORDER — PROPOFOL 500 MG/50ML IV EMUL
INTRAVENOUS | Status: AC
  Filled 2016-12-07: qty 50

## 2016-12-07 MED ORDER — PROPOFOL 10 MG/ML IV BOLUS
INTRAVENOUS | Status: DC | PRN
  Administered 2016-12-07: 50 mg via INTRAVENOUS
  Administered 2016-12-07: 30 mg via INTRAVENOUS

## 2016-12-07 NOTE — Transfer of Care (Signed)
Immediate Anesthesia Transfer of Care Note  Patient: Eddie Carter  Procedure(s) Performed: Procedure(s): COLONOSCOPY WITH PROPOFOL (N/A)  Patient Location: PACU  Anesthesia Type:General  Level of Consciousness: sedated  Airway & Oxygen Therapy: Patient Spontanous Breathing and Patient connected to nasal cannula oxygen  Post-op Assessment: Report given to RN and Post -op Vital signs reviewed and stable  Post vital signs: Reviewed and stable  Last Vitals:  Vitals:   12/07/16 1123 12/07/16 1124  BP: (!) 142/75 135/81  Pulse: 82   Resp: (!) 22   Temp: 36.8 C     Last Pain:  Vitals:   12/07/16 1123  TempSrc: Tympanic         Complications: No apparent anesthesia complications

## 2016-12-07 NOTE — H&P (Addendum)
Outpatient short stay form Pre-procedure 12/07/2016 11:36 AM Lollie Sails MD  Primary Physician: Dr Rinaldo Cloud  Reason for visit:  Colonoscopy  History of present illness:  Patient is a 76 year old male presenting today as above. His last colonoscopy was about 3 years ago. He had a "medium-sized" adenoma removed at that time. Patient does take Plavix but states he is held that for 7 days. He is allergic to aspirin takes no aspirin products. There are no other blood thinning agents. He tolerated his prep well. His last bowel movement this morning was watery. He states he finished his prep at about 9:30 and this has been discussed with anesthesia and clearance given for the procedure.    Current Facility-Administered Medications:  .  0.9 %  sodium chloride infusion, , Intravenous, Continuous, Lollie Sails, MD .  0.9 %  sodium chloride infusion, , Intravenous, Continuous, Lollie Sails, MD  Prescriptions Prior to Admission  Medication Sig Dispense Refill Last Dose  . acetaminophen (TYLENOL) 500 MG tablet Take 1,000 mg by mouth 2 (two) times daily.   Past Week at Unknown time  . budesonide-formoterol (SYMBICORT) 160-4.5 MCG/ACT inhaler Inhale 2 puffs into the lungs 2 (two) times daily. 3 Inhaler 1 12/06/2016 at Unknown time  . Cholecalciferol (VITAMIN D) 1000 UNITS capsule Take 3,000 Units by mouth daily.   Past Week at Unknown time  . Cholecalciferol (VITAMIN D3) 2000 units TABS Take by mouth 2 (two) times daily.   Past Week at Unknown time  . CINNAMON PO Take 4,000 mg by mouth daily at 2 PM.   Past Week at Unknown time  . clopidogrel (PLAVIX) 75 MG tablet Take 75 mg by mouth daily.   11/30/2016  . Coenzyme Q10 (CO Q-10) 100 MG CAPS Take 1 tablet by mouth at bedtime.   Past Week at Unknown time  . docusate sodium (COLACE) 100 MG capsule Take 100 mg by mouth 2 (two) times daily. Reported on 01/21/2016   Past Week at Unknown time  . dutasteride (AVODART) 0.5 MG capsule Take 1 capsule  (0.5 mg total) by mouth daily. 90 capsule 12 Past Week at Unknown time  . fluticasone (FLONASE) 50 MCG/ACT nasal spray Place 2 sprays into both nostrils daily. 16 g 5 12/06/2016 at Unknown time  . furosemide (LASIX) 40 MG tablet Take 40 mg by mouth daily.   Past Week at Unknown time  . HYDROcodone-acetaminophen (NORCO/VICODIN) 5-325 MG per tablet Take 1 tablet by mouth every 6 (six) hours as needed for moderate pain.   Past Week at Unknown time  . hydrOXYzine (ATARAX/VISTARIL) 10 MG tablet Take 2 tablets by mouth at bedtime.   Past Week at Unknown time  . Iodoquinol-HC-Aloe Polysacch (ALCORTIN A) 1-2-1 % GEL Apply topically.   Past Week at Unknown time  . levothyroxine (SYNTHROID, LEVOTHROID) 50 MCG tablet Take 50 mcg by mouth daily. Take on an empty stomach with a glass of water at least 30-60 minutes before breakfast.   Past Week at Unknown time  . magnesium hydroxide (MILK OF MAGNESIA) 400 MG/5ML suspension Take 30 mLs by mouth daily.   Past Week at Unknown time  . Multiple Vitamin (MULTIVITAMIN) capsule Take 1 capsule by mouth daily.   Past Week at Unknown time  . omega-3 acid ethyl esters (LOVAZA) 1 g capsule Take 1 g by mouth.   Past Week at Unknown time  . Omega-3 Fatty Acids (FISH OIL) 1000 MG CAPS Take by mouth.   Past Week at Unknown  time  . polyethylene glycol (MIRALAX / GLYCOLAX) packet Take 17 g by mouth daily as needed. Reported on 01/21/2016   Past Week at Unknown time  . pregabalin (LYRICA) 50 MG capsule Take 50 mg by mouth 2 (two) times daily.   Past Week at Unknown time  . RA KRILL OIL 500 MG CAPS Take by mouth.   Past Week at Unknown time  . solifenacin (VESICARE) 10 MG tablet Take 1 tablet (10 mg total) by mouth daily as needed. 90 tablet 12 Past Week at Unknown time  . tamsulosin (FLOMAX) 0.4 MG CAPS capsule Take 1 capsule (0.4 mg total) by mouth daily after supper. 90 capsule 12 Past Week at Unknown time  . tiotropium (SPIRIVA) 18 MCG inhalation capsule Place 1 capsule (18 mcg  total) into inhaler and inhale daily. 90 capsule 1 12/06/2016 at Unknown time  . topiramate (TOPAMAX) 200 MG tablet Take 200 mg by mouth daily. Nightly     . cefdinir (OMNICEF) 300 MG capsule Take 1 capsule (300 mg total) by mouth 2 (two) times daily. (Patient not taking: Reported on 12/07/2016) 20 capsule 0 Not Taking at Unknown time     Allergies  Allergen Reactions  . Asa [Aspirin] Shortness Of Breath  . Nsaids Other (See Comments)    Makes him bleed.     Past Medical History:  Diagnosis Date  . Arthritis   . Asthma   . BPH (benign prostatic hyperplasia)   . 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 Complication of anesthesia    after septoplasty and uvulectomy-was in icu- [facility] - 10 yrs. ago  . COPD (chronic obstructive pulmonary disease) ([place])   . Diabetes mellitus without complication ([place])   . ED (erectile dysfunction)   . Full dentures   . GERD (gastroesophageal reflux disease)    no meds  . Headache    difficulty with headaches- 1950's , again in 1967, 2008- again problems with migrraines   . History of GI bleed   . History of urethral stricture   . HOH (hard of hearing)   . Hypogonadism in male   . Joint pain   . Medial meniscus, posterior horn derangement    left knee  . Obesity   . Pneumonia 2014   [facility]  . Shortness of breath   . Skin cancer   . Sleep apnea    uses a cpap, most nights   . Stroke [facility]) 1995   some speech and thought processes slow  . Traumatic tear of lateral meniscus of right knee    right knee  . Urinary frequency   . Urinary incontinence     Review of systems:      Physical Exam    Heart and lungs: Regular rate and rhythm without rub or gallop, lungs are bilaterally clear.    HEENT: Normocephalic atraumatic eyes are anicteric    Other:     Pertinant exam for procedure: Soft nontender nondistended bowel sounds positive normoactive. Obese. Mild epigastric discomfort to palpation rebound. Not possible to palpate internal  organs.    Planned proceedures: Colonoscopy and indicated procedures. I have discussed the risks benefits and complications of procedures to include not limited to bleeding, infection, perforation and the risk of sedation and the patient wishes to proceed.    Lollie Sails, MD Gastroenterology 12/07/2016  11:36 AM

## 2016-12-07 NOTE — Brief Op Note (Deleted)
Polyp cold snare right colon not retrieved

## 2016-12-07 NOTE — Anesthesia Preprocedure Evaluation (Signed)
Anesthesia Evaluation  Patient identified by MRN, date of birth, ID band Patient awake    Reviewed: Allergy & Precautions, H&P , NPO status , Patient's Chart, lab work & pertinent test results  History of Anesthesia Complications Negative for: history of anesthetic complications  Airway Mallampati: III  TM Distance: <3 FB Neck ROM: limited    Dental  (+) Poor Dentition, Missing, Upper Dentures, Lower Dentures   Pulmonary shortness of breath and with exertion, asthma , sleep apnea , pneumonia, resolved, COPD, former smoker,    Pulmonary exam normal breath sounds clear to auscultation       Cardiovascular Exercise Tolerance: Good (-) angina(-) Past MI and (-) DOE negative cardio ROS Normal cardiovascular exam Rhythm:regular Rate:Normal     Neuro/Psych  Headaches, CVA negative psych ROS   GI/Hepatic Neg liver ROS, GERD  Controlled,  Endo/Other  diabetes, Type 2  Renal/GU negative Renal ROS  negative genitourinary   Musculoskeletal  (+) Arthritis ,   Abdominal   Peds  Hematology negative hematology ROS (+)   Anesthesia Other Findings Past Medical History: No date: Arthritis No date: Asthma No date: BPH (benign prostatic hyperplasia) No date: Cancer ([place])     Comment: face- basal cell No date: Complication of anesthesia     Comment: after septoplasty and uvulectomy-was in icu-               [facility] - 10 yrs. ago No date: COPD (chronic obstructive pulmonary disease) (* No date: Diabetes mellitus without complication ([facility]) No date: ED (erectile dysfunction) No date: Full dentures No date: GERD (gastroesophageal reflux disease)     Comment: no meds No date: Headache     Comment: difficulty with headaches- 1950's , again in               1967, 2008- again problems with migrraines  No date: History of GI bleed No date: History of urethral stricture No date: HOH (hard of hearing) No date: Hypogonadism  in male No date: Joint pain No date: Medial meniscus, posterior horn derangement     Comment: left knee No date: Obesity 2014: Pneumonia     Comment: [facility] No date: Shortness of breath No date: Skin cancer No date: Sleep apnea     Comment: uses a cpap, most nights  1995: Stroke ([place])     Comment: some speech and thought processes slow No date: Traumatic tear of lateral meniscus of right kn*     Comment: right knee No date: Urinary frequency No date: Urinary incontinence  Past Surgical History: 1963: APPENDECTOMY No date: CARPAL TUNNEL RELEASE     Comment: left No date: COLONOSCOPY No date: EYE SURGERY     Comment: foreign body removed fr. eye, ? side  1976: FOOT FOREIGN BODY REMOVAL     Comment: left foot -multiple pieces glass 1995: HEMORROIDECTOMY 01/17/2013: KNEE ARTHROSCOPY Bilateral     Comment: Procedure: ARTHROSCOPY KNEE BILATERAL WITH               MEDIAL AND LATERAL MENISECTOMIES;  Surgeon:               Lorn Junes, MD;  Location: [place];  Service: Orthopedics;                Laterality: Bilateral; 11/05/2014: LUMBAR LAMINECTOMY N/A     Comment: Procedure: LEFT L3-4 MICRODISCECTOMY;  Surgeon: Jessy Oto, MD;  Location: [place];                Service: Orthopedics;  Laterality: N/A; 02/12/2015: LUMBAR LAMINECTOMY/DECOMPRESSION MICRODISCECTO* N/A     Comment: Procedure: RE-DO LEFT L3-4 MICRODISCECTOMY;                Surgeon: Jessy Oto, MD;  Location: [place];                Service: Orthopedics;  Laterality: N/A; 1995: NASAL SEPTUM SURGERY No date: TONSILLECTOMY No date: UPPER GI ENDOSCOPY 1995: UVULOPALATOPHARYNGOPLASTY (UPPP)/TONSILLECTOMY*     Comment: same time with septoplasty-ended up ICU almost              trached     Reproductive/Obstetrics negative OB ROS                             Anesthesia Physical Anesthesia Plan  ASA: III  Anesthesia Plan: General   Post-op  Pain Management:    Induction:   Airway Management Planned:   Additional Equipment:   Intra-op Plan:   Post-operative Plan:   Informed Consent: I have reviewed the patients History and Physical, chart, labs and discussed the procedure including the risks, benefits and alternatives for the proposed anesthesia with the patient or authorized representative who has indicated his/her understanding and acceptance.   Dental Advisory Given  Plan Discussed with: Anesthesiologist, CRNA and Surgeon  Anesthesia Plan Comments:         Anesthesia Quick Evaluation

## 2016-12-07 NOTE — Op Note (Signed)
[facility] Gastroenterology Patient Name: Eddie Carter Procedure Date: 12/07/2016 10:53 AM MRN: [id number] Account #: [id number] Date of Birth: February 04, 1941 Admit Type: Outpatient Age: 76 Room: [facility] ENDO ROOM 3 Gender: Male Note Status: Finalized Procedure:            Colonoscopy Indications:          Personal history of colonic polyps Providers:            Lollie Sails, MD Medicines:            Monitored Anesthesia Care Complications:        No immediate complications. Procedure:            Pre-Anesthesia Assessment:                       - ASA Grade Assessment: III - A patient with severe                        systemic disease.                       After obtaining informed consent, the colonoscope was                        passed under direct vision. Throughout the procedure,                        the patient's blood pressure, pulse, and oxygen                        saturations were monitored continuously. The                        Colonoscope was introduced through the anus and                        advanced to the the cecum, identified by appendiceal                        orifice and ileocecal valve. The colonoscopy was                        unusually difficult due to significant looping and a                        tortuous colon. Successful completion of the procedure                        was aided by changing the patient to a supine position,                        changing the patient to a prone position, using manual                        pressure and lavage. The patient tolerated the                        procedure well. The quality of the bowel preparation                        was good. Findings:  A 4 mm polyp was found in the distal sigmoid colon. The polyp was       semi-pedunculated. The polyp was removed with a cold snare. Resection       and retrieval were complete.      A 4 mm polyp was found in the descending colon. The  polyp was sessile.       The polyp was removed with a cold snare. Resection and retrieval were       complete.      A 3 mm polyp was found in the transverse colon. The polyp was sessile.       The polyp was removed with a cold snare. Resection and retrieval were       complete.      Two sessile polyps were found in the proximal transverse colon. The       polyps were 1 to 2 mm in size. These polyps were removed with a cold       biopsy forceps. Resection and retrieval were complete.      Eight sessile polyps were found in the transverse colon. The polyps were       1 to 3 mm in size. These polyps were removed with a cold biopsy forceps.       Resection and retrieval were complete.      A 6 mm polyp was found in the hepatic flexure. The polyp was       semi-pedunculated. The polyp was removed with a cold snare. Resection       and retrieval were complete.      A 3 mm polyp was found in the ascending colon. The polyp was sessile.       The polyp was removed with a cold biopsy forceps. Resection and       retrieval were complete.      Two sessile polyps were found in the distal sigmoid colon. The polyps       were 3 to 4 mm in size. These polyps were removed with a cold snare.       Resection and retrieval were complete.      A 7 mm polyp was found in the distal sigmoid colon. The polyp was       semi-pedunculated. The polyp was removed with a cold snare. Resection       and retrieval were complete. To prevent bleeding after the polypectomy,       one hemostatic clip was successfully placed. There was no bleeding       during the maneuver.      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 Impression:           - One 4 mm polyp in the distal sigmoid colon, removed                        with a cold snare. Resected and retrieved.                       - One 4 mm polyp in the descending colon, removed with                        a  cold snare. Resected and retrieved.                       -  One 3 mm polyp in the transverse colon, removed with                        a cold snare. Resected and retrieved.                       - Two 1 to 2 mm polyps in the proximal transverse                        colon, removed with a cold biopsy forceps. Resected and                        retrieved.                       - Eight 1 to 3 mm polyps in the transverse colon,                        removed with a cold biopsy forceps. Resected and                        retrieved.                       - One 6 mm polyp at the hepatic flexure, removed with a                        cold snare. Resected and retrieved.                       - One 3 mm polyp in the ascending colon, removed with a                        cold biopsy forceps. Resected and retrieved.                       - Two 3 to 4 mm polyps in the distal sigmoid colon,                        removed with a cold snare. Resected and retrieved.                       - One 7 mm polyp in the distal sigmoid colon, removed                        with a cold snare. Resected and retrieved. Clip was                        placed. Recommendation:       - Discharge patient to home. Procedure Code(s):    --- Professional ---                       [phone], Colonoscopy, flexible; with removal of tumor(s),                        polyp(s), or other lesion(s) by snare technique                       [id number], 59, Colonoscopy, flexible;  with biopsy, single                        or multiple Diagnosis Code(s):    --- Professional ---                       D12.4, Benign neoplasm of descending colon                       D12.3, Benign neoplasm of transverse colon (hepatic                        flexure or splenic flexure)                       D12.2, Benign neoplasm of ascending colon                       D12.5, Benign neoplasm of sigmoid colon                       Z86.010, Personal history of  colonic polyps CPT copyright 2016 American Medical Association. All rights reserved. The codes documented in this report are preliminary and upon coder review may  be revised to meet current compliance requirements. Lollie Sails, MD 12/07/2016 1:13:50 PM This report has been signed electronically. Number of Addenda: 0 Note Initiated On: 12/07/2016 10:53 AM Scope Withdrawal Time: 0 hours 26 minutes 29 seconds  Total Procedure Duration: 1 hour 19 minutes 2 seconds       [facility]

## 2016-12-07 NOTE — Anesthesia Postprocedure Evaluation (Signed)
Anesthesia Post Note  Patient: Eddie Carter  Procedure(s) Performed: Procedure(s) (LRB): COLONOSCOPY WITH PROPOFOL (N/A)  Patient location during evaluation: Endoscopy Anesthesia Type: General Level of consciousness: awake and alert Pain management: pain level controlled Vital Signs Assessment: post-procedure vital signs reviewed and stable Respiratory status: spontaneous breathing, nonlabored ventilation, respiratory function stable and patient connected to nasal cannula oxygen Cardiovascular status: blood pressure returned to baseline and stable Postop Assessment: no signs of nausea or vomiting Anesthetic complications: no     Last Vitals:  Vitals:   12/07/16 1344 12/07/16 1354  BP: 125/73 127/77  Pulse: 74 72  Resp: 19 19  Temp:      Last Pain:  Vitals:   12/07/16 1314  TempSrc: Tympanic                 Precious Haws P[place]it[place]

## 2016-12-08 ENCOUNTER — Encounter: Payer: Self-pay | Admitting: Gastroenterology

## 2016-12-08 LAB — SURGICAL PATHOLOGY

## 2017-02-18 ENCOUNTER — Ambulatory Visit: Payer: Medicare Other | Admitting: Urology

## 2017-02-18 ENCOUNTER — Encounter: Payer: Self-pay | Admitting: Urology

## 2017-02-18 ENCOUNTER — Ambulatory Visit (INDEPENDENT_AMBULATORY_CARE_PROVIDER_SITE_OTHER): Payer: Managed Care, Other (non HMO) | Admitting: Urology

## 2017-02-18 VITALS — BP 174/83 | HR 112 | Ht 72.0 in | Wt 314.9 lb

## 2017-02-18 DIAGNOSIS — N401 Enlarged prostate with lower urinary tract symptoms: Secondary | ICD-10-CM | POA: Diagnosis not present

## 2017-02-18 DIAGNOSIS — N138 Other obstructive and reflux uropathy: Secondary | ICD-10-CM

## 2017-02-18 DIAGNOSIS — N3942 Incontinence without sensory awareness: Secondary | ICD-10-CM | POA: Diagnosis not present

## 2017-02-18 LAB — BLADDER SCAN AMB NON-IMAGING: SCAN RESULT: 15

## 2017-02-18 NOTE — Progress Notes (Signed)
Patient accused me of hollering at him when I was triaging him.  He stated I'm right here I'm not outside. I let the patient know that I was sorry that he thought that or took it that way and I explained that I was speaking to him in my normal tone of voice that I always speak in. The patient went up about 10 octaves and starting screaming his answers to my questions at me and I let the patient know that I did not have to put up with it and I excused myself from the room. Toniann Fail LPN took over triaging the patient.

## 2017-02-18 NOTE — Progress Notes (Signed)
8:[name] June 04, 1941 [id number]  Referring provider: [name], MD [address] [place], [place] [address]  Chief Complaint  Patient presents with  . Benign Prostatic Hypertrophy    1 year follow up     HPI: Patient is a 76 year old Caucasian male with hypogonadism, erectile dysfunction and BPH with LUTS who presents today for 6 month follow-up.  Patient was 30 minutes late for his appointment.    Hypogonadism Patient is no longer on testosterone therapy  BPH with LUTS His IPSS score today is 11, which is moderate lower urinary tract symptomatology. He is mixed with his quality life due to his urinary symptoms.  His PVR is 15 mL.  His previous I PSS score was 21/5.  His major complaint today is incontinence.  He has had these symptoms for over 10 years.  He denies any dysuria, hematuria or suprapubic pain.   He currently taking tamsulosin, dutasteride and Vesicare.  His has had an urethral stricture that had been dilated in the remote past.  He also denies any recent fevers, chills, nausea or vomiting.  He does not have a family history of PCa.     IPSS    Row Name 02/18/17 1600         International Prostate Symptom Score   How often have you had the sensation of not emptying your bladder? Less than 1 in 5     How often have you had to urinate less than every two hours? Less than 1 in 5 times     How often have you found you stopped and started again several times when you urinated? Not at All     How often have you found it difficult to postpone urination? Almost always     How often have you had a weak urinary stream? Less than 1 in 5 times     How often have you had to strain to start urination? Less than 1 in 5 times     How many times did you typically get up at night to urinate? 2 Times     Total IPSS Score 11       Quality of Life due to urinary symptoms   If you were to spend the rest of your life with your urinary condition just the way it is  now how would you feel about that? Mixed        Score:  1-7 Mild 8-19 Moderate 20-35 Severe   Erectile dysfunction He and his wife are not sexually active at this time.   I have some concerns regarding Eddie Carter's cognitive function.  Staff informs me that the patient called yesterday to make the appointment, so that he could be seen at our [employer] location before we relocated to the [facility] at [facility].  The reason for this is he is using a walker and the parking can be challenging at the hospital.  Today, he went to the [facility] for his appointment.  He then called the office and was belligerent with the staff.  He was still angry when he checked in for his appointment and blaming the staff for giving him incorrect information.  He claims the staff instructed him to go to our new location and this further delayed his appointment.  He then became belligerent when his medical history was being taken by staff.  He did eventually calm down and I was not able to actually see  the patient until 45 minutes after his original appointment time.     PMH: Past Medical History:  Diagnosis Date  . Arthritis   . Asthma   . BPH (benign prostatic hyperplasia)   . 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 Complication of anesthesia    after septoplasty and uvulectomy-was in icu- [facility] - 10 yrs. ago  . COPD (chronic obstructive pulmonary disease) ([place])   . Diabetes mellitus without complication ([place])   . ED (erectile dysfunction)   . Full dentures   . GERD (gastroesophageal reflux disease)    no meds  . Headache    difficulty with headaches- 1950's , again in 1967, 2008- again problems with migrraines   . History of GI bleed   . History of urethral stricture   . HOH (hard of hearing)   . Hypogonadism in male   . Joint pain   . Medial meniscus, posterior horn derangement    left knee  . Obesity   . Pneumonia 2014   [facility]  . Shortness of breath   . Skin  cancer   . Sleep apnea    uses a cpap, most nights   . Stroke [facility]) 1995   some speech and thought processes slow  . Traumatic tear of lateral meniscus of right knee    right knee  . Urinary frequency   . Urinary incontinence     Surgical History: Past Surgical History:  Procedure Laterality Date  . APPENDECTOMY  1963  . CARPAL TUNNEL RELEASE     left  . COLONOSCOPY    . COLONOSCOPY WITH PROPOFOL N/A 12/07/2016   Procedure: COLONOSCOPY WITH PROPOFOL;  Surgeon: Lollie Sails, MD;  Location: [facility] ENDOSCOPY;  Service: Endoscopy;  Laterality: N/A;  . EYE SURGERY     foreign body removed fr. eye, ? side   . FOOT FOREIGN BODY REMOVAL  1976   left foot -multiple pieces glass  . HEMORROIDECTOMY  1995  . KNEE ARTHROSCOPY Bilateral 01/17/2013   Procedure: ARTHROSCOPY KNEE BILATERAL WITH MEDIAL AND LATERAL MENISECTOMIES;  Surgeon: Lorn Junes, MD;  Location: [place];  Service: Orthopedics;  Laterality: Bilateral;  . LUMBAR LAMINECTOMY N/A 11/05/2014   Procedure: LEFT L3-4 MICRODISCECTOMY;  Surgeon: Jessy Oto, MD;  Location: [place];  Service: Orthopedics;  Laterality: N/A;  . LUMBAR LAMINECTOMY/DECOMPRESSION MICRODISCECTOMY N/A 02/12/2015   Procedure: RE-DO LEFT L3-4 MICRODISCECTOMY;  Surgeon: Jessy Oto, MD;  Location: [place];  Service: Orthopedics;  Laterality: N/A;  . NASAL SEPTUM SURGERY  1995  . TONSILLECTOMY    . UPPER GI ENDOSCOPY    . UVULOPALATOPHARYNGOPLASTY (UPPP)/TONSILLECTOMY/SEPTOPLASTY  1995   same time with septoplasty-ended up ICU almost trached    Home Medications:  Allergies as of 02/18/2017      Reactions   Asa [aspirin] Shortness Of Breath   Nsaids Other (See Comments)   Makes him bleed.      Medication List       Accurate as of 02/18/17 11:59 PM. Always use your most recent med list.          acetaminophen 500 MG tablet Commonly known as:  TYLENOL Take 1,000 mg by mouth 2 (two) times daily.   albuterol 108 (90 Base) MCG/ACT  inhaler Commonly known as:  PROVENTIL HFA;VENTOLIN HFA Inhale into the lungs.   ALCORTIN A 1-2-1 % Gel Apply topically.   Iodoquinol-HC-Aloe Polysacch 1-2-1 % Gel Apply topically.   budesonide-formoterol 160-4.5 MCG/ACT inhaler Commonly  known as:  SYMBICORT Inhale 2 puffs into the lungs 2 (two) times daily.   cefdinir 300 MG capsule Commonly known as:  OMNICEF Take 1 capsule (300 mg total) by mouth 2 (two) times daily.   CINNAMON PO Take 4,000 mg by mouth daily at 2 PM.   clopidogrel 75 MG tablet Commonly known as:  PLAVIX Take 75 mg by mouth daily.   Co Q-10 100 MG Caps Take 1 tablet by mouth at bedtime.   docusate sodium 100 MG capsule Commonly known as:  COLACE Take 100 mg by mouth 2 (two) times daily. Reported on 01/21/2016   dutasteride 0.5 MG capsule Commonly known as:  AVODART Take 1 capsule (0.5 mg total) by mouth daily.   Fish Oil 1000 MG Caps Take by mouth.   fluticasone 50 MCG/ACT nasal spray Commonly known as:  FLONASE Place 2 sprays into both nostrils daily.   furosemide 40 MG tablet Commonly known as:  LASIX Take 40 mg by mouth daily.   HYDROcodone-acetaminophen 5-325 MG tablet Commonly known as:  NORCO/VICODIN Take 1 tablet by mouth every 6 (six) hours as needed for moderate pain.   hydrOXYzine 10 MG tablet Commonly known as:  ATARAX/VISTARIL Take 2 tablets by mouth at bedtime.   levothyroxine 50 MCG tablet Commonly known as:  SYNTHROID, LEVOTHROID Take 50 mcg by mouth daily. Take on an empty stomach with a glass of water at least 30-60 minutes before breakfast.   magnesium hydroxide 400 MG/5ML suspension Commonly known as:  MILK OF MAGNESIA Take 30 mLs by mouth daily.   multivitamin capsule Take 1 capsule by mouth daily.   omega-3 acid ethyl esters 1 g capsule Commonly known as:  LOVAZA Take 1 g by mouth.   polyethylene glycol packet Commonly known as:  MIRALAX / GLYCOLAX Take 17 g by mouth daily as needed. Reported on 01/21/2016    pregabalin 50 MG capsule Commonly known as:  LYRICA Take 50 mg by mouth 2 (two) times daily.   RA KRILL OIL 500 MG Caps Take by mouth.   solifenacin 10 MG tablet Commonly known as:  VESICARE Take 1 tablet (10 mg total) by mouth daily as needed.   tiotropium 18 MCG inhalation capsule Commonly known as:  SPIRIVA Place 1 capsule (18 mcg total) into inhaler and inhale daily.   topiramate 200 MG tablet Commonly known as:  TOPAMAX Take 200 mg by mouth daily. Nightly   traMADol 50 MG tablet Commonly known as:  ULTRAM Take by mouth.   Vitamin D 1000 units capsule Take 3,000 Units by mouth daily.   Vitamin D3 2000 units Tabs Take by mouth 2 (two) times daily.       Allergies:  Allergies  Allergen Reactions  . Asa [Aspirin] Shortness Of Breath  . Nsaids Other (See Comments)    Makes him bleed.    Family History: Family History  Problem Relation Age of Onset  . Cancer Mother   . Heart attack Father   . Heart disease    . Arthritis    . Prostate cancer Neg Hx   . Kidney disease Neg Hx     Social History:  reports that he quit smoking about 21 years ago. His smoking use included Cigarettes. He has a 60.00 pack-year smoking history. He has never used smokeless tobacco. He reports that he drinks alcohol. He reports that he does not use drugs.  ROS: UROLOGY Frequent Urination?: Yes Hard to postpone urination?: Yes Burning/pain with urination?: No Get up  at night to urinate?: Yes Leakage of urine?: Yes Urine stream starts and stops?: No Trouble starting stream?: No Do you have to strain to urinate?: Yes Blood in urine?: No Urinary tract infection?: No Sexually transmitted disease?: No Injury to kidneys or bladder?: No Painful intercourse?: No Weak stream?: Yes Erection problems?: Yes Penile pain?: No  Gastrointestinal Nausea?: No Vomiting?: No Indigestion/heartburn?: No Diarrhea?: Yes Constipation?: No  Constitutional Fever: No Night sweats?:  No Weight loss?: No Fatigue?: No  Skin Skin rash/lesions?: No Itching?: No  Eyes Blurred vision?: No Double vision?: No  Ears/Nose/Throat Sore throat?: No Sinus problems?: No  Hematologic/Lymphatic Swollen glands?: No Easy bruising?: No  Cardiovascular Leg swelling?: No Chest pain?: No  Respiratory Cough?: No Shortness of breath?: No  Endocrine Excessive thirst?: No  Musculoskeletal Back pain?: No Joint pain?: No  Neurological Headaches?: No Dizziness?: No  Psychologic Depression?: No Anxiety?: No  Physical Exam: BP (!) 174/83   Pulse (!) 112   Ht 6' (1.829 m)   Wt (!) 314 lb 14.4 oz (142.8 kg)   BMI 42.71 kg/m   GU: Patient with uncircumcised phallus. Foreskin easily retracted  Urethral meatus is patent.  No penile discharge. No penile lesions or rashes. Scrotum without lesions, cysts, rashes and/or edema.  Testicles are located scrotally bilaterally. No masses are appreciated in the testicles. Left and right epididymis are normal. Rectal: Patient with  normal sphincter tone. Perineum without scarring or rashes. No rectal masses are appreciated. Prostate is approximately 35 grams, no nodules are appreciated. Seminal vesicles are normal.   Laboratory Data: Lab Results  Component Value Date   WBC 9.0 02/07/2015   HGB 14.4 02/07/2015   HCT 44.1 01/16/2016   MCV 91.7 02/07/2015   PLT 180 02/07/2015    Lab Results  Component Value Date   CREATININE 1.06 02/07/2015    PSA history:  Less than 0.1 ng/mL on 04/04/2014  Less than 0.1 ng/mL on 10/04/2014  0.3 ng/mL on 01/28/2015  0.1 ng/mL on 01/16/2016   Lab Results  Component Value Date   HGBA1C 6.2 (H) 05/21/2015   Pertinent Imaging Results for Eddie Carter, Eddie Carter (MRN [id number]) as of 02/23/2017 08:12  Ref. Range 02/18/2017 16:54  Scan Result Unknown 15    Assessment & Plan:    1. BPH with LUTS  - IPSS score is 11/3, it is improving  - Continue conservative management, avoiding bladder  irritants and timed voiding's  - most bothersome symptoms is/are incontinence  - Continue tamsulosin 0.4 mg daily and dutasteride 0.5 mg daily; refills given  - RTC in 12 months for IPSS, PVR and exam   2. Urinary incontinence  - PVR is 15 mL  - continue Vesicare 10 mg daily  - discussed UDS very briefly - patient will schedule another appointment to allow more time for discussion  - RTC in one year for I PSS and PVR  3. Hypogonadism  - not on therapy at this time  4. Erectile dysfunction  - not sexually active at this time  Return for patient to call and make another appointment.  Zara Council, [facility] [address] [place],  [address] [phone]

## 2017-02-18 NOTE — Patient Instructions (Addendum)
Urodynamic Testing What is urodynamic testing? Urodynamic testing is a set of tests and X-rays. These tests help to find out how well your bladder and the tube that empties your bladder (urethra) are working. Why do I need this testing? You may need these tests if you:  Are leaking pee (urine).  Have trouble starting or stopping peeing (urination).  Pee often or it hurts to pee.  Have urinary tract infections often.  Are not able to empty your bladder.  Have strong urges to pee.  Have a weak flow of pee. How are the tests done? The tests may be done in one visit or may be done over a few visits. You may be given medicine that fights germs (antibiotic medicine) to prevent infection. Ask your doctor if you should:  Stop taking any of your regular medicines.  Arrive for the test having to pee. The tests done may measure:  How much you pee and how long it takes.  How much pee is left in your bladder after you pee.  How much pressure there is in your bladder before you pee and as you pee.  The activity of the nerves and muscles in your bladder and in the tube that empties your bladder. What are the risks of the testing? The tests are usually safe. Problems can happen. You may have:  Discomfort.  The urge to pee often.  Bleeding.  Infection.  An allergic reaction to medicines used during the tests. What happens after the testing?  You should be able to go home right away.  You can do your usual activities.  You may be told to drink a tall glass of water every 30 minutes. Do this for the first 2 hours you are home.  Taking a warm bath or using a warm towel may relieve any discomfort. Let your doctor know if you have:  Pain.  Blood in your pee.  Chills.  Fever. What do my test results mean? Talk about the results of your tests with your doctor. These test results can help your doctor figure out how well your bladder and the tube that empties your bladder are  working. Your results and your symptoms can help your doctor find what might be causing your problems. This information is not intended to replace advice given to you by your health care provider. Make sure you discuss any questions you have with your health care provider. Document Released: 10/22/2008 Document Revised: 10/06/2016 Document Reviewed: 02/19/2014 Elsevier Interactive Patient Education  2017 [place] What is urodynamic testing? Urodynamic tests are done to determine how well your lower urinary tract is working. The lower urinary tract includes your bladder and the tube that empties your bladder (urethra). When your kidneys filter your blood, urine is stored in your bladder until you feel the urge to pass urine (urinate). Urination requires coordination between the nerves and muscles of your bladder and urethra. When your lower urinary tract is working well, you should be able to:  Start urinating when your bladder is full.  Empty your bladder completely.  Control the flow of your urine. Why do I need urodynamic testing? You may need urodynamic testing if you:  Are leaking urine (incontinence).  Have problems starting or stopping your urine flow.  Have frequent or painful urination.  Have frequent urinary tract infections.  Cannot empty your bladder completely.  Have strong urges to pass urine (urgency).  Have a weak flow of urine. How is urodynamic  testing done? Urodynamic tests may be done separately or all during one testing visit. These tests may be done at your health care provider's office, a clinic, or a hospital. You may be given an antibiotic medicine before or after testing to prevent infection. Ask your health care provider if you should:  Stop taking any of your regular medicines.  Arrive for the test with a full bladder. The urodynamic tests you may have done include: Uroflowmetry  This test measures how much urine you pass and  how long it takes to pass.  You sit on a special toilet to urinate.  The toilet measures the volume and the time of your urine flow.  These measurements are sent to a computer that creates a graph of your urine flow. Postvoid residual measurement  This test measures how much urine is left in your bladder after you urinate.  It uses sound waves (ultrasound) to create an image of your bladder.  The test can also be done by inserting a thin, flexible tube (catheter) into your bladder after you urinate.  Remaining urine is measured in milliliters (mL). If you have more than 100 mL left in your bladder after you urinate, your bladder is not emptying as it should. Cystometric testing  This test uses a special bladder catheter that can measure pressure.  A numbing medicine (local anesthetic) may be used.  First, a normal catheter is used to empty your bladder completely.  Then the measuring catheter is placed, and your bladder is filled with warm, germ-free (sterile) water.  Pressure measurements will be taken:  As your bladder fills.  When you feel the need to urinate.  As your bladder is emptied.  You may be asked to cough or bear down to check for leakage.  In some cases, your bladder may be filled with a material that shows up on X-rays (contrast material) so that X-ray pictures can be taken during the test. Electromyography  This test measures the electrical activity of the nerves and muscles of your bladder and the opening of your urethra.  It tells how well your nerves are communicating with your muscles.  Sticky patches are placed near your rectum and urethra to measure electrical activity. What are the risks of this testing? Generally, these tests are safe. However, problems can occur and include:  Discomfort.  Frequent urge to urinate.  Bleeding.  Infection.  An allergic reaction to contrast material, if contrast material is used. What happens after the  testing?  You should be able to go home right away and do your usual activities.  You may be instructed to drink a tall glass of water every 30 minutes for the first 2 hours you are home.  Taking a warm bath or using a warm compress may relieve any discomfort near your urethra. Let your health care provider know if you have:  Pain.  Blood in your urine.  Chills.  Fever. What do my results mean? Discuss the results of your urodynamic tests with your health care provider. Your health care provider will use the results of these and other tests, along with your signs and symptoms, to make a diagnosis. Some common causes for abnormal results from urodynamic tests include:  Enlarged prostate in men.  Overactive bladder.  Urinary tract infection.  Nervous system diseases.  Spinal cord damage. This information is not intended to replace advice given to you by your health care provider. Make sure you discuss any questions you have with your  health care provider. Document Released: 09/06/2007 Document Revised: 10/06/2016 Document Reviewed: 02/19/2014 Elsevier Interactive Patient Education  2017 [employer].

## 2017-03-22 ENCOUNTER — Ambulatory Visit (INDEPENDENT_AMBULATORY_CARE_PROVIDER_SITE_OTHER): Payer: Medicare Other | Admitting: Specialist

## 2017-03-23 ENCOUNTER — Encounter (INDEPENDENT_AMBULATORY_CARE_PROVIDER_SITE_OTHER): Payer: Self-pay | Admitting: Specialist

## 2017-03-23 ENCOUNTER — Ambulatory Visit (INDEPENDENT_AMBULATORY_CARE_PROVIDER_SITE_OTHER): Payer: Managed Care, Other (non HMO)

## 2017-03-23 ENCOUNTER — Ambulatory Visit (INDEPENDENT_AMBULATORY_CARE_PROVIDER_SITE_OTHER): Payer: Managed Care, Other (non HMO) | Admitting: Specialist

## 2017-03-23 VITALS — BP 124/75 | HR 77 | Ht 72.0 in | Wt 290.0 lb

## 2017-03-23 DIAGNOSIS — R2681 Unsteadiness on feet: Secondary | ICD-10-CM | POA: Diagnosis not present

## 2017-03-23 DIAGNOSIS — M545 Low back pain: Secondary | ICD-10-CM | POA: Diagnosis not present

## 2017-03-23 DIAGNOSIS — R29898 Other symptoms and signs involving the musculoskeletal system: Secondary | ICD-10-CM

## 2017-03-23 NOTE — Progress Notes (Signed)
Office Visit Note   Patient: Eddie Carter           Date of Birth: 1941/03/26           MRN: [id number] Visit Date: 03/23/2017              Requested by: [name], MD [address] [place], [place] [address] PCP: [name], MD   Assessment & Plan: Visit Diagnoses:  1. Low back pain, unspecified back pain laterality, unspecified chronicity, with sciatica presence unspecified   2. Weakness of both lower extremities   3. Unsteady gait     Plan:   Follow-Up Instructions: Return in about 3 weeks (around 04/13/2017).   Orders:  Orders Placed This Encounter  Procedures  . XR Lumbar Spine 2-3 Views  . MR Lumbar Spine W Wo Contrast  . Ambulatory referral to Neurology   No orders of the defined types were placed in this encounter.     Procedures: No procedures performed   Clinical Data: No additional findings.   Subjective: Chief Complaint  Patient presents with  . Lower Back - Follow-up, Pain    HPI  Review of Systems  Constitutional: Positive for activity change.  Respiratory: Negative.   Cardiovascular: Negative.   Gastrointestinal: Negative.   Musculoskeletal: Positive for back pain and gait problem.  Neurological: Positive for weakness and numbness.  Psychiatric/Behavioral: Negative.      Objective: Vital Signs: BP 124/75 (BP Location: Left Arm, Patient Position: Sitting)   Pulse 77   Ht 6' (1.829 m)   Wt 290 lb (131.5 kg)   BMI 39.33 kg/m   Physical Exam  Constitutional: He is oriented to person, place, and time. He appears well-nourished. No distress.  HENT:  Head: Normocephalic and atraumatic.  Eyes: Pupils are equal, round, and reactive to light.  Neck: Normal range of motion.  Musculoskeletal:  Patient sitting in a wheelchair. Negative logroll bilateral hips. Nontender over the bilateral hip greater trochanters. Negative straight leg raise. He does have right greater than left quad weakness. Trace bilateral anterior tib and  gastroc weakness. Bilateral calves are nontender.  Neurological: He is alert and oriented to person, place, and time.  Skin: Skin is warm and dry.  Psychiatric: He has a normal mood and affect.    Ortho Exam  Specialty Comments:  No specialty comments available.  Imaging: No results found.   PMFS History: Patient Active Problem List   Diagnosis Date Noted  . Cephalalgia 01/29/2016  . Adiposity 01/29/2016  . Hypertriglyceridemia 01/29/2016  . H/O respiratory system disease 01/29/2016  . H/O erectile dysfunction 01/29/2016  . Cerebrovascular accident, old 01/29/2016  . Diabetes mellitus ([place]) 01/29/2016  . Chronic obstructive pulmonary disease ([place]) 01/29/2016  . Benign fibroma of prostate 01/29/2016  . Chronic pain 01/29/2016  . 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 Abnormal MRI, lumbar spine 01/29/2016  . Chronic low back pain (Location of Primary Source of Pain) (Bilateral) (L>R) 01/29/2016  . Chronic lower extremity pain (Location of Secondary source of pain) (Bilateral) (L>R) 01/29/2016  . Long term current use of opiate analgesic 01/29/2016  . Long term prescription opiate use 01/29/2016  . Opiate use 01/29/2016  . Encounter for therapeutic drug level monitoring 01/29/2016  . Encounter for pain management planning 01/29/2016  . Chronic lumbar radicular pain (Location of Secondary source of pain) (Bilateral) (L4 Dermatome) (L>R) 01/29/2016  . Chronic hip pain (Location of Tertiary source of pain) (Bilateral) (L>R) 01/29/2016  . Chronic knee pain (  Bilateral) (L>R) 01/29/2016  . Chronic anticoagulation (Plavix) 01/29/2016  . Erectile dysfunction of organic origin 01/22/2016  . BPH with obstruction/lower urinary tract symptoms 07/24/2015  . Hypogonadism in male 07/24/2015  . HNP (herniated nucleus pulposus), lumbar 11/05/2014    Class: Acute  . Herniated nucleus pulposus, lumbar 11/05/2014  . Pulmonary nodules 10/25/2013  . ILD (interstitial lung disease) ([place])  08/22/2013  . Traumatic tear of lateral meniscus of right knee   . Medial meniscus, posterior horn derangement   . COPD (chronic obstructive pulmonary disease) ([place])   . Shortness of breath   . Sleep apnea   . Stroke ([place])   . Arthritis   . GERD (gastroesophageal reflux disease)   . History of GI bleed   . HOH (hard of hearing)   . Full dentures   . Complication of anesthesia    Past Medical History:  Diagnosis Date  . Arthritis   . Asthma   . BPH (benign prostatic hyperplasia)   . 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 Complication of anesthesia    after septoplasty and uvulectomy-was in icu- [facility] - 10 yrs. ago  . COPD (chronic obstructive pulmonary disease) ([place])   . Diabetes mellitus without complication ([place])   . ED (erectile dysfunction)   . Full dentures   . GERD (gastroesophageal reflux disease)    no meds  . Headache    difficulty with headaches- 1950's , again in 1967, 2008- again problems with migrraines   . History of GI bleed   . History of urethral stricture   . HOH (hard of hearing)   . Hypogonadism in male   . Joint pain   . Medial meniscus, posterior horn derangement    left knee  . Obesity   . Pneumonia 2014   [facility]  . Shortness of breath   . Skin cancer   . Sleep apnea    uses a cpap, most nights   . Stroke [facility]) 1995   some speech and thought processes slow  . Traumatic tear of lateral meniscus of right knee    right knee  . Urinary frequency   . Urinary incontinence     Family History  Problem Relation Age of Onset  . Cancer Mother   . Heart attack Father   . Heart disease    . Arthritis    . Prostate cancer Neg Hx   . Kidney disease Neg Hx     Past Surgical History:  Procedure Laterality Date  . APPENDECTOMY  1963  . CARPAL TUNNEL RELEASE     left  . COLONOSCOPY    . COLONOSCOPY WITH PROPOFOL N/A 12/07/2016   Procedure: COLONOSCOPY WITH PROPOFOL;  Surgeon: Lollie Sails, MD;  Location: [facility] ENDOSCOPY;  Service:  Endoscopy;  Laterality: N/A;  . EYE SURGERY     foreign body removed fr. eye, ? side   . FOOT FOREIGN BODY REMOVAL  1976   left foot -multiple pieces glass  . HEMORROIDECTOMY  1995  . KNEE ARTHROSCOPY Bilateral 01/17/2013   Procedure: ARTHROSCOPY KNEE BILATERAL WITH MEDIAL AND LATERAL MENISECTOMIES;  Surgeon: Lorn Junes, MD;  Location: [place];  Service: Orthopedics;  Laterality: Bilateral;  . LUMBAR LAMINECTOMY N/A 11/05/2014   Procedure: LEFT L3-4 MICRODISCECTOMY;  Surgeon: Jessy Oto, MD;  Location: [place];  Service: Orthopedics;  Laterality: N/A;  . LUMBAR LAMINECTOMY/DECOMPRESSION MICRODISCECTOMY N/A 02/12/2015   Procedure: RE-DO LEFT L3-4 MICRODISCECTOMY;  Surgeon: Jessy Oto,  MD;  Location: [place];  Service: Orthopedics;  Laterality: N/A;  . NASAL SEPTUM SURGERY  1995  . TONSILLECTOMY    . UPPER GI ENDOSCOPY    . UVULOPALATOPHARYNGOPLASTY (UPPP)/TONSILLECTOMY/SEPTOPLASTY  1995   same time with septoplasty-ended up ICU almost trached   Social History   Occupational History  .      register of deeds   Social History Main Topics  . Smoking status: Former Smoker    Packs/day: 1.50    Years: 40.00    Types: Cigarettes    Quit date: 01/14/1996  . Smokeless tobacco: Never Used  . Alcohol use Yes     Comment: wine in a month  . Drug use: No  . Sexual activity: Not on file

## 2017-04-22 ENCOUNTER — Encounter (INDEPENDENT_AMBULATORY_CARE_PROVIDER_SITE_OTHER): Payer: Self-pay | Admitting: *Deleted

## 2017-04-29 ENCOUNTER — Ambulatory Visit (INDEPENDENT_AMBULATORY_CARE_PROVIDER_SITE_OTHER): Payer: Medicare Other

## 2017-04-29 ENCOUNTER — Ambulatory Visit (INDEPENDENT_AMBULATORY_CARE_PROVIDER_SITE_OTHER): Payer: Managed Care, Other (non HMO)

## 2017-04-29 ENCOUNTER — Ambulatory Visit (INDEPENDENT_AMBULATORY_CARE_PROVIDER_SITE_OTHER): Payer: Self-pay

## 2017-04-29 ENCOUNTER — Encounter (INDEPENDENT_AMBULATORY_CARE_PROVIDER_SITE_OTHER): Payer: Self-pay | Admitting: Specialist

## 2017-04-29 ENCOUNTER — Ambulatory Visit (INDEPENDENT_AMBULATORY_CARE_PROVIDER_SITE_OTHER): Payer: Managed Care, Other (non HMO) | Admitting: Specialist

## 2017-04-29 VITALS — BP 113/77 | HR 82 | Ht 72.0 in | Wt 260.0 lb

## 2017-04-29 DIAGNOSIS — M5136 Other intervertebral disc degeneration, lumbar region: Secondary | ICD-10-CM

## 2017-04-29 DIAGNOSIS — M25511 Pain in right shoulder: Secondary | ICD-10-CM

## 2017-04-29 DIAGNOSIS — R29898 Other symptoms and signs involving the musculoskeletal system: Secondary | ICD-10-CM | POA: Diagnosis not present

## 2017-04-29 DIAGNOSIS — M25512 Pain in left shoulder: Secondary | ICD-10-CM

## 2017-04-29 MED ORDER — METHYLPREDNISOLONE 4 MG PO TABS: ORAL_TABLET | ORAL | 0 refills | Status: DC

## 2017-04-29 MED ORDER — TRAMADOL HCL 50 MG PO TABS: 50.0000 mg | ORAL_TABLET | Freq: Four times a day (QID) | ORAL | 0 refills | Status: DC | PRN

## 2017-04-29 NOTE — Progress Notes (Signed)
Office Visit Note   Patient: DEUNDRA BARD           Date of Birth: 10-07-1941           MRN: [id number] Visit Date: 04/29/2017              Requested by: [name], MD [address] [place], [place] [address] PCP: [name], MD   Assessment & Plan: Visit Diagnoses:  1. Right shoulder pain, unspecified chronicity   2. Left shoulder pain, unspecified chronicity   3. Weakness of left leg   4. 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 He is nearly 2 year post left L3-4 discectomy and redo discectomy for HNP. Chronic residual left quad weakness. Over last 3 months with increasing right greater than left shoulder pain post fall. He is having numerous falls. Poor memory, decreased cognitive skills. Weakness in the legs requires use of both arms to walk and balance. Needs to bear weight over his arms when walking so that his capacity to stand and walk is limited with the combined upper extremity injuries and left leg residual weakess in his quadriceps muscle that hold the left knee in extension. A mobility device with hand controls would assist him in mobility over greater distances. A hospital bed would improve ability to get in and out of bed.A Hoyer lift too may also be of benefit to getting in and out of bed. Will order [facility] for evaluation of his home situation and needs would be helpful to accomplish making his ability to transfer and ambulate. Obtain MRI of the right shoulder to rule out a RCT.   Plan:Avoid overhead lifting and overhead use of the arms. Pillows to keep from sleeping directly on the shoulders Limited lifting to less than 10 lbs. Ice or heat for relief. Tylenol arthritis strength up to 3 or 4 times per day, be careful not to use in excess as they place burdens on the liver. Stretching exercise help and strengthening is helpful to build endurance. We will schedule for an MRI as the injections are not  helping. Weakness in the legs requires use of both arms to walk and balance. Needs to bear weight over his arms when walking so that his capacity to stand and walk is limited with the combined upper extremity injuries and left leg residual weakess in his quadriceps muscle that hold the left knee in extension. A mobility device with hand controls would assist him in  Mobility over greater distances.  A hospital bed would improve ability to get in and out of bed. A Hoyer lift may also be of benefit to getting in and out of bed. HHN for evaluation of his home situation and needs would be helpful to accomplish making his ability to transfer and  Ambulate   Follow-Up Instructions: Return in about 2 weeks (around 05/13/2017).   Orders:  Orders Placed This Encounter  Procedures  . XR Shoulder Right  . XR Shoulder Left  . MR SHOULDER RIGHT W CONTRAST  . Skilled nursing visits   Meds ordered this encounter  Medications  . traMADol (ULTRAM) 50 MG tablet    Sig: Take 1 tablet (50 mg total) by mouth every 6 (six) hours as needed.    Dispense:  40 tablet    Refill:  0  . methylPREDNISolone (MEDROL) 4 MG tablet    Sig: Take one tablet po daily for 2 weeks  then take 1/2 tablet daily till out.    Dispense:  21 tablet    Refill:  0      Procedures: No procedures performed   Clinical Data: No additional findings.   Subjective: No chief complaint on file.   76 year old male right handed with 3 month history of right shoulder pain. He apparently fell in a grocery store in [place], had onset of the right shoulder pain. Seen by Carlynn Spry, PA-C in [place], [place].. Has undergone repetitive injections. Still experiencing  Severe pain in the right shoulder. Pain with overhead use of the right arm. Pain with attempting to sleep on the right shoulder. No numbness or paresthesias. No bowel or bladder difficulties.     Review of Systems  Constitutional: Negative.   HENT:  Negative.   Eyes: Negative.   Respiratory: Negative.   Cardiovascular: Negative.   Gastrointestinal: Negative.   Endocrine: Negative.   Genitourinary: Negative.   Musculoskeletal: Negative.   Skin: Negative.   Allergic/Immunologic: Negative.   Neurological: Negative.   Hematological: Negative.   Psychiatric/Behavioral: Negative.      Objective: Vital Signs: BP 113/77 (BP Location: Left Arm, Patient Position: Sitting)   Pulse 82   Ht 6' (1.829 m)   Wt 260 lb (117.9 kg)   BMI 35.26 kg/m   Physical Exam  Constitutional: He is oriented to person, place, and time. He appears well-developed and well-nourished.  HENT:  Head: Normocephalic and atraumatic.  Eyes: EOM are normal. Pupils are equal, round, and reactive to light.  Neck: Normal range of motion. Neck supple.  Pulmonary/Chest: Effort normal and breath sounds normal.  Abdominal: Soft. Bowel sounds are normal.  Neurological: He is alert and oriented to person, place, and time.  Skin: Skin is warm and dry.  Psychiatric: He has a normal mood and affect. His behavior is normal. Judgment and thought content normal.    Right Shoulder Exam   Tenderness  The patient is experiencing tenderness in the biceps tendon.  Range of Motion  Active Abduction: abnormal  Passive Abduction: abnormal  Extension: abnormal  External Rotation: abnormal  Internal Rotation 0 degrees: L5  Internal Rotation 90 degrees: 50   Muscle Strength  Abduction: 3/5  Internal Rotation: 4/5  External Rotation: 4/5  Supraspinatus: 3/5  Subscapularis: 3/5  Biceps: 3/5   Tests  Apprehension: positive Drop Arm: positive Impingement: positive  Other  Erythema: absent Scars: absent Sensation: normal Pulse: present  Comments:  Effusion right shoulder   Left Shoulder Exam  Left shoulder exam is normal.  Range of Motion  Active Abduction: normal  Passive Abduction: normal  Extension: normal  Forward Flexion: normal  External Rotation:  normal  Internal Rotation 0 degrees: normal  Internal Rotation 90 degrees: normal   Muscle Strength  Abduction: 5/5  Internal Rotation: 5/5  External Rotation: 5/5  Supraspinatus: 5/5  Subscapularis: 5/5  Biceps: 5/5   Tests  Apprehension: negative Cross Arm: negative Drop Arm: negative Hawkin's test: negative Impingement: negative Sulcus: absent  Other  Erythema: present Scars: absent       Specialty Comments:  No specialty comments available.  Imaging: Xr Shoulder Left  Result Date: 04/29/2017 AP and axillary lateral and outlet views with type II acromion, minimal degenerative changes of the left glenoid, the humeral head with minimal spur inferomedial, mild sclerosis of the insertion site of the supraspinatous at the sulcus between the superolateral humeral head and the greater tuberosity. Spur off the anterior aspect of the acromion  process but the subacromial space is well maintained.  Xr Shoulder Right  Result Date: 04/29/2017 AP and axillary lateral and outlet views with type II nearly III acromion, mild degenerative changes of the right glenoid, the humeral head with moderate spur inferomedial, mild sclerosis of the insertion site of the supraspinatous at the sulcus between the superolateral humeral head and the greater tuberosity. Spur off the anterior aspect of the acromion process but the subacromial space is adequate at 38mm.    PMFS History: Patient Active Problem List   Diagnosis Date Noted  . Cephalalgia 01/29/2016  . Adiposity 01/29/2016  . Hypertriglyceridemia 01/29/2016  . H/O respiratory system disease 01/29/2016  . H/O erectile dysfunction 01/29/2016  . Cerebrovascular accident, old 01/29/2016  . Diabetes mellitus ([place]) 01/29/2016  . Chronic obstructive pulmonary disease ([place]) 01/29/2016  . Benign fibroma of prostate 01/29/2016  . Chronic pain 01/29/2016  . 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 Abnormal MRI, lumbar spine 01/29/2016  .  Chronic low back pain (Location of Primary Source of Pain) (Bilateral) (L>R) 01/29/2016  . Chronic lower extremity pain (Location of Secondary source of pain) (Bilateral) (L>R) 01/29/2016  . Long term current use of opiate analgesic 01/29/2016  . Long term prescription opiate use 01/29/2016  . Opiate use 01/29/2016  . Encounter for therapeutic drug level monitoring 01/29/2016  . Encounter for pain management planning 01/29/2016  . Chronic lumbar radicular pain (Location of Secondary source of pain) (Bilateral) (L4 Dermatome) (L>R) 01/29/2016  . Chronic hip pain (Location of Tertiary source of pain) (Bilateral) (L>R) 01/29/2016  . Chronic knee pain (Bilateral) (L>R) 01/29/2016  . Chronic anticoagulation (Plavix) 01/29/2016  . Erectile dysfunction of organic origin 01/22/2016  . BPH with obstruction/lower urinary tract symptoms 07/24/2015  . Hypogonadism in male 07/24/2015  . HNP (herniated nucleus pulposus), lumbar 11/05/2014    Class: Acute  . Herniated nucleus pulposus, lumbar 11/05/2014  . Pulmonary nodules 10/25/2013  . ILD (interstitial lung disease) ([employer]) 08/22/2013  . Traumatic tear of lateral meniscus of right knee   . Medial meniscus, posterior horn derangement   . COPD (chronic obstructive pulmonary disease) ([place])   . Shortness of breath   . Sleep apnea   . Stroke ([place])   . Arthritis   . GERD (gastroesophageal reflux disease)   . History of GI bleed   . HOH (hard of hearing)   . Full dentures   . Complication of anesthesia    Past Medical History:  Diagnosis Date  . Arthritis   . Asthma   . BPH (benign prostatic hyperplasia)   . 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 Complication of anesthesia    after septoplasty and uvulectomy-was in icu- [facility] - 10 yrs. ago  . COPD (chronic obstructive pulmonary disease) ([place])   . Diabetes mellitus without complication ([place])   . ED (erectile dysfunction)   . Full dentures   . GERD (gastroesophageal reflux disease)     no meds  . Headache    difficulty with headaches- 1950's , again in 1967, 2008- again problems with migrraines   . History of GI bleed   . History of urethral stricture   . HOH (hard of hearing)   . Hypogonadism in male   . Joint pain   . Medial meniscus, posterior horn derangement    left knee  . Obesity   . Pneumonia 2014   [facility]  . Shortness of breath   . Skin cancer   . Sleep  apnea    uses a cpap, most nights   . Stroke [facility]) 1995   some speech and thought processes slow  . Traumatic tear of lateral meniscus of right knee    right knee  . Urinary frequency   . Urinary incontinence     Family History  Problem Relation Age of Onset  . Cancer Mother   . Heart attack Father   . Heart disease Unknown   . Arthritis Unknown   . Prostate cancer Neg Hx   . Kidney disease Neg Hx     Past Surgical History:  Procedure Laterality Date  . APPENDECTOMY  1963  . CARPAL TUNNEL RELEASE     left  . COLONOSCOPY    . COLONOSCOPY WITH PROPOFOL N/A 12/07/2016   Procedure: COLONOSCOPY WITH PROPOFOL;  Surgeon: Lollie Sails, MD;  Location[facility] ENDOSCOPY;  Service: Endoscopy;  Laterality: N/A;  . EYE SURGERY     foreign body removed fr. eye, ? side   . FOOT FOREIGN BODY REMOVAL  1976   left foot -multiple pieces glass  . HEMORROIDECTOMY  1995  . KNEE ARTHROSCOPY Bilateral 01/17/2013   Procedure: ARTHROSCOPY KNEE BILATERAL WITH MEDIAL AND LATERAL MENISECTOMIES;  Surgeon: Lorn Junes, MD;  Location: [place];  Service: Orthopedics;  Laterality: Bilateral;  . LUMBAR LAMINECTOMY N/A 11/05/2014   Procedure: LEFT L3-4 MICRODISCECTOMY;  Surgeon: Jessy Oto, MD;  Location: [place];  Service: Orthopedics;  Laterality: N/A;  . LUMBAR LAMINECTOMY/DECOMPRESSION MICRODISCECTOMY N/A 02/12/2015   Procedure: RE-DO LEFT L3-4 MICRODISCECTOMY;  Surgeon: Jessy Oto, MD;  Location: [place];  Service: Orthopedics;  Laterality: N/A;  . NASAL SEPTUM SURGERY  1995  . TONSILLECTOMY    .  UPPER GI ENDOSCOPY    . UVULOPALATOPHARYNGOPLASTY (UPPP)/TONSILLECTOMY/SEPTOPLASTY  1995   same time with septoplasty-ended up ICU almost trached   Social History   Occupational History  .      register of deeds   Social History Main Topics  . Smoking status: Former Smoker    Packs/day: 1.50    Years: 40.00    Types: Cigarettes    Quit date: 01/14/1996  . Smokeless tobacco: Never Used  . Alcohol use Yes     Comment: wine in a month  . Drug use: No  . Sexual activity: Not on file

## 2017-04-29 NOTE — Patient Instructions (Addendum)
Avoid overhead lifting and overhead use of the arms. Pillows to keep from sleeping directly on the shoulders Limited lifting to less than 10 lbs. Ice or heat for relief. Tylenol arthritis strength up to 3 or 4 times per day, be careful not to use in excess as they place burdens on the liver. Stretching exercise help and strengthening is helpful to build endurance. We will schedule for an MRI as the injections are not helping. Weakness in the legs requires use of both arms to walk and balance. Needs to bear weight over his arms when walking so that his capacity to stand and walk is limited with the combined upper extremity injuries and left leg residual weakess in his quadriceps muscle that hold the left knee in extension. A mobility device with hand controls would assist him in  Mobility over greater distances.  A hospital bed would improve ability to get in and out of bed. A Hoyer lift may also be of benefit to getting in and out of bed. HHN for evaluation of his home situation and needs would be helpful to accomplish making his ability to transfer and  Ambulate

## 2017-05-17 ENCOUNTER — Telehealth (INDEPENDENT_AMBULATORY_CARE_PROVIDER_SITE_OTHER): Payer: Self-pay | Admitting: Specialist

## 2017-05-17 NOTE — Telephone Encounter (Signed)
Patient called advised he is having his MRI Thursday 05/20/17 at 2:30pm and need an appointment for Dr Louanne Skye to review it with him. The number to contact patient is [phone]

## 2017-05-17 NOTE — Telephone Encounter (Signed)
Dr. Nitka patient. 

## 2017-05-20 ENCOUNTER — Ambulatory Visit
Admission: RE | Admit: 2017-05-20 | Discharge: 2017-05-20 | Disposition: A | Discharge status: Home / Self Care | Payer: Managed Care, Other (non HMO) | Source: Ambulatory Visit | Attending: Specialist | Admitting: Specialist

## 2017-05-20 DIAGNOSIS — M25511 Pain in right shoulder: Secondary | ICD-10-CM

## 2017-05-21 NOTE — Telephone Encounter (Signed)
lmom that I was looking for a cancellation and will call as soon as we ave one available.

## 2017-05-27 ENCOUNTER — Encounter (INDEPENDENT_AMBULATORY_CARE_PROVIDER_SITE_OTHER): Payer: Self-pay | Admitting: Specialist

## 2017-05-27 ENCOUNTER — Ambulatory Visit (INDEPENDENT_AMBULATORY_CARE_PROVIDER_SITE_OTHER): Payer: Managed Care, Other (non HMO) | Admitting: Specialist

## 2017-05-27 ENCOUNTER — Ambulatory Visit (INDEPENDENT_AMBULATORY_CARE_PROVIDER_SITE_OTHER): Payer: Managed Care, Other (non HMO)

## 2017-05-27 VITALS — BP 118/73 | HR 73 | Ht 72.0 in | Wt 260.0 lb

## 2017-05-27 DIAGNOSIS — M5416 Radiculopathy, lumbar region: Secondary | ICD-10-CM | POA: Diagnosis not present

## 2017-05-27 DIAGNOSIS — M1711 Unilateral primary osteoarthritis, right knee: Secondary | ICD-10-CM | POA: Diagnosis not present

## 2017-05-27 DIAGNOSIS — M25561 Pain in right knee: Secondary | ICD-10-CM

## 2017-05-27 DIAGNOSIS — M5136 Other intervertebral disc degeneration, lumbar region: Secondary | ICD-10-CM | POA: Diagnosis not present

## 2017-05-27 DIAGNOSIS — M75121 Complete rotator cuff tear or rupture of right shoulder, not specified as traumatic: Secondary | ICD-10-CM

## 2017-05-27 NOTE — Progress Notes (Signed)
Office Visit Note   Patient: Eddie Carter           Date of Birth: 1940-12-26           MRN: [id number] Visit Date: 05/27/2017              Requested by: [name], MD [address] [place], [place] [address] PCP: [name], MD   Assessment & Plan: Visit Diagnoses:  1. Right knee pain, unspecified chronicity   2. Unilateral primary osteoarthritis, right knee   3. Complete tear of right rotator cuff   4. Degenerative disc disease, lumbar   5. Chronic left lumbar radiculopathy     Plan:Knee is suffering from osteoarthritis, only real proven treatments are Weight loss, NSIADs like tylenol and exercise. Well padded shoes help. Ice the knee 2-3 times a day 15-20 mins at a time. Dr. Marlou Sa indicates that the MRI is suspicious for a more chronic condition of the right shoulder and is concerned that the rotator cuff may not be reconstructable. He indicates that therapy is the best current option and if the pain does not improve then he may require a reverse right shoulder replacement.  Follow-Up Instructions: Return in about 4 weeks (around 06/24/2017).   Orders:  Orders Placed This Encounter  Procedures  . Large Joint Injection/Arthrocentesis  . XR Knee 1-2 Views Right  . Ambulatory referral to Home Health   No orders of the defined types were placed in this encounter.     Procedures: Large Joint Inj Date/Time: 05/27/2017 5:55 PM Performed by: Jessy Oto Authorized by: Jessy Oto   Consent Given by:  Patient Site marked: the procedure site was marked   Timeout: prior to procedure the correct patient, procedure, and site was verified   Indications:  Pain and joint swelling Location:  Knee Site:  R knee Prep: patient was prepped and draped in usual sterile fashion   Needle Size:  25 G Needle Length:  1.5 inches Approach:  Anterolateral Ultrasound Guidance: No   Fluoroscopic Guidance: No   Arthrogram: No   Medications:  4 mL bupivacaine 0.25 %; 40 mg  methylPREDNISolone acetate 40 MG/ML Aspiration Attempted: No   Patient tolerance:  Patient tolerated the procedure well with no immediate complications  Bandaid applied.      Clinical Data: No additional findings.   Subjective: Chief Complaint  Patient presents with  . Right Shoulder - Follow-up    MRI Review---Right shoulder    76 year old male with history of lumbar laminectomy and redo laminectomy left L3-4 with residual weakness left quadriceps.He has been having falls due to the left leg weakness and more recently right knee pain and right shoulder pain. The right shoulder pain is severe, pain with attempted overhead raising of the right shoulder. He is unable to raise the right arm above shoulder height and is weak in using the right arm. MRI has been done. He notes today that the right knee is painful and is painful with  ROM and reaching with the right arm. No numbness or paresthesias. No bowel or bladder problems.    Review of Systems  Constitutional: Negative.   HENT: Negative.   Eyes: Negative.   Respiratory: Negative.   Cardiovascular: Negative.   Gastrointestinal: Negative.   Endocrine: Negative.   Genitourinary: Negative.   Musculoskeletal: Negative.   Skin: Negative.   Allergic/Immunologic: Negative.   Neurological: Negative.   Hematological: Negative.   Psychiatric/Behavioral: Negative.  Objective: Vital Signs: BP 118/73 (BP Location: Left Arm, Patient Position: Sitting)   Pulse 73   Ht 6' (1.829 m)   Wt 260 lb (117.9 kg)   BMI 35.26 kg/m   Physical Exam  Constitutional: He is oriented to person, place, and time. He appears well-developed and well-nourished.  HENT:  Head: Normocephalic and atraumatic.  Eyes: EOM are normal. Pupils are equal, round, and reactive to light.  Neck: Normal range of motion. Neck supple.  Pulmonary/Chest: Effort normal and breath sounds normal.  Abdominal: Soft. Bowel sounds are normal.  Neurological: He is  alert and oriented to person, place, and time.  Skin: Skin is warm and dry.  Psychiatric: He has a normal mood and affect. His behavior is normal. Judgment and thought content normal.    Back Exam   Tenderness  The patient is experiencing tenderness in the lumbar.  Muscle Strength  Right Quadriceps:  5/5  Left Quadriceps:  4/5  Right Hamstrings:  5/5  Left Hamstrings:  5/5   Other  Toe Walk: abnormal Heel Walk: abnormal Sensation: normal Erythema: no back redness Scars: absent   Right Shoulder Exam   Tenderness  The patient is experiencing tenderness in the acromioclavicular joint.  Range of Motion  Active Abduction:  50 abnormal  Passive Abduction:  90 abnormal  Extension: normal  Forward Flexion:  50 abnormal  External Rotation:  40 abnormal  Internal Rotation 0 degrees:  L3 abnormal  Internal Rotation 90 degrees:  30 abnormal   Muscle Strength  Abduction: 2/5  Internal Rotation: 4/5  External Rotation: 2/5  Supraspinatus: 2/5  Subscapularis: 5/5  Biceps: 5/5   Tests  Apprehension: positive Cross Arm: positive Drop Arm: positive Impingement: positive Sulcus: present  Other  Erythema: absent Scars: absent Sensation: normal Pulse: present      Specialty Comments:  No specialty comments available.  Imaging: Xr Knee 1-2 Views Right  Result Date: 05/27/2017 AP and lateral of the right knee with moderate spur off the superior pole of the right patella and scalloping of the retropatella surface. Xrays were non weight bearing mild narrowing both medial and lateral joint line.     PMFS History: Patient Active Problem List   Diagnosis Date Noted  . Cephalalgia 01/29/2016  . Adiposity 01/29/2016  . Hypertriglyceridemia 01/29/2016  . H/O respiratory system disease 01/29/2016  . H/O erectile dysfunction 01/29/2016  . Cerebrovascular accident, old 01/29/2016  . Diabetes mellitus ([place]) 01/29/2016  . Chronic obstructive pulmonary disease ([place])  01/29/2016  . Benign fibroma of prostate 01/29/2016  . Chronic pain 01/29/2016  . 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 Abnormal MRI, lumbar spine 01/29/2016  . Chronic low back pain (Location of Primary Source of Pain) (Bilateral) (L>R) 01/29/2016  . Chronic lower extremity pain (Location of Secondary source of pain) (Bilateral) (L>R) 01/29/2016  . Long term current use of opiate analgesic 01/29/2016  . Long term prescription opiate use 01/29/2016  . Opiate use 01/29/2016  . Encounter for therapeutic drug level monitoring 01/29/2016  . Encounter for pain management planning 01/29/2016  . Chronic lumbar radicular pain (Location of Secondary source of pain) (Bilateral) (L4 Dermatome) (L>R) 01/29/2016  . Chronic hip pain (Location of Tertiary source of pain) (Bilateral) (L>R) 01/29/2016  . Chronic knee pain (Bilateral) (L>R) 01/29/2016  . Chronic anticoagulation (Plavix) 01/29/2016  . Erectile dysfunction of organic origin 01/22/2016  . BPH with obstruction/lower urinary tract symptoms 07/24/2015  . Hypogonadism in male 07/24/2015  . HNP (herniated nucleus pulposus), lumbar  11/05/2014    Class: Acute  . Herniated nucleus pulposus, lumbar 11/05/2014  . Pulmonary nodules 10/25/2013  . ILD (interstitial lung disease) ([place]) 08/22/2013  . Traumatic tear of lateral meniscus of right knee   . Medial meniscus, posterior horn derangement   . COPD (chronic obstructive pulmonary disease) ([place])   . Shortness of breath   . Sleep apnea   . Stroke ([place])   . Arthritis   . GERD (gastroesophageal reflux disease)   . History of GI bleed   . HOH (hard of hearing)   . Full dentures   . Complication of anesthesia    Past Medical History:  Diagnosis Date  . Arthritis   . Asthma   . BPH (benign prostatic hyperplasia)   . 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 Complication of anesthesia    after septoplasty and uvulectomy-was in icu- [facility] - 10 yrs. ago  . COPD (chronic obstructive  pulmonary disease) ([place])   . Diabetes mellitus without complication ([place])   . ED (erectile dysfunction)   . Full dentures   . GERD (gastroesophageal reflux disease)    no meds  . Headache    difficulty with headaches- 1950's , again in 1967, 2008- again problems with migrraines   . History of GI bleed   . History of urethral stricture   . HOH (hard of hearing)   . Hypogonadism in male   . Joint pain   . Medial meniscus, posterior horn derangement    left knee  . Obesity   . Pneumonia 2014   [facility]  . Shortness of breath   . Skin cancer   . Sleep apnea    uses a cpap, most nights   . Stroke [facility]) 1995   some speech and thought processes slow  . Traumatic tear of lateral meniscus of right knee    right knee  . Urinary frequency   . Urinary incontinence     Family History  Problem Relation Age of Onset  . Cancer Mother   . Heart attack Father   . Heart disease Unknown   . Arthritis Unknown   . Prostate cancer Neg Hx   . Kidney disease Neg Hx     Past Surgical History:  Procedure Laterality Date  . APPENDECTOMY  1963  . CARPAL TUNNEL RELEASE     left  . COLONOSCOPY    . COLONOSCOPY WITH PROPOFOL N/A 12/07/2016   Procedure: COLONOSCOPY WITH PROPOFOL;  Surgeon: Lollie Sails, MD;  Location: [facility] ENDOSCOPY;  Service: Endoscopy;  Laterality: N/A;  . EYE SURGERY     foreign body removed fr. eye, ? side   . FOOT FOREIGN BODY REMOVAL  1976   left foot -multiple pieces glass  . HEMORROIDECTOMY  1995  . KNEE ARTHROSCOPY Bilateral 01/17/2013   Procedure: ARTHROSCOPY KNEE BILATERAL WITH MEDIAL AND LATERAL MENISECTOMIES;  Surgeon: Lorn Junes, MD;  Location: [place];  Service: Orthopedics;  Laterality: Bilateral;  . LUMBAR LAMINECTOMY N/A 11/05/2014   Procedure: LEFT L3-4 MICRODISCECTOMY;  Surgeon: Jessy Oto, MD;  Location: [place];  Service: Orthopedics;  Laterality: N/A;  . LUMBAR LAMINECTOMY/DECOMPRESSION MICRODISCECTOMY N/A 02/12/2015   Procedure:  RE-DO LEFT L3-4 MICRODISCECTOMY;  Surgeon: Jessy Oto, MD;  Location: [place];  Service: Orthopedics;  Laterality: N/A;  . NASAL SEPTUM SURGERY  1995  . TONSILLECTOMY    . UPPER GI ENDOSCOPY    . UVULOPALATOPHARYNGOPLASTY (UPPP)/TONSILLECTOMY/SEPTOPLASTY  1995   same  time with septoplasty-ended up ICU almost trached   Social History   Occupational History  .      register of deeds   Social History Main Topics  . Smoking status: Former Smoker    Packs/day: 1.50    Years: 40.00    Types: Cigarettes    Quit date: 01/14/1996  . Smokeless tobacco: Never Used  . Alcohol use Yes     Comment: wine in a month  . Drug use: No  . Sexual activity: Not on file

## 2017-05-27 NOTE — Telephone Encounter (Signed)
Patient coming in today @ 345 today to review MRI

## 2017-05-27 NOTE — Patient Instructions (Signed)
  Knee is suffering from osteoarthritis, only real proven treatments are Weight loss, NSIADs like tylenol and exercise. Well padded shoes help. Ice the knee 2-3 times a day 15-20 mins at a time. Dr. Marlou Sa indicates that the MRI is suspicious for a more chronic condition of the right shoulder and is concerned that the rotator cuff may not be reconstructable. He indicates that therapy is the best current option and if the pain does not improve then he may require a reverse right shoulder replacement.

## 2017-05-28 MED ORDER — BUPIVACAINE HCL 0.25 % IJ SOLN
4.0000 mL | INTRAMUSCULAR | Status: AC | PRN
  Administered 2017-05-27: 4 mL via INTRA_ARTICULAR

## 2017-05-28 MED ORDER — METHYLPREDNISOLONE ACETATE 40 MG/ML IJ SUSP
40.0000 mg | INTRAMUSCULAR | Status: AC | PRN
  Administered 2017-05-27: 40 mg via INTRA_ARTICULAR

## 2017-05-31 ENCOUNTER — Encounter: Payer: Self-pay | Admitting: Diagnostic Neuroimaging

## 2017-05-31 ENCOUNTER — Ambulatory Visit (INDEPENDENT_AMBULATORY_CARE_PROVIDER_SITE_OTHER): Payer: Managed Care, Other (non HMO) | Admitting: Diagnostic Neuroimaging

## 2017-05-31 VITALS — BP 123/76 | HR 81 | Ht 72.0 in | Wt 292.0 lb

## 2017-05-31 DIAGNOSIS — M170 Bilateral primary osteoarthritis of knee: Secondary | ICD-10-CM | POA: Diagnosis not present

## 2017-05-31 DIAGNOSIS — M961 Postlaminectomy syndrome, not elsewhere classified: Secondary | ICD-10-CM | POA: Diagnosis not present

## 2017-05-31 DIAGNOSIS — E1142 Type 2 diabetes mellitus with diabetic polyneuropathy: Secondary | ICD-10-CM | POA: Diagnosis not present

## 2017-05-31 DIAGNOSIS — M5416 Radiculopathy, lumbar region: Secondary | ICD-10-CM | POA: Diagnosis not present

## 2017-05-31 DIAGNOSIS — J449 Chronic obstructive pulmonary disease, unspecified: Secondary | ICD-10-CM

## 2017-05-31 DIAGNOSIS — Z6839 Body mass index (BMI) 39.0-39.9, adult: Secondary | ICD-10-CM

## 2017-05-31 NOTE — Progress Notes (Signed)
[facility]  PATIENT: Eddie Carter DOB: 01-Aug-1941  REFERRING CLINICIAN: Precious Reel HISTORY FROM: patient  REASON FOR VISIT: follow up     HISTORICAL  CHIEF COMPLAINT:  Chief Complaint  Patient presents with  . NP Ricard Dillon    Need consult prior to [place]/EMG  . Weakness Bilateral LE    HISTORY OF PRESENT ILLNESS:   UPDATE 05/31/17: Since last visit, continues with low back pain, knee pain, freq falls, including right shoulder injury. Also reports 30 years ago, had an "attack" on his arms and legs with numbness and tingling, had EMG/NCS, lab testing, and within 3 months, after starting some vitamins, symptoms improved.   UPDATE 08/22/15: Since last visit, symptoms continue. Still with back pain, lower ext pain, numbness, memory loss, decreased family support, arthritis pain.   PRIOR HPI (05/21/15): 76 year old male with migraine, skin cancer, diabetes, hypertension, stroke, COPD, here for evaluation of balance and gait difficulty. September 2015 patient was injured when he was working in his office, book case fell over and he caught it to prevent it from falling down. He felt a pop sensation in his left lower back radiating down his left leg. He was evaluated and diagnosed with disc herniation and treated with decompression in December 2015 with improvement in symptoms. January 2016 patient slipped on ice and had recurrent pain, and bilateral legs, found to have recurrent disc herniation. He is treated with surgery and fiber 2016 with some improvement symptoms. Since that time he has not been able to return to his prior level of functioning. He has generalized deconditioning, weakness, pain, as well as fear of falling and balance difficulties. Patient referred to me for consideration of alternate explanation of balance and gait difficulties besides his lumbar spine disease and lumbar radiculopathy.   REVIEW OF SYSTEMS: Full 14 system review of systems performed and  negative except: Itching eyes wheezing shortness of breath leg swelling rectal pain rectal bleeding constipation frequent waking joint pain joint swelling walking difficulty frequent urination incontinent bladder memory loss dizziness headache weakness.   ALLERGIES: Allergies  Allergen Reactions  . Asa [Aspirin] Shortness Of Breath  . Nsaids Other (See Comments)    Makes him bleed.    HOME MEDICATIONS: Outpatient Medications Prior to Visit  Medication Sig Dispense Refill  . acetaminophen (TYLENOL) 500 MG tablet Take 1,000 mg by mouth 2 (two) times daily.    Marland Kitchen albuterol (PROVENTIL HFA;VENTOLIN HFA) 108 (90 Base) MCG/ACT inhaler Inhale into the lungs.    . budesonide-formoterol (SYMBICORT) 160-4.5 MCG/ACT inhaler Inhale 2 puffs into the lungs 2 (two) times daily. 3 Inhaler 1  . Coenzyme Q10 (CO Q-10) 100 MG CAPS Take 1 tablet by mouth at bedtime.    . fluticasone (FLONASE) 50 MCG/ACT nasal spray Place 2 sprays into both nostrils daily. 16 g 5  . hydrOXYzine (ATARAX/VISTARIL) 10 MG tablet Take 2 tablets by mouth at bedtime.    Jerrye Beavers Polysacch (ALCORTIN A) 1-2-1 % GEL Apply topically.    Marland Kitchen levothyroxine (SYNTHROID, LEVOTHROID) 50 MCG tablet Take 50 mcg by mouth daily. Take on an empty stomach with a glass of water at least 30-60 minutes before breakfast.    . Multiple Vitamin (MULTIVITAMIN) capsule Take 1 capsule by mouth daily.    . polyethylene glycol (MIRALAX / GLYCOLAX) packet Take 17 g by mouth daily as needed. Reported on 01/21/2016    . RA KRILL OIL 500 MG CAPS Take by mouth.    . tiotropium (SPIRIVA) 18 MCG  inhalation capsule Place 1 capsule (18 mcg total) into inhaler and inhale daily. 90 capsule 1  . traMADol (ULTRAM) 50 MG tablet Take 1 tablet (50 mg total) by mouth every 6 (six) hours as needed. 40 tablet 0  . Cholecalciferol (VITAMIN D3) 2000 units TABS Take by mouth 2 (two) times daily.    Jerrye Beavers Polysacch 1-2-1 % GEL Apply topically.    .  Cholecalciferol (VITAMIN D) 1000 UNITS capsule Take 3,000 Units by mouth daily.    Marland Kitchen CINNAMON PO Take 4,000 mg by mouth daily at 2 PM.    . clopidogrel (PLAVIX) 75 MG tablet Take 75 mg by mouth daily.    . cefdinir (OMNICEF) 300 MG capsule Take 1 capsule (300 mg total) by mouth 2 (two) times daily. (Patient not taking: Reported on 05/31/2017) 20 capsule 0  . dutasteride (AVODART) 0.5 MG capsule Take 1 capsule (0.5 mg total) by mouth daily. (Patient not taking: Reported on 05/31/2017) 90 capsule 12  . furosemide (LASIX) 40 MG tablet Take 40 mg by mouth daily.    Marland Kitchen HYDROcodone-acetaminophen (NORCO/VICODIN) 5-325 MG per tablet Take 1 tablet by mouth every 6 (six) hours as needed for moderate pain.    . magnesium hydroxide (MILK OF MAGNESIA) 400 MG/5ML suspension Take 30 mLs by mouth daily.    . methylPREDNISolone (MEDROL) 4 MG tablet Take one tablet po daily for 2 weeks then take 1/2 tablet daily till out. (Patient not taking: Reported on 05/31/2017) 21 tablet 0  . omega-3 acid ethyl esters (LOVAZA) 1 g capsule Take 1 g by mouth.    . Omega-3 Fatty Acids (FISH OIL) 1000 MG CAPS Take by mouth.    . pregabalin (LYRICA) 50 MG capsule Take 50 mg by mouth 2 (two) times daily.    . solifenacin (VESICARE) 10 MG tablet Take 1 tablet (10 mg total) by mouth daily as needed. (Patient not taking: Reported on 05/31/2017) 90 tablet 12  . topiramate (TOPAMAX) 200 MG tablet Take 200 mg by mouth daily. Nightly     No facility-administered medications prior to visit.     PAST MEDICAL HISTORY: Past Medical History:  Diagnosis Date  . Arthritis   . Asthma   . BPH (benign prostatic hyperplasia)   . 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 Complication of anesthesia    after septoplasty and uvulectomy-was in icu- [facility] - 10 yrs. ago  . COPD (chronic obstructive pulmonary disease) ([place])   . Diabetes mellitus without complication ([place])   . ED (erectile dysfunction)   . Full dentures   . GERD (gastroesophageal  reflux disease)    no meds  . Headache    difficulty with headaches- 1950's , again in 1967, 2008- again problems with migrraines   . History of GI bleed   . History of urethral stricture   . HOH (hard of hearing)   . Hypogonadism in male   . Joint pain   . Medial meniscus, posterior horn derangement    left knee  . Obesity   . Pneumonia 2014   [facility]  . Shortness of breath   . Skin cancer   . Sleep apnea    uses a cpap, most nights   . Stroke [facility]) 1995   some speech and thought processes slow  . Traumatic tear of lateral meniscus of right knee    right knee  . Urinary frequency   . Urinary incontinence     PAST SURGICAL HISTORY: Past Surgical  History:  Procedure Laterality Date  . APPENDECTOMY  1963  . CARPAL TUNNEL RELEASE     left  . COLONOSCOPY    . COLONOSCOPY WITH PROPOFOL N/A 12/07/2016   Procedure: COLONOSCOPY WITH PROPOFOL;  Surgeon: Lollie Sails, MD;  Location: [facility] ENDOSCOPY;  Service: Endoscopy;  Laterality: N/A;  . EYE SURGERY     foreign body removed fr. eye, ? side   . FOOT FOREIGN BODY REMOVAL  1976   left foot -multiple pieces glass  . HEMORROIDECTOMY  1995  . KNEE ARTHROSCOPY Bilateral 01/17/2013   Procedure: ARTHROSCOPY KNEE BILATERAL WITH MEDIAL AND LATERAL MENISECTOMIES;  Surgeon: Lorn Junes, MD;  Location: [place];  Service: Orthopedics;  Laterality: Bilateral;  . LUMBAR LAMINECTOMY N/A 11/05/2014   Procedure: LEFT L3-4 MICRODISCECTOMY;  Surgeon: Jessy Oto, MD;  Location: [place];  Service: Orthopedics;  Laterality: N/A;  . LUMBAR LAMINECTOMY/DECOMPRESSION MICRODISCECTOMY N/A 02/12/2015   Procedure: RE-DO LEFT L3-4 MICRODISCECTOMY;  Surgeon: Jessy Oto, MD;  Location: [place];  Service: Orthopedics;  Laterality: N/A;  . NASAL SEPTUM SURGERY  1995  . TONSILLECTOMY    . UPPER GI ENDOSCOPY    . UVULOPALATOPHARYNGOPLASTY (UPPP)/TONSILLECTOMY/SEPTOPLASTY  1995   same time with septoplasty-ended up ICU almost trached     FAMILY HISTORY: Family History  Problem Relation Age of Onset  . Cancer Mother   . Heart attack Father   . Heart disease Unknown   . Arthritis Unknown   . Prostate cancer Neg Hx   . Kidney disease Neg Hx     SOCIAL HISTORY:  Social History   Social History  . Marital status: Married    Spouse name: Mardene Celeste  . Number of children: 2  . Years of education: 55   Occupational History  .      register of deeds   Social History Main Topics  . Smoking status: Former Smoker    Packs/day: 1.50    Years: 40.00    Types: Cigarettes    Quit date: 01/14/1996  . Smokeless tobacco: Never Used  . Alcohol use Yes     Comment: wine in a month  . Drug use: No  . Sexual activity: Not on file   Other Topics Concern  . Not on file   Social History Narrative   Lives at home with wife, son   caffeine use - 2 cups coffee daily, sodas 1 a day, occas tea     PHYSICAL EXAM  GENERAL EXAM/CONSTITUTIONAL: Vitals:  Vitals:   05/31/17 1502  BP: 123/76  Pulse: 81  Weight: 292 lb (132.5 kg)  Height: 6' (1.829 m)   Body mass index is 39.6 kg/m. No exam data present  Patient is in no distress; well developed, nourished and groomed; neck is supple  CARDIOVASCULAR:  Examination of carotid arteries is normal; no carotid bruits  Regular rate and rhythm, no murmurs  Examination of peripheral vascular system by observation and palpation is normal  EYES:  Ophthalmoscopic exam of optic discs and posterior segments is normal; no papilledema or hemorrhages  MUSCULOSKELETAL:  Gait, strength, tone, movements noted in Neurologic exam below  NEUROLOGIC: MENTAL STATUS:  No flowsheet data found.  awake, alert, oriented to person, place and time  recent and remote memory intact  normal attention and concentration  language fluent, comprehension intact, naming intact,   fund of knowledge appropriate  CRANIAL NERVE:   2nd - no papilledema on fundoscopic exam  2nd, 3rd,  4th, 6th -  pupils equal and reactive to light, visual fields full to confrontation, extraocular muscles intact, no nystagmus  5th - facial sensation symmetric  7th - facial strength symmetric  8th - hearing intact  9th - palate elevates symmetrically, uvula midline  11th - shoulder shrug symmetric  12th - tongue protrusion midline  MOTOR:   normal bulk and tone, full strength in the BUE (EXCEPT RIGHT UPPER EXT 4 DUE TO SHOULDER PAIN); BLE LIMITED BY PAIN (HF 4; KE/KF 5, DF 5)  SENSORY:   normal and symmetric to light touch; DECR PP, TEMP AND VIB IN TOES/FEET IN GRADIENT UP TO KNEES  COORDINATION:   finger-nose-finger, fine finger movements --> MILD POSTURAL TREMOR  REFLEXES:   deep tendon reflexes TRACE IN BUE; ABSENT IN BLE  GAIT/STATION:   IN WHEELCHAIR; ANTALGIC GAIT; USES ROLLING WALKER     DIAGNOSTIC DATA (LABS, IMAGING, TESTING) - I reviewed patient records, labs, notes, testing and imaging myself where available.  Lab Results  Component Value Date   WBC 9.0 02/07/2015   HGB 14.4 02/07/2015   HCT 44.1 01/16/2016   MCV 91.7 02/07/2015   PLT 180 02/07/2015      Component Value Date/Time   NA 137 02/07/2015 1414   NA 138 10/28/2014 2234   K 4.1 02/07/2015 1414   K 3.9 10/28/2014 2234   CL 104 02/07/2015 1414   CL 100 10/28/2014 2234   CO2 21 02/07/2015 1414   CO2 28 10/28/2014 2234   GLUCOSE 127 (H) 02/07/2015 1414   GLUCOSE 126 (H) 10/28/2014 2234   BUN 9 02/07/2015 1414   BUN 22 (H) 10/28/2014 2234   CREATININE 1.06 02/07/2015 1414   CREATININE 1.14 10/28/2014 2234   CALCIUM 9.2 02/07/2015 1414   CALCIUM 8.9 10/28/2014 2234   PROT 6.4 01/16/2016 0843   PROT 7.2 10/28/2014 2234   ALBUMIN 3.9 01/16/2016 0843   ALBUMIN 3.8 10/28/2014 2234   AST 30 01/16/2016 0843   AST 37 10/28/2014 2234   ALT 34 01/16/2016 0843   ALT 69 (H) 10/28/2014 2234   ALKPHOS 57 01/16/2016 0843   ALKPHOS 69 10/28/2014 2234   BILITOT 0.3 01/16/2016 0843   BILITOT  0.7 10/28/2014 2234   GFRNONAA 68 (L) 02/07/2015 1414   GFRNONAA >60 10/28/2014 2234   GFRNONAA >60 07/28/2013 0643   GFRAA 79 (L) 02/07/2015 1414   GFRAA >60 10/28/2014 2234   GFRAA >60 07/28/2013 0643   Lab Results  Component Value Date   CHOL 187 01/16/2016   HDL 26 (L) 01/16/2016   LDLCALC 110 (H) 01/16/2016   TRIG 253 (H) 01/16/2016   CHOLHDL 7.2 (H) 01/16/2016   Lab Results  Component Value Date   HGBA1C 6.2 (H) 05/21/2015   Lab Results  Component Value Date   VITAMINB12 624 05/21/2015   Lab Results  Component Value Date   TSH 6.430 (H) 01/16/2016    01/14/15 MRI lumbar spine [I reviewed images myself and agree with interpretation. -VRP]  1. L3-L4 rightward and cephalad disc extrusion with suspected sequestered fragment 10 x 17 x 18 mm. 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 2. Postoperative changes on the left at L3-L4 since 10/29/2014, and otherwise stable lumbar spine.  05/01/16 MRI lumbar spine - Interval repeat left hemilaminectomy and discectomy at L3-4 there is a small residual/ recurrent left posterior lateral disc herniation at this level of indeterminate significance. There would be some potential this could affect the nerves in the left lateral recess and intervertebral  foramen on the left.  - Disc bulge at L4-5 without stenosis or neural compression. - Chronic disc degeneration at L5-S1. Right foraminal narrowing that could possibly affect the right L5 nerve root.     ASSESSMENT AND PLAN  76 y.o. 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    Dx: lumbar radiculopathy + neuropathy + knee pain + deconditioning / obesity  Lumbar  radiculopathy  Diabetic polyneuropathy associated with type 2 diabetes mellitus (HCC)  Chronic obstructive pulmonary disease, unspecified COPD type ([place])  Failed back surgical syndrome  BMI 39.0-39.9,adult  Osteoarthritis of both knees, unspecified osteoarthritis type     PLAN:  I spent 25 minutes of face to face time with patient. Greater than 50% of time was spent in counseling and coordination of care with patient. In summary we discussed:   GAIT DIFFICULTY (established problem, worsening) - continue pain mgmt - continue PT and using walker  LUMBAR RADICULOPATHY - per orthopedic clinic and pain mgmt  NEUROPATHY (likely diabetic) - EMG/NCS not likely to be helpful in this situation - check B12, A1c labs - follow up with PCP re: diabetes and hypothyroidism  Orders Placed This Encounter  Procedures  . Vitamin B12  . Hemoglobin A1c   Return if symptoms worsen or fail to improve, for return to PCP and orthopedic clinic.    Penni Bombard, MD 02/23/3952, 2:02 PM Certified in Neurology, Neurophysiology and Neuroimaging  [facility] [address], [place], [place] [address] [phone]

## 2017-06-01 ENCOUNTER — Telehealth (INDEPENDENT_AMBULATORY_CARE_PROVIDER_SITE_OTHER): Payer: Self-pay | Admitting: Specialist

## 2017-06-01 NOTE — Telephone Encounter (Signed)
Cindy (PT) with [facility] called needing verbal orders for 3 wk 2 and 2 wk 4. The number to contact cindy is [phone]

## 2017-06-01 NOTE — Telephone Encounter (Signed)
I called and gave Jenny Reichmann verbal ok for the orders requested

## 2017-06-02 LAB — HEMOGLOBIN A1C
ESTIMATED AVERAGE GLUCOSE: 117 mg/dL
HEMOGLOBIN A1C: 5.7 % — AB (ref 4.8–5.6)

## 2017-06-02 LAB — VITAMIN B12: VITAMIN B 12: 564 pg/mL (ref 232–1245)

## 2017-06-04 ENCOUNTER — Telehealth: Payer: Self-pay | Admitting: *Deleted

## 2017-06-04 NOTE — Telephone Encounter (Signed)
Spoke to pt and relayed that lab results were unremarkable, continue current plan of care.  He verbalized understanding.

## 2017-06-04 NOTE — Telephone Encounter (Signed)
-----   Message from Penni Bombard, MD sent at 06/03/2017  8:11 PM EDT ----- Unremarkable labs. Continue current plan. Please call patient. -VRP

## 2017-06-17 ENCOUNTER — Encounter (INDEPENDENT_AMBULATORY_CARE_PROVIDER_SITE_OTHER): Payer: Self-pay | Admitting: Specialist

## 2017-06-17 ENCOUNTER — Ambulatory Visit (INDEPENDENT_AMBULATORY_CARE_PROVIDER_SITE_OTHER): Payer: Managed Care, Other (non HMO) | Admitting: Specialist

## 2017-06-17 VITALS — BP 117/65

## 2017-06-17 DIAGNOSIS — M5416 Radiculopathy, lumbar region: Secondary | ICD-10-CM | POA: Diagnosis not present

## 2017-06-17 DIAGNOSIS — M75121 Complete rotator cuff tear or rupture of right shoulder, not specified as traumatic: Secondary | ICD-10-CM | POA: Diagnosis not present

## 2017-06-17 NOTE — Progress Notes (Signed)
Office Visit Note   Patient: Eddie Carter           Date of Birth: 15-Aug-1941           MRN: [id number] Visit Date: 06/17/2017              Requested by: [name], MD [address] [place], [place] [address] PCP: [name], MD   Assessment & Plan: Visit Diagnoses:  1. Complete tear of right rotator cuff   2. Chronic left lumbar radiculopathy     Plan: Avoid overhead lifting and overhead use of the arms. Do not lift greater than 5 lbs. Home health nursing for PT to continue at home for another one month.  Ice after PT and heat before therapy  Follow-Up Instructions: No Follow-up on file.   Orders:  No orders of the defined types were placed in this encounter.  No orders of the defined types were placed in this encounter.     Procedures: No procedures performed   Clinical Data: No additional findings.   Subjective: Chief Complaint  Patient presents with  . Right Shoulder - Follow-up    Mri review    76 year old right handed male with right complete rotator cuff tear. MRI shows retraction of the muscles of the right shoulder muscles and is concerning for chronic cuff tear. He has therapy that is seeing him for treatment of the right rotator cuff and working on strengthening and ROM. This is better by about 25% since starting therapy. Has been seen in PT at home about 5 times.     Review of Systems  Constitutional: Positive for activity change. Negative for appetite change, fatigue and unexpected weight change.  HENT: Negative for ear pain, facial swelling, hearing loss, sinus pain, sinus pressure, sneezing and sore throat.   Eyes: Negative.  Negative for pain and itching.  Respiratory: Negative for chest tightness, shortness of breath and wheezing.   Cardiovascular: Positive for leg swelling. Negative for chest pain.  Gastrointestinal: Positive for constipation and diarrhea. Negative for abdominal pain, blood in stool, nausea and vomiting.    Endocrine: Negative.   Genitourinary: Negative.  Negative for difficulty urinating, dysuria, enuresis, flank pain, frequency, genital sores and hematuria.  Musculoskeletal: Positive for back pain, gait problem, joint swelling and neck pain.  Skin: Negative.  Negative for color change, pallor, rash and wound.  Allergic/Immunologic: Negative.   Neurological: Negative for dizziness, weakness and light-headedness.  Hematological: Negative.   Psychiatric/Behavioral: Positive for confusion and dysphoric mood. Negative for agitation, behavioral problems, decreased concentration, hallucinations, self-injury, sleep disturbance and suicidal ideas. The patient is not nervous/anxious and is not hyperactive.      Objective: Vital Signs: BP 117/65 (BP Location: Left Arm, Patient Position: Sitting)   Physical Exam  Constitutional: He is oriented to person, place, and time. He appears well-developed and well-nourished.  HENT:  Head: Normocephalic and atraumatic.  Eyes: Pupils are equal, round, and reactive to light. EOM are normal.  Neck: Normal range of motion. Neck supple.  Pulmonary/Chest: Effort normal and breath sounds normal.  Abdominal: Soft. Bowel sounds are normal.  Neurological: He is alert and oriented to person, place, and time.  Skin: Skin is warm and dry.  Psychiatric: He has a normal mood and affect. His behavior is normal. Judgment and thought content normal.    Right Shoulder Exam   Tenderness  The patient is experiencing tenderness in the biceps tendon and acromion.  Range of Motion  Active  Abduction: abnormal  Passive Abduction: abnormal  Extension: abnormal  Forward Flexion: abnormal  External Rotation: abnormal  Internal Rotation 0 degrees: T9  Internal Rotation 90 degrees: 60   Muscle Strength  Abduction: 4/5  Internal Rotation: 5/5  External Rotation: 4/5  Supraspinatus: 4/5  Subscapularis: 5/5  Biceps: 4/5       Specialty Comments:  No specialty  comments available.  Imaging: No results found.   PMFS History: Patient Active Problem List   Diagnosis Date Noted  . Cephalalgia 01/29/2016  . Adiposity 01/29/2016  . Hypertriglyceridemia 01/29/2016  . H/O respiratory system disease 01/29/2016  . H/O erectile dysfunction 01/29/2016  . Cerebrovascular accident, old 01/29/2016  . Diabetes mellitus ([place]) 01/29/2016  . Chronic obstructive pulmonary disease ([place]) 01/29/2016  . Benign fibroma of prostate 01/29/2016  . Chronic pain 01/29/2016  . 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 Abnormal MRI, lumbar spine 01/29/2016  . Chronic low back pain (Location of Primary Source of Pain) (Bilateral) (L>R) 01/29/2016  . Chronic lower extremity pain (Location of Secondary source of pain) (Bilateral) (L>R) 01/29/2016  . Long term current use of opiate analgesic 01/29/2016  . Long term prescription opiate use 01/29/2016  . Opiate use 01/29/2016  . Encounter for therapeutic drug level monitoring 01/29/2016  . Encounter for pain management planning 01/29/2016  . Chronic lumbar radicular pain (Location of Secondary source of pain) (Bilateral) (L4 Dermatome) (L>R) 01/29/2016  . Chronic hip pain (Location of Tertiary source of pain) (Bilateral) (L>R) 01/29/2016  . Chronic knee pain (Bilateral) (L>R) 01/29/2016  . Chronic anticoagulation (Plavix) 01/29/2016  . Erectile dysfunction of organic origin 01/22/2016  . BPH with obstruction/lower urinary tract symptoms 07/24/2015  . Hypogonadism in male 07/24/2015  . HNP (herniated nucleus pulposus), lumbar 11/05/2014    Class: Acute  . Herniated nucleus pulposus, lumbar 11/05/2014  . Pulmonary nodules 10/25/2013  . ILD (interstitial lung disease) ([place]) 08/22/2013  . Traumatic tear of lateral meniscus of right knee   . Medial meniscus, posterior horn derangement   . COPD (chronic obstructive pulmonary disease) ([place])   . Shortness of breath   . Sleep apnea   . Stroke ([place])   . Arthritis   . GERD  (gastroesophageal reflux disease)   . History of GI bleed   . HOH (hard of hearing)   . Full dentures   . Complication of anesthesia    Past Medical History:  Diagnosis Date  . Arthritis   . Asthma   . BPH (benign prostatic hyperplasia)   . 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 Complication of anesthesia    after septoplasty and uvulectomy-was in icu- [facility] - 10 yrs. ago  . COPD (chronic obstructive pulmonary disease) ([place])   . Diabetes mellitus without complication ([place])   . ED (erectile dysfunction)   . Full dentures   . GERD (gastroesophageal reflux disease)    no meds  . Headache    difficulty with headaches- 1950's , again in 1967, 2008- again problems with migrraines   . History of GI bleed   . History of urethral stricture   . HOH (hard of hearing)   . Hypogonadism in male   . Joint pain   . Medial meniscus, posterior horn derangement    left knee  . Obesity   . Pneumonia 2014   [facility]  . Shortness of breath   . Skin cancer   . Sleep apnea    uses a cpap, most nights   .  Stroke [facility]) 1995   some speech and thought processes slow  . Traumatic tear of lateral meniscus of right knee    right knee  . Urinary frequency   . Urinary incontinence     Family History  Problem Relation Age of Onset  . Cancer Mother   . Heart attack Father   . Heart disease Unknown   . Arthritis Unknown   . Prostate cancer Neg Hx   . Kidney disease Neg Hx     Past Surgical History:  Procedure Laterality Date  . APPENDECTOMY  1963  . CARPAL TUNNEL RELEASE     left  . COLONOSCOPY    . COLONOSCOPY WITH PROPOFOL N/A 12/07/2016   Procedure: COLONOSCOPY WITH PROPOFOL;  Surgeon: Lollie Sails, MD;  Location: [facility] ENDOSCOPY;  Service: Endoscopy;  Laterality: N/A;  . EYE SURGERY     foreign body removed fr. eye, ? side   . FOOT FOREIGN BODY REMOVAL  1976   left foot -multiple pieces glass  . HEMORROIDECTOMY  1995  . KNEE ARTHROSCOPY Bilateral 01/17/2013   Procedure:  ARTHROSCOPY KNEE BILATERAL WITH MEDIAL AND LATERAL MENISECTOMIES;  Surgeon: Lorn Junes, MD;  Location: [place] [facility];  Service: Orthopedics;  Laterality: Bilateral;  . LUMBAR LAMINECTOMY N/A 11/05/2014   Procedure: LEFT L3-4 MICRODISCECTOMY;  Surgeon: Jessy Oto, MD;  Location: [facility];  Service: Orthopedics;  Laterality: N/A;  . LUMBAR LAMINECTOMY/DECOMPRESSION MICRODISCECTOMY N/A 02/12/2015   Procedure: RE-DO LEFT L3-4 MICRODISCECTOMY;  Surgeon: Jessy Oto, MD;  Location: [place];  Service: Orthopedics;  Laterality: N/A;  . NASAL SEPTUM SURGERY  1995  . TONSILLECTOMY    . UPPER GI ENDOSCOPY    . UVULOPALATOPHARYNGOPLASTY (UPPP)/TONSILLECTOMY/SEPTOPLASTY  1995   same time with septoplasty-ended up ICU almost trached   Social History   Occupational History  .      register of deeds   Social History Main Topics  . Smoking status: Former Smoker    Packs/day: 1.50    Years: 40.00    Types: Cigarettes    Quit date: 01/14/1996  . Smokeless tobacco: Never Used  . Alcohol use Yes     Comment: wine in a month  . Drug use: No  . Sexual activity: Not on file

## 2017-06-17 NOTE — Patient Instructions (Addendum)
Avoid overhead lifting and overhead use of the arms. Do not lift greater than 5 lbs. Home health nursing for PT to continue at home for another one month.  Ice after PT and heat before therapy.

## 2017-07-13 ENCOUNTER — Emergency Department: Payer: Managed Care, Other (non HMO)

## 2017-07-13 ENCOUNTER — Observation Stay
Admission: EM | Admit: 2017-07-13 | Discharge: 2017-07-15 | Disposition: A | Discharge status: Skilled Nursing Facility | Payer: Managed Care, Other (non HMO) | Attending: Internal Medicine | Admitting: Internal Medicine

## 2017-07-13 DIAGNOSIS — R531 Weakness: Secondary | ICD-10-CM

## 2017-07-13 DIAGNOSIS — H9190 Unspecified hearing loss, unspecified ear: Secondary | ICD-10-CM | POA: Diagnosis not present

## 2017-07-13 DIAGNOSIS — G4730 Sleep apnea, unspecified: Secondary | ICD-10-CM | POA: Insufficient documentation

## 2017-07-13 DIAGNOSIS — Z888 Allergy status to other drugs, medicaments and biological substances status: Secondary | ICD-10-CM | POA: Insufficient documentation

## 2017-07-13 DIAGNOSIS — K219 Gastro-esophageal reflux disease without esophagitis: Secondary | ICD-10-CM | POA: Diagnosis not present

## 2017-07-13 DIAGNOSIS — J90 Pleural effusion, not elsewhere classified: Secondary | ICD-10-CM | POA: Insufficient documentation

## 2017-07-13 DIAGNOSIS — M25512 Pain in left shoulder: Secondary | ICD-10-CM

## 2017-07-13 DIAGNOSIS — I69328 Other speech and language deficits following cerebral infarction: Secondary | ICD-10-CM | POA: Insufficient documentation

## 2017-07-13 DIAGNOSIS — N529 Male erectile dysfunction, unspecified: Secondary | ICD-10-CM | POA: Diagnosis not present

## 2017-07-13 DIAGNOSIS — W19XXXA Unspecified fall, initial encounter: Secondary | ICD-10-CM

## 2017-07-13 DIAGNOSIS — W1830XA Fall on same level, unspecified, initial encounter: Secondary | ICD-10-CM | POA: Diagnosis not present

## 2017-07-13 DIAGNOSIS — K5909 Other constipation: Secondary | ICD-10-CM | POA: Insufficient documentation

## 2017-07-13 DIAGNOSIS — N400 Enlarged prostate without lower urinary tract symptoms: Secondary | ICD-10-CM | POA: Diagnosis not present

## 2017-07-13 DIAGNOSIS — R32 Unspecified urinary incontinence: Secondary | ICD-10-CM | POA: Diagnosis not present

## 2017-07-13 DIAGNOSIS — T17908A Unspecified foreign body in respiratory tract, part unspecified causing other injury, initial encounter: Secondary | ICD-10-CM

## 2017-07-13 DIAGNOSIS — E119 Type 2 diabetes mellitus without complications: Secondary | ICD-10-CM | POA: Diagnosis not present

## 2017-07-13 DIAGNOSIS — I69319 Unspecified symptoms and signs involving cognitive functions following cerebral infarction: Secondary | ICD-10-CM | POA: Insufficient documentation

## 2017-07-13 DIAGNOSIS — G8929 Other chronic pain: Secondary | ICD-10-CM | POA: Insufficient documentation

## 2017-07-13 DIAGNOSIS — Z79899 Other long term (current) drug therapy: Secondary | ICD-10-CM | POA: Insufficient documentation

## 2017-07-13 DIAGNOSIS — Z886 Allergy status to analgesic agent status: Secondary | ICD-10-CM | POA: Insufficient documentation

## 2017-07-13 DIAGNOSIS — Z6839 Body mass index (BMI) 39.0-39.9, adult: Secondary | ICD-10-CM | POA: Insufficient documentation

## 2017-07-13 DIAGNOSIS — M79661 Pain in right lower leg: Secondary | ICD-10-CM | POA: Diagnosis not present

## 2017-07-13 DIAGNOSIS — M545 Low back pain, unspecified: Secondary | ICD-10-CM | POA: Diagnosis present

## 2017-07-13 DIAGNOSIS — E669 Obesity, unspecified: Secondary | ICD-10-CM | POA: Insufficient documentation

## 2017-07-13 DIAGNOSIS — M549 Dorsalgia, unspecified: Secondary | ICD-10-CM

## 2017-07-13 DIAGNOSIS — G43909 Migraine, unspecified, not intractable, without status migrainosus: Secondary | ICD-10-CM | POA: Insufficient documentation

## 2017-07-13 DIAGNOSIS — Z9989 Dependence on other enabling machines and devices: Secondary | ICD-10-CM | POA: Insufficient documentation

## 2017-07-13 DIAGNOSIS — D72829 Elevated white blood cell count, unspecified: Secondary | ICD-10-CM | POA: Diagnosis not present

## 2017-07-13 DIAGNOSIS — Z85828 Personal history of other malignant neoplasm of skin: Secondary | ICD-10-CM | POA: Diagnosis not present

## 2017-07-13 DIAGNOSIS — J449 Chronic obstructive pulmonary disease, unspecified: Secondary | ICD-10-CM | POA: Insufficient documentation

## 2017-07-13 DIAGNOSIS — M25511 Pain in right shoulder: Secondary | ICD-10-CM

## 2017-07-13 DIAGNOSIS — M1990 Unspecified osteoarthritis, unspecified site: Secondary | ICD-10-CM | POA: Diagnosis not present

## 2017-07-13 DIAGNOSIS — M79662 Pain in left lower leg: Secondary | ICD-10-CM | POA: Insufficient documentation

## 2017-07-13 DIAGNOSIS — Z87891 Personal history of nicotine dependence: Secondary | ICD-10-CM | POA: Insufficient documentation

## 2017-07-13 DIAGNOSIS — S32010A Wedge compression fracture of first lumbar vertebra, initial encounter for closed fracture: Secondary | ICD-10-CM

## 2017-07-13 DIAGNOSIS — S32019A Unspecified fracture of first lumbar vertebra, initial encounter for closed fracture: Principal | ICD-10-CM | POA: Insufficient documentation

## 2017-07-13 DIAGNOSIS — Y92832 Beach as the place of occurrence of the external cause: Secondary | ICD-10-CM | POA: Insufficient documentation

## 2017-07-13 DIAGNOSIS — R062 Wheezing: Secondary | ICD-10-CM

## 2017-07-13 LAB — GLUCOSE, CAPILLARY
Glucose-Capillary: 103 mg/dL — ABNORMAL HIGH (ref 65–99)
Glucose-Capillary: 112 mg/dL — ABNORMAL HIGH (ref 65–99)

## 2017-07-13 LAB — CBC WITH DIFFERENTIAL/PLATELET
BASOS PCT: 1 %
Basophils Absolute: 0.1 10*3/uL (ref 0–0.1)
EOS PCT: 3 %
Eosinophils Absolute: 0.4 10*3/uL (ref 0–0.7)
HEMATOCRIT: 39.8 % — AB (ref 40.0–52.0)
Hemoglobin: 13.6 g/dL (ref 13.0–18.0)
Lymphocytes Relative: 13 %
Lymphs Abs: 1.7 10*3/uL (ref 1.0–3.6)
MCH: 31 pg (ref 26.0–34.0)
MCHC: 34.3 g/dL (ref 32.0–36.0)
MCV: 90.6 fL (ref 80.0–100.0)
MONO ABS: 0.8 10*3/uL (ref 0.2–1.0)
MONOS PCT: 6 %
NEUTROS ABS: 10.8 10*3/uL — AB (ref 1.4–6.5)
Neutrophils Relative %: 77 %
Platelets: 181 10*3/uL (ref 150–440)
RBC: 4.39 MIL/uL — ABNORMAL LOW (ref 4.40–5.90)
RDW: 14.7 % — ABNORMAL HIGH (ref 11.5–14.5)
WBC: 13.8 10*3/uL — ABNORMAL HIGH (ref 3.8–10.6)

## 2017-07-13 LAB — COMPREHENSIVE METABOLIC PANEL
ALBUMIN: 3.4 g/dL — AB (ref 3.5–5.0)
ALT: 13 U/L — ABNORMAL LOW (ref 17–63)
ANION GAP: 7 (ref 5–15)
AST: 20 U/L (ref 15–41)
Alkaline Phosphatase: 54 U/L (ref 38–126)
BILIRUBIN TOTAL: 1 mg/dL (ref 0.3–1.2)
BUN: 11 mg/dL (ref 6–20)
CO2: 26 mmol/L (ref 22–32)
Calcium: 8.9 mg/dL (ref 8.9–10.3)
Chloride: 107 mmol/L (ref 101–111)
Creatinine, Ser: 0.87 mg/dL (ref 0.61–1.24)
GFR calc Af Amer: >60 mL/min (ref >60)
Glucose, Bld: 124 mg/dL — ABNORMAL HIGH (ref 65–99)
POTASSIUM: 4 mmol/L (ref 3.5–5.1)
Sodium: 140 mmol/L (ref 135–145)
TOTAL PROTEIN: 6.6 g/dL (ref 6.5–8.1)

## 2017-07-13 LAB — TROPONIN I

## 2017-07-13 MED ORDER — MOMETASONE FURO-FORMOTEROL FUM 200-5 MCG/ACT IN AERO
2.0000 | INHALATION_SPRAY | Freq: Two times a day (BID) | RESPIRATORY_TRACT | Status: DC
  Administered 2017-07-13 – 2017-07-15 (×3): 2 via RESPIRATORY_TRACT
  Filled 2017-07-13: qty 8.8

## 2017-07-13 MED ORDER — MORPHINE SULFATE (PF) 2 MG/ML IV SOLN
2.0000 mg | INTRAVENOUS | Status: DC | PRN
  Administered 2017-07-15: 2 mg via INTRAVENOUS
  Filled 2017-07-13: qty 1

## 2017-07-13 MED ORDER — TOPIRAMATE 100 MG PO TABS
200.0000 mg | ORAL_TABLET | Freq: Every day | ORAL | Status: DC
  Administered 2017-07-13 – 2017-07-15 (×2): 200 mg via ORAL
  Filled 2017-07-13 (×3): qty 2

## 2017-07-13 MED ORDER — TIOTROPIUM BROMIDE MONOHYDRATE 18 MCG IN CAPS
18.0000 ug | ORAL_CAPSULE | Freq: Every day | RESPIRATORY_TRACT | Status: DC
  Administered 2017-07-13 – 2017-07-15 (×2): 18 ug via RESPIRATORY_TRACT
  Filled 2017-07-13: qty 5

## 2017-07-13 MED ORDER — DARIFENACIN HYDROBROMIDE ER 7.5 MG PO TB24
7.5000 mg | ORAL_TABLET | Freq: Every day | ORAL | Status: DC
  Administered 2017-07-13 – 2017-07-15 (×2): 7.5 mg via ORAL
  Filled 2017-07-13 (×3): qty 1

## 2017-07-13 MED ORDER — DUTASTERIDE 0.5 MG PO CAPS
0.5000 mg | ORAL_CAPSULE | Freq: Every day | ORAL | Status: DC
  Administered 2017-07-13 – 2017-07-15 (×2): 0.5 mg via ORAL
  Filled 2017-07-13 (×3): qty 1

## 2017-07-13 MED ORDER — CLOPIDOGREL BISULFATE 75 MG PO TABS
75.0000 mg | ORAL_TABLET | Freq: Every day | ORAL | Status: DC
  Filled 2017-07-13: qty 1

## 2017-07-13 MED ORDER — INSULIN ASPART 100 UNIT/ML ~~LOC~~ SOLN: 0.0000 [IU] | Freq: Every day | SUBCUTANEOUS | Status: DC

## 2017-07-13 MED ORDER — OXYCODONE-ACETAMINOPHEN 5-325 MG PO TABS
2.0000 | ORAL_TABLET | Freq: Once | ORAL | Status: AC
  Administered 2017-07-13: 2 via ORAL
  Filled 2017-07-13: qty 2

## 2017-07-13 MED ORDER — OXYCODONE HCL 5 MG PO TABS: 5.0000 mg | ORAL_TABLET | ORAL | Status: DC | PRN

## 2017-07-13 MED ORDER — ONDANSETRON HCL 4 MG PO TABS: 4.0000 mg | ORAL_TABLET | Freq: Four times a day (QID) | ORAL | Status: DC | PRN

## 2017-07-13 MED ORDER — TAMSULOSIN HCL 0.4 MG PO CAPS
0.4000 mg | ORAL_CAPSULE | Freq: Every day | ORAL | Status: DC
  Administered 2017-07-13 – 2017-07-15 (×3): 0.4 mg via ORAL
  Filled 2017-07-13 (×2): qty 1

## 2017-07-13 MED ORDER — POLYETHYLENE GLYCOL 3350 17 G PO PACK: 17.0000 g | PACK | Freq: Every day | ORAL | Status: DC | PRN

## 2017-07-13 MED ORDER — DOCUSATE SODIUM 100 MG PO CAPS
100.0000 mg | ORAL_CAPSULE | Freq: Two times a day (BID) | ORAL | Status: DC
  Administered 2017-07-13 – 2017-07-15 (×4): 100 mg via ORAL
  Filled 2017-07-13 (×4): qty 1

## 2017-07-13 MED ORDER — INSULIN ASPART 100 UNIT/ML ~~LOC~~ SOLN
0.0000 [IU] | Freq: Three times a day (TID) | SUBCUTANEOUS | Status: DC
  Filled 2017-07-13: qty 1

## 2017-07-13 MED ORDER — HYDROXYZINE HCL 10 MG PO TABS
20.0000 mg | ORAL_TABLET | Freq: Every day | ORAL | Status: DC
  Administered 2017-07-13 – 2017-07-14 (×2): 20 mg via ORAL
  Filled 2017-07-13 (×3): qty 2

## 2017-07-13 MED ORDER — CYCLOBENZAPRINE HCL 10 MG PO TABS
10.0000 mg | ORAL_TABLET | Freq: Three times a day (TID) | ORAL | Status: DC | PRN
  Administered 2017-07-13 – 2017-07-15 (×3): 10 mg via ORAL
  Filled 2017-07-13 (×3): qty 1

## 2017-07-13 MED ORDER — MORPHINE SULFATE (PF) 4 MG/ML IV SOLN
4.0000 mg | Freq: Once | INTRAVENOUS | Status: AC
  Administered 2017-07-13: 2 mg via INTRAVENOUS
  Filled 2017-07-13: qty 1

## 2017-07-13 MED ORDER — LEVOTHYROXINE SODIUM 50 MCG PO TABS
50.0000 ug | ORAL_TABLET | Freq: Every day | ORAL | Status: DC
  Administered 2017-07-15: 50 ug via ORAL
  Filled 2017-07-13: qty 1

## 2017-07-13 MED ORDER — SENNA 8.6 MG PO TABS
1.0000 | ORAL_TABLET | Freq: Two times a day (BID) | ORAL | Status: DC
  Administered 2017-07-13 – 2017-07-15 (×4): 8.6 mg via ORAL
  Filled 2017-07-13 (×4): qty 1

## 2017-07-13 MED ORDER — ALBUTEROL SULFATE (2.5 MG/3ML) 0.083% IN NEBU: 2.5000 mg | INHALATION_SOLUTION | RESPIRATORY_TRACT | Status: DC | PRN

## 2017-07-13 MED ORDER — ACETAMINOPHEN 325 MG PO TABS
650.0000 mg | ORAL_TABLET | Freq: Four times a day (QID) | ORAL | Status: DC | PRN
  Filled 2017-07-13: qty 2

## 2017-07-13 MED ORDER — CLOPIDOGREL BISULFATE 75 MG PO TABS
75.0000 mg | ORAL_TABLET | Freq: Every day | ORAL | Status: DC
  Administered 2017-07-13: 75 mg via ORAL

## 2017-07-13 MED ORDER — ONDANSETRON HCL 4 MG/2ML IJ SOLN: 4.0000 mg | Freq: Four times a day (QID) | INTRAMUSCULAR | Status: DC | PRN

## 2017-07-13 MED ORDER — ACETAMINOPHEN 650 MG RE SUPP
650.0000 mg | Freq: Four times a day (QID) | RECTAL | Status: DC | PRN
  Filled 2017-07-13: qty 1

## 2017-07-13 MED ORDER — ENOXAPARIN SODIUM 40 MG/0.4ML ~~LOC~~ SOLN
40.0000 mg | SUBCUTANEOUS | Status: DC
  Administered 2017-07-13 – 2017-07-14 (×2): 40 mg via SUBCUTANEOUS
  Filled 2017-07-13 (×2): qty 0.4

## 2017-07-13 MED ORDER — OXYCODONE HCL 5 MG PO TABS
10.0000 mg | ORAL_TABLET | ORAL | Status: DC | PRN
  Administered 2017-07-13 – 2017-07-15 (×3): 10 mg via ORAL
  Filled 2017-07-13 (×3): qty 2

## 2017-07-13 MED ORDER — OXYCODONE-ACETAMINOPHEN 7.5-325 MG PO TABS: 1.0000 | ORAL_TABLET | Freq: Three times a day (TID) | ORAL | 0 refills | Status: DC | PRN

## 2017-07-13 MED ORDER — FLUTICASONE PROPIONATE 50 MCG/ACT NA SUSP
2.0000 | Freq: Every day | NASAL | Status: DC
  Administered 2017-07-13 – 2017-07-15 (×3): 2 via NASAL
  Filled 2017-07-13: qty 16

## 2017-07-13 MED ORDER — TAMSULOSIN HCL 0.4 MG PO CAPS
0.4000 mg | ORAL_CAPSULE | Freq: Every day | ORAL | Status: DC
  Filled 2017-07-13: qty 1

## 2017-07-13 NOTE — Progress Notes (Signed)
Primary SNF Benefits:  Number called: [phone] Rep: Lanelle Bal Reference Number: [id number]  [employer] Open Access Plus Plan active since 11/24/15. Individual deductible of $1500 met for calendar year.  Individual out of pocket max is $4000, of which $2700.48 met so far.  Confirmed with rep that Eddie Carter is considered patient's primary coverage.    In-network SNF Benefit: After deductible met, responsible for a 20% co-insurance for SNF services until $4000 individual out of pocket max met.  Limited to 100 SNF days per benefit period.  Per rep, all days available at this time.  Auth required: [id number].    Secondary SNF Benefits:   Number called: [phone] Rep: [place] Reference Number: [id number]   Patient has a [employer] Medicare Advantage PPO policy as secondary coverage, which is active since 11/23/16.  No deductible on plan.  $4000 out of pocket max, $0 met so far.   In-network SNF Benefit: $0 copay for days 1-20 and a $50 copay per day for days 21 -100.  Limited to 100 days each benefit period, all available at time of call.  3 day hospital stay is not required.  Auth required: [id number].

## 2017-07-13 NOTE — ED Notes (Signed)
Patient is a previous smoker. Per Son O2 is usually around 92%-93%. Patient's O2 was around 88%. Patient was asleep. Woke patient up and O2 back up to 92%. Patient also wears a CPAP at night.

## 2017-07-13 NOTE — Evaluation (Signed)
Physical Therapy Evaluation Patient Details Name: Eddie Carter MRN: [id number] DOB: 1941/07/17 Today's Date: 07/13/2017   History of Present Illness  Pt admitted to ED on 8/21with severe low back pain following x2 falls over the weekend; found to have L1 compression fx. Prior to admission pt required assist with all ADL's and amb with a RW. PMH: arthritis, asthma, BPH, COPD, diabetes mellitus, GERD, GI bleed, HOH, hypogonadism, obesity, pna, SOB, sleep apnea, urinary frequency, and urinary incontinence.   Clinical Impression  Pt is pleasant and willing to participate for return to PLOF. Pt family present during evaluation; they are caring and provide good support to pt. Pt previously with 3 hours of assist for ADL's and mobility per day and receiving HHPT 2x per week. However, pt was ModI with amb, using RW in and out of the home, prior to admission. Currently, pt performs bed mobility with MaxA +2 for physical assist, requiring multiple attempts; primarily due to high pain levels to low back. Tranfers performed with MinA +2, though requires increased time and elevates pain levels tremendously. Amb deferred due to high levels of pain. Pt presents with the following deficits: strength, endurance, balance, and pain. Pt would benefit from skilled PT to address the previously mentioned impairments and promote return to PLOF. Pt is far from baseline and would benefit from higher level PT services; currently, recommending SNF, pending d/c. This entire session was guided, instructed, and directly supervised by Greggory Stallion, DPT.      Follow Up Recommendations SNF    Equipment Recommendations  None recommended by PT    Recommendations for Other Services       Precautions / Restrictions Precautions Precautions: Fall;Back Precaution Booklet Issued: No Precaution Comments: no bend, lift, twist to lumbar spine Restrictions Weight Bearing Restrictions: No      Mobility  Bed Mobility Overal bed  mobility: Needs Assistance Bed Mobility: Supine to Sit;Sit to Supine     Supine to sit: Max assist;+2 for physical assistance Sit to supine: Max assist;+2 for physical assistance   General bed mobility comments: Pt education and assisted in perofrming log rolling technique to the L for supine to sit. Required cues for hand placement, foot placement, and generalized mechancis and safety. Educated pt on low back precautions. Pt requried maxA +2 for supien to sit (requiring x2 attempts) to come to seated position. Pt screamed out in pain when coming to sit, and visibly shaking due to pain. Also required MaxA with sit to supine, and demo good carryover with education on precautions.   Transfers Overall transfer level: Needs assistance Equipment used: Rolling walker (2 wheeled) Transfers: Sit to/from Stand Sit to Stand: Min assist;+2 physical assistance         General transfer comment: Pt minA +2 for STS, primarily limited by pain. Provided min cues for foot placement and mechanics. Pt with good carryover, though required increased time to perform task. Pt visibly shaking from pain upon standing.   Ambulation/Gait             General Gait Details: Deferred due to high pain levels.   Stairs            Wheelchair Mobility    Modified Rankin (Stroke Patients Only)       Balance Overall balance assessment: Needs assistance Sitting-balance support: Feet supported;Bilateral upper extremity supported Sitting balance-Leahy Scale: Fair Sitting balance - Comments: Pt initially required minA to maintain sitting balance, with tendency to lose balance to the R. Upon cueing pt  to sit with erect posture, he was able to progress to CGA.    Standing balance support: Bilateral upper extremity supported Standing balance-Leahy Scale: Fair Standing balance comment: Pt with fair standing balance, requiring B UE support and relying heavily on UE to support weight. Pt visibly shaking from pain  and with increased postural sway, but able to self correct. Required MinA +2 assist.                              Pertinent Vitals/Pain Pain Assessment: 0-10 Pain Score: 8  (with sitting at EOB. 10 when coming to sit from supine) Pain Location: low back  Pain Descriptors / Indicators: Constant;Discomfort;Grimacing;Moaning;Guarding Pain Intervention(s): Limited activity within patient's tolerance;Premedicated before session;Monitored during session    [place] expects to be discharged to:: Private residence Living Arrangements: Spouse/significant other Available Help at Discharge: Family Type of Home: House Home Access: [place]: [place]: Environmental consultant - 2 wheels;Walker - 4 wheels      Prior Function Level of Independence: Needs assistance         Comments: Pt receives assist from aid with getting out of bed and with all ADL's for 3 hours per day. Pt receiving HHPT 2x per week. Pt generally amb with RW.      Hand Dominance        Extremity/Trunk Assessment   Upper Extremity Assessment Upper Extremity Assessment: Overall WFL for tasks assessed    Lower Extremity Assessment Lower Extremity Assessment: Generalized weakness (MMT to B LE's grossly 4/5)       Communication   Communication: No difficulties  Cognition Arousal/Alertness: Awake/alert Behavior During Therapy: WFL for tasks assessed/performed Overall Cognitive Status: Within Functional Limits for tasks assessed                                        General Comments      Exercises Other Exercises Other Exercises: supine therex performed to B LE's with supervision x10 reps: ankle pumps, heel slides (reproduced low back pain with R heel slides. Pt unable to perform hip abd, due to increaed pain. Therex was performed with increased time due to pain.    Assessment/Plan    PT Assessment Patient needs continued PT services  PT  Problem List Decreased strength;Decreased activity tolerance;Decreased balance;Decreased mobility;Decreased coordination;Pain;Obesity       PT Treatment Interventions DME instruction;Gait training;Functional mobility training;Therapeutic activities;Therapeutic exercise;Balance training;Neuromuscular re-education;Patient/family education;Manual techniques    PT Goals (Current goals can be found in the Care Plan section)  Acute Rehab PT Goals Patient Stated Goal: to have no pain  PT Goal Formulation: With patient/family Time For Goal Achievement: 07/27/17 Potential to Achieve Goals: Good    Frequency 7X/week   Barriers to discharge        Co-evaluation               AM-PAC PT "6 Clicks" Daily Activity  Outcome Measure Difficulty turning over in bed (including adjusting bedclothes, sheets and blankets)?: Unable Difficulty moving from lying on back to sitting on the side of the bed? : Unable Difficulty sitting down on and standing up from a chair with arms (e.g., wheelchair, bedside commode, etc,.)?: Unable Help needed moving to and from a bed to chair (including a wheelchair)?: Total Help needed walking in hospital room?:  Total Help needed climbing 3-5 steps with a railing? : Total 6 Click Score: 6    End of Session Equipment Utilized During Treatment: Gait belt Activity Tolerance: Patient limited by pain Patient left: in bed;with nursing/sitter in room;with family/visitor present;with call bell/phone within reach Nurse Communication: Mobility status PT Visit Diagnosis: Unsteadiness on feet (R26.81);Other abnormalities of gait and mobility (R26.89);Repeated falls (R29.6);Muscle weakness (generalized) (M62.81);History of falling (Z91.81);Pain    Time: 1735-6701 PT Time Calculation (min) (ACUTE ONLY): 32 min   Charges:         PT G Codes:        Oran Rein PT, SPT  Bevelyn Ngo 07/13/2017, 6:09 PM

## 2017-07-13 NOTE — ED Provider Notes (Addendum)
[facility] Emergency Department Provider Note       Time seen: ----------------------------------------- 11:25 AM on 07/13/2017 -----------------------------------------     I have reviewed the triage vital signs and the nursing notes.   HISTORY   Chief Complaint Back Pain    HPI Eddie Carter is a 76 y.o. male who presents to the ED for back pain from recent falls. Patient states he fell Sunday at the beach and hurt his back. He was able to drive home and fell again yesterday on his knees. He typically walks with a walker. Reports pain 10 out of 10 in his low back. He has had 2 previous back surgeries. He denies any other recent injuries or complaints or illnesses.   Past Medical History:  Diagnosis Date  . Arthritis   . Asthma   . BPH (benign prostatic hyperplasia)   . 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 Complication of anesthesia    after septoplasty and uvulectomy-was in icu- [facility] - 10 yrs. ago  . COPD (chronic obstructive pulmonary disease) ([place])   . Diabetes mellitus without complication ([place])   . ED (erectile dysfunction)   . Full dentures   . GERD (gastroesophageal reflux disease)    no meds  . Headache    difficulty with headaches- 1950's , again in 1967, 2008- again problems with migrraines   . History of GI bleed   . History of urethral stricture   . HOH (hard of hearing)   . Hypogonadism in male   . Joint pain   . Medial meniscus, posterior horn derangement    left knee  . Obesity   . Pneumonia 2014   [facility]  . Shortness of breath   . Skin cancer   . Sleep apnea    uses a cpap, most nights   . Stroke [facility]) 1995   some speech and thought processes slow  . Traumatic tear of lateral meniscus of right knee    right knee  . Urinary frequency   . Urinary incontinence     Patient Active Problem List   Diagnosis Date Noted  . Cephalalgia 01/29/2016  . Adiposity 01/29/2016  . Hypertriglyceridemia 01/29/2016   . H/O respiratory system disease 01/29/2016  . H/O erectile dysfunction 01/29/2016  . Cerebrovascular accident, old 01/29/2016  . Diabetes mellitus ([place]) 01/29/2016  . Chronic obstructive pulmonary disease ([place]) 01/29/2016  . Benign fibroma of prostate 01/29/2016  . Chronic pain 01/29/2016  . 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 Abnormal MRI, lumbar spine 01/29/2016  . Chronic low back pain (Location of Primary Source of Pain) (Bilateral) (L>R) 01/29/2016  . Chronic lower extremity pain (Location of Secondary source of pain) (Bilateral) (L>R) 01/29/2016  . Long term current use of opiate analgesic 01/29/2016  . Long term prescription opiate use 01/29/2016  . Opiate use 01/29/2016  . Encounter for therapeutic drug level monitoring 01/29/2016  . Encounter for pain management planning 01/29/2016  . Chronic lumbar radicular pain (Location of Secondary source of pain) (Bilateral) (L4 Dermatome) (L>R) 01/29/2016  . Chronic hip pain (Location of Tertiary source of pain) (Bilateral) (L>R) 01/29/2016  . Chronic knee pain (Bilateral) (L>R) 01/29/2016  . Chronic anticoagulation (Plavix) 01/29/2016  . Erectile dysfunction of organic origin 01/22/2016  . BPH with obstruction/lower urinary tract symptoms 07/24/2015  . Hypogonadism in male 07/24/2015  . HNP (herniated nucleus pulposus), lumbar 11/05/2014    Class: Acute  . Herniated nucleus pulposus, lumbar 11/05/2014  .  Pulmonary nodules 10/25/2013  . ILD (interstitial lung disease) ([place]) 08/22/2013  . Traumatic tear of lateral meniscus of right knee   . Medial meniscus, posterior horn derangement   . COPD (chronic obstructive pulmonary disease) ([place])   . Shortness of breath   . Sleep apnea   . Stroke ([place])   . Arthritis   . GERD (gastroesophageal reflux disease)   . History of GI bleed   . HOH (hard of hearing)   . Full dentures   . Complication of anesthesia     Past Surgical History:  Procedure Laterality Date  .  APPENDECTOMY  1963  . CARPAL TUNNEL RELEASE     left  . COLONOSCOPY    . COLONOSCOPY WITH PROPOFOL N/A 12/07/2016   Procedure: COLONOSCOPY WITH PROPOFOL;  Surgeon: Lollie Sails, MD;  Location: [facility] ENDOSCOPY;  Service: Endoscopy;  Laterality: N/A;  . EYE SURGERY     foreign body removed fr. eye, ? side   . FOOT FOREIGN BODY REMOVAL  1976   left foot -multiple pieces glass  . HEMORROIDECTOMY  1995  . KNEE ARTHROSCOPY Bilateral 01/17/2013   Procedure: ARTHROSCOPY KNEE BILATERAL WITH MEDIAL AND LATERAL MENISECTOMIES;  Surgeon: Lorn Junes, MD;  Location: [place];  Service: Orthopedics;  Laterality: Bilateral;  . LUMBAR LAMINECTOMY N/A 11/05/2014   Procedure: LEFT L3-4 MICRODISCECTOMY;  Surgeon: Jessy Oto, MD;  Location: [place];  Service: Orthopedics;  Laterality: N/A;  . LUMBAR LAMINECTOMY/DECOMPRESSION MICRODISCECTOMY N/A 02/12/2015   Procedure: RE-DO LEFT L3-4 MICRODISCECTOMY;  Surgeon: Jessy Oto, MD;  Location: [place];  Service: Orthopedics;  Laterality: N/A;  . NASAL SEPTUM SURGERY  1995  . TONSILLECTOMY    . UPPER GI ENDOSCOPY    . UVULOPALATOPHARYNGOPLASTY (UPPP)/TONSILLECTOMY/SEPTOPLASTY  1995   same time with septoplasty-ended up ICU almost trached    Allergies Asa [aspirin] and Nsaids  Social History Social History  Substance Use Topics  . Smoking status: Former Smoker    Packs/day: 1.50    Years: 40.00    Types: Cigarettes    Quit date: 01/14/1996  . Smokeless tobacco: Never Used  . Alcohol use Yes     Comment: wine in a month    Review of Systems Constitutional: Negative for fever. Eyes: Negative for vision changes ENT:  Negative for congestion, sore throat Cardiovascular: Negative for chest pain. Respiratory: Negative for shortness of breath. Gastrointestinal: Negative for abdominal pain, vomiting and diarrhea. Genitourinary: Negative for dysuria. Musculoskeletal: Positive for back pain Skin: Negative for rash. Neurological:  Negative for headaches, focal weakness or numbness.  All systems negative/normal/unremarkable except as stated in the HPI  ____________________________________________   PHYSICAL EXAM:  VITAL SIGNS: ED Triage Vitals  Enc Vitals Group     BP 07/13/17 1118 106/73     Pulse Rate 07/13/17 1118 80     Resp 07/13/17 1118 17     Temp 07/13/17 1118 98.2 F (36.8 C)     Temp Source 07/13/17 1118 Oral     SpO2 07/13/17 1118 93 %     Weight --      Height --      Head Circumference --      [facility] Flow --      Pain Score 07/13/17 1117 10     Pain Loc --      Pain Edu? --      Excl. in [place]? --     Constitutional: Alert and oriented. Well appearing and in no distress. Eyes:  Conjunctivae are normal. Normal extraocular movements. ENT   Head: Normocephalic and atraumatic.   Nose: No congestion/rhinnorhea.   Mouth/Throat: Mucous membranes are moist.   Neck: No stridor. Cardiovascular: Normal rate, regular rhythm. No murmurs, rubs, or gallops. Respiratory: Normal respiratory effort without tachypnea nor retractions. Breath sounds are clear and equal bilaterally. No wheezes/rales/rhonchi. Gastrointestinal: Soft and nontender. Normal bowel sounds Musculoskeletal: Nontender with normal range of motion in extremities. No lower extremity tenderness nor edema. Exquisite tenderness in the lumbar spine region Neurologic:  Normal speech and language. No gross focal neurologic deficits are appreciated. No radicular pain, strength and sensation appears to be normal particularly in the lower extremities Skin:  Skin is warm, dry and intact. No rash noted. Psychiatric: Mood and affect are normal. Speech and behavior are normal.  ____________________________________________  ED COURSE:  Pertinent labs & imaging results that were available during my care of the patient were reviewed by me and considered in my medical decision making (see chart for details). Patient presents for recent falls, we  will assess with labs and imaging as indicated.   Procedures ____________________________________________   LABS (pertinent positives/negatives)  Labs Reviewed  CBC WITH DIFFERENTIAL/PLATELET - Abnormal; Notable for the following:       Result Value   WBC 13.8 (*)    RBC 4.39 (*)    HCT 39.8 (*)    RDW 14.7 (*)    Neutro Abs 10.8 (*)    All other components within normal limits  COMPREHENSIVE METABOLIC PANEL - Abnormal; Notable for the following:    Glucose, Bld 124 (*)    Albumin 3.4 (*)    ALT 13 (*)    All other components within normal limits  TROPONIN I  URINALYSIS, COMPLETE (UACMP) WITH MICROSCOPIC   EKG:  Sinus rhythm rate 69 bpm, wide QRS, right bundle branch block, left axis deviation  RADIOLOGY Images were viewed by me  Lumbar spine, thoracic spine x-rays IMPRESSION: 1. The ventricles and sulci are prominent which is nonacute. In the setting of ataxia, recommend correlation for signs of normal pressure hydrocephalus. 2. No acute intracranial abnormality identified. IMPRESSION: Mild L1 superior endplate compression fracture is new since the most recent plain films. No other fracture is identified.  Atherosclerosis. ____________________________________________  FINAL ASSESSMENT AND PLAN  Fall, low back pain, general debility, mild L1 superior endplate compression fracture  Plan: Patient's labs and imaging were dictated above. Patient had presented for recent falls with weakness. He does have chronic back pain and has had increased difficulty walking. Essentially he has been bedridden for the last for 5 days. He does have an acute compression fracture that has required several doses of pain medicine. He will eventually need placement into a rehabilitation facility.   Earleen Newport, MD   Note: This note was generated in part or whole with voice recognition software. Voice recognition is usually quite accurate but there are transcription errors that  can and very often do occur. I apologize for any typographical errors that were not detected and corrected.     Earleen Newport, MD 07/13/17 1427    Earleen Newport, MD 07/13/17 1511    Earleen Newport, MD 07/13/17 [phone]

## 2017-07-13 NOTE — ED Provider Notes (Addendum)
Placement may be an issue for insurance reasons. Currently he is being evaluated by physical therapy and we will begin the paperwork for rehabilitation placement. We are given additional IV dose of pain medicine to help with therapy.   Earleen Newport, MD 07/13/17 [phone]    Earleen Newport, MD 07/13/17 [phone]

## 2017-07-13 NOTE — ED Notes (Signed)
Son [name] with questions [phone]

## 2017-07-13 NOTE — Clinical Social Work Note (Signed)
Clinical Social Work Assessment  Patient Details  Name: Eddie Carter MRN: [id number] Date of Birth: 07-Jul-1941  Date of referral:  07/13/17               Reason for consult:  Facility Placement                Permission sought to share information with:  Chartered certified accountant granted to share information::  Yes, Verbal Permission Granted  Name::      Eddie Carter::   Eddie Carter   Relationship::     Contact Information:     Housing/Transportation Living arrangements for the past 2 months:  [place] of Information:  Patient, Adult Children, Spouse Patient Interpreter Needed:  None Criminal Activity/Legal Involvement Pertinent to Current Situation/Hospitalization:  No - Comment as needed Significant Relationships:  Adult Children, Spouse Lives with:  Spouse Do you feel safe going back to the place where you live?  Yes Need for family participation in patient care:  Yes (Comment)  Care giving concerns:  Patient lives in [place] with his wife Eddie Carter [phone] or [phone].    Social Worker assessment / plan:  Holiday representative (CSW) received SNF consult. PT is recommending SNF. Per ED-P patient will not be admitted to [facility] and needs SNF placement. CSW called RN case manager to discuss home health options at the suggestion of [place] Surveyor, quantity. Per RN case manager patient will be admitted to [facility] on the medical floor under observation status for pain control. CSW met with patient in the ED and his wife Eddie Carter and son Eddie Carter [phone] were at bedside. 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 CSW explained that [place] requires authorization for SNF and not many SNFs are in network with [place]. Son and wife verbalized their understanding and are agreeable to SNF search in [place].   FL2 complete and faxed out. CSW  presented bed offers and wife and son chose [facility]. [facility] liaison is aware of accepted bed offer and will start [employer] authorization when PT is available. CSW discussed private pay at SNF if patient is stable for D/C before [name] makes a determination about SNF with wife and son. CSW will continue to follow and assist as needed.   Employment status:  Therapist, music:  Managed Care PT Recommendations:  [place] / Referral to community resources:  [place]  Patient/Family's Response to care:  Patient's wife and son are agreeable to going to [facility] pending [place].   Patient/Family's Understanding of and Emotional Response to Diagnosis, Current Treatment, and Prognosis:  Patient and his family were very pleasant and thanked CSW for assistance.   Emotional Assessment Appearance:  Appears stated age Attitude/Demeanor/Rapport:    Affect (typically observed):  Accepting, Adaptable, Pleasant Orientation:  Oriented to Self, Oriented to Place, Oriented to  Time, Oriented to Situation Alcohol / Substance use:  Not Applicable Psych involvement (Current and /or in the community):  No (Comment)  Discharge Needs  Concerns to be addressed:  Discharge Planning Concerns Readmission within the last 30 days:  No Current discharge risk:  Dependent with Mobility Barriers to Discharge:  Continued Medical Work up   [employer], Veronia Beets, LCSW 07/13/2017, 5:27 PM

## 2017-07-13 NOTE — Care Management Note (Addendum)
Case Management Note  Patient Details  Name: Eddie Carter MRN: [id number] Date of Birth: April 26, 1941  Subjective/Objective:                  Met with patient, wife and son in ED bed 2 to discuss discharge planning. PT was working with patient during my assessment. Patient is pending Observation bed for pain management and continued PT as patient was only able to stand rating pain 8/10 in Carter. Patient at baseline was able to ambulate with walker and drive however he fell while at beach and caused an injury to his Carter which he already suffers from chronic pain.  Son states that he has been maximum assistance since that fall. Patient states that he is already receiving HHPT [facility] (confirmed) "two times per week now".  He admits that he has not been doing the recommended exercises at home when home health is not available.  PCP is Dr. Lavera Guise. PT is recommending SNF at this time. CSW has been notified. PT is also recommending Hospital bed if patient's insurance will not cover SNF.  Action/Plan: This RNCM has left message for Eddie Carter with [facility] to check status of home health. Referral to [facility] with [facility] to see if Eddie Carter will cover hospital bed for patient when he goes home. I also requested a consult by neurosurgeon as patient has been seen by ortho surgeon without resolution per family. Eddie Carter from [facility] confirming that they are following this patient.    Expected Discharge Date:                  Expected Discharge Plan:     In-House Referral:     Discharge planning Services  CM Consult  Post Acute Care Choice:  Durable Medical Equipment, Home Health Choice offered to:  Patient, Spouse, Adult Children  DME Arranged:  Hospital bed DME Agency:  [place]:  PT, Nurse's Aide, Social Work [employer] Agency:  [facility] [facility] [employer]  Status of Service:  In process, will continue to follow  If discussed at  Long Length of Stay Meetings, dates discussed:    Additional Comments:  Marshell Garfinkel, RN 07/13/2017, 3:29 PM

## 2017-07-13 NOTE — ED Triage Notes (Addendum)
Per EMS pt fell on Sunday at the beach and hurt his back.  Pt was able to drive home and fell again yesterday to his knees.  Pt uses a walker at home.  Pt reports pain 10/10.  Pt is A&Ox4.

## 2017-07-13 NOTE — ED Notes (Signed)
PT at bedside.

## 2017-07-13 NOTE — NC FL2 (Signed)
[place] LEVEL OF CARE SCREENING TOOL     IDENTIFICATION  Patient Name: Eddie Carter Birthdate: 1941/04/09 Sex: male Admission Date (Current Location): 07/13/2017  [place] and [place] Number:  Engineering geologist and Address:  [facility], [address] [place], [place] [address]      Provider Number: [id number]  Attending Physician Name and Address:  Earleen Newport, MD  Relative Name and Phone Number:       Current Level of Care: Hospital Recommended Level of Care: [place] Prior Approval Number:    Date Approved/Denied:   PASRR Number:  [id number])  Discharge Plan: SNF    Current Diagnoses: Patient Active Problem List   Diagnosis Date Noted  . Cephalalgia 01/29/2016  . Adiposity 01/29/2016  . Hypertriglyceridemia 01/29/2016  . H/O respiratory system disease 01/29/2016  . H/O erectile dysfunction 01/29/2016  . Cerebrovascular accident, old 01/29/2016  . Diabetes mellitus ([place]) 01/29/2016  . Chronic obstructive pulmonary disease ([place]) 01/29/2016  . Benign fibroma of prostate 01/29/2016  . Chronic pain 01/29/2016  . 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 Abnormal MRI, lumbar spine 01/29/2016  . Chronic low back pain (Location of Primary Source of Pain) (Bilateral) (L>R) 01/29/2016  . Chronic lower extremity pain (Location of Secondary source of pain) (Bilateral) (L>R) 01/29/2016  . Long term current use of opiate analgesic 01/29/2016  . Long term prescription opiate use 01/29/2016  . Opiate use 01/29/2016  . Encounter for therapeutic drug level monitoring 01/29/2016  . Encounter for pain management planning 01/29/2016  . Chronic lumbar radicular pain (Location of Secondary source of pain) (Bilateral) (L4 Dermatome) (L>R) 01/29/2016  . Chronic hip pain (Location of Tertiary source of pain) (Bilateral) (L>R) 01/29/2016  . Chronic knee pain (Bilateral) (L>R) 01/29/2016  . Chronic anticoagulation  (Plavix) 01/29/2016  . Erectile dysfunction of organic origin 01/22/2016  . BPH with obstruction/lower urinary tract symptoms 07/24/2015  . Hypogonadism in male 07/24/2015  . HNP (herniated nucleus pulposus), lumbar 11/05/2014    Class: Acute  . Herniated nucleus pulposus, lumbar 11/05/2014  . Pulmonary nodules 10/25/2013  . ILD (interstitial lung disease) ([place]) 08/22/2013  . Traumatic tear of lateral meniscus of right knee   . Medial meniscus, posterior horn derangement   . COPD (chronic obstructive pulmonary disease) ([place])   . Shortness of breath   . Sleep apnea   . Stroke ([place])   . Arthritis   . GERD (gastroesophageal reflux disease)   . History of GI bleed   . HOH (hard of hearing)   . Full dentures   . Complication of anesthesia     Orientation RESPIRATION BLADDER Height & Weight     Self, Time, Situation, Place  Normal Continent Weight: 260 lb (117.9 kg) Height:  6' (182.9 cm)  BEHAVIORAL SYMPTOMS/MOOD NEUROLOGICAL BOWEL NUTRITION STATUS   (none)   Continent Diet (Regular Diet )  AMBULATORY STATUS COMMUNICATION OF NEEDS Skin   Extensive Assist Verbally Normal                       Personal Care Assistance Level of Assistance  Bathing, Feeding, Dressing Bathing Assistance: Limited assistance Feeding assistance: Independent Dressing Assistance: Limited assistance     Functional Limitations Info  Sight, Hearing, Speech Sight Info: Adequate Hearing Info: Adequate Speech Info: Adequate    SPECIAL CARE FACTORS FREQUENCY  PT (By licensed PT), OT (By licensed OT)     PT Frequency:  (5) OT Frequency:  (  5)            Contractures      Additional Factors Info  Code Status, Allergies Code Status Info:  (Full Code. ) Allergies Info:  (Asa Aspirin, Nsaids)           Current Medications (07/13/2017):  This is the current hospital active medication list No current facility-administered medications for this encounter.    Current Outpatient  Prescriptions  Medication Sig Dispense Refill  . acetaminophen (TYLENOL) 500 MG tablet Take 1,000 mg by mouth 2 (two) times daily.    Marland Kitchen albuterol (PROVENTIL HFA;VENTOLIN HFA) 108 (90 Base) MCG/ACT inhaler Inhale into the lungs.    . budesonide-formoterol (SYMBICORT) 160-4.5 MCG/ACT inhaler Inhale 2 puffs into the lungs 2 (two) times daily. 3 Inhaler 1  . Cholecalciferol (VITAMIN D) 1000 UNITS capsule Take 3,000 Units by mouth daily.    Marland Kitchen CINNAMON PO Take 4,000 mg by mouth daily at 2 PM.    . clopidogrel (PLAVIX) 75 MG tablet Take 75 mg by mouth daily.    . Coenzyme Q10 (CO Q-10) 100 MG CAPS Take 1 tablet by mouth at bedtime.    . fluticasone (FLONASE) 50 MCG/ACT nasal spray Place 2 sprays into both nostrils daily. 16 g 5  . hydrOXYzine (ATARAX/VISTARIL) 10 MG tablet Take 2 tablets by mouth at bedtime.    Jerrye Beavers Polysacch (ALCORTIN A) 1-2-1 % GEL Apply topically.    Marland Kitchen levothyroxine (SYNTHROID, LEVOTHROID) 50 MCG tablet Take 50 mcg by mouth daily. Take on an empty stomach with a glass of water at least 30-60 minutes before breakfast.    . Multiple Vitamin (MULTIVITAMIN) capsule Take 1 capsule by mouth daily.    . polyethylene glycol (MIRALAX / GLYCOLAX) packet Take 17 g by mouth daily as needed. Reported on 01/21/2016    . RA KRILL OIL 500 MG CAPS Take by mouth.    . tiotropium (SPIRIVA) 18 MCG inhalation capsule Place 1 capsule (18 mcg total) into inhaler and inhale daily. 90 capsule 1  . traMADol (ULTRAM) 50 MG tablet Take 1 tablet (50 mg total) by mouth every 6 (six) hours as needed. 40 tablet 0     Discharge Medications: Please see discharge summary for a list of discharge medications.  Relevant Imaging Results:  Relevant Lab Results:   Additional Information  (SSN: [id number])  Gaynor Ferreras, Veronia Beets, LCSW

## 2017-07-13 NOTE — Clinical Social Work Placement (Signed)
   CLINICAL SOCIAL WORK PLACEMENT  NOTE  Date:  07/13/2017  Patient Details  Name: Eddie Carter MRN: [id number] Date of Birth: 10/20/1941  Clinical Social Work is seeking post-discharge placement for this patient at the [place] level of care (*CSW will initial, date and re-position this form in  chart as items are completed):  Yes   Patient/family provided with [place] Work Department's list of facilities offering this level of care within the geographic area requested by the patient (or if unable, by the patient's family).  Yes   Patient/family informed of their freedom to choose among providers that offer the needed level of care, that participate in Medicare, Medicaid or managed care program needed by the patient, have an available bed and are willing to accept the patient.  Yes   Patient/family informed of [facility]'s ownership interest in [facility] and [facility], as well as of the fact that they are under no obligation to receive care at these facilities.  PASRR submitted to EDS on       PASRR number received on       Existing PASRR number confirmed on 07/13/17     FL2 transmitted to all facilities in geographic area requested by pt/family on 07/13/17     FL2 transmitted to all facilities within larger geographic area on       Patient informed that his/her managed care company has contracts with or will negotiate with certain facilities, including the following:        Yes   Patient/family informed of bed offers received.  Patient chooses bed at  ([facility] )     Physician recommends and patient chooses bed at      Patient to be transferred to   on  .  Patient to be transferred to facility by       Patient family notified on   of transfer.  Name of family member notified:        PHYSICIAN       Additional Comment:    _______________________________________________ Mirka [place]bone, Veronia Beets, LCSW 07/13/2017, 5:26 PM

## 2017-07-13 NOTE — H&P (Signed)
Eddie Carter at [place] NAME: Eddie Carter    MR#:  [id number]  DATE OF BIRTH:  1941/10/17  DATE OF ADMISSION:  07/13/2017  PRIMARY CARE PHYSICIAN: [name], MD   REQUESTING/REFERRING PHYSICIAN: Dr. Jimmye Norman  CHIEF COMPLAINT:   Chief Complaint  Patient presents with  . Back Pain    HISTORY OF PRESENT ILLNESS:  Eddie Carter  is a 76 y.o. male with a known history of COPD, chronic back pain, stroke, hypertension, degenerative disc disease presents to the emergency room complaining of worsening low back pain. [place] and had a fall. Returned home and had one more fall here. Due to worsening pain presented to the emergency room. X-ray shows L1 superior endplate compression fracture. No neurological deficits. He does have chronic urinary incontinence which is unchanged. Chronic constipation. Patient received 2 doses of Percocet along with 4 mg IV morphine. Inadequate pain control. Unable to work with physical therapy due to pain. Patient is being admitted for pain control.  PAST MEDICAL HISTORY:   Past Medical History:  Diagnosis Date  . Arthritis   . Asthma   . BPH (benign prostatic hyperplasia)   . 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 Complication of anesthesia    after septoplasty and uvulectomy-was in icu- [facility] - 10 yrs. ago  . COPD (chronic obstructive pulmonary disease) ([place])   . Diabetes mellitus without complication ([place])   . ED (erectile dysfunction)   . Full dentures   . GERD (gastroesophageal reflux disease)    no meds  . Headache    difficulty with headaches- 1950's , again in 1967, 2008- again problems with migrraines   . History of GI bleed   . History of urethral stricture   . HOH (hard of hearing)   . Hypogonadism in male   . Joint pain   . Medial meniscus, posterior horn derangement    left knee  . Obesity   . Pneumonia 2014   [facility]  . Shortness of breath   . Skin cancer    . Sleep apnea    uses a cpap, most nights   . Stroke [facility]) 1995   some speech and thought processes slow  . Traumatic tear of lateral meniscus of right knee    right knee  . Urinary frequency   . Urinary incontinence     PAST SURGICAL HISTORY:   Past Surgical History:  Procedure Laterality Date  . APPENDECTOMY  1963  . CARPAL TUNNEL RELEASE     left  . COLONOSCOPY    . COLONOSCOPY WITH PROPOFOL N/A 12/07/2016   Procedure: COLONOSCOPY WITH PROPOFOL;  Surgeon: Lollie Sails, MD;  Location: [facility] ENDOSCOPY;  Service: Endoscopy;  Laterality: N/A;  . EYE SURGERY     foreign body removed fr. eye, ? side   . FOOT FOREIGN BODY REMOVAL  1976   left foot -multiple pieces glass  . HEMORROIDECTOMY  1995  . KNEE ARTHROSCOPY Bilateral 01/17/2013   Procedure: ARTHROSCOPY KNEE BILATERAL WITH MEDIAL AND LATERAL MENISECTOMIES;  Surgeon: Lorn Junes, MD;  Location: [place];  Service: Orthopedics;  Laterality: Bilateral;  . LUMBAR LAMINECTOMY N/A 11/05/2014   Procedure: LEFT L3-4 MICRODISCECTOMY;  Surgeon: Jessy Oto, MD;  Location: [place];  Service: Orthopedics;  Laterality: N/A;  . LUMBAR LAMINECTOMY/DECOMPRESSION MICRODISCECTOMY N/A 02/12/2015   Procedure: RE-DO LEFT L3-4 MICRODISCECTOMY;  Surgeon: Jessy Oto, MD;  Location: [place];  Service: Orthopedics;  Laterality: N/A;  . NASAL SEPTUM SURGERY  1995  . TONSILLECTOMY    . UPPER GI ENDOSCOPY    . UVULOPALATOPHARYNGOPLASTY (UPPP)/TONSILLECTOMY/SEPTOPLASTY  1995   same time with septoplasty-ended up ICU almost trached    SOCIAL HISTORY:   Social History  Substance Use Topics  . Smoking status: Former Smoker    Packs/day: 1.50    Years: 40.00    Types: Cigarettes    Quit date: 01/14/1996  . Smokeless tobacco: Never Used  . Alcohol use Yes     Comment: wine in a month    FAMILY HISTORY:   Family History  Problem Relation Age of Onset  . Cancer Mother   . Heart attack Father   . Heart disease Unknown    . Arthritis Unknown   . Prostate cancer Neg Hx   . Kidney disease Neg Hx     DRUG ALLERGIES:   Allergies  Allergen Reactions  . Asa [Aspirin] Shortness Of Breath  . Nsaids Other (See Comments)    Makes him bleed.    REVIEW OF SYSTEMS:   Review of Systems  Constitutional: Positive for malaise/fatigue. Negative for chills, fever and weight loss.  HENT: Negative for hearing loss and nosebleeds.   Eyes: Negative for blurred vision, double vision and pain.  Respiratory: Negative for cough, hemoptysis, sputum production, shortness of breath and wheezing.   Cardiovascular: Negative for chest pain, palpitations, orthopnea and leg swelling.  Gastrointestinal: Negative for abdominal pain, constipation, diarrhea, nausea and vomiting.  Genitourinary: Negative for dysuria and hematuria.  Musculoskeletal: Positive for back pain. Negative for falls and myalgias.  Skin: Negative for rash.  Neurological: Negative for dizziness, tremors, sensory change, speech change, focal weakness, seizures and headaches.  Endo/Heme/Allergies: Does not bruise/bleed easily.  Psychiatric/Behavioral: Negative for depression and memory loss. The patient is not nervous/anxious.     MEDICATIONS AT HOME:   Prior to Admission medications   Medication Sig Start Date End Date Taking? Authorizing Provider  acetaminophen (TYLENOL) 500 MG tablet Take 500 mg by mouth every 8 (eight) hours as needed.    Yes [provider]  albuterol (PROVENTIL HFA;VENTOLIN HFA) 108 (90 Base) MCG/ACT inhaler Inhale into the lungs.   Yes [provider]  budesonide-formoterol (SYMBICORT) 160-4.5 MCG/ACT inhaler Inhale 2 puffs into the lungs 2 (two) times daily. 01/02/14  Yes Juanito Doom, MD  Cholecalciferol (VITAMIN D) 1000 UNITS capsule Take 3,000 Units by mouth daily.   Yes [provider]  clopidogrel (PLAVIX) 75 MG tablet Take 75 mg by mouth daily.   Yes [provider]  Coenzyme Q10 (CO Q-10)  100 MG CAPS Take 1 tablet by mouth at bedtime.   Yes [provider]  dutasteride (AVODART) 0.5 MG capsule Take 1 capsule by mouth daily.   Yes [provider]  fluticasone (FLONASE) 50 MCG/ACT nasal spray Place 2 sprays into both nostrils daily. 01/08/14  Yes Juanito Doom, MD  hydrOXYzine (ATARAX/VISTARIL) 10 MG tablet Take 2 tablets by mouth at bedtime. 06/10/13  Yes [provider]  Iodoquinol-HC-Aloe Polysacch (ALCORTIN A) 1-2-1 % GEL Apply topically.   Yes [provider]  levothyroxine (SYNTHROID, LEVOTHROID) 50 MCG tablet Take 50 mcg by mouth daily. Take on an empty stomach with a glass of water at least 30-60 minutes before breakfast.   Yes [provider]  Multiple Vitamin (MULTIVITAMIN) capsule Take 1 capsule by mouth daily.   Yes [provider]  polyethylene glycol (  MIRALAX / GLYCOLAX) packet Take 17 g by mouth daily as needed. Reported on 01/21/2016   Yes [provider]  solifenacin (VESICARE) 10 MG tablet Take 1 tablet by mouth daily.   Yes [provider]  tamsulosin (FLOMAX) 0.4 MG CAPS capsule Take 1 capsule by mouth daily.   Yes [provider]  tiotropium (SPIRIVA) 18 MCG inhalation capsule Place 1 capsule (18 mcg total) into inhaler and inhale daily. 08/20/14  Yes Juanito Doom, MD  topiramate (TOPAMAX) 200 MG tablet Take 1 tablet by mouth daily.   Yes [provider]  CINNAMON PO Take 4,000 mg by mouth daily at 2 PM.    [provider]  oxyCODONE-acetaminophen (PERCOCET) 7.5-325 MG tablet Take 1 tablet by mouth every 8 (eight) hours as needed for severe pain. 07/13/17 07/13/18  Earleen Newport, MD  RA KRILL OIL 500 MG CAPS Take by mouth.    [provider]  traMADol (ULTRAM) 50 MG tablet Take 1 tablet (50 mg total) by mouth every 6 (six) hours as needed. Patient not taking: Reported on 07/13/2017 04/29/17   Jessy Oto, MD     VITAL SIGNS:  Blood pressure  117/61, pulse 62, temperature 98.2 F (36.8 C), temperature source Oral, resp. rate 17, height 6' (1.829 m), weight 117.9 kg (260 lb), SpO2 (!) 89 %.  PHYSICAL EXAMINATION:  Physical Exam  GENERAL:  76 y.o.-year-old patient lying in the bed  EYES: Pupils equal, round, reactive to light and accommodation. No scleral icterus. Extraocular muscles intact.  HEENT: Head atraumatic, normocephalic. Oropharynx and nasopharynx clear. No oropharyngeal erythema, moist oral mucosa  NECK:  Supple, no jugular venous distention. No thyroid enlargement, no tenderness.  LUNGS: Normal breath sounds bilaterally, no wheezing, rales, rhonchi. No use of accessory muscles of respiration.  CARDIOVASCULAR: S1, S2 normal. No murmurs, rubs, or gallops.  ABDOMEN: Soft, nontender, nondistended. Bowel sounds present. No organomegaly or mass.  EXTREMITIES: No pedal edema, cyanosis, or clubbing. + 2 pedal & radial pulses b/l.   NEUROLOGIC: Cranial nerves II through XII are intact. No focal Motor or sensory deficits appreciated b/l. Pain on passive leg raises. PSYCHIATRIC: The patient is alert and oriented x 3. Good affect.  SKIN: No obvious rash, lesion, or ulcer.   LABORATORY PANEL:   CBC  Recent Labs Lab 07/13/17 1139  WBC 13.8*  HGB 13.6  HCT 39.8*  PLT 181   ------------------------------------------------------------------------------------------------------------------  Chemistries   Recent Labs Lab 07/13/17 1139  NA 140  K 4.0  CL 107  CO2 26  GLUCOSE 124*  BUN 11  CREATININE 0.87  CALCIUM 8.9  AST 20  ALT 13*  ALKPHOS 54  BILITOT 1.0   ------------------------------------------------------------------------------------------------------------------  Cardiac Enzymes  Recent Labs Lab 07/13/17 1139  TROPONINI <0.03   ------------------------------------------------------------------------------------------------------------------  RADIOLOGY:  Dg Thoracic Spine 2 View  Result Date:  07/13/2017 CLINICAL DATA:  Low back pain since falling yesterday and the day before yesterday. Initial encounter. EXAM: THORACIC SPINE 2 VIEWS COMPARISON:  CT chest 12/04/2014. FINDINGS: There is no evidence of thoracic spine fracture. Alignment is normal. No other significant bone abnormalities are identified. IMPRESSION: Negative exam. Electronically Signed   By: Inge Rise M.D.   On: 07/13/2017 12:26   Dg Lumbar Spine 2-3 Views  Result Date: 07/13/2017 CLINICAL DATA:  Low back pain since falling yesterday and the day before yesterday. Initial encounter. EXAM: LUMBAR SPINE - 2-3 VIEW COMPARISON:  Plain films lumbar spine performed at [facility] 03/23/2017. MRI lumbar  spine 05/01/2016. FINDINGS: The patient has a mild superior endplate compression fracture of L1 with vertebral body height loss of approximately 10%. The fracture is new since the prior examinations. Vertebral body height is otherwise maintained. Facet arthropathy and loss of disc space height L5-S1 are seen. Aortic atherosclerosis is noted. IMPRESSION: Mild L1 superior endplate compression fracture is new since the most recent plain films. No other fracture is identified. Atherosclerosis. Electronically Signed   By: Inge Rise M.D.   On: 07/13/2017 12:25   Ct Head Wo Contrast  Result Date: 07/13/2017 CLINICAL DATA:  Pain after fall.  Ataxia. EXAM: CT HEAD WITHOUT CONTRAST TECHNIQUE: Contiguous axial images were obtained from the base of the skull through the vertex without intravenous contrast. COMPARISON:  None. FINDINGS: Brain: No subdural, epidural, or subarachnoid hemorrhage identified. Cerebellum, basal cisterns, and brainstem are normal. Ventricles and sulci are prominent but otherwise unremarkable. Scattered white matter changes identified. No acute cortical ischemia or infarct. No mass effect or midline shift Vascular: No hyperdense vessel or unexpected calcification. Skull: Normal. Negative for fracture or focal  lesion. Sinuses/Orbits: No acute finding. Other: None. IMPRESSION: 1. The ventricles and sulci are prominent which is nonacute. In the setting of ataxia, recommend correlation for signs of normal pressure hydrocephalus. 2. No acute intracranial abnormality identified. Electronically Signed   By: Dorise Bullion III M.D   On: 07/13/2017 14:06     IMPRESSION AND PLAN:   * Acute on chronic low back pain due to fall and L1 superior endplate compression fracture Patient will be in the hospital under observation for pain control. Add oral and IV pain medications. Adequate pain control in the emergency room in spite of receiving Percocet and morphine IV. Physical therapy to work with patient. May need discharge to skilled nursing facility.  * Mild leukocytosis likely from acute stress reaction. No signs of infection. Monitor for fever at this time. Start antibiotics if febrile.  * COPD. Continue home inhalers. Nebulizers when necessary.  * DVT prophylaxis with Lovenox  All the records are reviewed and case discussed with ED provider. Management plans discussed with the patient, family and they are in agreement.  CODE STATUS: FULL CODE  TOTAL TIME TAKING CARE OF THIS PATIENT: 40 minutes.   Hillary Bow R M.D on 07/13/2017 at 3:56 PM  Between 7am to 6pm - Pager - [phone]  After 6pm go to [email or web address] - password [email or web address] [facility]  Office  [phone]  CC: Primary care physician; [name], MD  Note: This dictation was prepared with Dragon dictation along with smaller phrase technology. Any transcriptional errors that result from this process are unintentional.

## 2017-07-14 ENCOUNTER — Observation Stay: Payer: Managed Care, Other (non HMO)

## 2017-07-14 DIAGNOSIS — W19XXXA Unspecified fall, initial encounter: Secondary | ICD-10-CM | POA: Diagnosis not present

## 2017-07-14 DIAGNOSIS — R531 Weakness: Secondary | ICD-10-CM

## 2017-07-14 DIAGNOSIS — S32019A Unspecified fracture of first lumbar vertebra, initial encounter for closed fracture: Secondary | ICD-10-CM | POA: Diagnosis not present

## 2017-07-14 LAB — GLUCOSE, CAPILLARY
GLUCOSE-CAPILLARY: 87 mg/dL (ref 65–99)
GLUCOSE-CAPILLARY: 114 mg/dL — AB (ref 65–99)
Glucose-Capillary: 125 mg/dL — ABNORMAL HIGH (ref 65–99)
Glucose-Capillary: 118 mg/dL — ABNORMAL HIGH (ref 65–99)

## 2017-07-14 NOTE — Progress Notes (Signed)
PT Cancellation Note  Patient Details Name: Eddie Carter MRN: [id number] DOB: 23-May-1941   Cancelled Treatment:    Reason Eval/Treat Not Completed: Medical issues which prohibited therapy. Pt currently with pending imaging for DVT. Will hold therapy at this time. Will re-attempt when medically cleared.   Cybele Maule 07/14/2017, 10:07 AM  Greggory Stallion, PT, DPT [phone]

## 2017-07-14 NOTE — Progress Notes (Signed)
Physical Therapy Treatment Patient Details Name: Eddie Carter MRN: [id number] DOB: 06/23/1941 Today's Date: 07/14/2017    History of Present Illness Pt admitted to ED on 8/21with severe low back pain following x2 falls over the weekend; found to have L1 compression fx. Prior to admission pt required assist with all ADL's and amb with a RW. PMH: arthritis, asthma, BPH, COPD, diabetes mellitus, GERD, GI bleed, HOH, hypogonadism, obesity, pna, SOB, sleep apnea, urinary frequency, and urinary incontinence.     PT Comments    Pt is pleasant and willing to participate, but slightly confused today. Pt performs bed mobility MaxA +2, and tranfers and ambulation with MinA +2 for safety. Pt with reports of n/t today; but unable to verbalize if this is typical or not due to confusion. Pt able to amb total of 5 ft, with B UE support from RW; pt with increased low back pain following amb. Will benefit from further gait training and education on correct technique for amb with RW. Able to participate well with supine therex; would benefit from increasing repetitions, especially with L LE, and eventual progression to seated therex. Pt cont to present with the following deficits: strength, endurance, balance, and pain. Overall, pt responded well to today's treatment with no adverse affects, and is progressing well towards therapy goals. Pt would benefit from skilled PT to address the previously mentioned impairments and promote return to PLOF. Currently recommending SNF, pending d/c.    Follow Up Recommendations  SNF     Equipment Recommendations  None recommended by PT    Recommendations for Other Services       Precautions / Restrictions Precautions Precautions: Fall;Back Precaution Booklet Issued: No Precaution Comments: no bend, lift, twist to lumbar spine Restrictions Weight Bearing Restrictions: No    Mobility  Bed Mobility Overal bed mobility: Needs Assistance Bed Mobility: Supine to Sit     Supine to sit: Max assist;+2 for physical assistance     General bed mobility comments: Pt cont to require MaxA +2 with supine to sit using log rolling technique due, though able to assist more with pushing to erect sitting position. Pt requires min verbal and occassional tactile cues for mechanics and safety. Education on low back precautions when coming to sit. Pt with good carryover, but increased pain when coming to sit.   Transfers Overall transfer level: Needs assistance Equipment used: Rolling walker (2 wheeled) Transfers: Sit to/from Stand Sit to Stand: Min assist;+2 physical assistance         General transfer comment: Cont to require minA +2 for STS, secondary to increased levels of pain to low back. Requires min cues with mechanics and safety. Pt with good carry over, following cues well.   Ambulation/Gait Ambulation/Gait assistance: Min assist;+2 safety/equipment Ambulation Distance (Feet): 5 Feet Assistive device: Rolling walker (2 wheeled) Gait Pattern/deviations: Shuffle     General Gait Details: Pt able to amb 5 ft with MinA +2 for safety; requires min cues for RW navigation, mechancis and safety. Pt with good carryover, though requires increased time to perform task, with shuffling gait pattern.    Stairs            Wheelchair Mobility    Modified Rankin (Stroke Patients Only)       Balance  Cognition Arousal/Alertness: Awake/alert Behavior During Therapy: WFL for tasks assessed/performed Overall Cognitive Status: Difficult to assess                                 General Comments: Pt with slight confusion today. Oriented to place and self, but not to situation. Pt unable to state why he is in the hospital and inconsistent with reports.       Exercises Other Exercises Other Exercises: Supine therex performed to B LE's with supervision x10 reps: ankle pumps, hip abd,  quad sets, and glute sets. Slightly distractibel from confusion, requiring cues to continue therex.     General Comments General comments (skin integrity, edema, etc.): Pt with reports of n/t this session. Unable to state if this is new or typical, due to confusion. Inconsistent reports when assessing light touch to B LE's with pt eyes closed. Inconclusive results due to confusion. Pt advised to report worsening/new sx to therapist.       Pertinent Vitals/Pain Pain Assessment: Faces Pain Score: 8  (With movement.) Pain Location: low back  Pain Descriptors / Indicators: Constant;Discomfort;Grimacing;Moaning;Guarding Pain Intervention(s): Limited activity within patient's tolerance;Monitored during session    Home Living                      Prior Function            PT Goals (current goals can now be found in the care plan section) Acute Rehab PT Goals Patient Stated Goal: to have no pain  PT Goal Formulation: With patient/family Time For Goal Achievement: 07/27/17 Potential to Achieve Goals: Good Progress towards PT goals: Progressing toward goals    Frequency    7X/week      PT Plan Current plan remains appropriate    Co-evaluation              AM-PAC PT "6 Clicks" Daily Activity  Outcome Measure  Difficulty turning over in bed (including adjusting bedclothes, sheets and blankets)?: Unable Difficulty moving from lying on back to sitting on the side of the bed? : Unable Difficulty sitting down on and standing up from a chair with arms (e.g., wheelchair, bedside commode, etc,.)?: Unable Help needed moving to and from a bed to chair (including a wheelchair)?: Total Help needed walking in hospital room?: A Lot Help needed climbing 3-5 steps with a railing? : Total 6 Click Score: 7    End of Session Equipment Utilized During Treatment: Gait belt Activity Tolerance: Patient limited by pain Patient left: with family/visitor present;with call bell/phone  within reach;in chair;with chair alarm set Nurse Communication: Mobility status PT Visit Diagnosis: Unsteadiness on feet (R26.81);Other abnormalities of gait and mobility (R26.89);Repeated falls (R29.6);Muscle weakness (generalized) (M62.81);History of falling (Z91.81);Pain     Time: 1439-1510 PT Time Calculation (min) (ACUTE ONLY): 31 min  Charges:                       G Codes:       Oran Rein PT, SPT   Bevelyn Ngo 07/14/2017, 3:36 PM

## 2017-07-14 NOTE — Progress Notes (Signed)
Clinical Education officer, museum (CSW) sent PT evaluation to [facility] via [facility] today. Per Alger Simons liaison he will start [place] authorization for SNF.   [employer], LCSW [phone]

## 2017-07-14 NOTE — Consult Note (Signed)
Reason for Consult:Low back pain  Referring Physician: Dr. Lestine Box  CC: lower back pain   HPI: Eddie Carter is an 76 y.o. male with a known history of COPD, chronic back pain, stroke, hypertension, degenerative disc disease presents to the emergency room complaining of worsening low back pain. Patient was at the beach 3 days back and had a fall. Returned home and had one more fall here. Due to worsening pain presented to the emergency room. X-ray shows L1 superior endplate compression fracture. No neurological deficits. He does have chronic urinary incontinence which is unchanged. Chronic constipation.  Pt states pain is chronic which is about few yrs old but due to falls believes it has gotten worse.    Past Medical History:  Diagnosis Date  . Arthritis   . Asthma   . BPH (benign prostatic hyperplasia)   . 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 Complication of anesthesia    after septoplasty and uvulectomy-was in icu- [facility] - 10 yrs. ago  . COPD (chronic obstructive pulmonary disease) ([place])   . Diabetes mellitus without complication ([place])   . ED (erectile dysfunction)   . Full dentures   . GERD (gastroesophageal reflux disease)    no meds  . Headache    difficulty with headaches- 1950's , again in 1967, 2008- again problems with migrraines   . History of GI bleed   . History of urethral stricture   . HOH (hard of hearing)   . Hypogonadism in male   . Joint pain   . Medial meniscus, posterior horn derangement    left knee  . Obesity   . Pneumonia 2014   [facility]  . Shortness of breath   . Skin cancer   . Sleep apnea    uses a cpap, most nights   . Stroke [facility]) 1995   some speech and thought processes slow  . Traumatic tear of lateral meniscus of right knee    right knee  . Urinary frequency   . Urinary incontinence     Past Surgical History:  Procedure Laterality Date  . APPENDECTOMY  1963  . CARPAL TUNNEL RELEASE     left  . COLONOSCOPY    . COLONOSCOPY  WITH PROPOFOL N/A 12/07/2016   Procedure: COLONOSCOPY WITH PROPOFOL;  Surgeon: Lollie Sails, MD;  Location: [facility] ENDOSCOPY;  Service: Endoscopy;  Laterality: N/A;  . EYE SURGERY     foreign body removed fr. eye, ? side   . FOOT FOREIGN BODY REMOVAL  1976   left foot -multiple pieces glass  . HEMORROIDECTOMY  1995  . KNEE ARTHROSCOPY Bilateral 01/17/2013   Procedure: ARTHROSCOPY KNEE BILATERAL WITH MEDIAL AND LATERAL MENISECTOMIES;  Surgeon: Lorn Junes, MD;  Location: [place];  Service: Orthopedics;  Laterality: Bilateral;  . LUMBAR LAMINECTOMY N/A 11/05/2014   Procedure: LEFT L3-4 MICRODISCECTOMY;  Surgeon: Jessy Oto, MD;  Location: [place];  Service: Orthopedics;  Laterality: N/A;  . LUMBAR LAMINECTOMY/DECOMPRESSION MICRODISCECTOMY N/A 02/12/2015   Procedure: RE-DO LEFT L3-4 MICRODISCECTOMY;  Surgeon: Jessy Oto, MD;  Location: [place];  Service: Orthopedics;  Laterality: N/A;  . NASAL SEPTUM SURGERY  1995  . TONSILLECTOMY    . UPPER GI ENDOSCOPY    . UVULOPALATOPHARYNGOPLASTY (UPPP)/TONSILLECTOMY/SEPTOPLASTY  1995   same time with septoplasty-ended up ICU almost trached    Family History  Problem Relation Age of Onset  . Cancer Mother   . Heart attack Father   .  Heart disease Unknown   . Arthritis Unknown   . Prostate cancer Neg Hx   . Kidney disease Neg Hx     Social History:  reports that he quit smoking about 21 years ago. His smoking use included Cigarettes. He has a 60.00 pack-year smoking history. He has never used smokeless tobacco. He reports that he drinks alcohol. He reports that he does not use drugs.  Allergies  Allergen Reactions  . Asa [Aspirin] Shortness Of Breath  . Nsaids Other (See Comments)    Makes him bleed.    Medications: I have reviewed the patient's current medications.  ROS: History obtained from the patient  General ROS: negative for - chills, fatigue, fever, night sweats, weight gain or weight loss Psychological  ROS: negative for - behavioral disorder, hallucinations, memory difficulties, mood swings or suicidal ideation Ophthalmic ROS: negative for - blurry vision, double vision, eye pain or loss of vision ENT ROS: negative for - epistaxis, nasal discharge, oral lesions, sore throat, tinnitus or vertigo Allergy and Immunology ROS: negative for - hives or itchy/watery eyes Hematological and Lymphatic ROS: negative for - bleeding problems, bruising or swollen lymph nodes Endocrine ROS: negative for - galactorrhea, hair pattern changes, polydipsia/polyuria or temperature intolerance Respiratory ROS: negative for - cough, hemoptysis, shortness of breath or wheezing Cardiovascular ROS: negative for - chest pain, dyspnea on exertion, edema or irregular heartbeat Gastrointestinal ROS: negative for - abdominal pain, diarrhea, hematemesis, nausea/vomiting or stool incontinence Genito-Urinary ROS: negative for - dysuria, hematuria, incontinence or urinary frequency/urgency Musculoskeletal ROS: negative for - joint swelling or muscular weakness Neurological ROS: as noted in HPI Dermatological ROS: negative for rash and skin lesion changes  Physical Examination: Blood pressure (!) 116/50, pulse 75, temperature 99.5 F (37.5 C), temperature source Oral, resp. rate 16, height 6' (1.829 m), weight 118.2 kg (260 lb 9.3 oz), SpO2 94 %.    Neurological Examination   Mental Status: Alert, oriented, thought content appropriate.  Speech fluent without evidence of aphasia.  Able to follow 3 step commands without difficulty. Cranial Nerves: II: Discs flat bilaterally; Visual fields grossly normal, pupils equal, round, reactive to light and accommodation III,IV, VI: ptosis not present, extra-ocular motions intact bilaterally V,VII: smile symmetric, facial light touch sensation normal bilaterally VIII: hearing normal bilaterally IX,X: gag reflex present XI: bilateral shoulder shrug XII: midline tongue  extension Motor: Right : Upper extremity   5/5    Left:     Upper extremity   5/5  Lower extremity   4/5     Lower extremity   4/5 Tone and bulk:normal tone throughout; no atrophy noted Sensory: Pinprick and light touch intact throughout, bilaterally Deep Tendon Reflexes: 1+ and symmetric throughout Plantars: Right: downgoing   Left: downgoing Cerebellar: normal finger-to-nose, normal rapid alternating movements and normal heel-to-shin test Gait: not tested      Laboratory Studies:   Basic Metabolic Panel:  Recent Labs Lab 07/13/17 1139  NA 140  K 4.0  CL 107  CO2 26  GLUCOSE 124*  BUN 11  CREATININE 0.87  CALCIUM 8.9    Liver Function Tests:  Recent Labs Lab 07/13/17 1139  AST 20  ALT 13*  ALKPHOS 54  BILITOT 1.0  PROT 6.6  ALBUMIN 3.4*   No results for input(s): LIPASE, AMYLASE in the last 168 hours. No results for input(s): AMMONIA in the last 168 hours.  CBC:  Recent Labs Lab 07/13/17 1139  WBC 13.8*  NEUTROABS 10.8*  HGB 13.6  HCT 39.8*  MCV 90.6  PLT 181    Cardiac Enzymes:  Recent Labs Lab 07/13/17 1139  TROPONINI <0.03    BNP: Invalid input(s): POCBNP  CBG:  Recent Labs Lab 07/13/17 1711 07/13/17 2100 07/14/17 0734 07/14/17 1201  GLUCAP 103* 112* 69 125*    Microbiology: Results for orders placed or performed during the hospital encounter of 02/07/15  Surgical pcr screen     Status: None   Collection Time: 02/07/15  2:14 PM  Result Value Ref Range Status   MRSA, PCR NEGATIVE NEGATIVE Final   Staphylococcus aureus NEGATIVE NEGATIVE Final    Comment:        The Xpert SA Assay (FDA approved for NASAL specimens in patients over 89 years of age), is one component of a comprehensive surveillance program.  Test performance has been validated by [facility] for patients greater than or equal to 85 year old. It is not intended to diagnose infection nor to guide or monitor treatment.     Coagulation Studies: No  results for input(s): LABPROT, INR in the last 72 hours.  Urinalysis: No results for input(s): COLORURINE, LABSPEC, PHURINE, GLUCOSEU, HGBUR, BILIRUBINUR, KETONESUR, PROTEINUR, UROBILINOGEN, NITRITE, LEUKOCYTESUR in the last 168 hours.  Invalid input(s): APPERANCEUR  Lipid Panel:     Component Value Date/Time   CHOL 187 01/16/2016 0843   CHOL 159 10/30/2014 0540   TRIG 253 (H) 01/16/2016 0843   TRIG 147 10/30/2014 0540   HDL 26 (L) 01/16/2016 0843   HDL 32 (L) 10/30/2014 0540   CHOLHDL 7.2 (H) 01/16/2016 0843   VLDL 29 10/30/2014 0540   LDLCALC 110 (H) 01/16/2016 0843   LDLCALC 98 10/30/2014 0540    HgbA1C:  Lab Results  Component Value Date   HGBA1C 5.7 (H) 05/31/2017    Urine Drug Screen:  No results found for: LABOPIA, COCAINSCRNUR, LABBENZ, AMPHETMU, THCU, LABBARB  Alcohol Level: No results for input(s): ETH in the last 168 hours.  Other results: EKG: normal EKG, normal sinus rhythm, unchanged from previous tracings.  Imaging: Dg Thoracic Spine 2 View  Result Date: 07/13/2017 CLINICAL DATA:  Low back pain since falling yesterday and the day before yesterday. Initial encounter. EXAM: THORACIC SPINE 2 VIEWS COMPARISON:  CT chest 12/04/2014. FINDINGS: There is no evidence of thoracic spine fracture. Alignment is normal. No other significant bone abnormalities are identified. IMPRESSION: Negative exam. Electronically Signed   By: Inge Rise M.D.   On: 07/13/2017 12:26   Dg Lumbar Spine 2-3 Views  Result Date: 07/13/2017 CLINICAL DATA:  Low back pain since falling yesterday and the day before yesterday. Initial encounter. EXAM: LUMBAR SPINE - 2-3 VIEW COMPARISON:  Plain films lumbar spine performed at [facility] 03/23/2017. MRI lumbar spine 05/01/2016. FINDINGS: The patient has a mild superior endplate compression fracture of L1 with vertebral body height loss of approximately 10%. The fracture is new since the prior examinations. Vertebral body height is  otherwise maintained. Facet arthropathy and loss of disc space height L5-S1 are seen. Aortic atherosclerosis is noted. IMPRESSION: Mild L1 superior endplate compression fracture is new since the most recent plain films. No other fracture is identified. Atherosclerosis. Electronically Signed   By: Inge Rise M.D.   On: 07/13/2017 12:25   Ct Head Wo Contrast  Result Date: 07/13/2017 CLINICAL DATA:  Pain after fall.  Ataxia. EXAM: CT HEAD WITHOUT CONTRAST TECHNIQUE: Contiguous axial images were obtained from the base of the skull through the vertex without intravenous contrast. COMPARISON:  None. FINDINGS: Brain: No subdural,  epidural, or subarachnoid hemorrhage identified. Cerebellum, basal cisterns, and brainstem are normal. Ventricles and sulci are prominent but otherwise unremarkable. Scattered white matter changes identified. No acute cortical ischemia or infarct. No mass effect or midline shift Vascular: No hyperdense vessel or unexpected calcification. Skull: Normal. Negative for fracture or focal lesion. Sinuses/Orbits: No acute finding. Other: None. IMPRESSION: 1. The ventricles and sulci are prominent which is nonacute. In the setting of ataxia, recommend correlation for signs of normal pressure hydrocephalus. 2. No acute intracranial abnormality identified. Electronically Signed   By: Dorise Bullion III M.D   On: 07/13/2017 14:06   Mr Lumbar Spine Wo Contrast  Result Date: 07/14/2017 CLINICAL DATA:  Recent falls with worsening low back pain. L1 compression fracture on radiographs. EXAM: MRI LUMBAR SPINE WITHOUT CONTRAST TECHNIQUE: Multiplanar, multisequence MR imaging of the lumbar spine was performed. No intravenous contrast was administered. COMPARISON:  Lumbar spine radiographs 07/13/2017 and MRI 05/01/2016 FINDINGS: Segmentation:  Standard. Alignment:  Normal. Vertebrae: As seen on recent radiographs, there is a fracture through the L1 superior endplate with approximately 10% vertebral  body height loss and mild marrow edema. Edema mildly extends into the right L1 pedicle. There is no retropulsion. Other vertebral body heights are preserved. A sclerotic focus in the L2 vertebral body is unchanged from the prior MRI. Moderate type 2 endplate changes are again seen at L5-S1. Conus medullaris: Extends to the L1 level and appears normal. Paraspinal and other soft tissues: Postsurgical changes in the posterior lumbar soft tissues. No fluid collection. Disc levels: Disc desiccation throughout the lumbar spine. T12-L1 and L1-2: Negative. L2-3: Mild disc bulging, shallow right foraminal disc protrusion, and mild facet hypertrophy without stenosis, unchanged. L3-4: Prior left laminectomy again noted. Mild disc bulging, a small left subarticular disc extrusion, and mild facet hypertrophy result in mild left lateral recess and mild bilateral neural foraminal stenosis. The disc protrusion has decreased in size with improved lateral recess patency and no clear L4 nerve root impingement. No spinal stenosis. L4-5: Mild disc bulging and facet hypertrophy without significant stenosis, unchanged. L5-S1: Chronic severe disc space narrowing. Circumferential disc bulging, endplate spurring, disc space height loss, and mild facet hypertrophy result in moderate right and mild left neural foraminal stenosis without spinal stenosis, unchanged. IMPRESSION: 1. Recent L1 superior endplate fracture with mild height loss and marrow edema. 2. Decreased size of L3-4 disc extrusion. 3. Unchanged neural foraminal stenosis at L3-4 and L5-S1. Electronically Signed   By: Logan Bores M.D.   On: 07/14/2017 10:33   US Venous Img Lower Bilateral  Result Date: 07/14/2017 CLINICAL DATA:  Bilateral calf pain. EXAM: BILATERAL LOWER EXTREMITY VENOUS DOPPLER ULTRASOUND TECHNIQUE: Gray-scale sonography with graded compression, as well as color Doppler and duplex ultrasound were performed to evaluate the lower extremity deep venous systems  from the level of the common femoral vein and including the common femoral, femoral, profunda femoral, popliteal and calf veins including the posterior tibial, peroneal and gastrocnemius veins when visible. The superficial great saphenous vein was also interrogated. Spectral Doppler was utilized to evaluate flow at rest and with distal augmentation maneuvers in the common femoral, femoral and popliteal veins. COMPARISON:  Chest CT 12/04/2014. FINDINGS: RIGHT LOWER EXTREMITY Common Femoral Vein: No evidence of thrombus. Normal compressibility, respiratory phasicity and response to augmentation. Saphenofemoral Junction: No evidence of thrombus. Normal compressibility and flow on color Doppler imaging. Profunda Femoral Vein: No evidence of thrombus. Normal compressibility and flow on color Doppler imaging. Femoral Vein: No evidence of thrombus. Normal  compressibility, respiratory phasicity and response to augmentation. Popliteal Vein: No evidence of thrombus. Normal compressibility, respiratory phasicity and response to augmentation. Calf Veins: No evidence of thrombus. Normal compressibility and flow on color Doppler imaging. Superficial Great Saphenous Vein: No evidence of thrombus. Normal compressibility and flow on color Doppler imaging. Other Findings:  None. LEFT LOWER EXTREMITY Common Femoral Vein: No evidence of thrombus. Normal compressibility, respiratory phasicity and response to augmentation. Saphenofemoral Junction: No evidence of thrombus. Normal compressibility and flow on color Doppler imaging. Profunda Femoral Vein: No evidence of thrombus. Normal compressibility and flow on color Doppler imaging. Femoral Vein: No evidence of thrombus. Normal compressibility, respiratory phasicity and response to augmentation. Popliteal Vein: No evidence of thrombus. Normal compressibility, respiratory phasicity and response to augmentation. Calf Veins: No evidence of thrombus. Normal compressibility and flow on color  Doppler imaging. Peroneal veins not visualized. Superficial Great Saphenous Vein: No evidence of thrombus. Normal compressibility and flow on color Doppler imaging. Other Findings:  None. IMPRESSION: No DVT noted in either lower extremity. Left peroneal veins not visualized. Electronically Signed   By: Marcello Moores  Register   On: 07/14/2017 11:58     Assessment/Plan:  76 y.o. male with a known history of COPD, chronic back pain, stroke, hypertension, degenerative disc disease presents to the emergency room complaining of worsening low back pain. Patient was at the beach 3 days back and had a fall. Returned home and had one more fall here. Due to worsening pain presented to the emergency room. X-ray shows L1 superior endplate compression fracture. No neurological deficits. He does have chronic urinary incontinence which is unchanged. Chronic constipation.  Pt states pain is chronic which is about few yrs old but due to falls believes it has gotten worse.    - Chronic fracture of L1 on X ray. - he needs pain management, out pt imaging with MRI and neurosurgery/orthopedic surgery follow up - No acute imaging/steroids at this time - call with questions.  07/14/2017, 1:40 PM

## 2017-07-14 NOTE — Progress Notes (Signed)
Clinical Social Worker ([place]) received a call from [facility] [phone] ext. [phone], fax: [phone] stating that patient has been approved to D/C from [facility] to [facility] tomorrow. Per [facility] liaison [facility] for his kyphoplasty on Monday 07/19/17 with Dr. Rudene Christians after he has been off the Plavix for 5 days. Patient is aware of above and in agreement with plan. CSW contacted patient's wife Mardene Celeste and made her aware of above and she is in agreement with plan. Attending MD aware of above. CSW will continue to follow and assist as needed.   [employer], LCSW [phone]

## 2017-07-14 NOTE — Progress Notes (Signed)
[facility] [place] NAME: Eddie Carter    MR#:  [id number]  DATE OF BIRTH:  01-12-1941  SUBJECTIVE:  CHIEF COMPLAINT:   Chief Complaint  Patient presents with  . Back Pain  The patient is a 76 year old Caucasian male with past medical history significant for history of chronic back pain, COPD, stroke, hypertension, degenerative disc disease, who presented to the hospital with worsening low back pain since fall on Sunday, 3 days ago, on arrival to emergency room, he was noted to have L1 superior endplate compression fracture. He admits of pain radiating to the buttocks. He admits of urinary incontinence and retention, for which he needs to have intermittent catheterizations. MRI of lumbar spine revealed the same, mild marrow edema. Discussed with Dr. Rudene Christians, may benefit from kyphoplasty, however, needs Plavix washout  Review of Systems  Musculoskeletal: Positive for back pain and falls.    VITAL SIGNS: Blood pressure (!) 116/50, pulse 75, temperature 99.5 F (37.5 C), temperature source Oral, resp. rate 16, height 6' (1.829 m), weight 118.2 kg (260 lb 9.3 oz), SpO2 94 %.  PHYSICAL EXAMINATION:   GENERAL:  76 y.o.-year-old patient lying in the bed with no acute distress.  EYES: Pupils equal, round, reactive to light and accommodation. No scleral icterus. Extraocular muscles intact.  HEENT: Head atraumatic, normocephalic. Oropharynx and nasopharynx clear.  NECK:  Supple, no jugular venous distention. No thyroid enlargement, no tenderness.  LUNGS: Normal breath sounds bilaterally, no wheezing, rales,rhonchi or crepitation. No use of accessory muscles of respiration.  CARDIOVASCULAR: S1, S2 normal. No murmurs, rubs, or gallops.  ABDOMEN: Soft, nontender, nondistended. Bowel sounds present. No organomegaly or mass.  EXTREMITIES: No pedal edema, cyanosis, or clubbing. Painful calf palpation  bilaterally NEUROLOGIC: Cranial nerves II through  XII are intact. Muscle strength 5/5 in upper extremities, painful to move her lower extremities due to worsening low back pain. Sensation is grossly intact. Gait not checked. Increased bilateral. Deep tendon reflexes in lower extremities, clonus in bilateral feet PSYCHIATRIC: The patient is alert and oriented x 3.  SKIN: No obvious rash, lesion, or ulcer.   ORDERS/RESULTS REVIEWED:   CBC  Recent Labs Lab 07/13/17 1139  WBC 13.8*  HGB 13.6  HCT 39.8*  PLT 181  MCV 90.6  MCH 31.0  MCHC 34.3  RDW 14.7*  LYMPHSABS 1.7  MONOABS 0.8  EOSABS 0.4  BASOSABS 0.1   ------------------------------------------------------------------------------------------------------------------  Chemistries   Recent Labs Lab 07/13/17 1139  NA 140  K 4.0  CL 107  CO2 26  GLUCOSE 124*  BUN 11  CREATININE 0.87  CALCIUM 8.9  AST 20  ALT 13*  ALKPHOS 54  BILITOT 1.0   ------------------------------------------------------------------------------------------------------------------ estimated creatinine clearance is 97.3 mL/min (by C-G formula based on SCr of 0.87 mg/dL). ------------------------------------------------------------------------------------------------------------------ No results for input(s): TSH, T4TOTAL, T3FREE, THYROIDAB in the last 72 hours.  Invalid input(s): FREET3  Cardiac Enzymes  Recent Labs Lab 07/13/17 1139  TROPONINI <0.03   ------------------------------------------------------------------------------------------------------------------ Invalid input(s): POCBNP ---------------------------------------------------------------------------------------------------------------  RADIOLOGY: Dg Thoracic Spine 2 View  Result Date: 07/13/2017 CLINICAL DATA:  Low back pain since falling yesterday and the day before yesterday. Initial encounter. EXAM: THORACIC SPINE 2 VIEWS COMPARISON:  CT chest 12/04/2014. FINDINGS: There is no evidence of thoracic spine fracture.  Alignment is normal. No other significant bone abnormalities are identified. IMPRESSION: Negative exam. Electronically Signed   By: Inge Rise M.D.   On: 07/13/2017 12:26   Dg Lumbar Spine 2-3 Views  Result Date: 07/13/2017 CLINICAL DATA:  Low back pain since falling yesterday and the day before yesterday. Initial encounter. EXAM: LUMBAR SPINE - 2-3 VIEW COMPARISON:  Plain films lumbar spine performed at [facility] 03/23/2017. MRI lumbar spine 05/01/2016. FINDINGS: The patient has a mild superior endplate compression fracture of L1 with vertebral body height loss of approximately 10%. The fracture is new since the prior examinations. Vertebral body height is otherwise maintained. Facet arthropathy and loss of disc space height L5-S1 are seen. Aortic atherosclerosis is noted. IMPRESSION: Mild L1 superior endplate compression fracture is new since the most recent plain films. No other fracture is identified. Atherosclerosis. Electronically Signed   By: Inge Rise M.D.   On: 07/13/2017 12:25   Ct Head Wo Contrast  Result Date: 07/13/2017 CLINICAL DATA:  Pain after fall.  Ataxia. EXAM: CT HEAD WITHOUT CONTRAST TECHNIQUE: Contiguous axial images were obtained from the base of the skull through the vertex without intravenous contrast. COMPARISON:  None. FINDINGS: Brain: No subdural, epidural, or subarachnoid hemorrhage identified. Cerebellum, basal cisterns, and brainstem are normal. Ventricles and sulci are prominent but otherwise unremarkable. Scattered white matter changes identified. No acute cortical ischemia or infarct. No mass effect or midline shift Vascular: No hyperdense vessel or unexpected calcification. Skull: Normal. Negative for fracture or focal lesion. Sinuses/Orbits: No acute finding. Other: None. IMPRESSION: 1. The ventricles and sulci are prominent which is nonacute. In the setting of ataxia, recommend correlation for signs of normal pressure hydrocephalus. 2. No acute  intracranial abnormality identified. Electronically Signed   By: Dorise Bullion III M.D   On: 07/13/2017 14:06   Mr Lumbar Spine Wo Contrast  Result Date: 07/14/2017 CLINICAL DATA:  Recent falls with worsening low back pain. L1 compression fracture on radiographs. EXAM: MRI LUMBAR SPINE WITHOUT CONTRAST TECHNIQUE: Multiplanar, multisequence MR imaging of the lumbar spine was performed. No intravenous contrast was administered. COMPARISON:  Lumbar spine radiographs 07/13/2017 and MRI 05/01/2016 FINDINGS: Segmentation:  Standard. Alignment:  Normal. Vertebrae: As seen on recent radiographs, there is a fracture through the L1 superior endplate with approximately 10% vertebral body height loss and mild marrow edema. Edema mildly extends into the right L1 pedicle. There is no retropulsion. Other vertebral body heights are preserved. A sclerotic focus in the L2 vertebral body is unchanged from the prior MRI. Moderate type 2 endplate changes are again seen at L5-S1. Conus medullaris: Extends to the L1 level and appears normal. Paraspinal and other soft tissues: Postsurgical changes in the posterior lumbar soft tissues. No fluid collection. Disc levels: Disc desiccation throughout the lumbar spine. T12-L1 and L1-2: Negative. L2-3: Mild disc bulging, shallow right foraminal disc protrusion, and mild facet hypertrophy without stenosis, unchanged. L3-4: Prior left laminectomy again noted. Mild disc bulging, a small left subarticular disc extrusion, and mild facet hypertrophy result in mild left lateral recess and mild bilateral neural foraminal stenosis. The disc protrusion has decreased in size with improved lateral recess patency and no clear L4 nerve root impingement. No spinal stenosis. L4-5: Mild disc bulging and facet hypertrophy without significant stenosis, unchanged. L5-S1: Chronic severe disc space narrowing. Circumferential disc bulging, endplate spurring, disc space height loss, and mild facet hypertrophy  result in moderate right and mild left neural foraminal stenosis without spinal stenosis, unchanged. IMPRESSION: 1. Recent L1 superior endplate fracture with mild height loss and marrow edema. 2. Decreased size of L3-4 disc extrusion. 3. Unchanged neural foraminal stenosis at L3-4 and L5-S1. Electronically Signed   By: Eddie Bars.D.  On: 07/14/2017 10:33   US Venous Img Lower Bilateral  Result Date: 07/14/2017 CLINICAL DATA:  Bilateral calf pain. EXAM: BILATERAL LOWER EXTREMITY VENOUS DOPPLER ULTRASOUND TECHNIQUE: Gray-scale sonography with graded compression, as well as color Doppler and duplex ultrasound were performed to evaluate the lower extremity deep venous systems from the level of the common femoral vein and including the common femoral, femoral, profunda femoral, popliteal and calf veins including the posterior tibial, peroneal and gastrocnemius veins when visible. The superficial great saphenous vein was also interrogated. Spectral Doppler was utilized to evaluate flow at rest and with distal augmentation maneuvers in the common femoral, femoral and popliteal veins. COMPARISON:  Chest CT 12/04/2014. FINDINGS: RIGHT LOWER EXTREMITY Common Femoral Vein: No evidence of thrombus. Normal compressibility, respiratory phasicity and response to augmentation. Saphenofemoral Junction: No evidence of thrombus. Normal compressibility and flow on color Doppler imaging. Profunda Femoral Vein: No evidence of thrombus. Normal compressibility and flow on color Doppler imaging. Femoral Vein: No evidence of thrombus. Normal compressibility, respiratory phasicity and response to augmentation. Popliteal Vein: No evidence of thrombus. Normal compressibility, respiratory phasicity and response to augmentation. Calf Veins: No evidence of thrombus. Normal compressibility and flow on color Doppler imaging. Superficial Great Saphenous Vein: No evidence of thrombus. Normal compressibility and flow on color Doppler imaging.  Other Findings:  None. LEFT LOWER EXTREMITY Common Femoral Vein: No evidence of thrombus. Normal compressibility, respiratory phasicity and response to augmentation. Saphenofemoral Junction: No evidence of thrombus. Normal compressibility and flow on color Doppler imaging. Profunda Femoral Vein: No evidence of thrombus. Normal compressibility and flow on color Doppler imaging. Femoral Vein: No evidence of thrombus. Normal compressibility, respiratory phasicity and response to augmentation. Popliteal Vein: No evidence of thrombus. Normal compressibility, respiratory phasicity and response to augmentation. Calf Veins: No evidence of thrombus. Normal compressibility and flow on color Doppler imaging. Peroneal veins not visualized. Superficial Great Saphenous Vein: No evidence of thrombus. Normal compressibility and flow on color Doppler imaging. Other Findings:  None. IMPRESSION: No DVT noted in either lower extremity. Left peroneal veins not visualized. Electronically Signed   By: Marcello Moores  Register   On: 07/14/2017 11:58    EKG:  Orders placed or performed during the hospital encounter of 07/13/17  . ED EKG  . ED EKG    ASSESSMENT AND PLAN:  Active Problems:   Low back pain  #1. Low back pain due to L1 compression fraction, discussed with Dr. Westley Foots, orthopedic surgeon, may benefit from a kyphoplasty, but needs Plavix washout of about 5 days him a Plavix is suspended, continue pain medications, physical therapy, appreciate neurologist input, skilled nursing facility for rehabilitation was recommended #2. Bilateral Calf pain, no DVT on ultrasound #3. Diabetes mellitus, continue sliding-scale insulin, blood glucose level is ranging between 87-125, continue diabetic diet #4. Leukocytosis, likely stress related, follow whether cell count in the morning, get urinalysis, cultures if needed  Management plans discussed with the patient, family and they are in agreement.   DRUG ALLERGIES:  Allergies    Allergen Reactions  . Asa [Aspirin] Shortness Of Breath  . Nsaids Other (See Comments)    Makes him bleed.    CODE STATUS:     Code Status Orders        Start     Ordered   07/13/17 1555  Full code  Continuous     07/13/17 1555    Code Status History    Date Active Date Inactive Code Status Order ID Comments User Context   02/12/2015  8:10 PM 02/14/2015  8:20 PM Full Code [id number]  Jessy Oto, MD Inpatient   11/05/2014  1:29 PM 11/07/2014  4:55 PM Full Code [id number]  Jessy Oto, MD Inpatient    Advance Directive Documentation     Most Recent Value  Type of Advance Directive  Healthcare Power of Attorney, Living will  Pre-existing out of facility DNR order (yellow form or pink MOST form)  -  "MOST" Form in Place?  -      TOTAL TIME TAKING CARE OF THIS PATIENT: 35 minutes.    Theodoro Grist M.D on 07/14/2017 at 2:08 PM  Between 7am to 6pm - Pager - [phone]  After 6pm go to [email or web address] - password [email or web address] [facility]  Office  [phone]  CC: Primary care physician; [name], MD

## 2017-07-14 NOTE — Consult Note (Addendum)
Patient is a 76 year old was confused and present and says he fell injuring his back with a bookshelf last Labor Day. He has had an MRI that shows an L1 compression fracture. On exam he is very tender to palpation at L1 and nontender above and below this. He has no clonus.   Impression is L1 compression fracture with severe pain  Recommendation is for kyphoplasty at L1. I hope to discuss this with him in more detail when he is less confused and also with his wife present. He has been on Plavix was held today and will be Monday before he could have the procedure. I will tentatively schedule him for then.  He needs to stay off his Plavix. He can take Lovenox but it needs to be stopped on Sunday for procedure on Monday

## 2017-07-15 ENCOUNTER — Encounter: Payer: Self-pay | Admitting: Neurology

## 2017-07-15 ENCOUNTER — Observation Stay: Payer: Managed Care, Other (non HMO)

## 2017-07-15 DIAGNOSIS — S32019A Unspecified fracture of first lumbar vertebra, initial encounter for closed fracture: Secondary | ICD-10-CM | POA: Diagnosis not present

## 2017-07-15 DIAGNOSIS — S32010A Wedge compression fracture of first lumbar vertebra, initial encounter for closed fracture: Secondary | ICD-10-CM

## 2017-07-15 DIAGNOSIS — M79661 Pain in right lower leg: Secondary | ICD-10-CM

## 2017-07-15 DIAGNOSIS — T17908A Unspecified foreign body in respiratory tract, part unspecified causing other injury, initial encounter: Secondary | ICD-10-CM

## 2017-07-15 DIAGNOSIS — R062 Wheezing: Secondary | ICD-10-CM

## 2017-07-15 DIAGNOSIS — M79662 Pain in left lower leg: Secondary | ICD-10-CM

## 2017-07-15 DIAGNOSIS — D72829 Elevated white blood cell count, unspecified: Secondary | ICD-10-CM

## 2017-07-15 DIAGNOSIS — M549 Dorsalgia, unspecified: Secondary | ICD-10-CM

## 2017-07-15 LAB — URINALYSIS, COMPLETE (UACMP) WITH MICROSCOPIC
BACTERIA UA: NONE SEEN
Bilirubin Urine: NEGATIVE
Glucose, UA: NEGATIVE mg/dL
HGB URINE DIPSTICK: NEGATIVE
KETONES UR: NEGATIVE mg/dL
Leukocytes, UA: NEGATIVE
Nitrite: NEGATIVE
PROTEIN: NEGATIVE mg/dL
Specific Gravity, Urine: 1.015 (ref 1.005–1.030)
pH: 6 (ref 5.0–8.0)

## 2017-07-15 LAB — CBC
HEMATOCRIT: 40.6 % (ref 40.0–52.0)
HEMOGLOBIN: 13.8 g/dL (ref 13.0–18.0)
MCH: 31.3 pg (ref 26.0–34.0)
MCHC: 34 g/dL (ref 32.0–36.0)
MCV: 91.9 fL (ref 80.0–100.0)
Platelets: 163 10*3/uL (ref 150–440)
RBC: 4.42 MIL/uL (ref 4.40–5.90)
RDW: 14.7 % — AB (ref 11.5–14.5)
WBC: 12 10*3/uL — AB (ref 3.8–10.6)

## 2017-07-15 LAB — SURGICAL PCR SCREEN
MRSA, PCR: NEGATIVE
STAPHYLOCOCCUS AUREUS: NEGATIVE

## 2017-07-15 LAB — GLUCOSE, CAPILLARY
GLUCOSE-CAPILLARY: 125 mg/dL — AB (ref 65–99)
Glucose-Capillary: 146 mg/dL — ABNORMAL HIGH (ref 65–99)

## 2017-07-15 MED ORDER — FUROSEMIDE 10 MG/ML IJ SOLN: 20.0000 mg | Freq: Once | INTRAMUSCULAR | Status: DC

## 2017-07-15 MED ORDER — OXYCODONE-ACETAMINOPHEN 7.5-325 MG PO TABS: 1.0000 | ORAL_TABLET | Freq: Three times a day (TID) | ORAL | 0 refills | Status: AC | PRN

## 2017-07-15 MED ORDER — PHENOL 1.4 % MT LIQD
1.0000 | OROMUCOSAL | Status: DC | PRN
  Filled 2017-07-15: qty 177

## 2017-07-15 MED ORDER — TRAMADOL HCL 50 MG PO TABS: 50.0000 mg | ORAL_TABLET | Freq: Four times a day (QID) | ORAL | 0 refills | Status: DC | PRN

## 2017-07-15 MED ORDER — SENNA 8.6 MG PO TABS: 1.0000 | ORAL_TABLET | Freq: Two times a day (BID) | ORAL | 0 refills | Status: DC

## 2017-07-15 MED ORDER — DOCUSATE SODIUM 100 MG PO CAPS: 100.0000 mg | ORAL_CAPSULE | Freq: Two times a day (BID) | ORAL | 0 refills | Status: DC

## 2017-07-15 NOTE — Care Management Note (Addendum)
Case Management Note  Patient Details  Name: Eddie APPLING MRN: [id number] Date of Birth: 08-Sep-1941  Subjective/Objective:  Patient discharging to [facility] today.                    Action/Plan: [facility] notified of discharge  Expected Discharge Date:                  Expected Discharge Plan:  [place]  In-House Referral:     Discharge planning Services  CM Consult  Post Acute Care Choice:  Home Health Choice offered to:     DME Arranged:    DME Agency:     HH Arranged:    [facility] Agency:     Status of Service:  Completed, signed off  If discussed at [employer], dates discussed:    Additional Comments:  Jolly Mango, RN 07/15/2017, 9:45 AM

## 2017-07-15 NOTE — Progress Notes (Signed)
Report was called to [facility]. EMS was also called. Patient has no complaints or SS of distress. Will continue to monitor.

## 2017-07-15 NOTE — Care Management Important Message (Signed)
Important Message  Patient Details  Name: Eddie Carter MRN: [id number] Date of Birth: Mar 14, 1941   Medicare Important Message Given:  N/A - LOS <3 / Initial given by admissions    Jolly Mango, RN 07/15/2017, 1:32 PM

## 2017-07-15 NOTE — Discharge Summary (Addendum)
[facility] NAME: Eddie Carter    MR#:  [id number]  DATE OF BIRTH:  09-28-1941  DATE OF ADMISSION:  07/13/2017 ADMITTING PHYSICIAN: Hillary Bow, MD  DATE OF DISCHARGE: No discharge date for patient encounter.  PRIMARY CARE PHYSICIAN: [name], MD     ADMISSION DIAGNOSIS:  Weakness [R53.1] Fall, initial encounter [W19.XXXA] Closed compression fracture of first lumbar vertebra, initial encounter ([place]) [S32.010A]  DISCHARGE DIAGNOSIS:  Active Problems:   Low back pain   Closed compression fracture of L1 lumbar vertebra (HCC)   Back pain   Aspiration into airway   Wheezing   Leukocytosis   Bilateral calf pain   SECONDARY DIAGNOSIS:   Past Medical History:  Diagnosis Date  . Arthritis   . Asthma   . BPH (benign prostatic hyperplasia)   . 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 Complication of anesthesia    after septoplasty and uvulectomy-was in icu- [facility] - 10 yrs. ago  . COPD (chronic obstructive pulmonary disease) ([place])   . Diabetes mellitus without complication ([place])   . ED (erectile dysfunction)   . Full dentures   . GERD (gastroesophageal reflux disease)    no meds  . Headache    difficulty with headaches- 1950's , again in 1967, 2008- again problems with migrraines   . History of GI bleed   . History of urethral stricture   . HOH (hard of hearing)   . Hypogonadism in male   . Joint pain   . Medial meniscus, posterior horn derangement    left knee  . Obesity   . Pneumonia 2014   [facility]  . Shortness of breath   . Skin cancer   . Sleep apnea    uses a cpap, most nights   . Stroke [facility]) 1995   some speech and thought processes slow  . Traumatic tear of lateral meniscus of right knee    right knee  . Urinary frequency   . Urinary incontinence     .pro HOSPITAL COURSE:   The patient is a 76 year old Caucasian male with past medical history significant for history of  chronic back pain, COPD, stroke, hypertension, degenerative disc disease, who presented to the hospital with worsening low back pain since fall on Sunday, 3 days ago, on arrival to emergency room, he was noted to have L1 superior endplate compression fracture. He admited of pain radiating to the buttocks. He admits of chronic urinary incontinence and retention, for which he needs to have intermittent catheterizations. MRI of lumbar spine revealed the same, mild marrow edema. Discussed with Dr. Rudene Christians,  kyphoplasty was recommended, however, the patient needs Plavix . 5 day washout period. The patient was seen by neurologist, who felt that patient had chronic fracture of L1, recommended to continue pain management. Outpatient MRI and neurosurgery/orthopedic surgery follow-up, there was no acute imaging or steroid indication. Patient was evaluated by physical therapist and recommended skilled nursing facility placement. While eating breakfast, August 23,018, he developed cough, aspirated into airways, chest x-rays pending. Oxygenation remained stable at 94% on room air. Patient will be seen by speech therapist, recommendations are pending. Likely discharged to skilled nursing facility for rehabilitation and follow-up with Dr. Rudene Christians as outpatient for kyphoplasty procedure.  Discussion by problem: #1. Low back pain due to L1 compression fraction, discussed with Dr. Rudene Christians, , orthopedic surgeon,  kyphoplasty was recommended, patient was recommended to go to skilled nursing  facility for rehabilitation and return back in 5 days due to Plavix washout period for outpatient kyphoplasty procedure.  Plavix is suspended, the patient is to continue pain medications, physical therapy, appreciate neurologist input, he will be going to skilled nursing facility for rehabilitation today #2. Bilateral Calf pain, no DVT on ultrasound #3. Diabetes mellitus, continue sliding-scale insulin, blood glucose level is ranging between  114 -125,  continue diabetic diet #4. Leukocytosis, likely stress related, follow white blood cell count as outpatient, urinalysis was unremarkable, getting chest x-ray as patient aspirated #5. Dysphagia, speech therapist evaluated. Patient and did not recommend any diet change from soft diet #6. Aspiration to airways, chest x-rays showed no obvious pneumonia but atelectasis. Also edema, small pleural effusion, initiate patient on antibiotic therapy for aspiration pneumonitis if he  develops any fever, cough, phlegm production or dyspnea as outpatient. He is asymptomatic at present.  #7. COPD, wheezing, chest x-ray revealed  Cardiomegaly, mild bibasilar atelectasis. Mild bibasilar infiltrates/edema cannot be excluded. Small left pleural effusion cannot be excluded. Patient will be given 1 dose of Lasix intravenously, may benefit from echocardiogram as outpatient, follow for signs of infection  DISCHARGE CONDITIONS:   Stable  CONSULTS OBTAINED:  Treatment Team:  Alexis Goodell, MD Hessie Knows, MD  DRUG ALLERGIES:   Allergies  Allergen Reactions  . Asa [Aspirin] Shortness Of Breath  . Nsaids Other (See Comments)    Makes him bleed.    DISCHARGE MEDICATIONS:   Current Discharge Medication List    START taking these medications   Details  docusate sodium (COLACE) 100 MG capsule Take 1 capsule (100 mg total) by mouth 2 (two) times daily. Qty: 10 capsule, Refills: 0    oxyCODONE-acetaminophen (PERCOCET) 7.5-325 MG tablet Take 1 tablet by mouth every 8 (eight) hours as needed for severe pain. Qty: 20 tablet, Refills: 0    senna (SENOKOT) 8.6 MG TABS tablet Take 1 tablet (8.6 mg total) by mouth 2 (two) times daily. Qty: 120 each, Refills: 0      CONTINUE these medications which have CHANGED   Details  traMADol (ULTRAM) 50 MG tablet Take 1 tablet (50 mg total) by mouth every 6 (six) hours as needed for moderate pain. Qty: 40 tablet, Refills: 0   Associated Diagnoses: Right shoulder pain,  unspecified chronicity; Left shoulder pain, unspecified chronicity      CONTINUE these medications which have NOT CHANGED   Details  acetaminophen (TYLENOL) 500 MG tablet Take 500 mg by mouth every 8 (eight) hours as needed.     albuterol (PROVENTIL HFA;VENTOLIN HFA) 108 (90 Base) MCG/ACT inhaler Inhale into the lungs.    budesonide-formoterol (SYMBICORT) 160-4.5 MCG/ACT inhaler Inhale 2 puffs into the lungs 2 (two) times daily. Qty: 3 Inhaler, Refills: 1    Cholecalciferol (VITAMIN D) 1000 UNITS capsule Take 3,000 Units by mouth daily.    Coenzyme Q10 (CO Q-10) 100 MG CAPS Take 1 tablet by mouth at bedtime.    dutasteride (AVODART) 0.5 MG capsule Take 1 capsule by mouth daily.    fluticasone (FLONASE) 50 MCG/ACT nasal spray Place 2 sprays into both nostrils daily. Qty: 16 g, Refills: 5    hydrOXYzine (ATARAX/VISTARIL) 10 MG tablet Take 2 tablets by mouth at bedtime.    Iodoquinol-HC-Aloe Polysacch (ALCORTIN A) 1-2-1 % GEL Apply topically.    levothyroxine (SYNTHROID, LEVOTHROID) 50 MCG tablet Take 50 mcg by mouth daily. Take on an empty stomach with a glass of water at least 30-60 minutes before breakfast.    Multiple  Vitamin (MULTIVITAMIN) capsule Take 1 capsule by mouth daily.    polyethylene glycol (MIRALAX / GLYCOLAX) packet Take 17 g by mouth daily as needed. Reported on 01/21/2016    solifenacin (VESICARE) 10 MG tablet Take 1 tablet by mouth daily.    tamsulosin (FLOMAX) 0.4 MG CAPS capsule Take 1 capsule by mouth daily.    tiotropium (SPIRIVA) 18 MCG inhalation capsule Place 1 capsule (18 mcg total) into inhaler and inhale daily. Qty: 90 capsule, Refills: 1    topiramate (TOPAMAX) 200 MG tablet Take 1 tablet by mouth daily.    CINNAMON PO Take 4,000 mg by mouth daily at 2 PM.    RA KRILL OIL 500 MG CAPS Take by mouth.      STOP taking these medications     clopidogrel (PLAVIX) 75 MG tablet          DISCHARGE INSTRUCTIONS:    The patient is to follow-up  with primary care physician and orthopedic surgeon, Dr. Rudene Christians as outpatient  If you experience worsening of your admission symptoms, develop shortness of breath, life threatening emergency, suicidal or homicidal thoughts you must seek medical attention immediately by calling 911 or calling your MD immediately  if symptoms less severe.  You Must read complete instructions/literature along with all the possible adverse reactions/side effects for all the Medicines you take and that have been prescribed to you. Take any new Medicines after you have completely understood and accept all the possible adverse reactions/side effects.   Please note  You were cared for by a hospitalist during your hospital stay. If you have any questions about your discharge medications or the care you received while you were in the hospital after you are discharged, you can call the unit and asked to speak with the hospitalist on call if the hospitalist that took care of you is not available. Once you are discharged, your primary care physician will handle any further medical issues. Please note that NO REFILLS for any discharge medications will be authorized once you are discharged, as it is imperative that you return to your primary care physician (or establish a relationship with a primary care physician if you do not have one) for your aftercare needs so that they can reassess your need for medications and monitor your lab values.    Today   CHIEF COMPLAINT:   Chief Complaint  Patient presents with  . Back Pain    HISTORY OF PRESENT ILLNESS:    VITAL SIGNS:  Blood pressure 120/68, pulse 62, temperature 98.9 F (37.2 C), temperature source Oral, resp. rate 20, height 6' (1.829 m), weight 132.5 kg (292 lb 3.2 oz), SpO2 94 %.  I/O:    Intake/Output Summary (Last 24 hours) at 07/15/17 1327 Last data filed at 07/15/17 0443  Gross per 24 hour  Intake                0 ml  Output              100 ml  Net              -100 ml    PHYSICAL EXAMINATION:  GENERAL:  76 y.o.-year-old patient lying in the bed with no acute distress.  EYES: Pupils equal, round, reactive to light and accommodation. No scleral icterus. Extraocular muscles intact.  HEENT: Head atraumatic, normocephalic. Oropharynx and nasopharynx clear.  NECK:  Supple, no jugular venous distention. No thyroid enlargement, no tenderness.  LUNGS: Normal breath sounds bilaterally, no wheezing,  rales,rhonchi or crepitation. No use of accessory muscles of respiration.  CARDIOVASCULAR: S1, S2 normal. No murmurs, rubs, or gallops.  ABDOMEN: Soft, non-tender, non-distended. Bowel sounds present. No organomegaly or mass.  EXTREMITIES: No pedal edema, cyanosis, or clubbing.  NEUROLOGIC: Cranial nerves II through XII are intact. Muscle strength 5/5 in all extremities. Sensation intact. Gait not checked.  PSYCHIATRIC: The patient is alert and oriented x 3.  SKIN: No obvious rash, lesion, or ulcer.   DATA REVIEW:   CBC  Recent Labs Lab 07/15/17 0517  WBC 12.0*  HGB 13.8  HCT 40.6  PLT 163    Chemistries   Recent Labs Lab 07/13/17 1139  NA 140  K 4.0  CL 107  CO2 26  GLUCOSE 124*  BUN 11  CREATININE 0.87  CALCIUM 8.9  AST 20  ALT 13*  ALKPHOS 54  BILITOT 1.0    Cardiac Enzymes  Recent Labs Lab 07/13/17 1139  TROPONINI <0.03    Microbiology Results  Results for orders placed or performed during the hospital encounter of 07/13/17  Surgical pcr screen     Status: None   Collection Time: 07/14/17 11:53 PM  Result Value Ref Range Status   MRSA, PCR NEGATIVE NEGATIVE Final   Staphylococcus aureus NEGATIVE NEGATIVE Final    Comment:        The Xpert SA Assay (FDA approved for NASAL specimens in patients over 73 years of age), is one component of a comprehensive surveillance program.  Test performance has been validated by [facility] for patients greater than or equal to 78 year old. It is not intended to diagnose  infection nor to guide or monitor treatment.     RADIOLOGY:  Ct Head Wo Contrast  Result Date: 07/13/2017 CLINICAL DATA:  Pain after fall.  Ataxia. EXAM: CT HEAD WITHOUT CONTRAST TECHNIQUE: Contiguous axial images were obtained from the base of the skull through the vertex without intravenous contrast. COMPARISON:  None. FINDINGS: Brain: No subdural, epidural, or subarachnoid hemorrhage identified. Cerebellum, basal cisterns, and brainstem are normal. Ventricles and sulci are prominent but otherwise unremarkable. Scattered white matter changes identified. No acute cortical ischemia or infarct. No mass effect or midline shift Vascular: No hyperdense vessel or unexpected calcification. Skull: Normal. Negative for fracture or focal lesion. Sinuses/Orbits: No acute finding. Other: None. IMPRESSION: 1. The ventricles and sulci are prominent which is nonacute. In the setting of ataxia, recommend correlation for signs of normal pressure hydrocephalus. 2. No acute intracranial abnormality identified. Electronically Signed   By: Dorise Bullion III M.D   On: 07/13/2017 14:06   Mr Lumbar Spine Wo Contrast  Result Date: 07/14/2017 CLINICAL DATA:  Recent falls with worsening low back pain. L1 compression fracture on radiographs. EXAM: MRI LUMBAR SPINE WITHOUT CONTRAST TECHNIQUE: Multiplanar, multisequence MR imaging of the lumbar spine was performed. No intravenous contrast was administered. COMPARISON:  Lumbar spine radiographs 07/13/2017 and MRI 05/01/2016 FINDINGS: Segmentation:  Standard. Alignment:  Normal. Vertebrae: As seen on recent radiographs, there is a fracture through the L1 superior endplate with approximately 10% vertebral body height loss and mild marrow edema. Edema mildly extends into the right L1 pedicle. There is no retropulsion. Other vertebral body heights are preserved. A sclerotic focus in the L2 vertebral body is unchanged from the prior MRI. Moderate type 2 endplate changes are again seen  at L5-S1. Conus medullaris: Extends to the L1 level and appears normal. Paraspinal and other soft tissues: Postsurgical changes in the posterior lumbar soft tissues. No fluid  collection. Disc levels: Disc desiccation throughout the lumbar spine. T12-L1 and L1-2: Negative. L2-3: Mild disc bulging, shallow right foraminal disc protrusion, and mild facet hypertrophy without stenosis, unchanged. L3-4: Prior left laminectomy again noted. Mild disc bulging, a small left subarticular disc extrusion, and mild facet hypertrophy result in mild left lateral recess and mild bilateral neural foraminal stenosis. The disc protrusion has decreased in size with improved lateral recess patency and no clear L4 nerve root impingement. No spinal stenosis. L4-5: Mild disc bulging and facet hypertrophy without significant stenosis, unchanged. L5-S1: Chronic severe disc space narrowing. Circumferential disc bulging, endplate spurring, disc space height loss, and mild facet hypertrophy result in moderate right and mild left neural foraminal stenosis without spinal stenosis, unchanged. IMPRESSION: 1. Recent L1 superior endplate fracture with mild height loss and marrow edema. 2. Decreased size of L3-4 disc extrusion. 3. Unchanged neural foraminal stenosis at L3-4 and L5-S1. Electronically Signed   By: Logan Bores M.D.   On: 07/14/2017 10:33   US Venous Img Lower Bilateral  Result Date: 07/14/2017 CLINICAL DATA:  Bilateral calf pain. EXAM: BILATERAL LOWER EXTREMITY VENOUS DOPPLER ULTRASOUND TECHNIQUE: Gray-scale sonography with graded compression, as well as color Doppler and duplex ultrasound were performed to evaluate the lower extremity deep venous systems from the level of the common femoral vein and including the common femoral, femoral, profunda femoral, popliteal and calf veins including the posterior tibial, peroneal and gastrocnemius veins when visible. The superficial great saphenous vein was also interrogated. Spectral Doppler  was utilized to evaluate flow at rest and with distal augmentation maneuvers in the common femoral, femoral and popliteal veins. COMPARISON:  Chest CT 12/04/2014. FINDINGS: RIGHT LOWER EXTREMITY Common Femoral Vein: No evidence of thrombus. Normal compressibility, respiratory phasicity and response to augmentation. Saphenofemoral Junction: No evidence of thrombus. Normal compressibility and flow on color Doppler imaging. Profunda Femoral Vein: No evidence of thrombus. Normal compressibility and flow on color Doppler imaging. Femoral Vein: No evidence of thrombus. Normal compressibility, respiratory phasicity and response to augmentation. Popliteal Vein: No evidence of thrombus. Normal compressibility, respiratory phasicity and response to augmentation. Calf Veins: No evidence of thrombus. Normal compressibility and flow on color Doppler imaging. Superficial Great Saphenous Vein: No evidence of thrombus. Normal compressibility and flow on color Doppler imaging. Other Findings:  None. LEFT LOWER EXTREMITY Common Femoral Vein: No evidence of thrombus. Normal compressibility, respiratory phasicity and response to augmentation. Saphenofemoral Junction: No evidence of thrombus. Normal compressibility and flow on color Doppler imaging. Profunda Femoral Vein: No evidence of thrombus. Normal compressibility and flow on color Doppler imaging. Femoral Vein: No evidence of thrombus. Normal compressibility, respiratory phasicity and response to augmentation. Popliteal Vein: No evidence of thrombus. Normal compressibility, respiratory phasicity and response to augmentation. Calf Veins: No evidence of thrombus. Normal compressibility and flow on color Doppler imaging. Peroneal veins not visualized. Superficial Great Saphenous Vein: No evidence of thrombus. Normal compressibility and flow on color Doppler imaging. Other Findings:  None. IMPRESSION: No DVT noted in either lower extremity. Left peroneal veins not visualized.  Electronically Signed   By: Marcello Moores  Register   On: 07/14/2017 11:58   Dg Chest Port 1 View  Result Date: 07/15/2017 CLINICAL DATA:  Shortness of breath . EXAM: PORTABLE CHEST 1 VIEW COMPARISON:  07/13/2017.  CT 12/04/2014. FINDINGS: Mediastinum and hilar structures normal. Cardiomegaly with normal pulmonary vascularity. Mild bibasilar atelectasis. Mild bibasilar infiltrates/edema cannot be excluded. Small left pleural effusion cannot be excluded. No pneumothorax . IMPRESSION: 1. Cardiomegaly. 2. Mild bibasilar atelectasis.  Mild bibasilar infiltrates/edema cannot be excluded. Small left pleural effusion cannot be excluded. Electronically Signed   By: Marcello Moores  Register   On: 07/15/2017 09:56    EKG:   Orders placed or performed during the hospital encounter of 07/13/17  . ED EKG  . ED EKG      Management plans discussed with the patient, family and they are in agreement.  CODE STATUS:     Code Status Orders        Start     Ordered   07/13/17 1555  Full code  Continuous     07/13/17 1555    Code Status History    Date Active Date Inactive Code Status Order ID Comments User Context   02/12/2015  8:10 PM 02/14/2015  8:20 PM Full Code [id number]  Jessy Oto, MD Inpatient   11/05/2014  1:29 PM 11/07/2014  4:55 PM Full Code [id number]  Jessy Oto, MD Inpatient    Advance Directive Documentation     Most Recent Value  Type of Advance Directive  Healthcare Power of [place], Living will  Pre-existing out of facility DNR order (yellow form or pink MOST form)  -  "MOST" Form in Place?  -      TOTAL TIME TAKING CARE OF THIS PATIENT: 40  minutes.    Theodoro Grist M.D on 07/15/2017 at 1:27 PM  Between 7am to 6pm - Pager - [phone]  After 6pm go to [email or web address] - password [email or web address] [facility]  Office  [phone]  CC: Primary care physician; [name], MD

## 2017-07-15 NOTE — Clinical Social Work Placement (Signed)
   CLINICAL SOCIAL WORK PLACEMENT  NOTE  Date:  07/15/2017  Patient Details  Name: Eddie Carter MRN: [id number] Date of Birth: 1941/08/11  Clinical Social Work is seeking post-discharge placement for this patient at the [place] level of care (*CSW will initial, date and re-position this form in  chart as items are completed):  Yes   Patient/family provided with [facility] Work Department's list of facilities offering this level of care within the geographic area requested by the patient (or if unable, by the patient's family).  Yes   Patient/family informed of their freedom to choose among providers that offer the needed level of care, that participate in Medicare, Medicaid or managed care program needed by the patient, have an available bed and are willing to accept the patient.  Yes   Patient/family informed of [facility]'s ownership interest in [facility] and [facility], as well as of the fact that they are under no obligation to receive care at these facilities.  PASRR submitted to EDS on       PASRR number received on       Existing PASRR number confirmed on 07/13/17     FL2 transmitted to all facilities in geographic area requested by pt/family on 07/13/17     FL2 transmitted to all facilities within larger geographic area on       Patient informed that his/her managed care company has contracts with or will negotiate with certain facilities, including the following:        Yes   Patient/family informed of bed offers received.  Patient chooses bed at  ([facility] )     Physician recommends and patient chooses bed at      Patient to be transferred to  ([facility] ) on 07/15/17.  Patient to be transferred to facility by  [facility] )     Patient family notified on 07/15/17 of transfer.  Name of family member notified:   (Patinet's wife Ezechiel Stooksbury is aware of D/C today. )     PHYSICIAN       Additional Comment:     _______________________________________________ Sherron Mapp, Veronia Beets, LCSW 07/15/2017, 1:50 PM

## 2017-07-15 NOTE — Progress Notes (Signed)
Patient is medically stable for D/C to [facility] today. Per [facility] liaison patient can come today to room 503. [employer] authorization has been received. RN will call report and arrange EMS for transport. Clinical Education officer, museum (CSW) sent D/C orders to [facility] via HUB. Patient is aware of above. CSW contacted patient's wife Dayven Linsley and made her aware of above. Per wife she will bring patient's cpap machine from home to [facility] this evening. Please reconsult if future social work needs arise. CSW signing off.   [employer], LCSW [phone]

## 2017-07-15 NOTE — Evaluation (Signed)
Clinical/Bedside Swallow Evaluation Patient Details  Name: KAMEL HAVEN MRN: [id number] Date of Birth: [birth year]  Today's Date: 07/15/2017 Time: SLP Start Time (ACUTE ONLY): 48 SLP Stop Time (ACUTE ONLY): 1330 SLP Time Calculation (min) (ACUTE ONLY): 60 min  Past Medical History:  Past Medical History:  Diagnosis Date  . Arthritis   . Asthma   . BPH (benign prostatic hyperplasia)   . 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 Complication of anesthesia    after septoplasty and uvulectomy-was in icu- [facility] - 10 yrs. ago  . COPD (chronic obstructive pulmonary disease) ([place])   . Diabetes mellitus without complication ([place])   . ED (erectile dysfunction)   . Full dentures   . GERD (gastroesophageal reflux disease)    no meds  . Headache    difficulty with headaches- 1950's , again in 1967, 2008- again problems with migrraines   . History of GI bleed   . History of urethral stricture   . HOH (hard of hearing)   . Hypogonadism in male   . Joint pain   . Medial meniscus, posterior horn derangement    left knee  . Obesity   . Pneumonia 2014   [facility]  . Shortness of breath   . Skin cancer   . Sleep apnea    uses a cpap, most nights   . Stroke [facility]) 1995   some speech and thought processes slow  . Traumatic tear of lateral meniscus of right knee    right knee  . Urinary frequency   . Urinary incontinence    Past Surgical History:  Past Surgical History:  Procedure Laterality Date  . APPENDECTOMY  1963  . CARPAL TUNNEL RELEASE     left  . COLONOSCOPY    . COLONOSCOPY WITH PROPOFOL N/A 12/07/2016   Procedure: COLONOSCOPY WITH PROPOFOL;  Surgeon: Lollie Sails, MD;  Location: [facility] ENDOSCOPY;  Service: Endoscopy;  Laterality: N/A;  . EYE SURGERY     foreign body removed fr. eye, ? side   . FOOT FOREIGN BODY REMOVAL  1976   left foot -multiple pieces glass  . HEMORROIDECTOMY  1995  . KNEE ARTHROSCOPY Bilateral 01/17/2013   Procedure: ARTHROSCOPY KNEE BILATERAL  WITH MEDIAL AND LATERAL MENISECTOMIES;  Surgeon: Lorn Junes, MD;  Location: [place];  Service: Orthopedics;  Laterality: Bilateral;  . LUMBAR LAMINECTOMY N/A 11/05/2014   Procedure: LEFT L3-4 MICRODISCECTOMY;  Surgeon: Jessy Oto, MD;  Location: [place];  Service: Orthopedics;  Laterality: N/A;  . LUMBAR LAMINECTOMY/DECOMPRESSION MICRODISCECTOMY N/A 02/12/2015   Procedure: RE-DO LEFT L3-4 MICRODISCECTOMY;  Surgeon: Jessy Oto, MD;  Location: [place];  Service: Orthopedics;  Laterality: N/A;  . NASAL SEPTUM SURGERY  1995  . TONSILLECTOMY    . UPPER GI ENDOSCOPY    . UVULOPALATOPHARYNGOPLASTY (UPPP)/TONSILLECTOMY/SEPTOPLASTY  1995   same time with septoplasty-ended up ICU almost trached   HPI:  Pt  is a 76 y.o. male who presents to the ED for back pain from recent falls. Patient states he [place] the beach and hurt his back. He was able to drive home and fell again yesterday on his knees. He typically walks with a walker. Reports pain 10 out of 10 in his low back. He has had 2 previous back surgeries. He denies any other recent injuries or complaints or illnesses. Pt with a known history of COPD, chronic back pain, stroke, hypertension, degenerative disc disease and a multitude of other  medical issues including GERD, HOH, obseity, sleep apnea (CPAP most nights), asthma, and some speech and thought processes determined to be slower per chart note (suspect related to previous CVA).  Pt verbally conversive and follows commands., but also noted min slowness in verbal responses intermittently.    Assessment / Plan / Recommendation Clinical Impression  Pt appeared to present w/ adequate oropharyngeal phase swallow function; he appears at reduced risk for aspiration when following general aspiration precautions, most importantly sitting upright with head forward. Pt was given trials of thin liquids via straw, puree, and solids (slightly moistened) and tolerated each trial w/ no  immediate, overt s/s of aspiration; no decline in vocal quality or respiratory exertion/effort w/ the liquids, puree, or solid. Oral phase appeared grossly wfl with slow mastication and adequate, but timely oral clearing followed. Pt was able to feed himself. Pt educated on general aspiration precautions such as upright positioning, slow pace, and smaller bites. Pt Appears at his baseline with swallow function. Recommend continue current diet w/ general aspiration precautions; recommend general reflux precautions as pt does have h/o of GERD. No further ST services indicated at this time. NSG to reconsult if any change in status. NSG updated.  SLP Visit Diagnosis: Dysphagia, oropharyngeal phase (R13.12)    Aspiration Risk   (reduced risk of aspiration)    Diet Recommendation   Age appropriate regular diet w/ thin liquids; aspiration precautions, reflux precautions   Medication Administration: Whole meds with liquid    Other  Recommendations Recommended Consults:  (none) Oral Care Recommendations: Patient independent with oral care;Oral care BID   Follow up Recommendations None      Frequency and Duration   n/a         Prognosis Prognosis for Safe Diet Advancement: Good Barriers to Reach Goals: Behavior      Swallow Study   General Date of Onset: 07/13/17 HPI: Pt  is a 76 y.o. male who presents to the ED for back pain from recent falls. Patient states he fell Sunday at the beach and hurt his back. He was able to drive home and fell again yesterday on his knees. He typically walks with a walker. Reports pain 10 out of 10 in his low back. He has had 2 previous back surgeries. He denies any other recent injuries or complaints or illnesses. Pt with a known history of COPD, chronic back pain, stroke, hypertension, degenerative disc disease and a multitude of other medical issues including GERD, HOH, obseity, sleep apnea (CPAP most nights), asthma, and some speech and thought processes determined  to be slower per chart note (suspect related to previous CVA).  Pt verbally conversive and follows commands., but also noted min slowness in verbal responses intermittently.  Type of Study: Bedside Swallow Evaluation Previous Swallow Assessment: none noted Diet Prior to this Study: Regular;Thin liquids Temperature Spikes Noted: No (wbc elevated) Respiratory Status: Room air History of Recent Intubation: No Behavior/Cognition: Alert;Cooperative (Pt follows commands) Oral Cavity Assessment: Within Functional Limits Oral Care Completed by SLP: Recent completion by staff Oral Cavity - Dentition: Dentures, top;Dentures, bottom Vision: Functional for self-feeding Self-Feeding Abilities: Able to feed self;Needs set up Patient Positioning: Upright in bed (Needed assist positioning upright) Baseline Vocal Quality: Normal Volitional Cough: Strong Volitional Swallow: Able to elicit    Oral/Motor/Sensory Function Overall Oral Motor/Sensory Function: Within functional limits   Ice Chips Ice chips: Not tested   Thin Liquid Thin Liquid: Within functional limits (tea 3 trials ) Presentation: [employer]  Nectar Thick Nectar Thick Liquid: Not tested   Honey Thick Honey Thick Liquid: Not tested   Puree Puree: Within functional limits Presentation: Self Fed;Spoon (4 trials)   Solid   GO   Solid: Within functional limits Presentation: Self Fed (moistened graham cracker w/ ice cream) Other Comments:  (4 trials)       Carolynn Sayers, SLP-Graduate Student Carolynn Sayers, 07/15/17 3:14PM    This patient note, response to treatment and overall treatment plan has been reviewed and this clinician agrees with the information provided.  Orinda Kenner, [place], [place] 07/15/17, 3:16 PM [phone]

## 2017-07-18 MED ORDER — CEFAZOLIN SODIUM 10 G IJ SOLR
3.0000 g | Freq: Once | INTRAMUSCULAR | Status: AC
  Administered 2017-07-23: 3 g via INTRAVENOUS
  Filled 2017-07-18 (×2): qty 3000

## 2017-07-21 ENCOUNTER — Ambulatory Visit (INDEPENDENT_AMBULATORY_CARE_PROVIDER_SITE_OTHER): Payer: Medicare Other | Admitting: Specialist

## 2017-07-22 ENCOUNTER — Encounter (INDEPENDENT_AMBULATORY_CARE_PROVIDER_SITE_OTHER): Payer: Self-pay | Admitting: Specialist

## 2017-07-22 ENCOUNTER — Ambulatory Visit (INDEPENDENT_AMBULATORY_CARE_PROVIDER_SITE_OTHER): Payer: Managed Care, Other (non HMO) | Admitting: Specialist

## 2017-07-22 ENCOUNTER — Ambulatory Visit (INDEPENDENT_AMBULATORY_CARE_PROVIDER_SITE_OTHER): Payer: Managed Care, Other (non HMO)

## 2017-07-22 VITALS — BP 115/65 | HR 75 | Ht 72.0 in | Wt 292.0 lb

## 2017-07-22 DIAGNOSIS — S32010A Wedge compression fracture of first lumbar vertebra, initial encounter for closed fracture: Secondary | ICD-10-CM

## 2017-07-22 DIAGNOSIS — M545 Low back pain: Secondary | ICD-10-CM | POA: Diagnosis not present

## 2017-07-22 DIAGNOSIS — G919 Hydrocephalus, unspecified: Secondary | ICD-10-CM

## 2017-07-22 DIAGNOSIS — M25511 Pain in right shoulder: Secondary | ICD-10-CM

## 2017-07-22 DIAGNOSIS — M4856XA Collapsed vertebra, not elsewhere classified, lumbar region, initial encounter for fracture: Secondary | ICD-10-CM

## 2017-07-22 NOTE — Patient Instructions (Signed)
Plan: Avoid overhead lifting and overhead use of the arms. Do not lift greater than 10 lbs. Tylenol ES one every 6-8 hours for pain and inflamation Have the L1 kyphoplasty See a neurosurgeon for evaluation for possible normal pressure hydrocephalus

## 2017-07-22 NOTE — Progress Notes (Addendum)
Office Visit Note   Patient: Eddie Carter           Date of Birth: [birth year]           MRN: [id number] Visit Date: 07/22/2017              Requested by: [name], MD [address] [place], [place] [address] PCP: [name], MD   Assessment & Plan: Visit Diagnoses:  1. Right shoulder pain, unspecified chronicity   2. Low back pain, unspecified back pain laterality, unspecified chronicity, with sciatica presence unspecified   3. Hydrocephalus, adult   4. Closed compression fracture of L1 lumbar vertebral body ([place])   76 year old male with a progressive loss of function both cognitive and motor with recent increasing loss of standing and walking Tolerance. Fallen multiple times in the past month, twice this past weekend. He was seen in the [facility] ER 8/21 and MRI of the lumbar spine demonstrated an L1 compression fracture, CT of the head demonstrating increased ventricular and sulci which in the face of ataxia would be concerning for normal pressure hydrocephalus. He has increasing difficulty with standing and walking. Bowel and bladder incontinence that is not explained by his lumbar MRI findings. He returns today for evaluation of the right shoulder cuff tear. The right shoulder exam is noticeably better with improved ROM And strength in lifting his right arm. Unfortunately he does have an area of compression deformity at L1 anteriorly. He is scheduled to have a kyphoplasty by Dr. Kyla Balzarine at [facility] tomorrow. I would recommend a neurosurgical referral for consideration of a V-P Shunt. Mr. Carroll indicates that he has been seen by a neurosurgeon in the past twice but does not remember the name of the specialist. I will talk to Dr. Lavera Guise tomorrow and see whom see has seen and obtain a referral. A note can be given to this patient to give to Dr.Mentz for considering Consultation referral while treating his L1 compression fracture tomorrow.   Plan: Avoid overhead lifting and  overhead use of the arms. Do not lift greater than 10 lbs. Tylenol ES one every 6-8 hours for pain and inflamation Have the L1 kyphoplasty See a neurosurgeon for evaluation for possible normal pressure hydrocephalus .  Follow-Up Instructions: Return in about 6 weeks (around 09/02/2017).   Orders:  Orders Placed This Encounter  Procedures  . XR Lumbar Spine 2-3 Views  . Ambulatory referral to Neurosurgery   No orders of the defined types were placed in this encounter.     Procedures: No procedures performed   Clinical Data: Findings:  MRI of the lumbar spine 07/14/2017 with compression deformity 10% L1 with edema changes consistent with L1 compression fracture. The disc at L3-4 with dessication and also at L5-S1 no significant nerve compresson, central canal is patent.  CT Brain 07/13/2017 Findings of prominent ventricles and sulci which in the face of ataxia may represent normal pressure hydrocephalus.    Subjective: Chief Complaint  Patient presents with  . Lower Back - Pain, Injury    He has had 2 falls over the weekend    76 year old male right hand dominant, has been followed with right complete RCT with PT. He is seeing improved function with therapy, now able to reach his mouth and perform improve use of the right arm overhead and improved lifting of the right arm. He had a fall this past weekend and is scheduled for kyphoplasty of the L1  Tomorrow AM.  Has bowel and bladder incontinence.  He uses depends.He is having increasing difficulty with standing and walking and incontinence.     Review of Systems  Constitutional: Negative.   HENT: Negative.   Eyes: Negative.   Respiratory: Negative.   Cardiovascular: Negative.   Gastrointestinal: Negative.   Endocrine: Negative.   Genitourinary: Negative.   Musculoskeletal: Negative.   Skin: Negative.   Allergic/Immunologic: Negative.   Neurological: Negative.   Hematological: Negative.   Psychiatric/Behavioral:  Negative.      Objective: Vital Signs: BP 115/65 (BP Location: Left Arm, Patient Position: Sitting)   Pulse 75   Ht 6' (1.829 m)   Wt 292 lb (132.5 kg)   BMI 39.60 kg/m   Physical Exam  Constitutional: He is oriented to person, place, and time. He appears well-developed and well-nourished.  HENT:  Head: Normocephalic and atraumatic.  Eyes: Pupils are equal, round, and reactive to light. EOM are normal.  Neck: Normal range of motion. Neck supple.  Pulmonary/Chest: Effort normal and breath sounds normal.  Abdominal: Soft. Bowel sounds are normal.  Neurological: He is alert and oriented to person, place, and time.  Skin: Skin is warm and dry.  Psychiatric: He has a normal mood and affect. His behavior is normal. Judgment and thought content normal.    Right Shoulder Exam   Tenderness  The patient is experiencing tenderness in the acromion.  Range of Motion  Active Abduction: abnormal  Passive Abduction: abnormal  Extension: normal  Forward Flexion: abnormal  External Rotation: abnormal  Internal Rotation 0 degrees: L2   Muscle Strength  Abduction: 4/5  Internal Rotation: 4/5  External Rotation: 4/5  Supraspinatus: 5/5  Subscapularis: 5/5  Biceps: 5/5   Tests  Impingement: negative      Specialty Comments:  No specialty comments available.  Imaging: No results found.   PMFS History: Patient Active Problem List   Diagnosis Date Noted  . Closed compression fracture of L1 lumbar vertebra ([place]) 07/15/2017  . Back pain 07/15/2017  . Aspiration into airway 07/15/2017  . Wheezing 07/15/2017  . Leukocytosis 07/15/2017  . Bilateral calf pain 07/15/2017  . Low back pain 07/13/2017  . Cephalalgia 01/29/2016  . Adiposity 01/29/2016  . Hypertriglyceridemia 01/29/2016  . H/O respiratory system disease 01/29/2016  . H/O erectile dysfunction 01/29/2016  . Cerebrovascular accident, old 01/29/2016  . Diabetes mellitus ([place]) 01/29/2016  . Chronic obstructive  pulmonary disease ([place]) 01/29/2016  . Benign fibroma of prostate 01/29/2016  . Chronic pain 01/29/2016  . 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 Abnormal MRI, lumbar spine 01/29/2016  . Chronic low back pain (Location of Primary Source of Pain) (Bilateral) (L>R) 01/29/2016  . Chronic lower extremity pain (Location of Secondary source of pain) (Bilateral) (L>R) 01/29/2016  . Long term current use of opiate analgesic 01/29/2016  . Long term prescription opiate use 01/29/2016  . Opiate use 01/29/2016  . Encounter for therapeutic drug level monitoring 01/29/2016  . Encounter for pain management planning 01/29/2016  . Chronic lumbar radicular pain (Location of Secondary source of pain) (Bilateral) (L4 Dermatome) (L>R) 01/29/2016  . Chronic hip pain (Location of Tertiary source of pain) (Bilateral) (L>R) 01/29/2016  . Chronic knee pain (Bilateral) (L>R) 01/29/2016  . Chronic anticoagulation (Plavix) 01/29/2016  . Erectile dysfunction of organic origin 01/22/2016  . BPH with obstruction/lower urinary tract symptoms 07/24/2015  . Hypogonadism in male 07/24/2015  . HNP (herniated nucleus pulposus), lumbar 11/05/2014    Class: Acute  . Herniated nucleus pulposus, lumbar 11/05/2014  .  Pulmonary nodules 10/25/2013  . ILD (interstitial lung disease) ([place]) 08/22/2013  . Traumatic tear of lateral meniscus of right knee   . Medial meniscus, posterior horn derangement   . COPD (chronic obstructive pulmonary disease) ([place])   . Shortness of breath   . Sleep apnea   . Stroke ([place])   . Arthritis   . GERD (gastroesophageal reflux disease)   . History of GI bleed   . HOH (hard of hearing)   . Full dentures   . Complication of anesthesia    Past Medical History:  Diagnosis Date  . Arthritis   . Asthma   . BPH (benign prostatic hyperplasia)   . 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 Complication of anesthesia    after septoplasty and uvulectomy-was in icu- [facility] - 10 yrs. ago  .  COPD (chronic obstructive pulmonary disease) ([place])   . Diabetes mellitus without complication ([place])   . ED (erectile dysfunction)   . Full dentures   . GERD (gastroesophageal reflux disease)    no meds  . Headache    difficulty with headaches- 1950's , again in 1967, 2008- again problems with migrraines   . History of GI bleed   . History of urethral stricture   . HOH (hard of hearing)   . Hypogonadism in male   . Joint pain   . Medial meniscus, posterior horn derangement    left knee  . Obesity   . Pneumonia 2014   [facility]  . Shortness of breath   . Skin cancer   . Sleep apnea    uses a cpap, most nights   . Stroke [facility]) 1995   some speech and thought processes slow  . Traumatic tear of lateral meniscus of right knee    right knee  . Urinary frequency   . Urinary incontinence     Family History  Problem Relation Age of Onset  . Cancer Mother   . Heart attack Father   . Heart disease Unknown   . Arthritis Unknown   . Prostate cancer Neg Hx   . Kidney disease Neg Hx     Past Surgical History:  Procedure Laterality Date  . APPENDECTOMY  1963  . CARPAL TUNNEL RELEASE     left  . COLONOSCOPY    . COLONOSCOPY WITH PROPOFOL N/A 12/07/2016   Procedure: COLONOSCOPY WITH PROPOFOL;  Surgeon: Lollie Sails, MD;  Location: [facility] ENDOSCOPY;  Service: Endoscopy;  Laterality: N/A;  . EYE SURGERY     foreign body removed fr. eye, ? side   . FOOT FOREIGN BODY REMOVAL  1976   left foot -multiple pieces glass  . HEMORROIDECTOMY  1995  . KNEE ARTHROSCOPY Bilateral 01/17/2013   Procedure: ARTHROSCOPY KNEE BILATERAL WITH MEDIAL AND LATERAL MENISECTOMIES;  Surgeon: Lorn Junes, MD;  Location: [place];  Service: Orthopedics;  Laterality: Bilateral;  . LUMBAR LAMINECTOMY N/A 11/05/2014   Procedure: LEFT L3-4 MICRODISCECTOMY;  Surgeon: Jessy Oto, MD;  Location: [place];  Service: Orthopedics;  Laterality: N/A;  . LUMBAR LAMINECTOMY/DECOMPRESSION MICRODISCECTOMY N/A  02/12/2015   Procedure: RE-DO LEFT L3-4 MICRODISCECTOMY;  Surgeon: Jessy Oto, MD;  Location: [place];  Service: Orthopedics;  Laterality: N/A;  . NASAL SEPTUM SURGERY  1995  . TONSILLECTOMY    . UPPER GI ENDOSCOPY    . UVULOPALATOPHARYNGOPLASTY (UPPP)/TONSILLECTOMY/SEPTOPLASTY  1995   same time with septoplasty-ended up ICU almost trached   Social History   Occupational History  .  register of deeds   Social History Main Topics  . Smoking status: Former Smoker    Packs/day: 1.50    Years: 40.00    Types: Cigarettes    Quit date: 01/14/1996  . Smokeless tobacco: Never Used  . Alcohol use Yes     Comment: wine in a month  . Drug use: No  . Sexual activity: Not on file

## 2017-07-23 ENCOUNTER — Ambulatory Visit: Payer: Managed Care, Other (non HMO)

## 2017-07-23 ENCOUNTER — Encounter: Payer: Self-pay | Admitting: *Deleted

## 2017-07-23 ENCOUNTER — Ambulatory Visit: Payer: Managed Care, Other (non HMO) | Admitting: Anesthesiology

## 2017-07-23 ENCOUNTER — Ambulatory Visit
Admission: RE | Admit: 2017-07-23 | Discharge: 2017-07-23 | Disposition: A | Discharge status: Home / Self Care | Payer: Managed Care, Other (non HMO) | Source: Ambulatory Visit | Attending: Orthopedic Surgery | Admitting: Orthopedic Surgery

## 2017-07-23 ENCOUNTER — Encounter
Admission: RE | Disposition: A | Discharge status: Home / Self Care | Payer: Self-pay | Source: Ambulatory Visit | Attending: Orthopedic Surgery

## 2017-07-23 DIAGNOSIS — Z85828 Personal history of other malignant neoplasm of skin: Secondary | ICD-10-CM | POA: Insufficient documentation

## 2017-07-23 DIAGNOSIS — Z6839 Body mass index (BMI) 39.0-39.9, adult: Secondary | ICD-10-CM | POA: Diagnosis not present

## 2017-07-23 DIAGNOSIS — E119 Type 2 diabetes mellitus without complications: Secondary | ICD-10-CM | POA: Diagnosis not present

## 2017-07-23 DIAGNOSIS — Z7901 Long term (current) use of anticoagulants: Secondary | ICD-10-CM | POA: Diagnosis not present

## 2017-07-23 DIAGNOSIS — Z79899 Other long term (current) drug therapy: Secondary | ICD-10-CM | POA: Insufficient documentation

## 2017-07-23 DIAGNOSIS — Z87891 Personal history of nicotine dependence: Secondary | ICD-10-CM | POA: Diagnosis not present

## 2017-07-23 DIAGNOSIS — G8929 Other chronic pain: Secondary | ICD-10-CM | POA: Insufficient documentation

## 2017-07-23 DIAGNOSIS — J449 Chronic obstructive pulmonary disease, unspecified: Secondary | ICD-10-CM | POA: Insufficient documentation

## 2017-07-23 DIAGNOSIS — J849 Interstitial pulmonary disease, unspecified: Secondary | ICD-10-CM | POA: Diagnosis not present

## 2017-07-23 DIAGNOSIS — Z8673 Personal history of transient ischemic attack (TIA), and cerebral infarction without residual deficits: Secondary | ICD-10-CM | POA: Diagnosis not present

## 2017-07-23 DIAGNOSIS — M4856XA Collapsed vertebra, not elsewhere classified, lumbar region, initial encounter for fracture: Secondary | ICD-10-CM | POA: Insufficient documentation

## 2017-07-23 DIAGNOSIS — K219 Gastro-esophageal reflux disease without esophagitis: Secondary | ICD-10-CM | POA: Diagnosis not present

## 2017-07-23 DIAGNOSIS — Z886 Allergy status to analgesic agent status: Secondary | ICD-10-CM | POA: Insufficient documentation

## 2017-07-23 DIAGNOSIS — E039 Hypothyroidism, unspecified: Secondary | ICD-10-CM | POA: Insufficient documentation

## 2017-07-23 DIAGNOSIS — M1990 Unspecified osteoarthritis, unspecified site: Secondary | ICD-10-CM | POA: Diagnosis not present

## 2017-07-23 DIAGNOSIS — N529 Male erectile dysfunction, unspecified: Secondary | ICD-10-CM | POA: Diagnosis not present

## 2017-07-23 DIAGNOSIS — Z7984 Long term (current) use of oral hypoglycemic drugs: Secondary | ICD-10-CM | POA: Diagnosis not present

## 2017-07-23 DIAGNOSIS — G4730 Sleep apnea, unspecified: Secondary | ICD-10-CM | POA: Diagnosis not present

## 2017-07-23 DIAGNOSIS — T148XXA Other injury of unspecified body region, initial encounter: Secondary | ICD-10-CM

## 2017-07-23 DIAGNOSIS — E669 Obesity, unspecified: Secondary | ICD-10-CM | POA: Insufficient documentation

## 2017-07-23 DIAGNOSIS — E781 Pure hyperglyceridemia: Secondary | ICD-10-CM | POA: Insufficient documentation

## 2017-07-23 DIAGNOSIS — E291 Testicular hypofunction: Secondary | ICD-10-CM | POA: Diagnosis not present

## 2017-07-23 HISTORY — PX: KYPHOPLASTY: SHX5884

## 2017-07-23 LAB — GLUCOSE, CAPILLARY
GLUCOSE-CAPILLARY: 94 mg/dL (ref 65–99)
Glucose-Capillary: 100 mg/dL — ABNORMAL HIGH (ref 65–99)

## 2017-07-23 SURGERY — KYPHOPLASTY
Anesthesia: General | Site: Back | Wound class: Clean

## 2017-07-23 MED ORDER — MIDAZOLAM HCL 2 MG/2ML IJ SOLN
INTRAMUSCULAR | Status: AC
  Filled 2017-07-23: qty 2

## 2017-07-23 MED ORDER — ONDANSETRON HCL 4 MG/2ML IJ SOLN
INTRAMUSCULAR | Status: AC
  Filled 2017-07-23: qty 2

## 2017-07-23 MED ORDER — FENTANYL CITRATE (PF) 100 MCG/2ML IJ SOLN
INTRAMUSCULAR | Status: DC | PRN
  Administered 2017-07-23 (×2): 25 ug via INTRAVENOUS
  Administered 2017-07-23: 50 ug via INTRAVENOUS

## 2017-07-23 MED ORDER — SODIUM CHLORIDE 0.9 % IV SOLN
INTRAVENOUS | Status: DC
  Administered 2017-07-23: 25 mL/h via INTRAVENOUS

## 2017-07-23 MED ORDER — LIDOCAINE HCL (PF) 1 % IJ SOLN
INTRAMUSCULAR | Status: AC
  Filled 2017-07-23: qty 30

## 2017-07-23 MED ORDER — FENTANYL CITRATE (PF) 100 MCG/2ML IJ SOLN
INTRAMUSCULAR | Status: AC
  Filled 2017-07-23: qty 2

## 2017-07-23 MED ORDER — FENTANYL CITRATE (PF) 100 MCG/2ML IJ SOLN: 25.0000 ug | INTRAMUSCULAR | Status: DC | PRN

## 2017-07-23 MED ORDER — CLOPIDOGREL BISULFATE 75 MG PO TABS: 75.0000 mg | ORAL_TABLET | Freq: Every day | ORAL | 11 refills | Status: AC

## 2017-07-23 MED ORDER — KETAMINE HCL 50 MG/ML IJ SOLN
INTRAMUSCULAR | Status: AC
  Filled 2017-07-23: qty 10

## 2017-07-23 MED ORDER — PROPOFOL 500 MG/50ML IV EMUL
INTRAVENOUS | Status: AC
  Filled 2017-07-23: qty 50

## 2017-07-23 MED ORDER — IOPAMIDOL (ISOVUE-M 200) INJECTION 41%
INTRAMUSCULAR | Status: AC
  Filled 2017-07-23: qty 20

## 2017-07-23 MED ORDER — ONDANSETRON HCL 4 MG/2ML IJ SOLN: 4.0000 mg | Freq: Once | INTRAMUSCULAR | Status: DC | PRN

## 2017-07-23 MED ORDER — GLYCOPYRROLATE 0.2 MG/ML IJ SOLN
INTRAMUSCULAR | Status: AC
  Filled 2017-07-23: qty 1

## 2017-07-23 MED ORDER — BUPIVACAINE-EPINEPHRINE (PF) 0.5% -1:200000 IJ SOLN
INTRAMUSCULAR | Status: DC | PRN
  Administered 2017-07-23: 30 mL

## 2017-07-23 MED ORDER — BUPIVACAINE-EPINEPHRINE (PF) 0.5% -1:200000 IJ SOLN
INTRAMUSCULAR | Status: AC
  Filled 2017-07-23: qty 30

## 2017-07-23 MED ORDER — LIDOCAINE-EPINEPHRINE 0.5 %-1:200000 IJ SOLN
INTRAMUSCULAR | Status: AC
  Filled 2017-07-23: qty 1

## 2017-07-23 MED ORDER — PROPOFOL 500 MG/50ML IV EMUL
INTRAVENOUS | Status: DC | PRN
  Administered 2017-07-23: 150 ug/kg/min via INTRAVENOUS

## 2017-07-23 MED ORDER — PROPOFOL 10 MG/ML IV BOLUS
INTRAVENOUS | Status: DC | PRN
  Administered 2017-07-23: 20 mg via INTRAVENOUS

## 2017-07-23 MED ORDER — LIDOCAINE HCL 1 % IJ SOLN
INTRAMUSCULAR | Status: DC | PRN
  Administered 2017-07-23: 20 mL

## 2017-07-23 MED ORDER — FAMOTIDINE 20 MG PO TABS
ORAL_TABLET | ORAL | Status: AC
  Administered 2017-07-23: 20 mg via ORAL
  Filled 2017-07-23: qty 1

## 2017-07-23 MED ORDER — FAMOTIDINE 20 MG PO TABS
20.0000 mg | ORAL_TABLET | Freq: Once | ORAL | Status: AC
  Administered 2017-07-23: 20 mg via ORAL

## 2017-07-23 SURGICAL SUPPLY — 16 items
CEMENT KYPHON CX01A KIT/MIXER (Cement) ×3 IMPLANT
DERMABOND ADVANCED (GAUZE/BANDAGES/DRESSINGS) ×2
DERMABOND ADVANCED .7 DNX12 (GAUZE/BANDAGES/DRESSINGS) ×1 IMPLANT
DEVICE BIOPSY BONE KYPHX (INSTRUMENTS) ×3 IMPLANT
DRAPE C-ARM XRAY 36X54 (DRAPES) ×3 IMPLANT
DURAPREP 26ML APPLICATOR (WOUND CARE) ×3 IMPLANT
GLOVE SURG SYN 9.0  PF PI (GLOVE) ×2
GLOVE SURG SYN 9.0 PF PI (GLOVE) ×1 IMPLANT
GOWN SRG 2XL LVL 4 RGLN SLV (GOWNS) ×1 IMPLANT
GOWN STRL NON-REIN 2XL LVL4 (GOWNS) ×2
GOWN STRL REUS W/ TWL LRG LVL3 (GOWN DISPOSABLE) ×1 IMPLANT
GOWN STRL REUS W/TWL LRG LVL3 (GOWN DISPOSABLE) ×2
PACK KYPHOPLASTY (MISCELLANEOUS) ×3 IMPLANT
STRAP SAFETY BODY (MISCELLANEOUS) ×3 IMPLANT
TRAY KYPHOPAK 15/3 EXPRESS 1ST (MISCELLANEOUS) ×3 IMPLANT
TRAY KYPHOPAK 20/3 EXPRESS 1ST (MISCELLANEOUS) ×3 IMPLANT

## 2017-07-23 NOTE — OR Nursing (Signed)
Called [employer] report given .

## 2017-07-23 NOTE — Anesthesia Post-op Follow-up Note (Signed)
Anesthesia QCDR form completed.        

## 2017-07-23 NOTE — Discharge Instructions (Addendum)
Resume plavix tomorrow 9/1 Remove band aid on Sunday then ok to shower  [place]   1) The drugs that you were given will stay in your system until tomorrow so for the next 24 hours you should not:  A) Drive an automobile B) Make any legal decisions C) Drink any alcoholic beverage   2) You may resume regular meals tomorrow.  Today it is better to start with liquids and gradually work up to solid foods.  You may eat anything you prefer, but it is better to start with liquids, then soup and crackers, and gradually work up to solid foods.   3) Please notify your doctor immediately if you have any unusual bleeding, trouble breathing, redness and pain at the surgery site, drainage, fever, or pain not relieved by medication.    4) Additional Instructions: TAKE A STOOL SOFTENER TWICE A DAY WHILE TAKING NARCOTIC PAIN MEDICINE TO PREVENT CONSTIPATION   Please contact your physician with any problems or Same Day Surgery at [phone], Monday through Friday 6 am to 4 pm, or [place] at [facility] number at [phone].

## 2017-07-23 NOTE — Anesthesia Procedure Notes (Signed)
Date/Time: 07/23/2017 12:48 PM Performed by: Darlyne Russian Pre-anesthesia Checklist: Patient identified, Emergency Drugs available, Suction available, Patient being monitored and Timeout performed Patient Re-evaluated:Patient Re-evaluated prior to induction Oxygen Delivery Method: Nasal cannula Placement Confirmation: positive ETCO2

## 2017-07-23 NOTE — Anesthesia Preprocedure Evaluation (Signed)
Anesthesia Evaluation  Patient identified by MRN, date of birth, ID band Patient awake    Reviewed: Allergy & Precautions, NPO status , Patient's Chart, lab work & pertinent test results  History of Anesthesia Complications Negative for: history of anesthetic complications  Airway Mallampati: III       Dental   Pulmonary asthma , sleep apnea and Continuous Positive Airway Pressure Ventilation , COPD,  COPD inhaler, former smoker,           Cardiovascular (-) hypertension(-) Past MI and (-) CHF (-) dysrhythmias (-) Valvular Problems/Murmurs     Neuro/Psych CVA (speech and mental processing affected), Residual Symptoms    GI/Hepatic Neg liver ROS, GERD  Medicated and Controlled,  Endo/Other  diabetes, Type 2, Oral Hypoglycemic AgentsHypothyroidism   Renal/GU negative Renal ROS     Musculoskeletal   Abdominal   Peds  Hematology negative hematology ROS (+)   Anesthesia Other Findings   Reproductive/Obstetrics                            Anesthesia Physical Anesthesia Plan  ASA: III  Anesthesia Plan: General   Post-op Pain Management:    Induction:   PONV Risk Score and Plan: 2 and Ondansetron and Dexamethasone  Airway Management Planned: Simple Face Mask  Additional Equipment:   Intra-op Plan:   Post-operative Plan:   Informed Consent: I have reviewed the patients History and Physical, chart, labs and discussed the procedure including the risks, benefits and alternatives for the proposed anesthesia with the patient or authorized representative who has indicated his/her understanding and acceptance.     Plan Discussed with:   Anesthesia Plan Comments:         Anesthesia Quick Evaluation

## 2017-07-23 NOTE — Op Note (Signed)
07/23/2017  1:47 PM  PATIENT:  Eddie Carter  76 y.o. male  PRE-OPERATIVE DIAGNOSIS:  COMPRESSION FRACTURE OF L1 VERTEBRA  POST-OPERATIVE DIAGNOSIS:  COMPRESSION FRACTURE OF L1 VERTEBRA  PROCEDURE:  Procedure(s): KYPHOPLASTY- L1 (N/A)  SURGEON: Laurene Footman, MD  ASSISTANTS: None  ANESTHESIA:   local and MAC  EBL:  Total I/O In: 500 [I.V.:500] Out: 0   BLOOD ADMINISTERED:none  DRAINS: none   LOCAL MEDICATIONS USED:  MARCAINE    and XYLOCAINE   SPECIMEN:  Source of Specimen:  L1 vertebral body  DISPOSITION OF SPECIMEN:  PATHOLOGY  COUNTS:  YES  TOURNIQUET:  * No tourniquets in log *  IMPLANTS: bone cement  DICTATION: .Dragon Dictation   patient brought the operating room and after adequate sedation was given the patient was placed prone and C-arm brought in and with goodvisualization of T9. After patient identification and timeout procedure completed forcc 1% Xylocaine was infiltrated on the right side at L1 because of better visualization of the pedicle on that side.Marland KitchenNext the back was prepped and draped in sterile manner and repeat timeout procedure carried out. Spinal needle was used to get local anesthetic down to the pedicle on the right side with a total of 15 cc half percent Sensorcaine with epinephrine and 15 cc Xylocaine a small incision was then made and a trocar advanced and X pedicular fashion into the L1 body staying lateral to the medial wall the pedicle set was advanced through the pedicle with biplanar imaging used biopsy was then obtained balloon was inflated to approximately 4.5 cc .The cement was then mixed and inserted when it was the appropriate consistency with 6cc of bone cement filling the L1 vertebral body getting very good interdigitation and coverage from superior to inferior medial right to left sidesl without extravasation.When the cement was set the trochar removed and permanent C-arm views obtained. Dermabond were used to close the skin  followed by a Band-Aid PLAN OF CARE: Discharge to home after PACU  PATIENT DISPOSITION:  PACU - hemodynamically stable.

## 2017-07-23 NOTE — H&P (Signed)
Subjective:   Patient is a 76 y.o. male presents with Back pain. Onset of symptoms was abrupt starting 10 days ago with gradually worsening course since that time. The pain is located mid back. Patient describes the pain as sharp at times Pain is worse with activity or sitting up and rated as moderate and severe. Pain has been associated with apparent fall with prior x-ray and MRI showing L1 compression fracture. Patient denies numbness or tingling in the legs. Symptoms are aggravated by standing or attempting to walk. Symptoms improve with laying still. Past history includes heart problems and has been off his Plavix for the procedure.  Previous studies include MRI showing L1 compression fracture.  Patient Active Problem List   Diagnosis Date Noted  . Closed compression fracture of L1 lumbar vertebra ([place]) 07/15/2017  . Back pain 07/15/2017  . Aspiration into airway 07/15/2017  . Wheezing 07/15/2017  . Leukocytosis 07/15/2017  . Bilateral calf pain 07/15/2017  . Low back pain 07/13/2017  . Cephalalgia 01/29/2016  . Adiposity 01/29/2016  . Hypertriglyceridemia 01/29/2016  . H/O respiratory system disease 01/29/2016  . H/O erectile dysfunction 01/29/2016  . Cerebrovascular accident, old 01/29/2016  . Diabetes mellitus ([place]) 01/29/2016  . Chronic obstructive pulmonary disease ([place]) 01/29/2016  . Benign fibroma of prostate 01/29/2016  . Chronic pain 01/29/2016  . 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 Abnormal MRI, lumbar spine 01/29/2016  . Chronic low back pain (Location of Primary Source of Pain) (Bilateral) (L>R) 01/29/2016  . Chronic lower extremity pain (Location of Secondary source of pain) (Bilateral) (L>R) 01/29/2016  . Long term current use of opiate analgesic 01/29/2016  . Long term prescription opiate use 01/29/2016  . Opiate use 01/29/2016  . Encounter for therapeutic drug level monitoring 01/29/2016  . Encounter for pain management planning 01/29/2016  . Chronic lumbar  radicular pain (Location of Secondary source of pain) (Bilateral) (L4 Dermatome) (L>R) 01/29/2016  . Chronic hip pain (Location of Tertiary source of pain) (Bilateral) (L>R) 01/29/2016  . Chronic knee pain (Bilateral) (L>R) 01/29/2016  . Chronic anticoagulation (Plavix) 01/29/2016  . Erectile dysfunction of organic origin 01/22/2016  . BPH with obstruction/lower urinary tract symptoms 07/24/2015  . Hypogonadism in male 07/24/2015  . HNP (herniated nucleus pulposus), lumbar 11/05/2014    Class: Acute  . Herniated nucleus pulposus, lumbar 11/05/2014  . Pulmonary nodules 10/25/2013  . ILD (interstitial lung disease) ([place]) 08/22/2013  . Traumatic tear of lateral meniscus of right knee   . Medial meniscus, posterior horn derangement   . COPD (chronic obstructive pulmonary disease) ([place])   . Shortness of breath   . Sleep apnea   . Stroke ([place])   . Arthritis   . GERD (gastroesophageal reflux disease)   . History of GI bleed   . HOH (hard of hearing)   . Full dentures   . Complication of anesthesia    Past Medical History:  Diagnosis Date  . Arthritis   . Asthma   . BPH (benign prostatic hyperplasia)   . 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 Complication of anesthesia    after septoplasty and uvulectomy-was in icu- [facility] - 10 yrs. ago  . COPD (chronic obstructive pulmonary disease) ([place])   . Diabetes mellitus without complication ([place])   . ED (erectile dysfunction)   . Full dentures   . GERD (gastroesophageal reflux disease)    no meds  . Headache    difficulty with headaches- 1950's , again in 1967, 2008-  again problems with migrraines   . History of GI bleed   . History of urethral stricture   . HOH (hard of hearing)   . Hypogonadism in male   . Joint pain   . Medial meniscus, posterior horn derangement    left knee  . Obesity   . Pneumonia 2014   [facility]  . Shortness of breath   . Skin cancer   . Sleep apnea    uses a cpap, most nights   . Stroke [facility]) 1995    some speech and thought processes slow  . Traumatic tear of lateral meniscus of right knee    right knee  . Urinary frequency   . Urinary incontinence     Past Surgical History:  Procedure Laterality Date  . APPENDECTOMY  1963  . CARPAL TUNNEL RELEASE     left  . COLONOSCOPY    . COLONOSCOPY WITH PROPOFOL N/A 12/07/2016   Procedure: COLONOSCOPY WITH PROPOFOL;  Surgeon: Lollie Sails, MD;  Location: [facility] ENDOSCOPY;  Service: Endoscopy;  Laterality: N/A;  . EYE SURGERY     foreign body removed fr. eye, ? side   . FOOT FOREIGN BODY REMOVAL  1976   left foot -multiple pieces glass  . HEMORROIDECTOMY  1995  . KNEE ARTHROSCOPY Bilateral 01/17/2013   Procedure: ARTHROSCOPY KNEE BILATERAL WITH MEDIAL AND LATERAL MENISECTOMIES;  Surgeon: Lorn Junes, MD;  Location: [place];  Service: Orthopedics;  Laterality: Bilateral;  . LUMBAR LAMINECTOMY N/A 11/05/2014   Procedure: LEFT L3-4 MICRODISCECTOMY;  Surgeon: Jessy Oto, MD;  Location: [place];  Service: Orthopedics;  Laterality: N/A;  . LUMBAR LAMINECTOMY/DECOMPRESSION MICRODISCECTOMY N/A 02/12/2015   Procedure: RE-DO LEFT L3-4 MICRODISCECTOMY;  Surgeon: Jessy Oto, MD;  Location: [place];  Service: Orthopedics;  Laterality: N/A;  . NASAL SEPTUM SURGERY  1995  . TONSILLECTOMY    . UPPER GI ENDOSCOPY    . UVULOPALATOPHARYNGOPLASTY (UPPP)/TONSILLECTOMY/SEPTOPLASTY  1995   same time with septoplasty-ended up ICU almost trached    Prescriptions Prior to Admission  Medication Sig Dispense Refill Last Dose  . acetaminophen (TYLENOL) 500 MG tablet Take 500 mg by mouth every 8 (eight) hours as needed.    07/23/2017 at 0730  . albuterol (PROVENTIL HFA;VENTOLIN HFA) 108 (90 Base) MCG/ACT inhaler Inhale into the lungs.   07/23/2017 at 0730  . budesonide-formoterol (SYMBICORT) 160-4.5 MCG/ACT inhaler Inhale 2 puffs into the lungs 2 (two) times daily. 3 Inhaler 1 07/23/2017 at 0730  . Cholecalciferol (VITAMIN D) 1000 UNITS capsule  Take 3,000 Units by mouth daily.   07/22/2017 at 0900  . CINNAMON PO Take 4,000 mg by mouth daily at 2 PM.   07/22/2017 at 1400  . Coenzyme Q10 (CO Q-10) 100 MG CAPS Take 1 tablet by mouth at bedtime.   07/22/2017 at 2200  . docusate sodium (COLACE) 100 MG capsule Take 1 capsule (100 mg total) by mouth 2 (two) times daily. 10 capsule 0 07/22/2017 at 1800  . dutasteride (AVODART) 0.5 MG capsule Take 1 capsule by mouth daily.   07/22/2017 at 0900  . fluticasone (FLONASE) 50 MCG/ACT nasal spray Place 2 sprays into both nostrils daily. 16 g 5 07/22/2017 at 0900  . hydrOXYzine (ATARAX/VISTARIL) 10 MG tablet Take 2 tablets by mouth at bedtime.   07/22/2017 at 2000  . levothyroxine (SYNTHROID, LEVOTHROID) 50 MCG tablet Take 50 mcg by mouth daily. Take on an empty stomach with a glass of water at least 30-60 minutes before breakfast.  07/22/2017 at 0730  . Multiple Vitamin (MULTIVITAMIN) capsule Take 1 capsule by mouth daily.   07/22/2017 at 0900  . polyethylene glycol (MIRALAX / GLYCOLAX) packet Take 17 g by mouth daily as needed. Reported on 01/21/2016   07/22/2017 at 1700  . RA KRILL OIL 500 MG CAPS Take by mouth.   07/22/2017 at 0900  . senna (SENOKOT) 8.6 MG TABS tablet Take 1 tablet (8.6 mg total) by mouth 2 (two) times daily. 120 each 0 07/22/2017 at 0900  . solifenacin (VESICARE) 10 MG tablet Take 1 tablet by mouth daily.   07/22/2017 at 0900  . tamsulosin (FLOMAX) 0.4 MG CAPS capsule Take 1 capsule by mouth daily.   07/22/2017 at 0900  . tiotropium (SPIRIVA) 18 MCG inhalation capsule Place 1 capsule (18 mcg total) into inhaler and inhale daily. 90 capsule 1 07/23/2017 at 0730  . topiramate (TOPAMAX) 200 MG tablet Take 1 tablet by mouth daily.   07/22/2017 at 0900  . traMADol (ULTRAM) 50 MG tablet Take 1 tablet (50 mg total) by mouth every 6 (six) hours as needed for moderate pain. 40 tablet 0 07/23/2017 at 0800  . Iodoquinol-HC-Aloe Polysacch (ALCORTIN A) 1-2-1 % GEL Apply topically.   07/09/2017  .  oxyCODONE-acetaminophen (PERCOCET) 7.5-325 MG tablet Take 1 tablet by mouth every 8 (eight) hours as needed for severe pain. 20 tablet 0 Taking   Allergies  Allergen Reactions  . Asa [Aspirin] Shortness Of Breath  . Nsaids Other (See Comments)    Makes him bleed.    Social History  Substance Use Topics  . Smoking status: Former Smoker    Packs/day: 1.50    Years: 40.00    Types: Cigarettes    Quit date: 01/14/1996  . Smokeless tobacco: Never Used  . Alcohol use Yes     Comment: wine in a month    Family History  Problem Relation Age of Onset  . Cancer Mother   . Heart attack Father   . Heart disease Unknown   . Arthritis Unknown   . Prostate cancer Neg Hx   . Kidney disease Neg Hx     Review of Systems Pertinent items are noted in HPI.  Objective:   Patient Vitals for the past 8 hrs:  BP Temp Temp src Pulse Resp SpO2 Height Weight  07/23/17 1151 133/60 (!) 96.6 F (35.9 C) Tympanic 69 18 98 % 6' (1.829 m) 132.5 kg (292 lb)   No intake/output data recorded. No intake/output data recorded.    BP 133/60   Pulse 69   Temp (!) 96.6 F (35.9 C) (Tympanic)   Resp 18   Ht 6' (1.829 m)   Wt 132.5 kg (292 lb)   SpO2 98%   BMI 39.60 kg/m  General appearance: alert, mild distress and slowed mentation Lungs: clear to auscultation bilaterally Heart: regular rate and rhythm, S1, S2 normal, no murmur, click, rub or gallop   Data ReviewRadiology review: MRI showing L1 compression fracture during his prior hospitalization  Assessment:   Active Problems:   * No active hospital problems. * L1 compression fracture  Plan:   L1 kyphoplasty

## 2017-07-23 NOTE — Transfer of Care (Signed)
Immediate Anesthesia Transfer of Care Note  Patient: Eddie Carter  Procedure(s) Performed: Procedure(s): KYPHOPLASTY- L1 (N/A)  Patient Location: PACU  Anesthesia Type:General  Level of Consciousness: awake, alert , oriented and patient cooperative  Airway & Oxygen Therapy: Patient Spontanous Breathing and Patient connected to nasal cannula oxygen  Post-op Assessment: Report given to RN and Post -op Vital signs reviewed and stable  Post vital signs: Reviewed and stable  Last Vitals:  Vitals:   07/23/17 1151 07/23/17 1342  BP: 133/60 117/60  Pulse: 69 78  Resp: 18 15  Temp: (!) 35.9 C 36.6 C  SpO2: 98% 100%    Last Pain:  Vitals:   07/23/17 1342  TempSrc: Temporal  PainSc: 0-No pain      Patients Stated Pain Goal: 1 (70/34/03 5248)  Complications: No apparent anesthesia complications

## 2017-07-23 NOTE — Anesthesia Postprocedure Evaluation (Signed)
Anesthesia Post Note  Patient: Eddie Carter  Procedure(s) Performed: Procedure(s) (LRB): KYPHOPLASTY- L1 (N/A)  Patient location during evaluation: PACU Anesthesia Type: General Level of consciousness: awake and alert Pain management: pain level controlled Vital Signs Assessment: post-procedure vital signs reviewed and stable Respiratory status: spontaneous breathing and respiratory function stable Cardiovascular status: stable Anesthetic complications: no     Last Vitals:  Vitals:   07/23/17 1342 07/23/17 1357  BP: 117/60 115/62  Pulse: 78 73  Resp: 15 13  Temp: 36.6 C   SpO2: 100% 100%    Last Pain:  Vitals:   07/23/17 1342  TempSrc: Temporal  PainSc: 3                  [name]

## 2017-07-27 LAB — SURGICAL PATHOLOGY

## 2017-08-04 DIAGNOSIS — F0390 Unspecified dementia without behavioral disturbance: Secondary | ICD-10-CM | POA: Insufficient documentation

## 2017-08-04 DIAGNOSIS — Z8669 Personal history of other diseases of the nervous system and sense organs: Secondary | ICD-10-CM | POA: Insufficient documentation

## 2017-08-04 DIAGNOSIS — Z87898 Personal history of other specified conditions: Secondary | ICD-10-CM | POA: Insufficient documentation

## 2017-08-05 ENCOUNTER — Ambulatory Visit (INDEPENDENT_AMBULATORY_CARE_PROVIDER_SITE_OTHER): Payer: Managed Care, Other (non HMO) | Admitting: Specialist

## 2017-08-09 DIAGNOSIS — M25552 Pain in left hip: Secondary | ICD-10-CM | POA: Insufficient documentation

## 2017-08-09 DIAGNOSIS — M79604 Pain in right leg: Secondary | ICD-10-CM | POA: Insufficient documentation

## 2017-08-10 DIAGNOSIS — E662 Morbid (severe) obesity with alveolar hypoventilation: Secondary | ICD-10-CM | POA: Insufficient documentation

## 2017-08-18 NOTE — Progress Notes (Signed)
4:42 PM   Eddie Carter Oct 06, 1941 [id number]  Referring provider: [name], MD [place], [place] [id number]  Chief Complaint  Patient presents with  . Benign Prostatic Hypertrophy    6 month follow up  . Hypogonadism    HPI: Patient is a 76 year old Caucasian male with testosterone deficiency, erectile dysfunction and BPH with LUTS who presents today for 6 month follow-up.    Testosterone deficiency Patient is no longer on testosterone therapy  BPH with LUTS His IPSS score today is 9, which is moderate lower urinary tract symptomatology. He is mostly dissatisfied with his quality life due to his urinary symptoms.   His previous I PSS score was 11/3.  His previous PVR was 15 mL.  His major complaint today is incontinence.  He has had these symptoms for over 10 years.  He denies any dysuria, hematuria or suprapubic pain.   He currently taking tamsulosin, dutasteride and Vesicare.  He feels that he is not getting his medications on a regular basis.  He would like to re evaluate his situation once he is back in his home.  His has had an urethral stricture that had been dilated in the remote past.  He also denies any recent fevers, chills, nausea or vomiting.  He does not have a family history of PCa.     IPSS    Row Name 08/19/17 1600         International Prostate Symptom Score   How often have you had the sensation of not emptying your bladder? Less than 1 in 5     How often have you had to urinate less than every two hours? Less than 1 in 5 times     How often have you found you stopped and started again several times when you urinated? Not at All     How often have you found it difficult to postpone urination? More than half the time     How often have you had a weak urinary stream? Less than half the time     How often have you had to strain to start urination? Not at All     How many times did you typically get up at night to urinate? 1 Time     Total  IPSS Score 9       Quality of Life due to urinary symptoms   If you were to spend the rest of your life with your urinary condition just the way it is now how would you feel about that? Mostly Disatisfied        Score:  1-7 Mild 8-19 Moderate 20-35 Severe   Erectile dysfunction He and his wife are not sexually active at this time.    PMH: Past Medical History:  Diagnosis Date  . Arthritis   . Asthma   . BPH (benign prostatic hyperplasia)   . 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 Complication of anesthesia    after septoplasty and uvulectomy-was in icu- [facility] - 10 yrs. ago  . COPD (chronic obstructive pulmonary disease) ([place])   . Diabetes mellitus without complication ([place])   . ED (erectile dysfunction)   . Full dentures   . GERD (gastroesophageal reflux disease)    no meds  . Headache    difficulty with headaches- 1950's , again in 1967, 2008- again problems with migrraines   . History of GI bleed   . History of urethral stricture   .  HOH (hard of hearing)   . Hypogonadism in male   . Joint pain   . Medial meniscus, posterior horn derangement    left knee  . Obesity   . Pneumonia 2014   [facility]  . Shortness of breath   . Skin cancer   . Sleep apnea    uses a cpap, most nights   . Stroke [facility]) 1995   some speech and thought processes slow  . Traumatic tear of lateral meniscus of right knee    right knee  . Urinary frequency   . Urinary incontinence     Surgical History: Past Surgical History:  Procedure Laterality Date  . APPENDECTOMY  1963  . CARPAL TUNNEL RELEASE     left  . COLONOSCOPY    . COLONOSCOPY WITH PROPOFOL N/A 12/07/2016   Procedure: COLONOSCOPY WITH PROPOFOL;  Surgeon: Lollie Sails, MD;  Location: [facility] ENDOSCOPY;  Service: Endoscopy;  Laterality: N/A;  . EYE SURGERY     foreign body removed fr. eye, ? side   . FOOT FOREIGN BODY REMOVAL  1976   left foot -multiple pieces glass  . HEMORROIDECTOMY  1995  . KNEE ARTHROSCOPY  Bilateral 01/17/2013   Procedure: ARTHROSCOPY KNEE BILATERAL WITH MEDIAL AND LATERAL MENISECTOMIES;  Surgeon: Lorn Junes, MD;  Location: [place];  Service: Orthopedics;  Laterality: Bilateral;  . KYPHOPLASTY N/A 07/23/2017   Procedure: KYPHOPLASTY- L1;  Surgeon: Hessie Knows, MD;  Location: [facility];  Service: Orthopedics;  Laterality: N/A;  . LUMBAR LAMINECTOMY N/A 11/05/2014   Procedure: LEFT L3-4 MICRODISCECTOMY;  Surgeon: Jessy Oto, MD;  Location: [place];  Service: Orthopedics;  Laterality: N/A;  . LUMBAR LAMINECTOMY/DECOMPRESSION MICRODISCECTOMY N/A 02/12/2015   Procedure: RE-DO LEFT L3-4 MICRODISCECTOMY;  Surgeon: Jessy Oto, MD;  Location: [place];  Service: Orthopedics;  Laterality: N/A;  . NASAL SEPTUM SURGERY  1995  . TONSILLECTOMY    . UPPER GI ENDOSCOPY    . UVULOPALATOPHARYNGOPLASTY (UPPP)/TONSILLECTOMY/SEPTOPLASTY  1995   same time with septoplasty-ended up ICU almost trached    Home Medications:  Allergies as of 08/19/2017      Reactions   Asa [aspirin] Shortness Of Breath   Nsaids Other (See Comments)   Makes him bleed.      Medication List       Accurate as of 08/19/17  4:42 PM. Always use your most recent med list.          acetaminophen 500 MG tablet Commonly known as:  TYLENOL Take 500 mg by mouth every 8 (eight) hours as needed.   AFLURIA QUADRIVALENT injection Generic drug:  influenza vac split quadrivalent Afluria Quad 2017-2018 60 mcg/0.5 mL intramuscular suspension   albuterol 108 (90 Base) MCG/ACT inhaler Commonly known as:  PROVENTIL HFA;VENTOLIN HFA Inhale into the lungs.   ALCORTIN A 1-2-1 % Gel Apply topically.   ANDROGEL PUMP 20.25 MG/ACT (1.62%) Gel Generic drug:  Testosterone AndroGel 20.25 mg/1.25 gram (1.62 %) transdermal gel pump   budesonide-formoterol 160-4.5 MCG/ACT inhaler Commonly known as:  SYMBICORT Inhale 2 puffs into the lungs 2 (two) times daily.   buPROPion 150 MG 24 hr tablet Commonly known  as:  WELLBUTRIN XL bupropion HCl XL 150 mg 24 hr tablet, extended release   CINNAMON PO Take 4,000 mg by mouth daily at 2 PM.   ciprofloxacin 500 MG tablet Commonly known as:  CIPRO ciprofloxacin 500 mg tablet   clopidogrel 75 MG tablet Commonly known as:  PLAVIX Take 1 tablet (75  mg total) by mouth daily.   Co Q-10 100 MG Caps Take 1 tablet by mouth at bedtime.   docusate sodium 100 MG capsule Commonly known as:  COLACE Take 1 capsule (100 mg total) by mouth 2 (two) times daily.   dutasteride 0.5 MG capsule Commonly known as:  AVODART Take 1 capsule by mouth daily.   fluticasone 50 MCG/ACT nasal spray Commonly known as:  FLONASE Place 2 sprays into both nostrils daily.   furosemide 20 MG tablet Commonly known as:  LASIX furosemide 20 mg tablet   hydrOXYzine 10 MG tablet Commonly known as:  ATARAX/VISTARIL Take 2 tablets by mouth at bedtime.   levofloxacin 750 MG tablet Commonly known as:  LEVAQUIN levofloxacin 750 mg tablet   levothyroxine 50 MCG tablet Commonly known as:  SYNTHROID, LEVOTHROID Take 50 mcg by mouth daily. Take on an empty stomach with a glass of water at least 30-60 minutes before breakfast.   LINZESS 145 MCG Caps capsule Generic drug:  linaclotide Linzess 145 mcg capsule   LYRICA 50 MG capsule Generic drug:  pregabalin Lyrica 50 mg capsule   Magnesium Citrate 100 MG Tabs Take by mouth.   methylPREDNISolone 4 MG Tbpk tablet Commonly known as:  MEDROL DOSEPAK methylprednisolone 4 mg tablets in a dose pack   montelukast 10 MG tablet Commonly known as:  SINGULAIR montelukast 10 mg tablet   multivitamin capsule Take 1 capsule by mouth daily.   MULTI-VITAMINS Tabs Take by mouth.   oxyCODONE-acetaminophen 7.5-325 MG tablet Commonly known as:  PERCOCET Take 1 tablet by mouth every 8 (eight) hours as needed for severe pain.   polyethylene glycol packet Commonly known as:  MIRALAX / GLYCOLAX Take 17 g by mouth daily as needed.  Reported on 01/21/2016   RA KRILL OIL 500 MG Caps Take by mouth.   senna 8.6 MG Tabs tablet Commonly known as:  SENOKOT Take 1 tablet (8.6 mg total) by mouth 2 (two) times daily.   tamsulosin 0.4 MG Caps capsule Commonly known as:  FLOMAX Take 1 capsule by mouth daily.   tiotropium 18 MCG inhalation capsule Commonly known as:  SPIRIVA Place 1 capsule (18 mcg total) into inhaler and inhale daily.   topiramate 200 MG tablet Commonly known as:  TOPAMAX Take 1 tablet by mouth daily.   traMADol 50 MG tablet Commonly known as:  ULTRAM Take 1 tablet (50 mg total) by mouth every 6 (six) hours as needed for moderate pain.   VESICARE 10 MG tablet Generic drug:  solifenacin Take 1 tablet by mouth daily.   Vitamin D 1000 units capsule Take 3,000 Units by mouth daily.       Allergies:  Allergies  Allergen Reactions  . Asa [Aspirin] Shortness Of Breath  . Nsaids Other (See Comments)    Makes him bleed.    Family History: Family History  Problem Relation Age of Onset  . Cancer Mother   . Heart attack Father   . Heart disease Unknown   . Arthritis Unknown   . Prostate cancer Neg Hx   . Kidney disease Neg Hx     Social History:  reports that he quit smoking about 21 years ago. His smoking use included Cigarettes. He has a 60.00 pack-year smoking history. He has never used smokeless tobacco. He reports that he does not drink alcohol or use drugs.  ROS: UROLOGY Frequent Urination?: Yes Hard to postpone urination?: Yes Burning/pain with urination?: No Get up at night to urinate?: No Leakage of  urine?: No Urine stream starts and stops?: No Trouble starting stream?: No Do you have to strain to urinate?: No Blood in urine?: No Urinary tract infection?: No Sexually transmitted disease?: No Injury to kidneys or bladder?: No Painful intercourse?: No Weak stream?: No Erection problems?: Yes Penile pain?: No  Gastrointestinal Nausea?: No Vomiting?:  No Indigestion/heartburn?: No Diarrhea?: Yes Constipation?: No  Constitutional Fever: No Night sweats?: No Weight loss?: No Fatigue?: Yes  Skin Skin rash/lesions?: Yes Itching?: Yes  Eyes Blurred vision?: No Double vision?: No  Ears/Nose/Throat Sore throat?: Yes Sinus problems?: No  Hematologic/Lymphatic Swollen glands?: No Easy bruising?: No  Cardiovascular Leg swelling?: Yes Chest pain?: No  Respiratory Cough?: Yes Shortness of breath?: Yes  Endocrine Excessive thirst?: No  Musculoskeletal Back pain?: Yes Joint pain?: Yes  Neurological Headaches?: No Dizziness?: Yes  Psychologic Depression?: No Anxiety?: No  Physical Exam: BP 115/73   Pulse 83   Ht 6' (1.829 m)   Wt 276 lb (125.2 kg)   BMI 37.43 kg/m   GU: Patient with uncircumcised phallus. Foreskin easily retracted  Urethral meatus is patent.  No penile discharge. No penile lesions or rashes. Scrotum without lesions, cysts, rashes and/or edema.  Testicles are located scrotally bilaterally. No masses are appreciated in the testicles. Left and right epididymis are normal. Rectal: Patient with  normal sphincter tone. Perineum without scarring or rashes. No rectal masses are appreciated. Prostate is approximately 35 grams, no nodules are appreciated. Seminal vesicles are normal.   Laboratory Data: Lab Results  Component Value Date   WBC 12.0 (H) 07/15/2017   HGB 13.8 07/15/2017   HCT 40.6 07/15/2017   MCV 91.9 07/15/2017   PLT 163 07/15/2017    Lab Results  Component Value Date   CREATININE 0.87 07/13/2017    PSA history:  Less than 0.1 ng/mL on 04/04/2014  Less than 0.1 ng/mL on 10/04/2014  0.3 ng/mL on 01/28/2015  0.1 ng/mL on 01/16/2016   Lab Results  Component Value Date   HGBA1C 5.7 (H) 05/31/2017   I have reviewed the labs   Assessment & Plan:    1. BPH with LUTS  - IPSS score is 9/4, it is improving  - Continue conservative management, avoiding bladder irritants and  timed voiding's  - most bothersome symptoms is/are incontinence  - Continue tamsulosin 0.4 mg daily and dutasteride 0.5 mg daily; refills given  - RTC in 12 months for IPSS, PVR and exam   2. Urinary incontinence  - PVR is 15 mL  - continue Vesicare 10 mg daily  - discussed UDS very briefly - patient will schedule another appointment to allow more time for discussion  - RTC in one year for I PSS and PVR   Return for patient will call to make return appointment.  Zara Council, [facility] [address], [place], [place] [address] [phone]

## 2017-08-19 ENCOUNTER — Ambulatory Visit (INDEPENDENT_AMBULATORY_CARE_PROVIDER_SITE_OTHER): Payer: Managed Care, Other (non HMO) | Admitting: Urology

## 2017-08-19 ENCOUNTER — Encounter: Payer: Self-pay | Admitting: Urology

## 2017-08-19 VITALS — BP 115/73 | HR 83 | Ht 72.0 in | Wt 276.0 lb

## 2017-08-19 DIAGNOSIS — E559 Vitamin D deficiency, unspecified: Secondary | ICD-10-CM | POA: Diagnosis not present

## 2017-08-19 DIAGNOSIS — N401 Enlarged prostate with lower urinary tract symptoms: Secondary | ICD-10-CM | POA: Diagnosis not present

## 2017-08-19 DIAGNOSIS — N138 Other obstructive and reflux uropathy: Secondary | ICD-10-CM

## 2017-08-19 DIAGNOSIS — N3942 Incontinence without sensory awareness: Secondary | ICD-10-CM

## 2017-08-20 ENCOUNTER — Telehealth: Payer: Self-pay | Admitting: Family Medicine

## 2017-08-20 LAB — VITAMIN D 25 HYDROXY (VIT D DEFICIENCY, FRACTURES): Vit D, 25-Hydroxy: 50.2 ng/mL (ref 30.0–100.0)

## 2017-08-20 NOTE — Telephone Encounter (Signed)
Patient notified and voiced understanding.

## 2017-08-20 NOTE — Telephone Encounter (Signed)
-----   Message from Nori Riis, PA-C sent at 08/20/2017  8:03 AM EDT ----- Please let Mr. Whitefield know that his vitamin d is normal.

## 2017-08-31 ENCOUNTER — Telehealth (INDEPENDENT_AMBULATORY_CARE_PROVIDER_SITE_OTHER): Payer: Self-pay | Admitting: Specialist

## 2017-08-31 NOTE — Telephone Encounter (Signed)
Referral  Does patient need to come to the neuro surgery or does the patient need to be referred to neurology

## 2017-08-31 NOTE — Telephone Encounter (Signed)
Referral  Does patient need to come to the neuro surgery or does the patient need to be referred to neurology.

## 2017-09-01 DIAGNOSIS — R413 Other amnesia: Secondary | ICD-10-CM | POA: Insufficient documentation

## 2017-09-01 DIAGNOSIS — G5712 Meralgia paresthetica, left lower limb: Secondary | ICD-10-CM | POA: Insufficient documentation

## 2017-09-01 DIAGNOSIS — R270 Ataxia, unspecified: Secondary | ICD-10-CM | POA: Insufficient documentation

## 2017-09-01 DIAGNOSIS — R5381 Other malaise: Secondary | ICD-10-CM | POA: Insufficient documentation

## 2017-09-01 DIAGNOSIS — M7061 Trochanteric bursitis, right hip: Secondary | ICD-10-CM | POA: Insufficient documentation

## 2017-09-08 ENCOUNTER — Encounter (INDEPENDENT_AMBULATORY_CARE_PROVIDER_SITE_OTHER): Payer: Self-pay | Admitting: Specialist

## 2017-09-08 ENCOUNTER — Ambulatory Visit (INDEPENDENT_AMBULATORY_CARE_PROVIDER_SITE_OTHER): Payer: Managed Care, Other (non HMO) | Admitting: Specialist

## 2017-09-08 VITALS — BP 111/69 | HR 70 | Ht 72.0 in | Wt 262.0 lb

## 2017-09-08 DIAGNOSIS — M5417 Radiculopathy, lumbosacral region: Secondary | ICD-10-CM

## 2017-09-08 DIAGNOSIS — M5136 Other intervertebral disc degeneration, lumbar region: Secondary | ICD-10-CM | POA: Diagnosis not present

## 2017-09-08 DIAGNOSIS — R270 Ataxia, unspecified: Secondary | ICD-10-CM

## 2017-09-08 DIAGNOSIS — R29818 Other symptoms and signs involving the nervous system: Secondary | ICD-10-CM | POA: Diagnosis not present

## 2017-09-08 NOTE — Progress Notes (Signed)
Office Visit Note   Patient: Eddie Carter           Date of Birth: 01/05/1941           MRN: [id number] Visit Date: 09/08/2017              Requested by: [name], MD [address] [place], [place] [address] PCP: [name], MD   Assessment & Plan: Visit Diagnoses:  1. Lumbosacral radiculopathy   2. 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 May use ice or moist heat for pain. Weight loss is of benefit. Handicap license is approved. Fall Prevention and Home Safety Falls cause injuries and can affect all age groups. It is possible to use preventive measures to significantly decrease the likelihood of falls. There are many simple measures which can make your home safer and prevent falls. OUTDOORS  Repair cracks and edges of walkways and driveways.  Remove high doorway thresholds.  Trim shrubbery on the main path into your home.  Have good outside lighting.  Clear walkways of tools, rocks, debris, and clutter.  Check that handrails are not broken and are securely fastened. Both sides of steps should have handrails.  Have leaves, snow, and ice cleared regularly.  Use sand or salt on walkways during winter months.  In the garage, clean up grease or oil spills. BATHROOM  Install night lights.  Install grab bars by the toilet and in the tub and shower.  Use non-skid mats or decals in the tub or shower.  Place a plastic non-slip stool in the shower to sit on, if needed.  Keep floors dry and clean up all water on the floor immediately.  Remove soap buildup in the tub or shower on a regular basis.  Secure bath mats with non-slip, double-sided rug tape.  Remove throw rugs and tripping hazards from the floors. BEDROOMS  Install night lights.  Make sure a bedside light is easy to reach.  Do  not use oversized bedding.  Keep a telephone by your bedside.  Have a firm chair with side arms to use for getting dressed.  Remove throw rugs and tripping hazards from the floor. KITCHEN  Keep handles on pots and pans turned toward the center of the stove. Use back burners when possible.  Clean up spills quickly and allow time for drying.  Avoid walking on wet floors.  Avoid hot utensils and knives.  Position shelves so they are not too high or low.  Place commonly used objects within easy reach.  If necessary, use a sturdy step stool with a grab bar when reaching.  Keep electrical cables out of the way.  Do not use floor polish or wax that makes floors slippery. If you must use wax, use non-skid floor wax.  Remove throw rugs and tripping hazards from the floor. STAIRWAYS  Never leave objects on stairs.  Place handrails on both sides of stairways and use them. Fix any loose handrails. Make sure handrails on both sides of the stairways are as long as the stairs.  Check carpeting to make sure it is firmly attached along stairs. Make repairs to worn or loose carpet promptly.  Avoid placing throw rugs at the top or bottom of stairways, or properly secure the rug with carpet tape to prevent slippage. Get rid of throw rugs, if  possible.  Have an electrician put in a light switch at the top and bottom of the stairs. OTHER FALL PREVENTION TIPS  Wear low-heel or rubber-soled shoes that are supportive and fit well. Wear closed toe shoes.  When using a stepladder, make sure it is fully opened and both spreaders are firmly locked. Do not climb a closed stepladder.  Add color or contrast paint or tape to grab bars and handrails in your home. Place contrasting color strips on first and last steps.  Learn and use mobility aids as needed. Install an electrical emergency response system.  Turn on lights to avoid dark areas. Replace light bulbs that burn out immediately. Get light  switches that glow.  Arrange furniture to create clear pathways. Keep furniture in the same place.  Firmly attach carpet with non-skid or double-sided tape.  Eliminate uneven floor surfaces.  Select a carpet pattern that does not visually hide the edge of steps.  Be aware of all pets. OTHER HOME SAFETY TIPS  Set the water temperature for 120 F (48.8 C).  Keep emergency numbers on or near the telephone.  Keep smoke detectors on every level of the home and near sleeping areas. Document Released: 10/30/2002 Document Revised: 05/10/2012 Document Reviewed: 01/29/2012 ExiAvoid overhead lifting and overhead use of the arms. Do not lift greater than 10 lbs. Tylenol ES one every 6-8 hours for pain and inflamation. a home exercise program.[employer] Patient Information 2014 [place].  Follow-Up Instructions: No Follow-up on file.   Orders:  No orders of the defined types were placed in this encounter.  No orders of the defined types were placed in this encounter.     Procedures: No procedures performed   Clinical Data: No additional findings.   Subjective: Chief Complaint  Patient presents with  . Lower Back - Follow-up  . Right Shoulder - Follow-up    76 year old male with history of left L3-4 microdiscectomy and redo left microdiscectomy, 10/2014 and 02/12/2015. He is having problemis with persistent pain in the left leg with associated Left leg weakness. Now being seen and followed by a neurologist, Dr. Melrose Nakayama in [place], previously he saw Dr. Stark Klein of [facility]. Now being treated with a medication to help with memory. Lab work so far negative for vitamin deficiency and significant elevation of HgbA1c. He is being seen by home PT, and has been instructed to do standing up to 10 times a day.    Review of Systems  Constitutional: Negative.   HENT: Negative.   Eyes: Negative.   Respiratory: Negative.   Cardiovascular: Negative.   Gastrointestinal:  Negative.   Endocrine: Negative.   Genitourinary: Negative.   Musculoskeletal: Negative.   Skin: Negative.   Allergic/Immunologic: Negative.   Neurological: Negative.   Hematological: Negative.   Psychiatric/Behavioral: Negative.      Objective: Vital Signs: BP 111/69 (BP Location: Left Arm, Patient Position: Sitting)   Pulse 70   Ht 6' (1.829 m)   Wt 262 lb (118.8 kg)   BMI 35.53 kg/m   Physical Exam  Constitutional: He is oriented to person, place, and time. He appears well-developed and well-nourished.  HENT:  Head: Normocephalic and atraumatic.  Eyes: Pupils are equal, round, and reactive to light. EOM are normal.  Neck: Normal range of motion. Neck supple.  Pulmonary/Chest: Effort normal and breath sounds normal.  Abdominal: Soft. Bowel sounds are normal.  Neurological: He is alert and oriented to person, place, and time.  Skin: Skin is warm and dry.  Psychiatric: He has a normal mood and affect. His behavior is normal. Judgment and thought content normal.    Back Exam   Tenderness  The patient is experiencing tenderness in the lumbar.  Range of Motion  Extension: normal  Flexion: abnormal  Lateral Bend Right: abnormal  Lateral Bend Left: abnormal  Rotation Right: abnormal  Rotation Left: abnormal   Muscle Strength  Right Quadriceps:  5/5  Left Quadriceps:  4/5  Right Hamstrings:  5/5  Left Hamstrings:  5/5   Tests  Straight leg raise right: negative Straight leg raise left: negative  Reflexes  Patellar:  0/4 abnormal Achilles: normal Biceps: normal Babinski's sign: normal   Other  Toe Walk: normal Heel Walk: normal Sensation: normal Gait: abnormal  Erythema: no back redness Scars: absent   Right Shoulder Exam   Tenderness  The patient is experiencing tenderness in the acromion.  Range of Motion  Active Abduction:  140 abnormal  Passive Abduction: abnormal  Extension: abnormal  Forward Flexion:  150 abnormal  External Rotation:  80  abnormal  Internal Rotation 0 degrees:  L3 normal  Internal Rotation 90 degrees:  80 normal   Muscle Strength  Abduction: 4/5  Internal Rotation: 5/5  External Rotation: 4/5  Supraspinatus: 4/5  Subscapularis: 5/5  Biceps: 5/5   Tests  Apprehension: positive Drop Arm: positive  Other  Erythema: absent Scars: absent Sensation: normal Pulse: present      Specialty Comments:  No specialty comments available.  Imaging: No results found.   PMFS History: Patient Active Problem List   Diagnosis Date Noted  . Closed compression fracture of L1 lumbar vertebra ([place]) 07/15/2017  . Back pain 07/15/2017  . Aspiration into airway 07/15/2017  . Wheezing 07/15/2017  . Leukocytosis 07/15/2017  . Bilateral calf pain 07/15/2017  . Low back pain 07/13/2017  . Cephalalgia 01/29/2016  . Adiposity 01/29/2016  . Hypertriglyceridemia 01/29/2016  . H/O respiratory system disease 01/29/2016  . H/O erectile dysfunction 01/29/2016  . Cerebrovascular accident, old 01/29/2016  . Diabetes mellitus ([place]) 01/29/2016  . Chronic obstructive pulmonary disease ([place]) 01/29/2016  . Benign fibroma of prostate 01/29/2016  . Chronic pain 01/29/2016  . 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 Abnormal MRI, lumbar spine 01/29/2016  . Chronic low back pain (Location of Primary Source of Pain) (Bilateral) (L>R) 01/29/2016  . Chronic lower extremity pain (Location of Secondary source of pain) (Bilateral) (L>R) 01/29/2016  . Long term current use of opiate analgesic 01/29/2016  . Long term prescription opiate use 01/29/2016  . Opiate use 01/29/2016  . Encounter for therapeutic drug level monitoring 01/29/2016  . Encounter for pain management planning 01/29/2016  . Chronic lumbar radicular pain (Location of Secondary source of pain) (Bilateral) (L4 Dermatome) (L>R) 01/29/2016  . Chronic hip pain (Location of Tertiary source of pain) (Bilateral) (L>R) 01/29/2016  . Chronic knee pain (Bilateral) (L>R)  01/29/2016  . Chronic anticoagulation (Plavix) 01/29/2016  . Erectile dysfunction of organic origin 01/22/2016  . BPH with obstruction/lower urinary tract symptoms 07/24/2015  . Hypogonadism in male 07/24/2015  . HNP (herniated nucleus pulposus), lumbar 11/05/2014    Class: Acute  . Herniated nucleus pulposus, lumbar 11/05/2014  . Pulmonary nodules 10/25/2013  . ILD (interstitial lung disease) ([place]) 08/22/2013  . Traumatic tear of lateral meniscus of right knee   . Medial meniscus, posterior horn derangement   . COPD (chronic obstructive pulmonary disease) ([place])   . Shortness of breath   . Sleep apnea   . Stroke ([place])   .  Arthritis   . GERD (gastroesophageal reflux disease)   . History of GI bleed   . HOH (hard of hearing)   . Full dentures   . Complication of anesthesia    Past Medical History:  Diagnosis Date  . Arthritis   . Asthma   . BPH (benign prostatic hyperplasia)   . 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 Complication of anesthesia    after septoplasty and uvulectomy-was in [facility] 10 yrs. ago  . COPD (chronic obstructive pulmonary disease) ([place])   . Diabetes mellitus without complication ([place])   . ED (erectile dysfunction)   . Full dentures   . GERD (gastroesophageal reflux disease)    no meds  . Headache    difficulty with headaches- 1950's , again in 1967, 2008- again problems with migrraines   . History of GI bleed   . History of urethral stricture   . HOH (hard of hearing)   . Hypogonadism in male   . Joint pain   . Medial meniscus, posterior horn derangement    left knee  . Obesity   . Pneumonia 2014   [facility]  . Shortness of breath   . Skin cancer   . Sleep apnea    uses a cpap, most nights   . Stroke [facility]) 1995   some speech and thought processes slow  . Traumatic tear of lateral meniscus of right knee    right knee  . Urinary frequency   . Urinary incontinence     Family History  Problem Relation Age of Onset  . Cancer  Mother   . Heart attack Father   . Heart disease Unknown   . Arthritis Unknown   . Prostate cancer Neg Hx   . Kidney disease Neg Hx     Past Surgical History:  Procedure Laterality Date  . APPENDECTOMY  1963  . CARPAL TUNNEL RELEASE     left  . COLONOSCOPY    . COLONOSCOPY WITH PROPOFOL N/A 12/07/2016   Procedure: COLONOSCOPY WITH PROPOFOL;  Surgeon: Lollie Sails, MD;  Location: [facility] ENDOSCOPY;  Service: Endoscopy;  Laterality: N/A;  . EYE SURGERY     foreign body removed fr. eye, ? side   . FOOT FOREIGN BODY REMOVAL  1976   left foot -multiple pieces glass  . HEMORROIDECTOMY  1995  . KNEE ARTHROSCOPY Bilateral 01/17/2013   Procedure: ARTHROSCOPY KNEE BILATERAL WITH MEDIAL AND LATERAL MENISECTOMIES;  Surgeon: Lorn Junes, MD;  Location: [place];  Service: Orthopedics;  Laterality: Bilateral;  . KYPHOPLASTY N/A 07/23/2017   Procedure: KYPHOPLASTY- L1;  Surgeon: Hessie Knows, MD;  Location: [facility] ORS;  Service: Orthopedics;  Laterality: N/A;  . LUMBAR LAMINECTOMY N/A 11/05/2014   Procedure: LEFT L3-4 MICRODISCECTOMY;  Surgeon: Jessy Oto, MD;  Location: [place];  Service: Orthopedics;  Laterality: N/A;  . LUMBAR LAMINECTOMY/DECOMPRESSION MICRODISCECTOMY N/A 02/12/2015   Procedure: RE-DO LEFT L3-4 MICRODISCECTOMY;  Surgeon: Jessy Oto, MD;  Location: [place];  Service: Orthopedics;  Laterality: N/A;  . NASAL SEPTUM SURGERY  1995  . TONSILLECTOMY    . UPPER GI ENDOSCOPY    . UVULOPALATOPHARYNGOPLASTY (UPPP)/TONSILLECTOMY/SEPTOPLASTY  1995   same time with septoplasty-ended up ICU almost trached   Social History   Occupational History  .      register of deeds   Social History Main Topics  . Smoking status: Former Smoker    Packs/day: 1.50    Years: 40.00  Types: Cigarettes    Quit date: 01/14/1996  . Smokeless tobacco: Never Used  . Alcohol use No     Comment: wine in a month  . Drug use: No  . Sexual activity: No

## 2017-09-08 NOTE — Patient Instructions (Addendum)
Avoid frequent bending and stooping  No lifting greater than 10 lbs. May use ice or moist heat for pain. Weight loss is of benefit. Handicap license is approved.  Fall Prevention and Home Safety Falls cause injuries and can affect all age groups. It is possible to use preventive measures to significantly decrease the likelihood of falls. There are many simple measures which can make your home safer and prevent falls. OUTDOORS  Repair cracks and edges of walkways and driveways.  Remove high doorway thresholds.  Trim shrubbery on the main path into your home.  Have good outside lighting.  Clear walkways of tools, rocks, debris, and clutter.  Check that handrails are not broken and are securely fastened. Both sides of steps should have handrails.  Have leaves, snow, and ice cleared regularly.  Use sand or salt on walkways during winter months.  In the garage, clean up grease or oil spills. BATHROOM  Install night lights.  Install grab bars by the toilet and in the tub and shower.  Use non-skid mats or decals in the tub or shower.  Place a plastic non-slip stool in the shower to sit on, if needed.  Keep floors dry and clean up all water on the floor immediately.  Remove soap buildup in the tub or shower on a regular basis.  Secure bath mats with non-slip, double-sided rug tape.  Remove throw rugs and tripping hazards from the floors. BEDROOMS  Install night lights.  Make sure a bedside light is easy to reach.  Do not use oversized bedding.  Keep a telephone by your bedside.  Have a firm chair with side arms to use for getting dressed.  Remove throw rugs and tripping hazards from the floor. KITCHEN  Keep handles on pots and pans turned toward the center of the stove. Use back burners when possible.  Clean up spills quickly and allow time for drying.  Avoid walking on wet floors.  Avoid hot utensils and knives.  Position shelves so they are not too high or  low.  Place commonly used objects within easy reach.  If necessary, use a sturdy step stool with a grab bar when reaching.  Keep electrical cables out of the way.  Do not use floor polish or wax that makes floors slippery. If you must use wax, use non-skid floor wax.  Remove throw rugs and tripping hazards from the floor. STAIRWAYS  Never leave objects on stairs.  Place handrails on both sides of stairways and use them. Fix any loose handrails. Make sure handrails on both sides of the stairways are as long as the stairs.  Check carpeting to make sure it is firmly attached along stairs. Make repairs to worn or loose carpet promptly.  Avoid placing throw rugs at the top or bottom of stairways, or properly secure the rug with carpet tape to prevent slippage. Get rid of throw rugs, if possible.  Have an electrician put in a light switch at the top and bottom of the stairs. OTHER FALL PREVENTION TIPS  Wear low-heel or rubber-soled shoes that are supportive and fit well. Wear closed toe shoes.  When using a stepladder, make sure it is fully opened and both spreaders are firmly locked. Do not climb a closed stepladder.  Add color or contrast paint or tape to grab bars and handrails in your home. Place contrasting color strips on first and last steps.  Learn and use mobility aids as needed. Install an electrical emergency response system.  Turn on  lights to avoid dark areas. Replace light bulbs that burn out immediately. Get light switches that glow.  Arrange furniture to create clear pathways. Keep furniture in the same place.  Firmly attach carpet with non-skid or double-sided tape.  Eliminate uneven floor surfaces.  Select a carpet pattern that does not visually hide the edge of steps.  Be aware of all pets. OTHER HOME SAFETY TIPS  Set the water temperature for 120 F (48.8 C).  Keep emergency numbers on or near the telephone.  Keep smoke detectors on every level of the  home and near sleeping areas. Document Released: 10/30/2002 Document Revised: 05/10/2012 Document Reviewed: 01/29/2012 [facility] Patient Information 2014 [place]. Avoid overhead lifting and overhead use of the arms. Do not lift greater than 10 lbs. Tylenol ES one every 6-8 hours for pain and inflamation. a home exercise program.

## 2017-09-13 ENCOUNTER — Telehealth (INDEPENDENT_AMBULATORY_CARE_PROVIDER_SITE_OTHER): Payer: Self-pay | Admitting: Radiology

## 2017-09-13 NOTE — Telephone Encounter (Signed)
He is currently being seen at [facility] neurology, this is closer to his home and has an appointment to follow up next month. Does not need a referral at this point as he is being followed and the neurologist has noted results of brain CT. jen

## 2017-09-13 NOTE — Telephone Encounter (Signed)
Did you want this patient seen at [facility] or is he to be seen by neurology in [place].  Inez Catalina from [facility] is calling to find out where he needs to be seen.  Looks like in your note that he is currently being followed by Dr. Melrose Nakayama in [place].  Please advise.

## 2017-09-13 NOTE — Telephone Encounter (Signed)
No, cancel referral or neurosurgery or neurology eval, he is being seen by [facility] neurologist for his disorientation, confusion and falls. Clarified at his last visit, last week. jen

## 2017-09-14 NOTE — Progress Notes (Signed)
9:01 AM   Eddie Carter 1941/09/03 [id number]  Referring provider: [name], MD [address] [place], [place] [address]  Chief Complaint  Patient presents with  . Follow-up    HPI: Patient is a 76 year old Caucasian male with testosterone deficiency, erectile dysfunction and BPH with LUTS who presents today  for further discussion on his urinary symptoms.      BPH with LUTS His IPSS score is 9, which is moderate lower urinary tract symptomatology. He is mostly dissatisfied with his quality life due to his urinary symptoms.   His previous I PSS score was 11/3.  His previous PVR was 15 mL.  His major complaint today is incontinence.  He has had these symptoms for over 10 years.  He denies any dysuria, hematuria or suprapubic pain.   At his visit one month ago, he was unsure if he was getting his medications (tamsulosin, dutasteride and Vesicare) consistently.  He has now been taking the medication consistently and feels that his urinary symptoms are much improved.  His has had an urethral stricture that had been dilated in the remote past.  He also denies any recent fevers, chills, nausea or vomiting.  He does not have a family history of PCa. IPSS    Row Name 08/19/17 1600         International Prostate Symptom Score   How often have you had the sensation of not emptying your bladder?  Less than 1 in 5     How often have you had to urinate less than every two hours?  Less than 1 in 5 times     How often have you found you stopped and started again several times when you urinated?  Not at All     How often have you found it difficult to postpone urination?  More than half the time     How often have you had a weak urinary stream?  Less than half the time     How often have you had to strain to start urination?  Not at All     How many times did you typically get up at night to urinate?  1 Time     Total IPSS Score  9       Quality of Life due to urinary symptoms   If you  were to spend the rest of your life with your urinary condition just the way it is now how would you feel about that?  Mostly Disatisfied        Score:  1-7 Mild 8-19 Moderate 20-35 Severe   PMH: Past Medical History:  Diagnosis Date  . Arthritis   . Asthma   . BPH (benign prostatic hyperplasia)   . 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 Complication of anesthesia    after septoplasty and uvulectomy-was in icu- [facility] - 10 yrs. ago  . COPD (chronic obstructive pulmonary disease) ([place])   . Diabetes mellitus without complication ([place])   . ED (erectile dysfunction)   . Full dentures   . GERD (gastroesophageal reflux disease)    no meds  . Headache    difficulty with headaches- 1950's , again in 1967, 2008- again problems with migrraines   . History of GI bleed   . History of urethral stricture   . HOH (hard of hearing)   . Hypogonadism in male   . Joint pain   . Medial meniscus, posterior horn derangement  left knee  . Obesity   . Pneumonia 2014   [facility]  . Shortness of breath   . Skin cancer   . Sleep apnea    uses a cpap, most nights   . Stroke [facility]) 1995   some speech and thought processes slow  . Traumatic tear of lateral meniscus of right knee    right knee  . Urinary frequency   . Urinary incontinence     Surgical History: Past Surgical History:  Procedure Laterality Date  . APPENDECTOMY  1963  . CARPAL TUNNEL RELEASE     left  . COLONOSCOPY    . EYE SURGERY     foreign body removed fr. eye, ? side   . FOOT FOREIGN BODY REMOVAL  1976   left foot -multiple pieces glass  . HEMORROIDECTOMY  1995  . NASAL SEPTUM SURGERY  1995  . TONSILLECTOMY    . UPPER GI ENDOSCOPY    . UVULOPALATOPHARYNGOPLASTY (UPPP)/TONSILLECTOMY/SEPTOPLASTY  1995   same time with septoplasty-ended up ICU almost trached    Home Medications:  Allergies as of 09/16/2017      Reactions   Asa [aspirin] Shortness Of Breath   Nsaids Other (See Comments)   Makes him bleed.       Medication List        Accurate as of 09/16/17 11:59 PM. Always use your most recent med list.          acetaminophen 500 MG tablet Commonly known as:  TYLENOL Take 500 mg by mouth every 8 (eight) hours as needed.   AFLURIA QUADRIVALENT injection Generic drug:  influenza vac split quadrivalent Afluria Quad 2017-2018 60 mcg/0.5 mL intramuscular suspension   albuterol 108 (90 Base) MCG/ACT inhaler Commonly known as:  PROVENTIL HFA;VENTOLIN HFA Inhale into the lungs.   ALCORTIN A 1-2-1 % Gel Apply topically.   ANDROGEL PUMP 20.25 MG/ACT (1.62%) Gel Generic drug:  Testosterone AndroGel 20.25 mg/1.25 gram (1.62 %) transdermal gel pump   budesonide-formoterol 160-4.5 MCG/ACT inhaler Commonly known as:  SYMBICORT Inhale 2 puffs into the lungs 2 (two) times daily.   buPROPion 150 MG 24 hr tablet Commonly known as:  WELLBUTRIN XL bupropion HCl XL 150 mg 24 hr tablet, extended release   CINNAMON PO Take 4,000 mg by mouth daily at 2 PM.   ciprofloxacin 500 MG tablet Commonly known as:  CIPRO ciprofloxacin 500 mg tablet   clopidogrel 75 MG tablet Commonly known as:  PLAVIX Take 1 tablet (75 mg total) by mouth daily.   Co Q-10 100 MG Caps Take 1 tablet by mouth at bedtime.   docusate sodium 100 MG capsule Commonly known as:  COLACE Take 1 capsule (100 mg total) by mouth 2 (two) times daily.   dutasteride 0.5 MG capsule Commonly known as:  AVODART Take 1 capsule by mouth daily.   fluticasone 50 MCG/ACT nasal spray Commonly known as:  FLONASE Place 2 sprays into both nostrils daily.   furosemide 20 MG tablet Commonly known as:  LASIX furosemide 20 mg tablet   hydrOXYzine 10 MG tablet Commonly known as:  ATARAX/VISTARIL Take 2 tablets by mouth at bedtime.   levofloxacin 750 MG tablet Commonly known as:  LEVAQUIN levofloxacin 750 mg tablet   levothyroxine 50 MCG tablet Commonly known as:  SYNTHROID, LEVOTHROID Take 50 mcg by mouth daily. Take on an  empty stomach with a glass of water at least 30-60 minutes before breakfast.   LINZESS 145 MCG Caps capsule Generic drug:  linaclotide Linzess  145 mcg capsule   LYRICA 50 MG capsule Generic drug:  pregabalin Lyrica 50 mg capsule   Magnesium Citrate 100 MG Tabs Take by mouth.   methylPREDNISolone 4 MG Tbpk tablet Commonly known as:  MEDROL DOSEPAK methylprednisolone 4 mg tablets in a dose pack   mirabegron ER 25 MG Tb24 tablet Commonly known as:  MYRBETRIQ Take 1 tablet (25 mg total) by mouth daily.   montelukast 10 MG tablet Commonly known as:  SINGULAIR montelukast 10 mg tablet   multivitamin capsule Take 1 capsule by mouth daily.   MULTI-VITAMINS Tabs Take by mouth.   oxyCODONE-acetaminophen 7.5-325 MG tablet Commonly known as:  PERCOCET Take 1 tablet by mouth every 8 (eight) hours as needed for severe pain.   polyethylene glycol packet Commonly known as:  MIRALAX / GLYCOLAX Take 17 g by mouth daily as needed. Reported on 01/21/2016   RA KRILL OIL 500 MG Caps Take by mouth.   senna 8.6 MG Tabs tablet Commonly known as:  SENOKOT Take 1 tablet (8.6 mg total) by mouth 2 (two) times daily.   tamsulosin 0.4 MG Caps capsule Commonly known as:  FLOMAX Take 1 capsule by mouth daily.   tiotropium 18 MCG inhalation capsule Commonly known as:  SPIRIVA Place 1 capsule (18 mcg total) into inhaler and inhale daily.   topiramate 200 MG tablet Commonly known as:  TOPAMAX Take 1 tablet by mouth daily.   traMADol 50 MG tablet Commonly known as:  ULTRAM Take 1 tablet (50 mg total) by mouth every 6 (six) hours as needed for moderate pain.   VESICARE 10 MG tablet Generic drug:  solifenacin Take 1 tablet by mouth daily.   Vitamin D 1000 units capsule Take 3,000 Units by mouth daily.       Allergies:  Allergies  Allergen Reactions  . Asa [Aspirin] Shortness Of Breath  . Nsaids Other (See Comments)    Makes him bleed.    Family History: Family History    Problem Relation Age of Onset  . Cancer Mother   . Heart attack Father   . Heart disease Unknown   . Arthritis Unknown   . Prostate cancer Neg Hx   . Kidney disease Neg Hx     Social History:  reports that he quit smoking about 21 years ago. His smoking use included cigarettes. He has a 60.00 pack-year smoking history. he has never used smokeless tobacco. He reports that he does not drink alcohol or use drugs.  ROS: UROLOGY Frequent Urination?: No Hard to postpone urination?: No Burning/pain with urination?: No Get up at night to urinate?: No Leakage of urine?: Yes Urine stream starts and stops?: No Trouble starting stream?: No Do you have to strain to urinate?: No Blood in urine?: No Urinary tract infection?: No Sexually transmitted disease?: No Injury to kidneys or bladder?: No Painful intercourse?: No Weak stream?: No Erection problems?: No Penile pain?: No  Gastrointestinal Nausea?: No Vomiting?: No Indigestion/heartburn?: No Diarrhea?: No Constipation?: No  Constitutional Fever: No Night sweats?: No Weight loss?: No Fatigue?: No  Skin Skin rash/lesions?: No Itching?: Yes  Eyes Blurred vision?: No Double vision?: No  Ears/Nose/Throat Sore throat?: No Sinus problems?: No  Hematologic/Lymphatic Swollen glands?: No Easy bruising?: No  Cardiovascular Leg swelling?: No Chest pain?: No  Respiratory Cough?: No Shortness of breath?: No  Endocrine Excessive thirst?: No  Musculoskeletal Back pain?: Yes Joint pain?: Yes  Neurological Headaches?: Yes Dizziness?: No  Psychologic Depression?: No Anxiety?: No  Physical Exam: BP  129/77   Pulse 72   Ht 6' (1.829 m)   Wt 276 lb (125.2 kg)   BMI 37.43 kg/m   Both patient and caregiver smelled strongly of marijuana GU: Patient with uncircumcised phallus. Foreskin easily retracted  Urethral meatus is patent.  No penile discharge. No penile lesions or rashes. Scrotum without lesions, cysts,  rashes and/or edema.  Testicles are located scrotally bilaterally. No masses are appreciated in the testicles. Left and right epididymis are normal. Rectal: Patient with  normal sphincter tone. Perineum without scarring or rashes. No rectal masses are appreciated. Prostate is approximately 35 grams, no nodules are appreciated. Seminal vesicles are normal.   Laboratory Data: Lab Results  Component Value Date   WBC 12.0 (H) 07/15/2017   HGB 13.8 07/15/2017   HCT 40.6 07/15/2017   MCV 91.9 07/15/2017   PLT 163 07/15/2017    Lab Results  Component Value Date   CREATININE 0.87 07/13/2017    PSA history:  Less than 0.1 ng/mL on 04/04/2014  Less than 0.1 ng/mL on 10/04/2014  0.3 ng/mL on 01/28/2015  0.1 ng/mL on 01/16/2016   Lab Results  Component Value Date   HGBA1C 5.7 (H) 05/31/2017   I have reviewed the labs   Assessment & Plan:    1. BPH with LUTS  - IPSS score is 9/4, it is improving  - Continue conservative management, avoiding bladder irritants and timed voiding's  - most bothersome symptoms is/are incontinence  - Continue tamsulosin 0.4 mg daily and dutasteride 0.5 mg daily; refills given  - RTC in 3 months for IPS'S and PVR  2. Urinary incontinence  - PVR was 15 mL  - continue Vesicare 10 mg daily  - feels that his symptoms are improving now that he is getting medications consistently  - RTC in 3 months for I PSS and PVR   Return in about 3 months (around 12/17/2017) for IPSS and PVR.  Zara Council, [facility] [address] [place], [place] [address] [phone]

## 2017-09-14 NOTE — Telephone Encounter (Signed)
Order has been cancelled.  

## 2017-09-16 ENCOUNTER — Ambulatory Visit (INDEPENDENT_AMBULATORY_CARE_PROVIDER_SITE_OTHER): Payer: Managed Care, Other (non HMO) | Admitting: Urology

## 2017-09-16 VITALS — BP 129/77 | HR 72 | Ht 72.0 in | Wt 276.0 lb

## 2017-09-16 DIAGNOSIS — N138 Other obstructive and reflux uropathy: Secondary | ICD-10-CM | POA: Diagnosis not present

## 2017-09-16 DIAGNOSIS — N3942 Incontinence without sensory awareness: Secondary | ICD-10-CM

## 2017-09-16 DIAGNOSIS — N401 Enlarged prostate with lower urinary tract symptoms: Secondary | ICD-10-CM | POA: Diagnosis not present

## 2017-09-16 MED ORDER — MIRABEGRON ER 25 MG PO TB24: 25.0000 mg | ORAL_TABLET | Freq: Every day | ORAL | 12 refills | Status: DC

## 2017-09-28 ENCOUNTER — Encounter: Payer: Self-pay | Admitting: Urology

## 2017-10-11 DIAGNOSIS — R519 Headache, unspecified: Secondary | ICD-10-CM | POA: Insufficient documentation

## 2017-10-11 DIAGNOSIS — G8929 Other chronic pain: Secondary | ICD-10-CM | POA: Insufficient documentation

## 2017-10-11 DIAGNOSIS — M25561 Pain in right knee: Secondary | ICD-10-CM | POA: Insufficient documentation

## 2017-11-04 DIAGNOSIS — A6920 Lyme disease, unspecified: Secondary | ICD-10-CM | POA: Insufficient documentation

## 2017-11-29 NOTE — Progress Notes (Signed)
12:15 PM   Eddie Carter [birth year] [id number]  Referring provider: [name], MD [address] [place], [place] [address]  Chief Complaint  Patient presents with  . Benign Prostatic Hypertrophy    HPI: Patient is a 77 year old Caucasian male with testosterone deficiency, erectile dysfunction and BPH with LUTS who presents today  for further discussion on his urinary symptoms with his caregiver, Eddie Carter.    BPH with LUTS His IPSS score is 30, which is severe lower urinary tract symptomatology. He is terrible with his quality life due to his urinary symptoms.   His PVR is 15 mL.  His previous I PSS score was 9/4  His previous PVR was 15 mL.  His major complaint today is incontinence.  He has had these symptoms for over 10 years.  He denies any dysuria, hematuria or suprapubic pain.   He has been without Mybetriq for the last several weeks and his incontinence has worsened.  His has had an urethral stricture that had been dilated in the remote past.  He also denies any recent fevers, chills, nausea or vomiting.  He does not have a family history of PCa. IPSS    Row Name 11/30/17 1100         International Prostate Symptom Score   How often have you had the sensation of not emptying your bladder?  About half the time     How often have you had to urinate less than every two hours?  About half the time     How often have you found you stopped and started again several times when you urinated?  More than half the time     How often have you found it difficult to postpone urination?  Almost always     How often have you had a weak urinary stream?  Almost always     How often have you had to strain to start urination?  Almost always     How many times did you typically get up at night to urinate?  5 Times     Total IPSS Score  30       Quality of Life due to urinary symptoms   If you were to spend the rest of your life with your urinary condition just the way it is now how would  you feel about that?  Terrible        Score:  1-7 Mild 8-19 Moderate 20-35 Severe   PMH: Past Medical History:  Diagnosis Date  . Arthritis   . Asthma   . BPH (benign prostatic hyperplasia)   . 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 Complication of anesthesia    after septoplasty and uvulectomy-was in icu- [facility] - 10 yrs. ago  . COPD (chronic obstructive pulmonary disease) ([place])   . Diabetes mellitus without complication ([place])   . ED (erectile dysfunction)   . Full dentures   . GERD (gastroesophageal reflux disease)    no meds  . Headache    difficulty with headaches- 1950's , again in 1967, 2008- again problems with migrraines   . History of GI bleed   . History of urethral stricture   . HOH (hard of hearing)   . Hypogonadism in male   . Joint pain   . Medial meniscus, posterior horn derangement    left knee  . Obesity   . Pneumonia 2014   [facility]  . Shortness of breath   . Skin  cancer   . Sleep apnea    uses a cpap, most nights   . Stroke [facility]) 1995   some speech and thought processes slow  . Traumatic tear of lateral meniscus of right knee    right knee  . Urinary frequency   . Urinary incontinence     Surgical History: Past Surgical History:  Procedure Laterality Date  . APPENDECTOMY  1963  . CARPAL TUNNEL RELEASE     left  . COLONOSCOPY    . COLONOSCOPY WITH PROPOFOL N/A 12/07/2016   Procedure: COLONOSCOPY WITH PROPOFOL;  Surgeon: Lollie Sails, MD;  Location: [facility] ENDOSCOPY;  Service: Endoscopy;  Laterality: N/A;  . EYE SURGERY     foreign body removed fr. eye, ? side   . FOOT FOREIGN BODY REMOVAL  1976   left foot -multiple pieces glass  . HEMORROIDECTOMY  1995  . KNEE ARTHROSCOPY Bilateral 01/17/2013   Procedure: ARTHROSCOPY KNEE BILATERAL WITH MEDIAL AND LATERAL MENISECTOMIES;  Surgeon: Lorn Junes, MD;  Location: [place];  Service: Orthopedics;  Laterality: Bilateral;  . KYPHOPLASTY N/A 07/23/2017   Procedure:  KYPHOPLASTY- L1;  Surgeon: Hessie Knows, MD;  Location: [facility] ORS;  Service: Orthopedics;  Laterality: N/A;  . LUMBAR LAMINECTOMY N/A 11/05/2014   Procedure: LEFT L3-4 MICRODISCECTOMY;  Surgeon: Jessy Oto, MD;  Location: [place];  Service: Orthopedics;  Laterality: N/A;  . LUMBAR LAMINECTOMY/DECOMPRESSION MICRODISCECTOMY N/A 02/12/2015   Procedure: RE-DO LEFT L3-4 MICRODISCECTOMY;  Surgeon: Jessy Oto, MD;  Location: [place];  Service: Orthopedics;  Laterality: N/A;  . NASAL SEPTUM SURGERY  1995  . TONSILLECTOMY    . UPPER GI ENDOSCOPY    . UVULOPALATOPHARYNGOPLASTY (UPPP)/TONSILLECTOMY/SEPTOPLASTY  1995   same time with septoplasty-ended up ICU almost trached    Home Medications:  Allergies as of 11/30/2017      Reactions   Asa [aspirin] Shortness Of Breath   Nsaids Other (See Comments)   Makes him bleed.   Tolmetin Other (See Comments)   Makes him bleed.      Medication List        Accurate as of 11/30/17 12:15 PM. Always use your most recent med list.          acetaminophen 500 MG tablet Commonly known as:  TYLENOL Take 500 mg by mouth every 8 (eight) hours as needed.   albuterol 108 (90 Base) MCG/ACT inhaler Commonly known as:  PROVENTIL HFA;VENTOLIN HFA Inhale into the lungs.   ALCORTIN A 1-2-1 % Gel Apply topically.   ANDROGEL PUMP 20.25 MG/ACT (1.62%) Gel Generic drug:  Testosterone AndroGel 20.25 mg/1.25 gram (1.62 %) transdermal gel pump   budesonide-formoterol 160-4.5 MCG/ACT inhaler Commonly known as:  SYMBICORT Inhale 2 puffs into the lungs 2 (two) times daily.   buPROPion 150 MG 24 hr tablet Commonly known as:  WELLBUTRIN XL bupropion HCl XL 150 mg 24 hr tablet, extended release   CINNAMON PO Take 4,000 mg by mouth daily at 2 PM.   clopidogrel 75 MG tablet Commonly known as:  PLAVIX Take 1 tablet (75 mg total) by mouth daily.   Co Q-10 100 MG Caps Take 1 tablet by mouth at bedtime.   donepezil 10 MG tablet Commonly known as:  ARICEPT Take 10  mg by mouth at bedtime.   dutasteride 0.5 MG capsule Commonly known as:  AVODART Take 1 capsule by mouth daily.   fluticasone 50 MCG/ACT nasal spray Commonly known as:  FLONASE Place 2 sprays into both nostrils  daily.   furosemide 20 MG tablet Commonly known as:  LASIX furosemide 20 mg tablet   hydrOXYzine 10 MG tablet Commonly known as:  ATARAX/VISTARIL Take 2 tablets by mouth at bedtime.   levothyroxine 50 MCG tablet Commonly known as:  SYNTHROID, LEVOTHROID Take 50 mcg by mouth daily. Take on an empty stomach with a glass of water at least 30-60 minutes before breakfast.   LINZESS 145 MCG Caps capsule Generic drug:  linaclotide Linzess 145 mcg capsule   LYRICA 50 MG capsule Generic drug:  pregabalin Lyrica 50 mg capsule   Magnesium Citrate 100 MG Tabs Take by mouth.   methylPREDNISolone 4 MG Tbpk tablet Commonly known as:  MEDROL DOSEPAK methylprednisolone 4 mg tablets in a dose pack   mirabegron ER 50 MG Tb24 tablet Commonly known as:  MYRBETRIQ Take 1 tablet (50 mg total) by mouth daily.   montelukast 10 MG tablet Commonly known as:  SINGULAIR montelukast 10 mg tablet   MULTI-VITAMINS Tabs Take by mouth.   oxyCODONE-acetaminophen 7.5-325 MG tablet Commonly known as:  PERCOCET Take 1 tablet by mouth every 8 (eight) hours as needed for severe pain.   polyethylene glycol packet Commonly known as:  MIRALAX / GLYCOLAX Take 17 g by mouth daily as needed. Reported on 01/21/2016   RA KRILL OIL 500 MG Caps Take by mouth.   solifenacin 10 MG tablet Commonly known as:  VESICARE Take 1 tablet (10 mg total) by mouth daily.   tamsulosin 0.4 MG Caps capsule Commonly known as:  FLOMAX Take 1 capsule by mouth daily.   tiotropium 18 MCG inhalation capsule Commonly known as:  SPIRIVA Place 1 capsule (18 mcg total) into inhaler and inhale daily.   topiramate 200 MG tablet Commonly known as:  TOPAMAX Take 1 tablet by mouth daily.   traMADol 50 MG  tablet Commonly known as:  ULTRAM Take 1 tablet (50 mg total) by mouth every 6 (six) hours as needed for moderate pain.   Vitamin D 1000 units capsule Take 3,000 Units by mouth daily.       Allergies:  Allergies  Allergen Reactions  . Asa [Aspirin] Shortness Of Breath  . Nsaids Other (See Comments)    Makes him bleed.  . Tolmetin Other (See Comments)    Makes him bleed.    Family History: Family History  Problem Relation Age of Onset  . Cancer Mother   . Heart attack Father   . Heart disease Unknown   . Arthritis Unknown   . Prostate cancer Neg Hx   . Kidney disease Neg Hx     Social History:  reports that he quit smoking about 21 years ago. His smoking use included cigarettes. He has a 60.00 pack-year smoking history. he has never used smokeless tobacco. He reports that he does not drink alcohol or use drugs.  ROS: UROLOGY Frequent Urination?: No Hard to postpone urination?: Yes Burning/pain with urination?: No Get up at night to urinate?: Yes Leakage of urine?: Yes Urine stream starts and stops?: Yes Trouble starting stream?: No Do you have to strain to urinate?: No Blood in urine?: No Urinary tract infection?: No Sexually transmitted disease?: No Injury to kidneys or bladder?: No Painful intercourse?: No Weak stream?: No Erection problems?: Yes Penile pain?: No  Gastrointestinal Nausea?: No Vomiting?: No Indigestion/heartburn?: No Diarrhea?: Yes Constipation?: Yes  Constitutional Fever: No Night sweats?: No Weight loss?: No Fatigue?: Yes  Skin Skin rash/lesions?: No Itching?: Yes  Eyes Blurred vision?: No Double vision?: No  Ears/Nose/Throat Sore throat?: No Sinus problems?: No  Hematologic/Lymphatic Swollen glands?: No Easy bruising?: No  Cardiovascular Leg swelling?: Yes Chest pain?: No  Respiratory Cough?: No Shortness of breath?: No  Endocrine Excessive thirst?: No  Musculoskeletal Back pain?: Yes Joint pain?:  Yes  Neurological Headaches?: No Dizziness?: No  Psychologic Depression?: No Anxiety?: No  Physical Exam: BP 116/71 (BP Location: Left Arm, Patient Position: Sitting, Cuff Size: Large)   Pulse 70   Ht 6' (1.829 m)   BMI 37.43 kg/m   Constitutional: Well nourished. Alert and oriented, No acute distress. HEENT: [place] AT, moist mucus membranes. Trachea midline, no masses. Cardiovascular: No clubbing, cyanosis, or edema. Respiratory: Normal respiratory effort, no increased work of breathing. Skin: No rashes, bruises or suspicious lesions. Lymph: No cervical or inguinal adenopathy. Neurologic: Grossly intact, no focal deficits, moving all 4 extremities. Psychiatric: Normal mood and affect.   Laboratory Data: Lab Results  Component Value Date   WBC 12.0 (H) 07/15/2017   HGB 13.8 07/15/2017   HCT 40.6 07/15/2017   MCV 91.9 07/15/2017   PLT 163 07/15/2017    Lab Results  Component Value Date   CREATININE 0.87 07/13/2017    PSA history:  Less than 0.1 ng/mL on 04/04/2014  Less than 0.1 ng/mL on 10/04/2014  0.3 ng/mL on 01/28/2015  0.1 ng/mL on 01/16/2016   Lab Results  Component Value Date   HGBA1C 5.7 (H) 05/31/2017   I have reviewed the labs   Assessment & Plan:    1. BPH with LUTS  - IPSS score is 30/6, it is significantly worse  - Continue conservative management, avoiding bladder irritants and timed voiding's  - most bothersome symptoms is/are incontinence  - discontinue the tamsulosin  - Continue dutasteride 0.5 mg daily  - RTC in 3 months for I PSS and PVR  2. Urinary incontinence  - PVR was 15 mL  - continue Vesicare 10 mg daily; restart Myrbetriq 50 mg daily and discontinue the tansulosin  - feels that his symptoms are improving now that he is getting medications consistently  - RTC in 3 months for I PSS and PVR   Return in about 3 months (around 02/28/2018) for IPSS and PVR .  Zara Council, [facility] [address], [place],  [address] [phone]

## 2017-11-30 ENCOUNTER — Encounter: Payer: Self-pay | Admitting: Urology

## 2017-11-30 ENCOUNTER — Ambulatory Visit (INDEPENDENT_AMBULATORY_CARE_PROVIDER_SITE_OTHER): Payer: Managed Care, Other (non HMO) | Admitting: Urology

## 2017-11-30 VITALS — BP 116/71 | HR 70 | Ht 72.0 in

## 2017-11-30 DIAGNOSIS — N401 Enlarged prostate with lower urinary tract symptoms: Secondary | ICD-10-CM

## 2017-11-30 DIAGNOSIS — N3942 Incontinence without sensory awareness: Secondary | ICD-10-CM | POA: Diagnosis not present

## 2017-11-30 DIAGNOSIS — N138 Other obstructive and reflux uropathy: Secondary | ICD-10-CM

## 2017-11-30 LAB — BLADDER SCAN AMB NON-IMAGING: Scan Result: 15

## 2017-11-30 MED ORDER — SOLIFENACIN SUCCINATE 10 MG PO TABS: 10.0000 mg | ORAL_TABLET | Freq: Every day | ORAL | 3 refills | Status: DC

## 2017-11-30 MED ORDER — MIRABEGRON ER 50 MG PO TB24: 50.0000 mg | ORAL_TABLET | Freq: Every day | ORAL | 3 refills | Status: DC

## 2017-11-30 NOTE — Patient Instructions (Addendum)
Stop the tamsulosin (Flomax), take mirabegron (Myrbetriq 50 mg) and solifenacin (Vesicare 10 mg) every day.    Try to use the rest room every two hours while awake.

## 2017-12-07 ENCOUNTER — Encounter (INDEPENDENT_AMBULATORY_CARE_PROVIDER_SITE_OTHER): Payer: Self-pay | Admitting: Specialist

## 2017-12-07 ENCOUNTER — Ambulatory Visit (INDEPENDENT_AMBULATORY_CARE_PROVIDER_SITE_OTHER): Payer: Managed Care, Other (non HMO) | Admitting: Specialist

## 2017-12-07 VITALS — BP 117/68 | HR 68 | Ht 72.0 in | Wt 262.0 lb

## 2017-12-07 DIAGNOSIS — M5136 Other intervertebral disc degeneration, lumbar region: Secondary | ICD-10-CM | POA: Diagnosis not present

## 2017-12-07 DIAGNOSIS — M961 Postlaminectomy syndrome, not elsewhere classified: Secondary | ICD-10-CM | POA: Diagnosis not present

## 2017-12-07 DIAGNOSIS — M170 Bilateral primary osteoarthritis of knee: Secondary | ICD-10-CM | POA: Diagnosis not present

## 2017-12-07 NOTE — Patient Instructions (Addendum)
Avoid frequent bending and stooping  No lifting greater than 10 lbs. May use ice or moist heat for pain. Weight loss is of benefit. Handicap license is approved.  Referral for consideration for a spinal cord stimulator.

## 2017-12-07 NOTE — Progress Notes (Signed)
Office Visit Note   Patient: Eddie Carter           Date of Birth: 1941/02/09           MRN: [id number] Visit Date: 12/07/2017              Requested by: [name], MD [address] [place],  [address] PCP: [name], MD   Assessment & Plan: Visit Diagnoses:  1. Degenerative disc disease, lumbar   2. Lumbar post-laminectomy syndrome     Plan: Avoid frequent bending and stooping  No lifting greater than 10 lbs. May use ice or moist heat for pain. Weight loss is of benefit. Handicap license is approved. Referral for consideration for a spinal cord stimulator. Follow-Up Instructions: Return in about 4 weeks (around 01/04/2018).   Orders:  No orders of the defined types were placed in this encounter.  No orders of the defined types were placed in this encounter.     Procedures: No procedures performed   Clinical Data: No additional findings.   Subjective: Chief Complaint  Patient presents with  . Lower Back - Follow-up    77 year old male with history of right shoulder pain and bilateral knee pain, he does not walk without being able to catch himself. He is taking gabapentin and experiencing some sedation. He reports That he spoke with Marylee Floras PA-c and he explained the spine fusion.     Review of Systems  Constitutional: Negative.   HENT: Negative.   Eyes: Negative.   Respiratory: Negative.   Cardiovascular: Negative.   Gastrointestinal: Negative.   Endocrine: Negative.   Genitourinary: Negative.   Musculoskeletal: Negative.   Skin: Negative.   Allergic/Immunologic: Negative.   Neurological: Negative.   Hematological: Negative.   Psychiatric/Behavioral: Negative.      Objective: Vital Signs: BP 117/68 (BP Location: Left Arm, Patient Position: Sitting)   Pulse 68   Ht 6' (1.829 m)   Wt 262 lb (118.8 kg)   BMI 35.53 kg/m   Physical Exam  Constitutional: He is oriented to person, place, and time. He appears well-developed  and well-nourished.  HENT:  Head: Normocephalic and atraumatic.  Eyes: EOM are normal. Pupils are equal, round, and reactive to light.  Neck: Normal range of motion. Neck supple.  Pulmonary/Chest: Effort normal and breath sounds normal.  Abdominal: Soft. Bowel sounds are normal.  Neurological: He is alert and oriented to person, place, and time.  Skin: Skin is warm and dry.  Psychiatric: He has a normal mood and affect. His behavior is normal. Judgment and thought content normal.    Back Exam   Tenderness  The patient is experiencing tenderness in the lumbar.  Range of Motion  Extension: abnormal  Flexion: abnormal  Lateral bend right: normal  Lateral bend left: normal  Rotation right: normal  Rotation left: normal   Muscle Strength  The patient has normal back strength. Right Quadriceps:  5/5  Left Quadriceps:  5/5  Right Hamstrings:  5/5  Left Hamstrings:  5/5   Tests  Straight leg raise right: negative Straight leg raise left: negative  Reflexes  Patellar: normal Achilles: normal Babinski's sign: normal   Other  Toe walk: normal Heel walk: normal Sensation: normal Gait: normal  Erythema: no back redness Scars: present      Specialty Comments:  No specialty comments available.  Imaging: No results found.   PMFS History: Patient Active Problem List   Diagnosis Date Noted  . Closed compression  fracture of L1 lumbar vertebra ([place]) 07/15/2017  . Back pain 07/15/2017  . Aspiration into airway 07/15/2017  . Wheezing 07/15/2017  . Leukocytosis 07/15/2017  . Bilateral calf pain 07/15/2017  . Low back pain 07/13/2017  . Cephalalgia 01/29/2016  . Adiposity 01/29/2016  . Hypertriglyceridemia 01/29/2016  . H/O respiratory system disease 01/29/2016  . H/O erectile dysfunction 01/29/2016  . Cerebrovascular accident, old 01/29/2016  . Diabetes mellitus ([place]) 01/29/2016  . Chronic obstructive pulmonary disease ([place]) 01/29/2016  . Benign fibroma of  prostate 01/29/2016  . Chronic pain 01/29/2016  . 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 Abnormal MRI, lumbar spine 01/29/2016  . Chronic low back pain (Location of Primary Source of Pain) (Bilateral) (L>R) 01/29/2016  . Chronic lower extremity pain (Location of Secondary source of pain) (Bilateral) (L>R) 01/29/2016  . Long term current use of opiate analgesic 01/29/2016  . Long term prescription opiate use 01/29/2016  . Opiate use 01/29/2016  . Encounter for therapeutic drug level monitoring 01/29/2016  . Encounter for pain management planning 01/29/2016  . Chronic lumbar radicular pain (Location of Secondary source of pain) (Bilateral) (L4 Dermatome) (L>R) 01/29/2016  . Chronic hip pain (Location of Tertiary source of pain) (Bilateral) (L>R) 01/29/2016  . Chronic knee pain (Bilateral) (L>R) 01/29/2016  . Chronic anticoagulation (Plavix) 01/29/2016  . Erectile dysfunction of organic origin 01/22/2016  . BPH with obstruction/lower urinary tract symptoms 07/24/2015  . Hypogonadism in male 07/24/2015  . HNP (herniated nucleus pulposus), lumbar 11/05/2014    Class: Acute  . Herniated nucleus pulposus, lumbar 11/05/2014  . Pulmonary nodules 10/25/2013  . ILD (interstitial lung disease) ([place]) 08/22/2013  . Traumatic tear of lateral meniscus of right knee   . Medial meniscus, posterior horn derangement   . COPD (chronic obstructive pulmonary disease) ([place])   . Shortness of breath   . Sleep apnea   . Stroke ([place])   . Arthritis   . GERD (gastroesophageal reflux disease)   . History of GI bleed   . HOH (hard of hearing)   . Full dentures   . Complication of anesthesia    Past Medical History:  Diagnosis Date  . Arthritis   . Asthma   . BPH (benign prostatic hyperplasia)   . 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 Complication of anesthesia    after septoplasty and uvulectomy-was in icu- [facility] - 10 yrs. ago  . COPD (chronic obstructive pulmonary disease) ([place])   .  Diabetes mellitus without complication ([place])   . ED (erectile dysfunction)   . Full dentures   . GERD (gastroesophageal reflux disease)    no meds  . Headache    difficulty with headaches- 1950's , again in 1967, 2008- again problems with migrraines   . History of GI bleed   . History of urethral stricture   . HOH (hard of hearing)   . Hypogonadism in male   . Joint pain   . Medial meniscus, posterior horn derangement    left knee  . Obesity   . Pneumonia 2014   [facility]  . Shortness of breath   . Skin cancer   . Sleep apnea    uses a cpap, most nights   . Stroke [facility]) 1995   some speech and thought processes slow  . Traumatic tear of lateral meniscus of right knee    right knee  . Urinary frequency   . Urinary incontinence     Family History  Problem Relation Age  of Onset  . Cancer Mother   . Heart attack Father   . Heart disease Unknown   . Arthritis Unknown   . Prostate cancer Neg Hx   . Kidney disease Neg Hx     Past Surgical History:  Procedure Laterality Date  . APPENDECTOMY  1963  . CARPAL TUNNEL RELEASE     left  . COLONOSCOPY    . COLONOSCOPY WITH PROPOFOL N/A 12/07/2016   Procedure: COLONOSCOPY WITH PROPOFOL;  Surgeon: Lollie Sails, MD;  Location: [facility] ENDOSCOPY;  Service: Endoscopy;  Laterality: N/A;  . EYE SURGERY     foreign body removed fr. eye, ? side   . FOOT FOREIGN BODY REMOVAL  1976   left foot -multiple pieces glass  . HEMORROIDECTOMY  1995  . KNEE ARTHROSCOPY Bilateral 01/17/2013   Procedure: ARTHROSCOPY KNEE BILATERAL WITH MEDIAL AND LATERAL MENISECTOMIES;  Surgeon: Lorn Junes, MD;  Location: [place];  Service: Orthopedics;  Laterality: Bilateral;  . KYPHOPLASTY N/A 07/23/2017   Procedure: KYPHOPLASTY- L1;  Surgeon: Hessie Knows, MD;  Location: [facility] ORS;  Service: Orthopedics;  Laterality: N/A;  . LUMBAR LAMINECTOMY N/A 11/05/2014   Procedure: LEFT L3-4 MICRODISCECTOMY;  Surgeon: Jessy Oto, MD;  Location: [place];   Service: Orthopedics;  Laterality: N/A;  . LUMBAR LAMINECTOMY/DECOMPRESSION MICRODISCECTOMY N/A 02/12/2015   Procedure: RE-DO LEFT L3-4 MICRODISCECTOMY;  Surgeon: Jessy Oto, MD;  Location: [place];  Service: Orthopedics;  Laterality: N/A;  . NASAL SEPTUM SURGERY  1995  . TONSILLECTOMY    . UPPER GI ENDOSCOPY    . UVULOPALATOPHARYNGOPLASTY (UPPP)/TONSILLECTOMY/SEPTOPLASTY  1995   same time with septoplasty-ended up ICU almost trached   Social History   Occupational History    Comment: register of deeds  Tobacco Use  . Smoking status: Former Smoker    Packs/day: 1.50    Years: 40.00    Pack years: 60.00    Types: Cigarettes    Last attempt to quit: 01/14/1996    Years since quitting: 21.9  . Smokeless tobacco: Never Used  Substance and Sexual Activity  . Alcohol use: No    Comment: wine in a month  . Drug use: No  . Sexual activity: No

## 2017-12-09 ENCOUNTER — Ambulatory Visit (INDEPENDENT_AMBULATORY_CARE_PROVIDER_SITE_OTHER): Payer: Managed Care, Other (non HMO) | Admitting: Specialist

## 2017-12-13 ENCOUNTER — Ambulatory Visit: Payer: Medicare Other | Admitting: Urology

## 2018-01-05 ENCOUNTER — Ambulatory Visit (INDEPENDENT_AMBULATORY_CARE_PROVIDER_SITE_OTHER): Payer: Managed Care, Other (non HMO) | Admitting: Physical Medicine and Rehabilitation

## 2018-01-05 ENCOUNTER — Encounter (INDEPENDENT_AMBULATORY_CARE_PROVIDER_SITE_OTHER): Payer: Self-pay | Admitting: Physical Medicine and Rehabilitation

## 2018-01-05 VITALS — BP 123/64 | HR 62 | Temp 97.5°F

## 2018-01-05 DIAGNOSIS — F1190 Opioid use, unspecified, uncomplicated: Secondary | ICD-10-CM | POA: Diagnosis not present

## 2018-01-05 DIAGNOSIS — M5136 Other intervertebral disc degeneration, lumbar region: Secondary | ICD-10-CM

## 2018-01-05 DIAGNOSIS — M5416 Radiculopathy, lumbar region: Secondary | ICD-10-CM | POA: Diagnosis not present

## 2018-01-05 DIAGNOSIS — G8929 Other chronic pain: Secondary | ICD-10-CM | POA: Diagnosis not present

## 2018-01-05 DIAGNOSIS — I639 Cerebral infarction, unspecified: Secondary | ICD-10-CM

## 2018-01-05 DIAGNOSIS — M5441 Lumbago with sciatica, right side: Secondary | ICD-10-CM | POA: Diagnosis not present

## 2018-01-05 DIAGNOSIS — M961 Postlaminectomy syndrome, not elsewhere classified: Secondary | ICD-10-CM

## 2018-01-05 NOTE — Progress Notes (Deleted)
Pt states sharp pain in left knee, right shoulder, and lower back. Pt states he hurt himself 3 years ago and has been hurting ever since. Pt states standing and sitting makes pain worse, laying flat on back makes pain better.

## 2018-01-06 ENCOUNTER — Encounter (INDEPENDENT_AMBULATORY_CARE_PROVIDER_SITE_OTHER): Payer: Self-pay | Admitting: Specialist

## 2018-01-06 ENCOUNTER — Institutional Professional Consult (permissible substitution) (INDEPENDENT_AMBULATORY_CARE_PROVIDER_SITE_OTHER): Payer: Self-pay | Admitting: Physical Medicine and Rehabilitation

## 2018-01-06 ENCOUNTER — Ambulatory Visit (INDEPENDENT_AMBULATORY_CARE_PROVIDER_SITE_OTHER): Payer: Managed Care, Other (non HMO) | Admitting: Specialist

## 2018-01-06 VITALS — BP 111/64 | HR 62 | Ht 72.0 in | Wt 262.0 lb

## 2018-01-06 DIAGNOSIS — M5416 Radiculopathy, lumbar region: Secondary | ICD-10-CM

## 2018-01-06 DIAGNOSIS — M961 Postlaminectomy syndrome, not elsewhere classified: Secondary | ICD-10-CM | POA: Diagnosis not present

## 2018-01-06 NOTE — Patient Instructions (Addendum)
Avoid frequent bending and stooping  No lifting greater than 10 lbs. May use ice or moist heat for pain. Weight loss is of benefit. Continue with evaluation for a spinal cord stimular.

## 2018-01-06 NOTE — Progress Notes (Signed)
Office Visit Note   Patient: Eddie Carter           Date of Birth: 1941-09-17           MRN: [id number] Visit Date: 01/06/2018              Requested by: [name], MD [address] [place], [place] [address] PCP: [name], MD   Assessment & Plan: Visit Diagnoses:  1. Lumbar post-laminectomy syndrome   2. Left lumbar radiculopathy     Plan: Avoid frequent bending and stooping  No lifting greater than 10 lbs. May use ice or moist heat for pain. Weight loss is of benefit.  Follow-Up Instructions: No Follow-up on file.   Orders:  No orders of the defined types were placed in this encounter.  No orders of the defined types were placed in this encounter.     Procedures: No procedures performed   Clinical Data: No additional findings.   Subjective: Chief Complaint  Patient presents with  . Lower Back - Follow-up    77 year old male with history of left L3-4 HNP with recurrent HNP. He has had prolong impairment associated with the last surgery. He experiences weakness in the left leg.  He also has pain associated with the left back and lower extremity. Has had initial evaluation for a spinal cord stimulator and will undergo psych testing and eval. His right shoulder is better.    Review of Systems  Constitutional: Negative.   HENT: Negative.   Eyes: Negative.   Respiratory: Negative.   Cardiovascular: Negative.   Gastrointestinal: Negative.   Endocrine: Negative.   Genitourinary: Negative.   Musculoskeletal: Negative.   Skin: Negative.   Allergic/Immunologic: Negative.   Neurological: Negative.   Hematological: Negative.   Psychiatric/Behavioral: Negative.      Objective: Vital Signs: BP 111/64 (BP Location: Left Arm, Patient Position: Sitting)   Pulse 62   Ht 6' (1.829 m)   Wt 262 lb (118.8 kg)   BMI 35.53 kg/m   Physical Exam  Constitutional: He is oriented to person, place, and time. He appears well-developed and well-nourished.    HENT:  Head: Normocephalic and atraumatic.  Eyes: EOM are normal. Pupils are equal, round, and reactive to light.  Neck: Normal range of motion. Neck supple.  Pulmonary/Chest: Effort normal and breath sounds normal.  Abdominal: Soft. Bowel sounds are normal.  Musculoskeletal: Normal range of motion.  Neurological: He is alert and oriented to person, place, and time.  Skin: Skin is warm and dry.  Psychiatric: He has a normal mood and affect. His behavior is normal. Judgment and thought content normal.    Ortho Exam  Specialty Comments:  No specialty comments available.  Imaging: No results found.   PMFS History: Patient Active Problem List   Diagnosis Date Noted  . Closed compression fracture of L1 lumbar vertebra ([place]) 07/15/2017  . Back pain 07/15/2017  . Aspiration into airway 07/15/2017  . Wheezing 07/15/2017  . Leukocytosis 07/15/2017  . Bilateral calf pain 07/15/2017  . Low back pain 07/13/2017  . Cephalalgia 01/29/2016  . Adiposity 01/29/2016  . Hypertriglyceridemia 01/29/2016  . H/O respiratory system disease 01/29/2016  . H/O erectile dysfunction 01/29/2016  . Cerebrovascular accident, old 01/29/2016  . Diabetes mellitus ([place]) 01/29/2016  . Chronic obstructive pulmonary disease ([place]) 01/29/2016  . Benign fibroma of prostate 01/29/2016  . Chronic pain 01/29/2016  . 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 Abnormal MRI, lumbar spine 01/29/2016  .  Chronic low back pain (Location of Primary Source of Pain) (Bilateral) (L>R) 01/29/2016  . Chronic lower extremity pain (Location of Secondary source of pain) (Bilateral) (L>R) 01/29/2016  . Long term current use of opiate analgesic 01/29/2016  . Long term prescription opiate use 01/29/2016  . Opiate use 01/29/2016  . Encounter for therapeutic drug level monitoring 01/29/2016  . Encounter for pain management planning 01/29/2016  . Chronic lumbar radicular pain (Location of Secondary source of pain) (Bilateral) (L4  Dermatome) (L>R) 01/29/2016  . Chronic hip pain (Location of Tertiary source of pain) (Bilateral) (L>R) 01/29/2016  . Chronic knee pain (Bilateral) (L>R) 01/29/2016  . Chronic anticoagulation (Plavix) 01/29/2016  . Erectile dysfunction of organic origin 01/22/2016  . BPH with obstruction/lower urinary tract symptoms 07/24/2015  . Hypogonadism in male 07/24/2015  . HNP (herniated nucleus pulposus), lumbar 11/05/2014    Class: Acute  . Herniated nucleus pulposus, lumbar 11/05/2014  . Pulmonary nodules 10/25/2013  . ILD (interstitial lung disease) ([place]) 08/22/2013  . Traumatic tear of lateral meniscus of right knee   . Medial meniscus, posterior horn derangement   . COPD (chronic obstructive pulmonary disease) ([place])   . Shortness of breath   . Sleep apnea   . Stroke ([place])   . Arthritis   . GERD (gastroesophageal reflux disease)   . History of GI bleed   . HOH (hard of hearing)   . Full dentures   . Complication of anesthesia    Past Medical History:  Diagnosis Date  . Arthritis   . Asthma   . BPH (benign prostatic hyperplasia)   . 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 Complication of anesthesia    after septoplasty and uvulectomy-was in icu- [facility] - 10 yrs. ago  . COPD (chronic obstructive pulmonary disease) ([place])   . Diabetes mellitus without complication ([place])   . ED (erectile dysfunction)   . Full dentures   . GERD (gastroesophageal reflux disease)    no meds  . Headache    difficulty with headaches- 1950's , again in 1967, 2008- again problems with migrraines   . History of GI bleed   . History of urethral stricture   . HOH (hard of hearing)   . Hypogonadism in male   . Joint pain   . Medial meniscus, posterior horn derangement    left knee  . Obesity   . Pneumonia 2014   [facility]  . Shortness of breath   . Skin cancer   . Sleep apnea    uses a cpap, most nights   . Stroke [facility]) 1995   some speech and thought processes slow  . Traumatic tear of lateral  meniscus of right knee    right knee  . Urinary frequency   . Urinary incontinence     Family History  Problem Relation Age of Onset  . Cancer Mother   . Heart attack Father   . Heart disease Unknown   . Arthritis Unknown   . Prostate cancer Neg Hx   . Kidney disease Neg Hx     Past Surgical History:  Procedure Laterality Date  . APPENDECTOMY  1963  . CARPAL TUNNEL RELEASE     left  . COLONOSCOPY    . COLONOSCOPY WITH PROPOFOL N/A 12/07/2016   Procedure: COLONOSCOPY WITH PROPOFOL;  Surgeon: Lollie Sails, MD;  Location: [facility] ENDOSCOPY;  Service: Endoscopy;  Laterality: N/A;  . EYE SURGERY     foreign body removed fr. eye, ?  side   . FOOT FOREIGN BODY REMOVAL  1976   left foot -multiple pieces glass  . HEMORROIDECTOMY  1995  . KNEE ARTHROSCOPY Bilateral 01/17/2013   Procedure: ARTHROSCOPY KNEE BILATERAL WITH MEDIAL AND LATERAL MENISECTOMIES;  Surgeon: Lorn Junes, MD;  Location: [place];  Service: Orthopedics;  Laterality: Bilateral;  . KYPHOPLASTY N/A 07/23/2017   Procedure: KYPHOPLASTY- L1;  Surgeon: Hessie Knows, MD;  Location: [facility] ORS;  Service: Orthopedics;  Laterality: N/A;  . LUMBAR LAMINECTOMY N/A 11/05/2014   Procedure: LEFT L3-4 MICRODISCECTOMY;  Surgeon: Jessy Oto, MD;  Location: [place];  Service: Orthopedics;  Laterality: N/A;  . LUMBAR LAMINECTOMY/DECOMPRESSION MICRODISCECTOMY N/A 02/12/2015   Procedure: RE-DO LEFT L3-4 MICRODISCECTOMY;  Surgeon: Jessy Oto, MD;  Location: [place];  Service: Orthopedics;  Laterality: N/A;  . NASAL SEPTUM SURGERY  1995  . TONSILLECTOMY    . UPPER GI ENDOSCOPY    . UVULOPALATOPHARYNGOPLASTY (UPPP)/TONSILLECTOMY/SEPTOPLASTY  1995   same time with septoplasty-ended up ICU almost trached   Social History   Occupational History    Comment: register of deeds  Tobacco Use  . Smoking status: Former Smoker    Packs/day: 1.50    Years: 40.00    Pack years: 60.00    Types: Cigarettes    Last attempt to  quit: 01/14/1996    Years since quitting: 21.9  . Smokeless tobacco: Never Used  Substance and Sexual Activity  . Alcohol use: No    Comment: wine in a month  . Drug use: No  . Sexual activity: No

## 2018-01-14 ENCOUNTER — Encounter (INDEPENDENT_AMBULATORY_CARE_PROVIDER_SITE_OTHER): Payer: Self-pay | Admitting: Physical Medicine and Rehabilitation

## 2018-01-14 NOTE — Progress Notes (Signed)
Eddie Carter - 77 y.o. male MRN [id number]  Date of birth: 1941/04/23  Office Visit Note: Visit Date: 01/05/2018 PCP: [name], MD Referred by: [name], MD  Subjective: Chief Complaint  Patient presents with  . Lower Back - Pain  . Right Knee - Pain  . Right Shoulder - Pain   HPI: A 77 year old gentleman who comes in today at the request of Dr. Louanne Skye for potential spinal cord stimulator trial.  Patient represents a very difficult case.  He reports an injury to his back and since that time he has had 2 lumbar surgeries as well as kyphoplasty at L1.  He continues to have issues with the disc and herniation as well as degenerative disc changes at L5-S1.  Today he is really perseverative over the events that caused his back injury cabinet or a shelf falling over and he did not have enough men to help him catch it.  He reports worsening low back pain to the point where he cannot stand for very long at all.  He is using a walker.  He reports a lot of issues with his upper body now from having to use the walker.  He reports a lot of knee pain.  He has had prior knee surgery.  His medical history is complicated with diabetes, COPD as well as interstitial lung disease.  He is morbidly obese.  He is on anticoagulation and has had a history of stroke.  Seen recently by a neurosurgeon for finding of his ventricular size.  Neurosurgeon noted L.  The neurosurgeon did not think he was a great candidate for any sort of shunting.  The neurosurgeon was Dr. Lysle Morales at [facility].  Patient is also had follow-up with various orthopedic practitioners at [facility] as well as Dr. Louanne Skye.  From a pain standpoint he really reports most of his pain being in the left knee and right shoulder right shoulder is obviously something different than where evaluating him for today.  He does report that standing and sitting makes the pain worse and laying flat makes it better.  He denies any tingling numbness or burning in the  feet.  Has had issues with his prostate but no other changes in bowel or bladder.  Dr. Louanne Skye is at a point where there is really nothing of the last resort.  The thought process was spinal cord stimulator trial may give him some pain relief.  The patient does take opioid medications.    Review of Systems  Constitutional: Negative for chills, fever, malaise/fatigue and weight loss.  HENT: Negative for hearing loss and sinus pain.   Eyes: Negative for blurred vision, double vision and photophobia.  Respiratory: Negative for cough and shortness of breath.   Cardiovascular: Negative for chest pain, palpitations and leg swelling.  Gastrointestinal: Negative for abdominal pain, nausea and vomiting.  Genitourinary: Negative for flank pain.  Musculoskeletal: Positive for back pain, falls and joint pain. Negative for myalgias.  Skin: Negative for itching and rash.  Neurological: Negative for tremors, focal weakness and weakness.  Endo/Heme/Allergies: Negative.   Psychiatric/Behavioral: Positive for memory loss. Negative for depression.  All other systems reviewed and are negative.  Otherwise per HPI.  Assessment & Plan: Visit Diagnoses:  1. Lumbar radiculopathy   2. Degenerative disc disease, lumbar   3. Chronic bilateral low back pain with right-sided sciatica   4. Post laminectomy syndrome   5. Opiate use   6. Cerebrovascular accident (CVA), unspecified mechanism ([place])  Plan: Findings:  Complicated medical patient is morbidly obese with difficult ambulatory status to to his size and impairment.  He has a history of 2 lumbar surgeries which were discectomies as well as kyphoplasty at L1.  He has continued mostly axial low back pain with also left knee pain.  He has some radicular symptoms to the buttock area.  He has no focal stenosis on MRI.  He really is not doing well with current medication regimen which is mainly opioid control.  He has had some therapy in the past but not recently.  He  is a poor historian which seems like he has a lot of memory issues and does perseverate over the incident to his back over 3 years ago.  I think at this point he does not represent a really great candidate for spinal cord stimulator however I did speak with Dr. Louanne Skye who he really wants to try to help the patient if he can.  I think the next step is to get him to see 1 of the neuropsychologists either Dr. Thayer Ohm or Dr. Vikki Ports for evaluation which is needed anyway for pre-spinal cord stimulator approval.  I do think they would be very beneficial in their opinion on his state neuropsychological standpoint stimulator recharging and using a remote control all the ramifications of having a device.  Medically the issues are his anticoagulation and prior history of stroke as well as his current diabetes and possible neuropathy.    Meds & Orders: No orders of the defined types were placed in this encounter.   Orders Placed This Encounter  Procedures  . Ambulatory referral to Neuropsychology    Follow-up: Return if symptoms worsen or fail to improve.   Procedures: No procedures performed  No notes on file   Clinical History: MRI LUMBAR SPINE WITHOUT CONTRAST  TECHNIQUE: Multiplanar, multisequence MR imaging of the lumbar spine was performed. No intravenous contrast was administered.  COMPARISON:  Lumbar spine radiographs 07/13/2017 and MRI 05/01/2016  FINDINGS: Segmentation:  Standard.  Alignment:  Normal.  Vertebrae: As seen on recent radiographs, there is a fracture through the L1 superior endplate with approximately 10% vertebral body height loss and mild marrow edema. Edema mildly extends into the right L1 pedicle. There is no retropulsion. Other vertebral body heights are preserved. A sclerotic focus in the L2 vertebral body is unchanged from the prior MRI. Moderate type 2 endplate changes are again seen at L5-S1.  Conus medullaris: Extends to the L1 level and appears  normal.  Paraspinal and other soft tissues: Postsurgical changes in the posterior lumbar soft tissues. No fluid collection.  Disc levels:  Disc desiccation throughout the lumbar spine.  T12-L1 and L1-2: Negative.  L2-3: Mild disc bulging, shallow right foraminal disc protrusion, and mild facet hypertrophy without stenosis, unchanged.  L3-4: Prior left laminectomy again noted. Mild disc bulging, a small left subarticular disc extrusion, and mild facet hypertrophy result in mild left lateral recess and mild bilateral neural foraminal stenosis. The disc protrusion has decreased in size with improved lateral recess patency and no clear L4 nerve root impingement. No spinal stenosis.  L4-5: Mild disc bulging and facet hypertrophy without significant stenosis, unchanged.  L5-S1: Chronic severe disc space narrowing. Circumferential disc bulging, endplate spurring, disc space height loss, and mild facet hypertrophy result in moderate right and mild left neural foraminal stenosis without spinal stenosis, unchanged.  IMPRESSION: 1. Recent L1 superior endplate fracture with mild height loss and marrow edema. 2. Decreased size of L3-4 disc  extrusion. 3. Unchanged neural foraminal stenosis at L3-4 and L5-S1.   Electronically Signed   By: Logan Bores M.D.   On: 07/14/2017 10:33  He reports that he quit smoking about 22 years ago. His smoking use included cigarettes. He has a 60.00 pack-year smoking history. he has never used smokeless tobacco.  Recent Labs    05/31/17 1641  HGBA1C 5.7*    Objective:  VS:  HT:    WT:   BMI:     BP:123/64  HR:62bpm  TEMP:(!) 97.5 F (36.4 C)(Oral)  RESP:97 % Physical Exam  Constitutional: He is oriented to person, place, and time. He appears well-developed and well-nourished. No distress.  Morbidly obese  HENT:  Head: Normocephalic and atraumatic.  Nose: Nose normal.  Mouth/Throat: Oropharynx is clear and moist.  Eyes:  Conjunctivae are normal. Pupils are equal, round, and reactive to light.  Neck: Normal range of motion. Neck supple. No tracheal deviation present.  Cardiovascular: Regular rhythm and intact distal pulses.  Pulmonary/Chest: Effort normal. No respiratory distress.  Abdominal: Soft. He exhibits no distension. There is no rebound and no guarding.  Musculoskeletal: He exhibits no deformity.  Patient ambulates with a very slow gait walker.  He is very slow to arise from a seated position.  Extension of the lumbar spine.  He has pain really with any motion of the hips but no groin pain.  Slump test.  He has good distal strength. He has no clonus.  Neurological: He is alert and oriented to person, place, and time. He displays abnormal reflex. He exhibits normal muscle tone.  Skin: Skin is warm. No rash noted.  Psychiatric: He has a normal mood and affect. His behavior is normal.  Nursing note and vitals reviewed.   Ortho Exam Imaging: No results found.  Past Medical/Family/Surgical/Social History: Medications & Allergies reviewed per EMR Patient Active Problem List   Diagnosis Date Noted  . Closed compression fracture of L1 lumbar vertebra ([place]) 07/15/2017  . Back pain 07/15/2017  . Aspiration into airway 07/15/2017  . Wheezing 07/15/2017  . Leukocytosis 07/15/2017  . Bilateral calf pain 07/15/2017  . Low back pain 07/13/2017  . Cephalalgia 01/29/2016  . Adiposity 01/29/2016  . Hypertriglyceridemia 01/29/2016  . H/O respiratory system disease 01/29/2016  . H/O erectile dysfunction 01/29/2016  . Cerebrovascular accident, old 01/29/2016  . Diabetes mellitus ([place]) 01/29/2016  . Chronic obstructive pulmonary disease ([place]) 01/29/2016  . Benign fibroma of prostate 01/29/2016  . Chronic pain 01/29/2016  . 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 Abnormal MRI, lumbar spine 01/29/2016  . Chronic low back pain (Location of Primary Source of Pain) (Bilateral) (L>R) 01/29/2016  . Chronic  lower extremity pain (Location of Secondary source of pain) (Bilateral) (L>R) 01/29/2016  . Long term current use of opiate analgesic 01/29/2016  . Long term prescription opiate use 01/29/2016  . Opiate use 01/29/2016  . Encounter for therapeutic drug level monitoring 01/29/2016  . Encounter for pain management planning 01/29/2016  . Chronic lumbar radicular pain (Location of Secondary source of pain) (Bilateral) (L4 Dermatome) (L>R) 01/29/2016  . Chronic hip pain (Location of Tertiary source of pain) (Bilateral) (L>R) 01/29/2016  . Chronic knee pain (Bilateral) (L>R) 01/29/2016  . Chronic anticoagulation (Plavix) 01/29/2016  . Erectile dysfunction of organic origin 01/22/2016  . BPH with obstruction/lower urinary tract symptoms 07/24/2015  . Hypogonadism in male 07/24/2015  . HNP (herniated nucleus pulposus), lumbar 11/05/2014    Class: Acute  . Herniated nucleus pulposus, lumbar 11/05/2014  .  Pulmonary nodules 10/25/2013  . ILD (interstitial lung disease) ([place]) 08/22/2013  . Traumatic tear of lateral meniscus of right knee   . Medial meniscus, posterior horn derangement   . COPD (chronic obstructive pulmonary disease) ([place])   . Shortness of breath   . Sleep apnea   . Stroke ([place])   . Arthritis   . GERD (gastroesophageal reflux disease)   . History of GI bleed   . HOH (hard of hearing)   . Full dentures   . Complication of anesthesia    Past Medical History:  Diagnosis Date  . Arthritis   . Asthma   . BPH (benign prostatic hyperplasia)   . 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 Complication of anesthesia    after septoplasty and uvulectomy-was in icu- [facility] - 10 yrs. ago  . COPD (chronic obstructive pulmonary disease) ([place])   . Diabetes mellitus without complication ([place])   . ED (erectile dysfunction)   . Full dentures   . GERD (gastroesophageal reflux disease)    no meds  . Headache    difficulty with headaches- 1950's , again in 1967, 2008- again problems  with migrraines   . History of GI bleed   . History of urethral stricture   . HOH (hard of hearing)   . Hypogonadism in male   . Joint pain   . Medial meniscus, posterior horn derangement    left knee  . Obesity   . Pneumonia 2014   [facility]  . Shortness of breath   . Skin cancer   . Sleep apnea    uses a cpap, most nights   . Stroke [facility]) 1995   some speech and thought processes slow  . Traumatic tear of lateral meniscus of right knee    right knee  . Urinary frequency   . Urinary incontinence    Family History  Problem Relation Age of Onset  . Cancer Mother   . Heart attack Father   . Heart disease Unknown   . Arthritis Unknown   . Prostate cancer Neg Hx   . Kidney disease Neg Hx    Past Surgical History:  Procedure Laterality Date  . APPENDECTOMY  1963  . CARPAL TUNNEL RELEASE     left  . COLONOSCOPY    . COLONOSCOPY WITH PROPOFOL N/A 12/07/2016   Procedure: COLONOSCOPY WITH PROPOFOL;  Surgeon: Lollie Sails, MD;  Location: [facility] ENDOSCOPY;  Service: Endoscopy;  Laterality: N/A;  . EYE SURGERY     foreign body removed fr. eye, ? side   . FOOT FOREIGN BODY REMOVAL  1976   left foot -multiple pieces glass  . HEMORROIDECTOMY  1995  . KNEE ARTHROSCOPY Bilateral 01/17/2013   Procedure: ARTHROSCOPY KNEE BILATERAL WITH MEDIAL AND LATERAL MENISECTOMIES;  Surgeon: Lorn Junes, MD;  Location: [place];  Service: Orthopedics;  Laterality: Bilateral;  . KYPHOPLASTY N/A 07/23/2017   Procedure: KYPHOPLASTY- L1;  Surgeon: Hessie Knows, MD;  Location: [facility] ORS;  Service: Orthopedics;  Laterality: N/A;  . LUMBAR LAMINECTOMY N/A 11/05/2014   Procedure: LEFT L3-4 MICRODISCECTOMY;  Surgeon: Jessy Oto, MD;  Location: [place];  Service: Orthopedics;  Laterality: N/A;  . LUMBAR LAMINECTOMY/DECOMPRESSION MICRODISCECTOMY N/A 02/12/2015   Procedure: RE-DO LEFT L3-4 MICRODISCECTOMY;  Surgeon: Jessy Oto, MD;  Location: [place];  Service: Orthopedics;  Laterality: N/A;   . NASAL SEPTUM SURGERY  1995  . TONSILLECTOMY    . UPPER GI ENDOSCOPY    .  UVULOPALATOPHARYNGOPLASTY (UPPP)/TONSILLECTOMY/SEPTOPLASTY  1995   same time with septoplasty-ended up ICU almost trached   Social History   Occupational History    Comment: register of deeds  Tobacco Use  . Smoking status: Former Smoker    Packs/day: 1.50    Years: 40.00    Pack years: 60.00    Types: Cigarettes    Last attempt to quit: 01/14/1996    Years since quitting: 22.0  . Smokeless tobacco: Never Used  Substance and Sexual Activity  . Alcohol use: No    Comment: wine in a month  . Drug use: No  . Sexual activity: No

## 2018-02-17 ENCOUNTER — Encounter (INDEPENDENT_AMBULATORY_CARE_PROVIDER_SITE_OTHER): Payer: Self-pay | Admitting: Specialist

## 2018-02-17 ENCOUNTER — Ambulatory Visit (INDEPENDENT_AMBULATORY_CARE_PROVIDER_SITE_OTHER): Payer: Managed Care, Other (non HMO) | Admitting: Specialist

## 2018-02-17 VITALS — BP 111/65 | HR 64 | Ht 72.0 in | Wt 262.0 lb

## 2018-02-17 DIAGNOSIS — M25561 Pain in right knee: Secondary | ICD-10-CM | POA: Diagnosis not present

## 2018-02-17 DIAGNOSIS — M25461 Effusion, right knee: Secondary | ICD-10-CM

## 2018-02-17 DIAGNOSIS — G8929 Other chronic pain: Secondary | ICD-10-CM | POA: Diagnosis not present

## 2018-02-17 NOTE — Progress Notes (Signed)
Office Visit Note   Patient: Eddie Carter           Date of Birth: 1941/11/09           MRN: [id number] Visit Date: 02/17/2018              Requested by: [name], MD [address] [place], [place] [address] PCP: [name], MD   Assessment & Plan: Visit Diagnoses:  1. Effusion, right knee   2. Chronic pain of right knee     Plan: The main ways of treat osteoarthritis, that are found to be success. Weight loss helps to decrease pain. Exercise is important to maintaining cartilage and thickness and strengthening. NSAIDs like tylenol are meds decreasing the inflamation. Ice is okay  In afternoon and evening and hot shower in the am  MRI of the right knee is ordered. to assess for a recurrent torn meniscus. CBD Oil four times per day.  Follow-Up Instructions: Return in about 3 weeks (around 03/10/2018).   Orders:  No orders of the defined types were placed in this encounter.  No orders of the defined types were placed in this encounter.     Procedures: No procedures performed   Clinical Data: No additional findings.   Subjective: Chief Complaint  Patient presents with  . Lower Back - Follow-up, Pain    77 year old male with history of left lumbar microdiscectomy x 2 at L3-4 with persistent right knee pain since a fall last year with right shoulder and right knee pain. The right shoulder is better and the back is improving. There is persistent pain in the right knee anterior and lateral with pain at full extension and full flexion. Some pain at night time. The pain in the right knee forces him to use his left leg the leg that has residual weakness post left sided disc herniations x 2.    Review of Systems  Constitutional: Negative.   HENT: Negative.   Eyes: Negative.   Respiratory: Negative.   Cardiovascular: Negative.   Gastrointestinal: Negative.   Endocrine: Negative.   Genitourinary: Negative.   Musculoskeletal: Negative.   Skin: Negative.     Allergic/Immunologic: Negative.   Neurological: Negative.   Hematological: Negative.   Psychiatric/Behavioral: Negative.      Objective: Vital Signs: BP 111/65   Pulse 64   Ht 6' (1.829 m)   Wt 262 lb (118.8 kg)   BMI 35.53 kg/m   Physical Exam  Constitutional: He is oriented to person, place, and time. He appears well-developed and well-nourished.  HENT:  Head: Normocephalic and atraumatic.  Eyes: Pupils are equal, round, and reactive to light. EOM are normal.  Neck: Normal range of motion. Neck supple.  Pulmonary/Chest: Effort normal and breath sounds normal.  Abdominal: Soft. Bowel sounds are normal.  Neurological: He is alert and oriented to person, place, and time.  Skin: Skin is warm and dry.  Psychiatric: He has a normal mood and affect. His behavior is normal. Judgment and thought content normal.    Right Knee Exam   Tenderness  The patient is experiencing tenderness in the lateral joint line.  Range of Motion  Extension:  -5 abnormal  Flexion:  120 abnormal   Tests  McMurray:  Medial - negative Lateral - positive Varus: negative Valgus: negative Lachman:  Anterior - negative    Posterior - negative Drawer:  Anterior - negative    Posterior - negative Pivot shift: negative Patellar apprehension: negative  Other  Erythema: absent Scars: absent Sensation: normal Pulse: present Swelling: mild      Specialty Comments:  No specialty comments available.  Imaging: No results found.   PMFS History: Patient Active Problem List   Diagnosis Date Noted  . Closed compression fracture of L1 lumbar vertebra ([place]) 07/15/2017  . Back pain 07/15/2017  . Aspiration into airway 07/15/2017  . Wheezing 07/15/2017  . Leukocytosis 07/15/2017  . Bilateral calf pain 07/15/2017  . Low back pain 07/13/2017  . Cephalalgia 01/29/2016  . Adiposity 01/29/2016  . Hypertriglyceridemia 01/29/2016  . H/O respiratory system disease 01/29/2016  . H/O erectile  dysfunction 01/29/2016  . Cerebrovascular accident, old 01/29/2016  . Diabetes mellitus ([place]) 01/29/2016  . Chronic obstructive pulmonary disease ([place]) 01/29/2016  . Benign fibroma of prostate 01/29/2016  . Chronic pain 01/29/2016  . 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 Abnormal MRI, lumbar spine 01/29/2016  . Chronic low back pain (Location of Primary Source of Pain) (Bilateral) (L>R) 01/29/2016  . Chronic lower extremity pain (Location of Secondary source of pain) (Bilateral) (L>R) 01/29/2016  . Long term current use of opiate analgesic 01/29/2016  . Long term prescription opiate use 01/29/2016  . Opiate use 01/29/2016  . Encounter for therapeutic drug level monitoring 01/29/2016  . Encounter for pain management planning 01/29/2016  . Chronic lumbar radicular pain (Location of Secondary source of pain) (Bilateral) (L4 Dermatome) (L>R) 01/29/2016  . Chronic hip pain (Location of Tertiary source of pain) (Bilateral) (L>R) 01/29/2016  . Chronic knee pain (Bilateral) (L>R) 01/29/2016  . Chronic anticoagulation (Plavix) 01/29/2016  . Erectile dysfunction of organic origin 01/22/2016  . BPH with obstruction/lower urinary tract symptoms 07/24/2015  . Hypogonadism in male 07/24/2015  . HNP (herniated nucleus pulposus), lumbar 11/05/2014    Class: Acute  . Herniated nucleus pulposus, lumbar 11/05/2014  . Pulmonary nodules 10/25/2013  . ILD (interstitial lung disease) ([place]) 08/22/2013  . Traumatic tear of lateral meniscus of right knee   . Medial meniscus, posterior horn derangement   . COPD (chronic obstructive pulmonary disease) ([place])   . Shortness of breath   . Sleep apnea   . Stroke ([place])   . Arthritis   . GERD (gastroesophageal reflux disease)   . History of GI bleed   . HOH (hard of hearing)   . Full dentures   . Complication of anesthesia    Past Medical History:  Diagnosis Date  . Arthritis   . Asthma   . BPH (benign prostatic hyperplasia)   . Cancer ([place])     face- basal cell  . Complication of anesthesia    after septoplasty and uvulectomy-was in icu- [facility] - 10 yrs. ago  . COPD (chronic obstructive pulmonary disease) ([place])   . Diabetes mellitus without complication ([place])   . ED (erectile dysfunction)   . Full dentures   . GERD (gastroesophageal reflux disease)    no meds  . Headache    difficulty with headaches- 1950's , again in 1967, 2008- again problems with migrraines   . History of GI bleed   . History of urethral stricture   . HOH (hard of hearing)   . Hypogonadism in male   . Joint pain   . Medial meniscus, posterior horn derangement    left knee  . Obesity   . Pneumonia 2014   [facility]  . Shortness of breath   . Skin cancer   . Sleep apnea    uses a cpap, most nights   . Stroke (  [place]) 1995   some speech and thought processes slow  . Traumatic tear of lateral meniscus of right knee    right knee  . Urinary frequency   . Urinary incontinence     Family History  Problem Relation Age of Onset  . Cancer Mother   . Heart attack Father   . Heart disease Unknown   . Arthritis Unknown   . Prostate cancer Neg Hx   . Kidney disease Neg Hx     Past Surgical History:  Procedure Laterality Date  . APPENDECTOMY  1963  . CARPAL TUNNEL RELEASE     left  . COLONOSCOPY    . COLONOSCOPY WITH PROPOFOL N/A 12/07/2016   Procedure: COLONOSCOPY WITH PROPOFOL;  Surgeon: Lollie Sails, MD;  Location[facility] ENDOSCOPY;  Service: Endoscopy;  Laterality: N/A;  . EYE SURGERY     foreign body removed fr. eye, ? side   . FOOT FOREIGN BODY REMOVAL  1976   left foot -multiple pieces glass  . HEMORROIDECTOMY  1995  . KNEE ARTHROSCOPY Bilateral 01/17/2013   Procedure: ARTHROSCOPY KNEE BILATERAL WITH MEDIAL AND LATERAL MENISECTOMIES;  Surgeon: Lorn Junes, MD;  Location: [place];  Service: Orthopedics;  Laterality: Bilateral;  . KYPHOPLASTY N/A 07/23/2017   Procedure: KYPHOPLASTY- L1;  Surgeon: Hessie Knows, MD;   Location: [facility] ORS;  Service: Orthopedics;  Laterality: N/A;  . LUMBAR LAMINECTOMY N/A 11/05/2014   Procedure: LEFT L3-4 MICRODISCECTOMY;  Surgeon: Jessy Oto, MD;  Location: [place];  Service: Orthopedics;  Laterality: N/A;  . LUMBAR LAMINECTOMY/DECOMPRESSION MICRODISCECTOMY N/A 02/12/2015   Procedure: RE-DO LEFT L3-4 MICRODISCECTOMY;  Surgeon: Jessy Oto, MD;  Location: [place];  Service: Orthopedics;  Laterality: N/A;  . NASAL SEPTUM SURGERY  1995  . TONSILLECTOMY    . UPPER GI ENDOSCOPY    . UVULOPALATOPHARYNGOPLASTY (UPPP)/TONSILLECTOMY/SEPTOPLASTY  1995   same time with septoplasty-ended up ICU almost trached   Social History   Occupational History    Comment: register of deeds  Tobacco Use  . Smoking status: Former Smoker    Packs/day: 1.50    Years: 40.00    Pack years: 60.00    Types: Cigarettes    Last attempt to quit: 01/14/1996    Years since quitting: 22.1  . Smokeless tobacco: Never Used  Substance and Sexual Activity  . Alcohol use: No    Comment: wine in a month  . Drug use: No  . Sexual activity: Never

## 2018-02-17 NOTE — Patient Instructions (Addendum)
The main ways of treat osteoarthritis, that are found to be success. Weight loss helps to decrease pain. Exercise is important to maintaining cartilage and thickness and strengthening. NSAIDs like tylenol are meds decreasing the inflamation. Ice is okay  In afternoon and evening and hot shower in the am  MRI of the right knee is ordered. to assess for a recurrent torn meniscus. CBD Oil four times per day.

## 2018-02-26 ENCOUNTER — Ambulatory Visit
Admission: RE | Admit: 2018-02-26 | Discharge: 2018-02-26 | Disposition: A | Discharge status: Home / Self Care | Payer: Managed Care, Other (non HMO) | Source: Ambulatory Visit | Attending: Specialist | Admitting: Specialist

## 2018-02-26 DIAGNOSIS — M25561 Pain in right knee: Principal | ICD-10-CM

## 2018-02-26 DIAGNOSIS — G8929 Other chronic pain: Secondary | ICD-10-CM

## 2018-02-28 NOTE — Progress Notes (Signed)
12:00 PM   Eddie Carter January 06, 1941 [id number]  Referring provider: [name], MD [place] [place], [place] [address]  No chief complaint on file.   HPI: Patient is a 77 year old Caucasian male with testosterone deficiency, erectile dysfunction and BPH with LUTS who presents today  for further discussion on his urinary symptoms with his caregiver, [name].    BPH with LUTS His IPSS score is 20, which is moderate lower urinary tract symptomatology. He is unhappy with his quality life due to his urinary symptoms.   His PVR is 0 mL.  His previous I PSS score was 30/6  His previous PVR was 15 mL.  His major complaint today is incontinence.  He has had these symptoms for over 10 years.  He denies any dysuria, hematuria or suprapubic pain.   He has been without Mybetriq for the last several weeks and his incontinence has worsened.  His has had an urethral stricture that had been dilated in the remote past.  He also denies any recent fevers, chills, nausea or vomiting.  He does not have a family history of PCa. IPSS    Row Name 03/01/18 1100         International Prostate Symptom Score   How often have you had the sensation of not emptying your bladder?  Not at All     How often have you had to urinate less than every two hours?  Not at All     How often have you found you stopped and started again several times when you urinated?  More than half the time     How often have you found it difficult to postpone urination?  Almost always     How often have you had a weak urinary stream?  Almost always     How often have you had to strain to start urination?  Almost always     How many times did you typically get up at night to urinate?  1 Time     Total IPSS Score  20       Quality of Life due to urinary symptoms   If you were to spend the rest of your life with your urinary condition just the way it is now how would you feel about that?  Unhappy        Score:  1-7 Mild 8-19  Moderate 20-35 Severe   PMH: Past Medical History:  Diagnosis Date  . Arthritis   . Asthma   . BPH (benign prostatic hyperplasia)   . 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 Complication of anesthesia    after septoplasty and uvulectomy-was in icu- [facility] - 10 yrs. ago  . COPD (chronic obstructive pulmonary disease) ([place])   . Diabetes mellitus without complication ([place])   . ED (erectile dysfunction)   . Full dentures   . GERD (gastroesophageal reflux disease)    no meds  . Headache    difficulty with headaches- 1950's , again in 1967, 2008- again problems with migrraines   . History of GI bleed   . History of urethral stricture   . HOH (hard of hearing)   . Hypogonadism in male   . Joint pain   . Medial meniscus, posterior horn derangement    left knee  . Obesity   . Pneumonia 2014   [facility]  . Shortness of breath   . Skin cancer   . Sleep apnea  uses a cpap, most nights   . Stroke [facility]) 1995   some speech and thought processes slow  . Traumatic tear of lateral meniscus of right knee    right knee  . Urinary frequency   . Urinary incontinence     Surgical History: Past Surgical History:  Procedure Laterality Date  . APPENDECTOMY  1963  . CARPAL TUNNEL RELEASE     left  . COLONOSCOPY    . COLONOSCOPY WITH PROPOFOL N/A 12/07/2016   Procedure: COLONOSCOPY WITH PROPOFOL;  Surgeon: Lollie Sails, MD;  Location: [facility] ENDOSCOPY;  Service: Endoscopy;  Laterality: N/A;  . EYE SURGERY     foreign body removed fr. eye, ? side   . FOOT FOREIGN BODY REMOVAL  1976   left foot -multiple pieces glass  . HEMORROIDECTOMY  1995  . KNEE ARTHROSCOPY Bilateral 01/17/2013   Procedure: ARTHROSCOPY KNEE BILATERAL WITH MEDIAL AND LATERAL MENISECTOMIES;  Surgeon: Lorn Junes, MD;  Location: [place];  Service: Orthopedics;  Laterality: Bilateral;  . KYPHOPLASTY N/A 07/23/2017   Procedure: KYPHOPLASTY- L1;  Surgeon: Hessie Knows, MD;  Location: [facility] ORS;   Service: Orthopedics;  Laterality: N/A;  . LUMBAR LAMINECTOMY N/A 11/05/2014   Procedure: LEFT L3-4 MICRODISCECTOMY;  Surgeon: Jessy Oto, MD;  Location: [place];  Service: Orthopedics;  Laterality: N/A;  . LUMBAR LAMINECTOMY/DECOMPRESSION MICRODISCECTOMY N/A 02/12/2015   Procedure: RE-DO LEFT L3-4 MICRODISCECTOMY;  Surgeon: Jessy Oto, MD;  Location: [place];  Service: Orthopedics;  Laterality: N/A;  . NASAL SEPTUM SURGERY  1995  . TONSILLECTOMY    . UPPER GI ENDOSCOPY    . UVULOPALATOPHARYNGOPLASTY (UPPP)/TONSILLECTOMY/SEPTOPLASTY  1995   same time with septoplasty-ended up ICU almost trached    Home Medications:  Allergies as of 03/01/2018      Reactions   Asa [aspirin] Shortness Of Breath   Nsaids Other (See Comments)   Makes him bleed.   Tolmetin Other (See Comments)   Makes him bleed.      Medication List        Accurate as of 03/01/18 12:00 PM. Always use your most recent med list.          acetaminophen 500 MG tablet Commonly known as:  TYLENOL Take 500 mg by mouth every 8 (eight) hours as needed.   albuterol 108 (90 Base) MCG/ACT inhaler Commonly known as:  PROVENTIL HFA;VENTOLIN HFA Inhale into the lungs.   ALCORTIN A 1-2-1 % Gel Apply topically.   ANDROGEL PUMP 20.25 MG/ACT (1.62%) Gel Generic drug:  Testosterone AndroGel 20.25 mg/1.25 gram (1.62 %) transdermal gel pump   budesonide-formoterol 160-4.5 MCG/ACT inhaler Commonly known as:  SYMBICORT Inhale 2 puffs into the lungs 2 (two) times daily.   buPROPion 150 MG 24 hr tablet Commonly known as:  WELLBUTRIN XL bupropion HCl XL 150 mg 24 hr tablet, extended release   CINNAMON PO Take 4,000 mg by mouth daily at 2 PM.   clopidogrel 75 MG tablet Commonly known as:  PLAVIX Take 1 tablet (75 mg total) by mouth daily.   Co Q-10 100 MG Caps Take 1 tablet by mouth at bedtime.   donepezil 10 MG tablet Commonly known as:  ARICEPT Take 10 mg by mouth at bedtime.   dutasteride 0.5 MG capsule Commonly  known as:  AVODART Take 1 capsule (0.5 mg total) by mouth daily.   fluticasone 50 MCG/ACT nasal spray Commonly known as:  FLONASE Place 2 sprays into both nostrils daily.   furosemide 20 MG  tablet Commonly known as:  LASIX furosemide 20 mg tablet   hydrOXYzine 10 MG tablet Commonly known as:  ATARAX/VISTARIL Take 2 tablets by mouth at bedtime.   levothyroxine 50 MCG tablet Commonly known as:  SYNTHROID, LEVOTHROID Take 50 mcg by mouth daily. Take on an empty stomach with a glass of water at least 30-60 minutes before breakfast.   LINZESS 145 MCG Caps capsule Generic drug:  linaclotide Linzess 145 mcg capsule   LYRICA 50 MG capsule Generic drug:  pregabalin Lyrica 50 mg capsule   Magnesium Citrate 100 MG Tabs Take by mouth.   methylPREDNISolone 4 MG Tbpk tablet Commonly known as:  MEDROL DOSEPAK methylprednisolone 4 mg tablets in a dose pack   mirabegron ER 50 MG Tb24 tablet Commonly known as:  MYRBETRIQ Take 1 tablet (50 mg total) by mouth daily.   montelukast 10 MG tablet Commonly known as:  SINGULAIR montelukast 10 mg tablet   MULTI-VITAMINS Tabs Take by mouth.   oxyCODONE-acetaminophen 7.5-325 MG tablet Commonly known as:  PERCOCET Take 1 tablet by mouth every 8 (eight) hours as needed for severe pain.   polyethylene glycol packet Commonly known as:  MIRALAX / GLYCOLAX Take 17 g by mouth daily as needed. Reported on 01/21/2016   RA KRILL OIL 500 MG Caps Take by mouth.   solifenacin 10 MG tablet Commonly known as:  VESICARE Take 1 tablet (10 mg total) by mouth daily.   tamsulosin 0.4 MG Caps capsule Commonly known as:  FLOMAX Take 1 capsule (0.4 mg total) by mouth daily.   tiotropium 18 MCG inhalation capsule Commonly known as:  SPIRIVA Place 1 capsule (18 mcg total) into inhaler and inhale daily.   topiramate 200 MG tablet Commonly known as:  TOPAMAX Take 1 tablet by mouth daily.   traMADol 50 MG tablet Commonly known as:  ULTRAM Take 1  tablet (50 mg total) by mouth every 6 (six) hours as needed for moderate pain.   Vitamin D 1000 units capsule Take 3,000 Units by mouth daily.       Allergies:  Allergies  Allergen Reactions  . Asa [Aspirin] Shortness Of Breath  . Nsaids Other (See Comments)    Makes him bleed.  . Tolmetin Other (See Comments)    Makes him bleed.    Family History: Family History  Problem Relation Age of Onset  . Cancer Mother   . Heart attack Father   . Heart disease Unknown   . Arthritis Unknown   . Prostate cancer Neg Hx   . Kidney disease Neg Hx     Social History:  reports that he quit smoking about 22 years ago. His smoking use included cigarettes. He has a 60.00 pack-year smoking history. He has never used smokeless tobacco. He reports that he does not drink alcohol or use drugs.  ROS: UROLOGY Frequent Urination?: No Hard to postpone urination?: No Burning/pain with urination?: No Get up at night to urinate?: Yes Leakage of urine?: No Urine stream starts and stops?: Yes Trouble starting stream?: Yes Do you have to strain to urinate?: No Blood in urine?: No Urinary tract infection?: No Sexually transmitted disease?: No Injury to kidneys or bladder?: No Painful intercourse?: No Weak stream?: Yes Erection problems?: No Penile pain?: No  Gastrointestinal Nausea?: No Vomiting?: No Indigestion/heartburn?: No Diarrhea?: Yes Constipation?: No  Constitutional Fever: No Night sweats?: No Weight loss?: No Fatigue?: Yes  Skin Skin rash/lesions?: No Itching?: Yes  Eyes Blurred vision?: No Double vision?: No  Ears/Nose/Throat Sore  throat?: No Sinus problems?: No  Hematologic/Lymphatic Swollen glands?: No Easy bruising?: No  Cardiovascular Leg swelling?: No Chest pain?: No  Respiratory Cough?: No Shortness of breath?: No  Endocrine Excessive thirst?: No  Musculoskeletal Back pain?: No Joint pain?: No  Neurological Headaches?: No Dizziness?:  No  Psychologic Depression?: No Anxiety?: No  Physical Exam: BP 112/65   Pulse 70   Resp 16   Ht 6' (1.829 m)   Wt 265 lb (120.2 kg)   SpO2 98%   BMI 35.94 kg/m   Constitutional: Well nourished. Alert and oriented, No acute distress. HEENT: [place] AT, moist mucus membranes. Trachea midline, no masses. Cardiovascular: No clubbing, cyanosis, or edema. Respiratory: Normal respiratory effort, no increased work of breathing. Skin: No rashes, bruises or suspicious lesions. Lymph: No cervical or inguinal adenopathy. Neurologic: Grossly intact, no focal deficits, moving all 4 extremities. Psychiatric: Normal mood and affect.   Laboratory Data: Lab Results  Component Value Date   WBC 12.0 (H) 07/15/2017   HGB 13.8 07/15/2017   HCT 40.6 07/15/2017   MCV 91.9 07/15/2017   PLT 163 07/15/2017    Lab Results  Component Value Date   CREATININE 0.87 07/13/2017    PSA history:  Less than 0.1 ng/mL on 04/04/2014  Less than 0.1 ng/mL on 10/04/2014  0.3 ng/mL on 01/28/2015  0.1 ng/mL on 01/16/2016   Lab Results  Component Value Date   HGBA1C 5.7 (H) 05/31/2017   I have reviewed the labs   Assessment & Plan:    1. BPH with LUTS  - IPSS score is 20/5, it is slightly improved  - Continue conservative management, avoiding bladder irritants and timed voiding's  - most bothersome symptoms is/are incontinence  - Continue dutasteride 0.5 mg daily; patient did continue the tamsulosin and would like a refill   - RTC in 12 months for I PSS and PVR  2. Urinary incontinence  - PVR was 0 mL  - Myrbetriq 50 mg daily   - feels that his symptoms are improving now that he is getting medications consistently  - RTC in 12 months for I PSS and PVR   Return in about 1 year (around 03/02/2019) for IPSS, PVR and exam.  Zara Council, [facility]  [place] [address] [place], [place] [address] [phone]

## 2018-03-01 ENCOUNTER — Ambulatory Visit (INDEPENDENT_AMBULATORY_CARE_PROVIDER_SITE_OTHER): Payer: Managed Care, Other (non HMO) | Admitting: Urology

## 2018-03-01 ENCOUNTER — Encounter: Payer: Self-pay | Admitting: Urology

## 2018-03-01 VITALS — BP 112/65 | HR 70 | Resp 16 | Ht 72.0 in | Wt 265.0 lb

## 2018-03-01 DIAGNOSIS — N3942 Incontinence without sensory awareness: Secondary | ICD-10-CM | POA: Diagnosis not present

## 2018-03-01 DIAGNOSIS — N401 Enlarged prostate with lower urinary tract symptoms: Secondary | ICD-10-CM | POA: Diagnosis not present

## 2018-03-01 DIAGNOSIS — N138 Other obstructive and reflux uropathy: Secondary | ICD-10-CM | POA: Diagnosis not present

## 2018-03-01 LAB — BLADDER SCAN AMB NON-IMAGING: Scan Result: 0

## 2018-03-01 MED ORDER — TAMSULOSIN HCL 0.4 MG PO CAPS: 0.4000 mg | ORAL_CAPSULE | Freq: Every day | ORAL | 3 refills | Status: DC

## 2018-03-01 MED ORDER — MIRABEGRON ER 50 MG PO TB24: 50.0000 mg | ORAL_TABLET | Freq: Every day | ORAL | 3 refills | Status: DC

## 2018-03-01 MED ORDER — DUTASTERIDE 0.5 MG PO CAPS: 0.5000 mg | ORAL_CAPSULE | Freq: Every day | ORAL | 3 refills | Status: DC

## 2018-03-15 ENCOUNTER — Ambulatory Visit
Admission: RE | Admit: 2018-03-15 | Discharge: 2018-03-15 | Disposition: A | Discharge status: Home / Self Care | Payer: Managed Care, Other (non HMO) | Source: Ambulatory Visit | Attending: Internal Medicine | Admitting: Internal Medicine

## 2018-03-15 ENCOUNTER — Other Ambulatory Visit: Payer: Self-pay | Admitting: Internal Medicine

## 2018-03-15 DIAGNOSIS — M25571 Pain in right ankle and joints of right foot: Secondary | ICD-10-CM | POA: Insufficient documentation

## 2018-03-15 DIAGNOSIS — M7989 Other specified soft tissue disorders: Secondary | ICD-10-CM | POA: Diagnosis not present

## 2018-03-15 DIAGNOSIS — R52 Pain, unspecified: Secondary | ICD-10-CM

## 2018-03-15 DIAGNOSIS — M79671 Pain in right foot: Secondary | ICD-10-CM | POA: Diagnosis present

## 2018-03-21 ENCOUNTER — Encounter (INDEPENDENT_AMBULATORY_CARE_PROVIDER_SITE_OTHER): Payer: Self-pay | Admitting: Specialist

## 2018-03-21 ENCOUNTER — Ambulatory Visit (INDEPENDENT_AMBULATORY_CARE_PROVIDER_SITE_OTHER): Payer: Managed Care, Other (non HMO) | Admitting: Specialist

## 2018-03-21 VITALS — BP 116/66 | HR 74 | Ht 72.0 in | Wt 262.0 lb

## 2018-03-21 DIAGNOSIS — M5136 Other intervertebral disc degeneration, lumbar region: Secondary | ICD-10-CM | POA: Diagnosis not present

## 2018-03-21 DIAGNOSIS — R29898 Other symptoms and signs involving the musculoskeletal system: Secondary | ICD-10-CM

## 2018-03-21 NOTE — Progress Notes (Signed)
Office Visit Note   Patient: Eddie Carter           Date of Birth: 1941/09/01           MRN: [id number] Visit Date: 03/21/2018              Requested by: [name], MD [address] [place], [place] [address] PCP: [name], MD   Assessment & Plan: Visit Diagnoses:  1. Degenerative disc disease, lumbar   2. Weakness of both legs     Plan: Avoid bending, stooping and avoid lifting weights greater than 10 lbs. Avoid prolong standing and walking. Avoid frequent bending and stooping  No lifting greater than 10 lbs. May use ice or moist heat for pain. Weight loss is of benefit. Handicap license is approved.  Follow-Up Instructions: Return in about 3 weeks (around 04/11/2018).   Orders:  No orders of the defined types were placed in this encounter.  No orders of the defined types were placed in this encounter.     Procedures: No procedures performed   Clinical Data: No additional findings.   Subjective: Chief Complaint  Patient presents with  . Right Knee - Follow-up    77 year old male with history of Lumbar DDD, post left L3-4 microdiscectomy twice for HNP and recurrence. He has had evaluation for a spinal cord stimulator and was not felt ot be a good candidate. He has significant depressive features and he reports he is scheduled to be seen in [place] for evaluation for stem cells. I am not sure if this is going to be of benefit.   Review of Systems  Constitutional: Negative.   HENT: Positive for congestion, rhinorrhea, sinus pressure and sinus pain.   Eyes: Negative.   Respiratory: Positive for shortness of breath and wheezing.   Cardiovascular: Negative.  Negative for chest pain.  Gastrointestinal: Negative.  Negative for abdominal distention, abdominal pain, anal bleeding, blood in stool, constipation, diarrhea, nausea and rectal pain.  Endocrine: Negative.  Negative for cold intolerance, heat intolerance, polydipsia, polyphagia and polyuria.    Genitourinary: Negative.  Negative for difficulty urinating, dysuria, enuresis, flank pain, frequency, genital sores and hematuria.  Musculoskeletal: Positive for back pain and gait problem. Negative for arthralgias, joint swelling, myalgias, neck pain and neck stiffness.  Skin: Negative.  Negative for color change, pallor, rash and wound.  Allergic/Immunologic: Negative.  Negative for environmental allergies.  Neurological: Negative for weakness and numbness.  Hematological: Negative.  Negative for adenopathy. Does not bruise/bleed easily.  Psychiatric/Behavioral: Negative.  Negative for agitation, behavioral problems, confusion, decreased concentration, dysphoric mood, hallucinations, self-injury, sleep disturbance and suicidal ideas. The patient is not nervous/anxious and is not hyperactive.      Objective: Vital Signs: BP 116/66 (BP Location: Left Arm, Patient Position: Sitting)   Pulse 74   Ht 6' (1.829 m)   Wt 262 lb (118.8 kg)   BMI 35.53 kg/m   Physical Exam  Constitutional: He is oriented to person, place, and time. He appears well-developed and well-nourished.  HENT:  Head: Normocephalic and atraumatic.  Eyes: Pupils are equal, round, and reactive to light. EOM are normal.  Neck: Normal range of motion. Neck supple.  Pulmonary/Chest: Effort normal and breath sounds normal.  Abdominal: Soft. Bowel sounds are normal.  Neurological: He is alert and oriented to person, place, and time.  Skin: Skin is warm and dry.  Psychiatric: He has a normal mood and affect. His behavior is normal. Judgment and thought content normal.  Back Exam   Tenderness  The patient is experiencing tenderness in the lumbar.  Range of Motion  Extension: abnormal  Flexion: abnormal  Lateral bend right: abnormal  Lateral bend left: abnormal  Rotation right: abnormal  Rotation left: abnormal   Muscle Strength  Right Quadriceps:  5/5  Left Quadriceps:  5/5  Right Hamstrings:  5/5  Left  Hamstrings:  5/5   Tests  Straight leg raise right: negative Straight leg raise left: negative  Reflexes  Patellar: normal Achilles: normal Babinski's sign: normal   Other  Toe walk: normal Heel walk: normal Sensation: normal Gait: normal  Scars: present      Specialty Comments:  No specialty comments available.  Imaging: No results found.   PMFS History: Patient Active Problem List   Diagnosis Date Noted  . Closed compression fracture of L1 lumbar vertebra 07/15/2017  . Back pain 07/15/2017  . Aspiration into airway 07/15/2017  . Wheezing 07/15/2017  . Leukocytosis 07/15/2017  . Bilateral calf pain 07/15/2017  . Low back pain 07/13/2017  . Cephalalgia 01/29/2016  . Adiposity 01/29/2016  . Hypertriglyceridemia 01/29/2016  . H/O respiratory system disease 01/29/2016  . H/O erectile dysfunction 01/29/2016  . Cerebrovascular accident, old 01/29/2016  . Diabetes mellitus ([place]) 01/29/2016  . Chronic obstructive pulmonary disease ([place]) 01/29/2016  . Benign fibroma of prostate 01/29/2016  . Chronic pain 01/29/2016  . 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 Abnormal MRI, lumbar spine 01/29/2016  . Chronic low back pain (Location of Primary Source of Pain) (Bilateral) (L>R) 01/29/2016  . Chronic lower extremity pain (Location of Secondary source of pain) (Bilateral) (L>R) 01/29/2016  . Long term current use of opiate analgesic 01/29/2016  . Long term prescription opiate use 01/29/2016  . Opiate use 01/29/2016  . Encounter for therapeutic drug level monitoring 01/29/2016  . Encounter for pain management planning 01/29/2016  . Chronic lumbar radicular pain (Location of Secondary source of pain) (Bilateral) (L4 Dermatome) (L>R) 01/29/2016  . Chronic hip pain (Location of Tertiary source of pain) (Bilateral) (L>R) 01/29/2016  . Chronic knee pain (Bilateral) (L>R) 01/29/2016  . Chronic anticoagulation (Plavix) 01/29/2016  . Erectile dysfunction of organic origin  01/22/2016  . BPH with obstruction/lower urinary tract symptoms 07/24/2015  . Hypogonadism in male 07/24/2015  . HNP (herniated nucleus pulposus), lumbar 11/05/2014    Class: Acute  . Herniated nucleus pulposus, lumbar 11/05/2014  . Pulmonary nodules 10/25/2013  . ILD (interstitial lung disease) ([place]) 08/22/2013  . Traumatic tear of lateral meniscus of right knee   . Medial meniscus, posterior horn derangement   . COPD (chronic obstructive pulmonary disease) ([place])   . Shortness of breath   . Sleep apnea   . Stroke ([place])   . Arthritis   . GERD (gastroesophageal reflux disease)   . History of GI bleed   . HOH (hard of hearing)   . Full dentures   . Complication of anesthesia    Past Medical History:  Diagnosis Date  . Arthritis   . Asthma   . BPH (benign prostatic hyperplasia)   . 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 Complication of anesthesia    after septoplasty and uvulectomy-was in icu- [facility] - 10 yrs. ago  . COPD (chronic obstructive pulmonary disease) ([place])   . Diabetes mellitus without complication ([place])   . ED (erectile dysfunction)   . Full dentures   . GERD (gastroesophageal reflux disease)    no meds  . Headache  difficulty with headaches- 1950's , again in 1967, 2008- again problems with migrraines   . History of GI bleed   . History of urethral stricture   . HOH (hard of hearing)   . Hypogonadism in male   . Joint pain   . Medial meniscus, posterior horn derangement    left knee  . Obesity   . Pneumonia 2014   [facility]  . Shortness of breath   . Skin cancer   . Sleep apnea    uses a cpap, most nights   . Stroke [facility]) 1995   some speech and thought processes slow  . Traumatic tear of lateral meniscus of right knee    right knee  . Urinary frequency   . Urinary incontinence     Family History  Problem Relation Age of Onset  . Cancer Mother   . Heart attack Father   . Heart disease Unknown   . Arthritis Unknown   . Prostate cancer Neg  Hx   . Kidney disease Neg Hx     Past Surgical History:  Procedure Laterality Date  . APPENDECTOMY  1963  . CARPAL TUNNEL RELEASE     left  . COLONOSCOPY    . COLONOSCOPY WITH PROPOFOL N/A 12/07/2016   Procedure: COLONOSCOPY WITH PROPOFOL;  Surgeon: Lollie Sails, MD;  Location[facility] ENDOSCOPY;  Service: Endoscopy;  Laterality: N/A;  . EYE SURGERY     foreign body removed fr. eye, ? side   . FOOT FOREIGN BODY REMOVAL  1976   left foot -multiple pieces glass  . HEMORROIDECTOMY  1995  . KNEE ARTHROSCOPY Bilateral 01/17/2013   Procedure: ARTHROSCOPY KNEE BILATERAL WITH MEDIAL AND LATERAL MENISECTOMIES;  Surgeon: Lorn Junes, MD;  Location: [place];  Service: Orthopedics;  Laterality: Bilateral;  . KYPHOPLASTY N/A 07/23/2017   Procedure: KYPHOPLASTY- L1;  Surgeon: Hessie Knows, MD;  Location: [facility] ORS;  Service: Orthopedics;  Laterality: N/A;  . LUMBAR LAMINECTOMY N/A 11/05/2014   Procedure: LEFT L3-4 MICRODISCECTOMY;  Surgeon: Jessy Oto, MD;  Location: [place];  Service: Orthopedics;  Laterality: N/A;  . LUMBAR LAMINECTOMY/DECOMPRESSION MICRODISCECTOMY N/A 02/12/2015   Procedure: RE-DO LEFT L3-4 MICRODISCECTOMY;  Surgeon: Jessy Oto, MD;  Location: [place];  Service: Orthopedics;  Laterality: N/A;  . NASAL SEPTUM SURGERY  1995  . TONSILLECTOMY    . UPPER GI ENDOSCOPY    . UVULOPALATOPHARYNGOPLASTY (UPPP)/TONSILLECTOMY/SEPTOPLASTY  1995   same time with septoplasty-ended up ICU almost trached   Social History   Occupational History    Comment: register of deeds  Tobacco Use  . Smoking status: Former Smoker    Packs/day: 1.50    Years: 40.00    Pack years: 60.00    Types: Cigarettes    Last attempt to quit: 01/14/1996    Years since quitting: 22.1  . Smokeless tobacco: Never Used  Substance and Sexual Activity  . Alcohol use: No    Comment: wine in a month  . Drug use: No  . Sexual activity: Never

## 2018-03-21 NOTE — Patient Instructions (Signed)
Avoid bending, stooping and avoid lifting weights greater than 10 lbs. Avoid prolong standing and walking. Avoid frequent bending and stooping  No lifting greater than 10 lbs. May use ice or moist heat for pain. Weight loss is of benefit. Handicap license is approved.  

## 2018-04-28 ENCOUNTER — Encounter (INDEPENDENT_AMBULATORY_CARE_PROVIDER_SITE_OTHER): Payer: Self-pay | Admitting: Specialist

## 2018-04-28 ENCOUNTER — Ambulatory Visit (INDEPENDENT_AMBULATORY_CARE_PROVIDER_SITE_OTHER): Payer: Managed Care, Other (non HMO) | Admitting: Specialist

## 2018-04-28 VITALS — BP 100/57 | HR 70 | Ht 72.0 in | Wt 262.0 lb

## 2018-04-28 DIAGNOSIS — F321 Major depressive disorder, single episode, moderate: Secondary | ICD-10-CM

## 2018-04-28 DIAGNOSIS — M5136 Other intervertebral disc degeneration, lumbar region: Secondary | ICD-10-CM

## 2018-04-28 DIAGNOSIS — M961 Postlaminectomy syndrome, not elsewhere classified: Secondary | ICD-10-CM | POA: Diagnosis not present

## 2018-04-28 NOTE — Addendum Note (Signed)
Addended by: Basil Dess on: 04/28/2018 11:39 AM   Modules accepted: Orders

## 2018-04-28 NOTE — Patient Instructions (Addendum)
Avoid frequent bending and stooping  No lifting greater than 10 lbs. May use ice or moist heat for pain. Weight loss is of benefit. Handicap license is approved. Probably should have evaluation by a therapist he trusts that can help him to work on improving his self esteem and his current level of self worth and find a way to help him with depression. I will speak with Dr. Lavera Guise about  A plan to address this.  An indwelling spinal cord stimulator is still a consideration.

## 2018-04-28 NOTE — Progress Notes (Addendum)
Office Visit Note   Patient: Eddie Carter           Date of Birth: 02-Mar-1941           MRN: [id number] Visit Date: 04/28/2018              Requested by: [name], MD [address] [place], [place] [address] PCP: [name], MD   Assessment & Plan: Visit Diagnoses:  1. Lumbar post-laminectomy syndrome   2. Degenerative disc disease, lumbar     Plan: Avoid frequent bending and stooping  No lifting greater than 10 lbs. May use ice or moist heat for pain. Weight loss is of benefit. Handicap license is approved. Probably should have evaluation by a therapist he trusts that can help him to work on improving his self esteem and his current level of self worth and find a way to help him with depression. I will speak with Dr. Lavera Guise about  A plan to address this.  An indwelling spinal cord stimulator is still a consideration.    Follow-Up Instructions: Return in about 3 months (around 07/29/2018).   Orders:  No orders of the defined types were placed in this encounter.  No orders of the defined types were placed in this encounter.     Procedures: No procedures performed   Clinical Data: No additional findings.   Subjective: Chief Complaint  Patient presents with  . Lower Back - Follow-up, Pain    77 year old male with history of left L3-4 HNP and recurrent HNP post initial and redo microdiscectomies 10/2014 and 01/2015. He has not bbeen able to return to his preinjury and predisc herniation status. He reports his balance is off and it is worse than 3 years ago. He feels like he has out lived his warranty. Most of his pain is in his back and he does have plenty in the left leg. He can not walk to the door from the motorized wheelchair in the office without a cane or walker. He still has trouble with his right shoulder being painful. I never dreamed I would live this long. I have rode horses others wouldn't ride. He has been evaluated by psychology Dr. 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    Review of Systems  Constitutional: Negative.   HENT: Negative.   Eyes: Negative.   Respiratory: Negative.   Cardiovascular: Negative.   Gastrointestinal: Positive for abdominal distention and abdominal pain.  Endocrine: Negative.   Genitourinary: Negative.   Musculoskeletal: Positive for back pain.  Skin: Negative.  Negative for color change, pallor, rash and wound.  Allergic/Immunologic: Negative.  Negative for environmental allergies, food allergies and immunocompromised state.  Neurological: Positive for weakness and numbness.  Hematological: Negative.  Negative for adenopathy. Does not bruise/bleed easily.  Psychiatric/Behavioral: Negative.  Negative for agitation, behavioral problems, confusion, decreased concentration, dysphoric mood, hallucinations, self-injury, sleep disturbance and suicidal ideas. The patient is not nervous/anxious and is not hyperactive.      Objective: Vital Signs: BP (!) 100/57 (BP Location: Left Arm, Patient Position: Sitting)   Pulse 70   Ht 6' (1.829 m)   Wt 262 lb (118.8 kg)   BMI 35.53 kg/m   Physical Exam  Constitutional: He is oriented to person, place, and time. He appears well-developed and well-nourished.  HENT:  Head: Normocephalic and atraumatic.  Eyes: Pupils are equal, round, and reactive  to light. EOM are normal.  Neck: Normal range of motion. Neck supple.  Pulmonary/Chest: Effort normal and breath sounds normal.  Abdominal: Soft. Bowel sounds are normal.  Neurological: He is alert and oriented to person, place, and time.  Skin: Skin is warm and dry.  Psychiatric: He has a normal mood and affect. His behavior is normal. Judgment and thought content normal.    Back Exam   Tenderness  The patient is experiencing tenderness in the lumbar.  Range of Motion  Extension: abnormal  Flexion: abnormal   Lateral bend right: abnormal  Lateral bend left: abnormal  Rotation right: abnormal  Rotation left: abnormal   Muscle Strength  Right Quadriceps:  5/5  Left Quadriceps:  4/5  Right Hamstrings:  5/5  Left Hamstrings:  5/5   Tests  Straight leg raise right: negative Straight leg raise left: negative  Reflexes  Patellar: abnormal Achilles: normal Biceps: normal Babinski's sign: normal   Other  Heel walk: normal Sensation: normal Gait: antalgic  Erythema: no back redness Scars: present  Comments:  Atrophy left calf 1 inch. Thigh circumference is symmetric.       Specialty Comments:  No specialty comments available.  Imaging: No results found.   PMFS History: Patient Active Problem List   Diagnosis Date Noted  . Closed compression fracture of L1 lumbar vertebra 07/15/2017  . Back pain 07/15/2017  . Aspiration into airway 07/15/2017  . Wheezing 07/15/2017  . Leukocytosis 07/15/2017  . Bilateral calf pain 07/15/2017  . Low back pain 07/13/2017  . Cephalalgia 01/29/2016  . Adiposity 01/29/2016  . Hypertriglyceridemia 01/29/2016  . H/O respiratory system disease 01/29/2016  . H/O erectile dysfunction 01/29/2016  . Cerebrovascular accident, old 01/29/2016  . Diabetes mellitus ([place]) 01/29/2016  . Chronic obstructive pulmonary disease ([place]) 01/29/2016  . Benign fibroma of prostate 01/29/2016  . Chronic pain 01/29/2016  . 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 Abnormal MRI, lumbar spine 01/29/2016  . Chronic low back pain (Location of Primary Source of Pain) (Bilateral) (L>R) 01/29/2016  . Chronic lower extremity pain (Location of Secondary source of pain) (Bilateral) (L>R) 01/29/2016  . Long term current use of opiate analgesic 01/29/2016  . Long term prescription opiate use 01/29/2016  . Opiate use 01/29/2016  . Encounter for therapeutic drug level monitoring 01/29/2016  . Encounter for pain management planning 01/29/2016  . Chronic lumbar radicular  pain (Location of Secondary source of pain) (Bilateral) (L4 Dermatome) (L>R) 01/29/2016  . Chronic hip pain (Location of Tertiary source of pain) (Bilateral) (L>R) 01/29/2016  . Chronic knee pain (Bilateral) (L>R) 01/29/2016  . Chronic anticoagulation (Plavix) 01/29/2016  . Erectile dysfunction of organic origin 01/22/2016  . BPH with obstruction/lower urinary tract symptoms 07/24/2015  . Hypogonadism in male 07/24/2015  . HNP (herniated nucleus pulposus), lumbar 11/05/2014    Class: Acute  . Herniated nucleus pulposus, lumbar 11/05/2014  . Pulmonary nodules 10/25/2013  . ILD (interstitial lung disease) ([place]) 08/22/2013  . Traumatic tear of lateral meniscus of right knee   . Medial meniscus, posterior horn derangement   . COPD (chronic obstructive pulmonary disease) ([place])   . Shortness of breath   . Sleep apnea   . Stroke ([place])   . Arthritis   . GERD (gastroesophageal reflux disease)   . History of GI bleed   . HOH (hard of hearing)   . Full dentures   . Complication of anesthesia    Past Medical History:  Diagnosis Date  . Arthritis   .  Asthma   . BPH (benign prostatic hyperplasia)   . 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 Complication of anesthesia    after septoplasty and uvulectomy-was in icu- [facility] - 10 yrs. ago  . COPD (chronic obstructive pulmonary disease) ([place])   . Diabetes mellitus without complication ([place])   . ED (erectile dysfunction)   . Full dentures   . GERD (gastroesophageal reflux disease)    no meds  . Headache    difficulty with headaches- 1950's , again in 1967, 2008- again problems with migrraines   . History of GI bleed   . History of urethral stricture   . HOH (hard of hearing)   . Hypogonadism in male   . Joint pain   . Medial meniscus, posterior horn derangement    left knee  . Obesity   . Pneumonia 2014   [facility]  . Shortness of breath   . Skin cancer   . Sleep apnea    uses a cpap, most nights   . Stroke [facility]) 1995   some  speech and thought processes slow  . Traumatic tear of lateral meniscus of right knee    right knee  . Urinary frequency   . Urinary incontinence     Family History  Problem Relation Age of Onset  . Cancer Mother   . Heart attack Father   . Heart disease Unknown   . Arthritis Unknown   . Prostate cancer Neg Hx   . Kidney disease Neg Hx     Past Surgical History:  Procedure Laterality Date  . APPENDECTOMY  1963  . CARPAL TUNNEL RELEASE     left  . COLONOSCOPY    . COLONOSCOPY WITH PROPOFOL N/A 12/07/2016   Procedure: COLONOSCOPY WITH PROPOFOL;  Surgeon: Lollie Sails, MD;  Location: [facility] ENDOSCOPY;  Service: Endoscopy;  Laterality: N/A;  . EYE SURGERY     foreign body removed fr. eye, ? side   . FOOT FOREIGN BODY REMOVAL  1976   left foot -multiple pieces glass  . HEMORROIDECTOMY  1995  . KNEE ARTHROSCOPY Bilateral 01/17/2013   Procedure: ARTHROSCOPY KNEE BILATERAL WITH MEDIAL AND LATERAL MENISECTOMIES;  Surgeon: Lorn Junes, MD;  Location: [place];  Service: Orthopedics;  Laterality: Bilateral;  . KYPHOPLASTY N/A 07/23/2017   Procedure: KYPHOPLASTY- L1;  Surgeon: Hessie Knows, MD;  Location: [facility] ORS;  Service: Orthopedics;  Laterality: N/A;  . LUMBAR LAMINECTOMY N/A 11/05/2014   Procedure: LEFT L3-4 MICRODISCECTOMY;  Surgeon: Jessy Oto, MD;  Location: [place];  Service: Orthopedics;  Laterality: N/A;  . LUMBAR LAMINECTOMY/DECOMPRESSION MICRODISCECTOMY N/A 02/12/2015   Procedure: RE-DO LEFT L3-4 MICRODISCECTOMY;  Surgeon: Jessy Oto, MD;  Location: [place];  Service: Orthopedics;  Laterality: N/A;  . NASAL SEPTUM SURGERY  1995  . TONSILLECTOMY    . UPPER GI ENDOSCOPY    . UVULOPALATOPHARYNGOPLASTY (UPPP)/TONSILLECTOMY/SEPTOPLASTY  1995   same time with septoplasty-ended up ICU almost trached   Social History   Occupational History    Comment: register of deeds  Tobacco Use  . Smoking status: Former Smoker    Packs/day: 1.50    Years: 40.00     Pack years: 60.00    Types: Cigarettes    Last attempt to quit: 01/14/1996    Years since quitting: 22.3  . Smokeless tobacco: Never Used  Substance and Sexual Activity  . Alcohol use: No    Comment: wine in a month  . Drug use:  No  . Sexual activity: Never

## 2018-05-05 ENCOUNTER — Encounter: Payer: Self-pay | Admitting: Urology

## 2018-05-05 ENCOUNTER — Ambulatory Visit (INDEPENDENT_AMBULATORY_CARE_PROVIDER_SITE_OTHER): Payer: Managed Care, Other (non HMO) | Admitting: Urology

## 2018-05-05 VITALS — BP 113/67 | HR 76

## 2018-05-05 DIAGNOSIS — N3942 Incontinence without sensory awareness: Secondary | ICD-10-CM | POA: Diagnosis not present

## 2018-05-05 DIAGNOSIS — Z87448 Personal history of other diseases of urinary system: Secondary | ICD-10-CM | POA: Diagnosis not present

## 2018-05-05 DIAGNOSIS — N138 Other obstructive and reflux uropathy: Secondary | ICD-10-CM | POA: Diagnosis not present

## 2018-05-05 DIAGNOSIS — N401 Enlarged prostate with lower urinary tract symptoms: Secondary | ICD-10-CM

## 2018-05-05 LAB — BLADDER SCAN AMB NON-IMAGING: SCAN RESULT: 6

## 2018-05-05 NOTE — Progress Notes (Signed)
3:29 PM   Eddie Carter [birth year] [id number]  Referring provider: [name], MD [place], [place] [address]  Chief Complaint  Patient presents with  . Benign Prostatic Hypertrophy    HPI: Patient is a 77 year old Caucasian male with testosterone deficiency, erectile dysfunction and BPH with LUTS who presents today  for further discussion on his urinary symptoms.    BPH with LUTS His IPSS score is 25, which is severe lower urinary tract symptomatology. He is terrible with his quality life due to his urinary symptoms.   His PVR is 6 mL.  His previous I PSS score was 20/5.  His previous PVR was 0 mL.  His major complaints today are frequency, urgency, nocturia x 1-2, incontinence with standing up and urgency, intermittency and hesitancy.  He is currently on dutasteride 0.5 mg, Myrbetriq 50 mg, tamsulosin 0.4 mg and Vesicare 10 mg. He states that his incontinence is "awful."  He states it has been increasingly worse over the last few weeks.  He denies any dysuria, hematuria or suprapubic pain.   His has had an urethral stricture that had been dilated in the remote past.  He also denies any recent fevers, chills, nausea or vomiting.  He does not have a family history of PCa. IPSS    Row Name 05/05/18 1500         International Prostate Symptom Score   How often have you had the sensation of not emptying your bladder?  More than half the time     How often have you had to urinate less than every two hours?  About half the time     How often have you found you stopped and started again several times when you urinated?  About half the time     How often have you found it difficult to postpone urination?  Almost always     How often have you had a weak urinary stream?  More than half the time     How often have you had to strain to start urination?  About half the time     How many times did you typically get up at night to urinate?  3 Times     Total IPSS Score  25         Quality of Life due to urinary symptoms   If you were to spend the rest of your life with your urinary condition just the way it is now how would you feel about that?  Mixed        Score:  1-7 Mild 8-19 Moderate 20-35 Severe   PMH: Past Medical History:  Diagnosis Date  . Arthritis   . Asthma   . BPH (benign prostatic hyperplasia)   . 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 Complication of anesthesia    after septoplasty and uvulectomy-was in icu- [facility] - 10 yrs. ago  . COPD (chronic obstructive pulmonary disease) ([place])   . Diabetes mellitus without complication ([place])   . ED (erectile dysfunction)   . Full dentures   . GERD (gastroesophageal reflux disease)    no meds  . Headache    difficulty with headaches- 1950's , again in 1967, 2008- again problems with migrraines   . History of GI bleed   . History of urethral stricture   . HOH (hard of hearing)   . Hypogonadism in male   . Joint pain   . Medial meniscus, posterior  horn derangement    left knee  . Obesity   . Pneumonia 2014   [facility]  . Shortness of breath   . Skin cancer   . Sleep apnea    uses a cpap, most nights   . Stroke [facility]) 1995   some speech and thought processes slow  . Traumatic tear of lateral meniscus of right knee    right knee  . Urinary frequency   . Urinary incontinence     Surgical History: Past Surgical History:  Procedure Laterality Date  . APPENDECTOMY  1963  . CARPAL TUNNEL RELEASE     left  . COLONOSCOPY    . COLONOSCOPY WITH PROPOFOL N/A 12/07/2016   Procedure: COLONOSCOPY WITH PROPOFOL;  Surgeon: Lollie Sails, MD;  Location: [facility] ENDOSCOPY;  Service: Endoscopy;  Laterality: N/A;  . EYE SURGERY     foreign body removed fr. eye, ? side   . FOOT FOREIGN BODY REMOVAL  1976   left foot -multiple pieces glass  . HEMORROIDECTOMY  1995  . KNEE ARTHROSCOPY Bilateral 01/17/2013   Procedure: ARTHROSCOPY KNEE BILATERAL WITH MEDIAL AND LATERAL MENISECTOMIES;  Surgeon:  Lorn Junes, MD;  Location: [place];  Service: Orthopedics;  Laterality: Bilateral;  . KYPHOPLASTY N/A 07/23/2017   Procedure: KYPHOPLASTY- L1;  Surgeon: Hessie Knows, MD;  Location: [facility] ORS;  Service: Orthopedics;  Laterality: N/A;  . LUMBAR LAMINECTOMY N/A 11/05/2014   Procedure: LEFT L3-4 MICRODISCECTOMY;  Surgeon: Jessy Oto, MD;  Location: [place];  Service: Orthopedics;  Laterality: N/A;  . LUMBAR LAMINECTOMY/DECOMPRESSION MICRODISCECTOMY N/A 02/12/2015   Procedure: RE-DO LEFT L3-4 MICRODISCECTOMY;  Surgeon: Jessy Oto, MD;  Location: [place];  Service: Orthopedics;  Laterality: N/A;  . NASAL SEPTUM SURGERY  1995  . TONSILLECTOMY    . UPPER GI ENDOSCOPY    . UVULOPALATOPHARYNGOPLASTY (UPPP)/TONSILLECTOMY/SEPTOPLASTY  1995   same time with septoplasty-ended up ICU almost trached    Home Medications:  Allergies as of 05/05/2018      Reactions   Asa [aspirin] Shortness Of Breath   Nsaids Other (See Comments)   Makes him bleed.   Tolmetin Other (See Comments)   Makes him bleed.      Medication List        Accurate as of 05/05/18  3:29 PM. Always use your most recent med list.          acetaminophen 500 MG tablet Commonly known as:  TYLENOL Take 500 mg by mouth every 8 (eight) hours as needed.   albuterol 108 (90 Base) MCG/ACT inhaler Commonly known as:  PROVENTIL HFA;VENTOLIN HFA Inhale into the lungs.   ALCORTIN A 1-2-1 % Gel Apply topically.   ANDROGEL PUMP 20.25 MG/ACT (1.62%) Gel Generic drug:  Testosterone AndroGel 20.25 mg/1.25 gram (1.62 %) transdermal gel pump   budesonide-formoterol 160-4.5 MCG/ACT inhaler Commonly known as:  SYMBICORT Inhale 2 puffs into the lungs 2 (two) times daily.   buPROPion 150 MG 24 hr tablet Commonly known as:  WELLBUTRIN XL bupropion HCl XL 150 mg 24 hr tablet, extended release   CINNAMON PO Take 4,000 mg by mouth daily at 2 PM.   clopidogrel 75 MG tablet Commonly known as:  PLAVIX Take 1 tablet  (75 mg total) by mouth daily.   Co Q-10 100 MG Caps Take 1 tablet by mouth at bedtime.   donepezil 10 MG tablet Commonly known as:  ARICEPT Take 10 mg by mouth at bedtime.   dutasteride 0.5 MG capsule Commonly  known as:  AVODART Take 1 capsule (0.5 mg total) by mouth daily.   fluticasone 50 MCG/ACT nasal spray Commonly known as:  FLONASE Place 2 sprays into both nostrils daily.   furosemide 20 MG tablet Commonly known as:  LASIX furosemide 20 mg tablet   hydrOXYzine 10 MG tablet Commonly known as:  ATARAX/VISTARIL Take 2 tablets by mouth at bedtime.   levothyroxine 50 MCG tablet Commonly known as:  SYNTHROID, LEVOTHROID Take 50 mcg by mouth daily. Take on an empty stomach with a glass of water at least 30-60 minutes before breakfast.   LINZESS 145 MCG Caps capsule Generic drug:  linaclotide Linzess 145 mcg capsule   LYRICA 50 MG capsule Generic drug:  pregabalin Lyrica 50 mg capsule   Magnesium Citrate 100 MG Tabs Take by mouth.   methylPREDNISolone 4 MG Tbpk tablet Commonly known as:  MEDROL DOSEPAK methylprednisolone 4 mg tablets in a dose pack   mirabegron ER 50 MG Tb24 tablet Commonly known as:  MYRBETRIQ Take 1 tablet (50 mg total) by mouth daily.   montelukast 10 MG tablet Commonly known as:  SINGULAIR montelukast 10 mg tablet   MULTI-VITAMINS Tabs Take by mouth.   oxyCODONE-acetaminophen 7.5-325 MG tablet Commonly known as:  PERCOCET Take 1 tablet by mouth every 8 (eight) hours as needed for severe pain.   polyethylene glycol packet Commonly known as:  MIRALAX / GLYCOLAX Take 17 g by mouth daily as needed. Reported on 01/21/2016   RA KRILL OIL 500 MG Caps Take by mouth.   solifenacin 10 MG tablet Commonly known as:  VESICARE Take 1 tablet (10 mg total) by mouth daily.   tamsulosin 0.4 MG Caps capsule Commonly known as:  FLOMAX Take 1 capsule (0.4 mg total) by mouth daily.   tiotropium 18 MCG inhalation capsule Commonly known as:   SPIRIVA Place 1 capsule (18 mcg total) into inhaler and inhale daily.   topiramate 200 MG tablet Commonly known as:  TOPAMAX Take 1 tablet by mouth daily.   traMADol 50 MG tablet Commonly known as:  ULTRAM Take 1 tablet (50 mg total) by mouth every 6 (six) hours as needed for moderate pain.   UNABLE TO FIND Med Name: Focus Factor   Vitamin D 1000 units capsule Take 3,000 Units by mouth daily.       Allergies:  Allergies  Allergen Reactions  . Asa [Aspirin] Shortness Of Breath  . Nsaids Other (See Comments)    Makes him bleed.  . Tolmetin Other (See Comments)    Makes him bleed.    Family History: Family History  Problem Relation Age of Onset  . Cancer Mother   . Heart attack Father   . Heart disease Unknown   . Arthritis Unknown   . Prostate cancer Neg Hx   . Kidney disease Neg Hx     Social History:  reports that he quit smoking about 22 years ago. His smoking use included cigarettes. He has a 60.00 pack-year smoking history. He has never used smokeless tobacco. He reports that he does not drink alcohol or use drugs.  ROS: UROLOGY Frequent Urination?: Yes Hard to postpone urination?: Yes Burning/pain with urination?: No Get up at night to urinate?: Yes Leakage of urine?: Yes Urine stream starts and stops?: No Trouble starting stream?: No Do you have to strain to urinate?: No Blood in urine?: No Urinary tract infection?: No Sexually transmitted disease?: No Injury to kidneys or bladder?: No Painful intercourse?: No Weak stream?: No Erection problems?:  Yes Penile pain?: No  Gastrointestinal Nausea?: No Vomiting?: No Indigestion/heartburn?: No Diarrhea?: Yes Constipation?: Yes  Constitutional Fever: No Night sweats?: No Weight loss?: No Fatigue?: No  Skin Skin rash/lesions?: Yes Itching?: Yes  Eyes Blurred vision?: Yes Double vision?: No  Ears/Nose/Throat Sore throat?: No Sinus problems?: No  Hematologic/Lymphatic Swollen glands?:  No Easy bruising?: No  Cardiovascular Leg swelling?: No Chest pain?: No  Respiratory Cough?: Yes Shortness of breath?: Yes  Endocrine Excessive thirst?: No  Musculoskeletal Back pain?: Yes Joint pain?: Yes  Neurological Headaches?: No Dizziness?: No  Psychologic Depression?: No Anxiety?: No  Physical Exam: BP 113/67 (BP Location: Left Arm, Patient Position: Sitting, Cuff Size: Large)   Pulse 76   Constitutional: Well nourished. Alert and oriented, No acute distress. HEENT: [place] AT, moist mucus membranes. Trachea midline, no masses. Cardiovascular: No clubbing, cyanosis, or edema. Respiratory: Normal respiratory effort, no increased work of breathing. GI: Abdomen is soft, non tender, non distended, no abdominal masses. Liver and spleen not palpable.  No hernias appreciated.  Stool sample for occult testing is not indicated.   GU: No CVA tenderness.  No bladder fullness or masses.  Patient with uncircumcised phallus.  Foreskin easily retracted Urethral meatus is patent.  No penile discharge. No penile lesions or rashes. Scrotum without lesions, cysts, rashes and/or edema.  Testicles are located scrotally bilaterally. No masses are appreciated in the testicles. Left and right epididymis are normal. Rectal: Patient with  normal sphincter tone. Anus and perineum without scarring or rashes. No rectal masses are appreciated. Prostate is approximately 45 grams, no nodules are appreciated. Seminal vesicles are normal. Skin: No rashes, bruises or suspicious lesions. Lymph: No cervical or inguinal adenopathy. Neurologic: Grossly intact, no focal deficits, moving all 4 extremities. Psychiatric: Normal mood and affect.  Laboratory Data: Lab Results  Component Value Date   WBC 12.0 (H) 07/15/2017   HGB 13.8 07/15/2017   HCT 40.6 07/15/2017   MCV 91.9 07/15/2017   PLT 163 07/15/2017    Lab Results  Component Value Date   CREATININE 0.87 07/13/2017    PSA history:  Less than  0.1 ng/mL on 04/04/2014  Less than 0.1 ng/mL on 10/04/2014  0.3 ng/mL on 01/28/2015  0.1 ng/mL on 01/16/2016   Lab Results  Component Value Date   HGBA1C 5.7 (H) 05/31/2017   I have reviewed the labs   Assessment & Plan:    1. BPH with LUTS  - IPSS score is 25/6, it is slightly worsened  - Continue conservative management, avoiding bladder irritants and timed voiding's  - most bothersome symptoms is/are incontinence  - patient is on maximal medical therapy incontinence is worsening  -Patient has a past history of urethral strictures and the need to be dilated in the past, we will schedule cystoscopy to evaluate for the recurrence of strictures  - I have explained to the patient that they will  be scheduled for a cystoscopy in our office to evaluate their bladder.  The cystoscopy consists of passing a tube with a lens up through their urethra and into their urinary bladder.   We will inject the urethra with a lidocaine gel prior to introducing the cystoscope to help with any discomfort during the procedure.   After the procedure, they might experience blood in the urine and discomfort with urination.  This will abate after the first few voids.  I have  encouraged the patient to increase water intake  during this time.  Patient denies any allergies to lidocaine.  2. History of urethral strictures Had undergone in office dilations in the past with Dr. Madelin Headings  3. Urinary incontinence PVR was 6 mL Worsening on Myrbetriq 50 mg daily and Vesicare 10 mg  Cystoscopy pending  Return for cystoscopy to evaluate for strictures .  Zara Council, PA-C  [facility] [address] [place], [place] [address] [phone]

## 2018-05-05 NOTE — Patient Instructions (Signed)
Cystoscopy  Cystoscopy is a procedure that is used to help diagnose and sometimes treat conditions that affect that lower urinary tract. The lower urinary tract includes the bladder and the tube that drains urine from the bladder out of the body (urethra). Cystoscopy is performed with a thin, tube-shaped instrument with a light and camera at the end (cystoscope). The cystoscope may be hard (rigid) or flexible, depending on the goal of the procedure.The cystoscope is inserted through the urethra, into the bladder.  Cystoscopy may be recommended if you have:   Urinary tractinfections that keep coming back (recurring).   Blood in the urine (hematuria).   Loss of bladder control (urinary incontinence) or an overactive bladder.   Unusual cells found in a urine sample.   A blockage in the urethra.   Painful urination.   An abnormality in the bladder found during an intravenous pyelogram (IVP) or CT scan.    Cystoscopy may also be done to remove a sample of tissue to be examined under a microscope (biopsy).  Tell a health care provider about:   Any allergies you have.   All medicines you are taking, including vitamins, herbs, eye drops, creams, and over-the-counter medicines.   Any problems you or family members have had with anesthetic medicines.   Any blood disorders you have.   Any surgeries you have had.   Any medical conditions you have.   Whether you are pregnant or may be pregnant.  What are the risks?  Generally, this is a safe procedure. However, problems may occur, including:   Infection.   Bleeding.   Allergic reactions to medicines.   Damage to other structures or organs.    What happens before the procedure?   Ask your health care provider about:  ? Changing or stopping your regular medicines. This is especially important if you are taking diabetes medicines or blood thinners.  ? Taking medicines such as aspirin and ibuprofen. These medicines can thin your blood. Do not take these medicines  before your procedure if your health care provider instructs you not to.   Follow instructions from your health care provider about eating or drinking restrictions.   You may be given antibiotic medicine to help prevent infection.   You may have an exam or testing, such as X-rays of the bladder, urethra, or kidneys.   You may have urine tests to check for signs of infection.   Plan to have someone take you home after the procedure.  What happens during the procedure?   To reduce your risk of infection,your health care team will wash or sanitize their hands.   You will be given one or more of the following:  ? A medicine to help you relax (sedative).  ? A medicine to numb the area (local anesthetic).   The area around the opening of your urethra will be cleaned.   The cystoscope will be passed through your urethra into your bladder.   Germ-free (sterile)fluid will flow through the cystoscope to fill your bladder. The fluid will stretch your bladder so that your surgeon can clearly examine your bladder walls.   The cystoscope will be removed and your bladder will be emptied.  The procedure may vary among health care providers and hospitals.  What happens after the procedure?   You may have some soreness or pain in your abdomen and urethra. Medicines will be available to help you.   You may have some blood in your urine.   Do not   drive for 24 hours if you received a sedative.  This information is not intended to replace advice given to you by your health care provider. Make sure you discuss any questions you have with your health care provider.  Document Released: 11/06/2000 Document Revised: 03/19/2016 Document Reviewed: 09/26/2015  Elsevier Interactive Patient Education  2018 [employer]

## 2018-05-09 ENCOUNTER — Ambulatory Visit (INDEPENDENT_AMBULATORY_CARE_PROVIDER_SITE_OTHER): Payer: Managed Care, Other (non HMO) | Admitting: Urology

## 2018-05-09 DIAGNOSIS — Z87448 Personal history of other diseases of urinary system: Secondary | ICD-10-CM

## 2018-05-09 DIAGNOSIS — N3946 Mixed incontinence: Secondary | ICD-10-CM | POA: Diagnosis not present

## 2018-05-09 MED ORDER — LIDOCAINE HCL URETHRAL/MUCOSAL 2 % EX GEL: 1.0000 "application " | Freq: Once | CUTANEOUS | Status: DC

## 2018-05-09 MED ORDER — CIPROFLOXACIN HCL 500 MG PO TABS: 500.0000 mg | ORAL_TABLET | Freq: Once | ORAL | Status: DC

## 2018-05-09 NOTE — Progress Notes (Addendum)
05/09/2018 3:34 PM   Theora Gianotti 1941-09-01 [id number]  Referring provider: [name], MD [place], [place] [address]  Chief Complaint  Patient presents with  . Cysto    HPI: [place]:  His IPSS score is 25, which is severe lower urinary tract symptomatology. His PVR is 6 mL.  His previous I PSS score was 20/5.  His previous PVR was 0 mL.  His major complaints today are frequency, urgency, nocturia x 1-2, incontinence with standing up and urgency, intermittency and hesitancy.  He is currently on dutasteride 0.5 mg, Myrbetriq 50 mg, tamsulosin 0.4 mg and Vesicare 10 mg. He states that his incontinence is "awful."  His has had an urethral stricture that had been dilated in the remote past.  Today Patient still is on multimodality therapy.  He still has urgency incontinence and he can leak with almost no warning.  He dates it back to an injury 3 years ago.  He said 3 back surgeries.  He is in a wheelchair.  He has minimal nocturia and frequency.  Cystoscopy: After verbal and written consent patient underwent flexible cystoscopy.  The penile bulbar urethra were normal.  He had minimal stricture disease but the scope easily passed.  He had mild bilobar enlargement of the prostate.  He had some lengthening of the prostatic urethra.  He a grade 1 of 4 bladder trabeculation.  He had no cystitis.      PMH: Past Medical History:  Diagnosis Date  . Arthritis   . Asthma   . BPH (benign prostatic hyperplasia)   . 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 Complication of anesthesia    after septoplasty and uvulectomy-was in icu- [facility] - 10 yrs. ago  . COPD (chronic obstructive pulmonary disease) ([place])   . Diabetes mellitus without complication ([place])   . ED (erectile dysfunction)   . Full dentures   . GERD (gastroesophageal reflux disease)    no meds  . Headache    difficulty with headaches- 1950's , again in 1967, 2008- again problems with migrraines   .  History of GI bleed   . History of urethral stricture   . HOH (hard of hearing)   . Hypogonadism in male   . Joint pain   . Medial meniscus, posterior horn derangement    left knee  . Obesity   . Pneumonia 2014   [facility]  . Shortness of breath   . Skin cancer   . Sleep apnea    uses a cpap, most nights   . Stroke [facility]) 1995   some speech and thought processes slow  . Traumatic tear of lateral meniscus of right knee    right knee  . Urinary frequency   . Urinary incontinence     Surgical History: Past Surgical History:  Procedure Laterality Date  . APPENDECTOMY  1963  . CARPAL TUNNEL RELEASE     left  . COLONOSCOPY    . COLONOSCOPY WITH PROPOFOL N/A 12/07/2016   Procedure: COLONOSCOPY WITH PROPOFOL;  Surgeon: Lollie Sails, MD;  Location: [facility] ENDOSCOPY;  Service: Endoscopy;  Laterality: N/A;  . EYE SURGERY     foreign body removed fr. eye, ? side   . FOOT FOREIGN BODY REMOVAL  1976   left foot -multiple pieces glass  . HEMORROIDECTOMY  1995  . KNEE ARTHROSCOPY Bilateral 01/17/2013   Procedure: ARTHROSCOPY KNEE BILATERAL WITH MEDIAL AND LATERAL MENISECTOMIES;  Surgeon: Lorn Junes, MD;  Location: [place];  Service: Orthopedics;  Laterality: Bilateral;  . KYPHOPLASTY N/A 07/23/2017   Procedure: KYPHOPLASTY- L1;  Surgeon: Hessie Knows, MD;  Location: [facility] ORS;  Service: Orthopedics;  Laterality: N/A;  . LUMBAR LAMINECTOMY N/A 11/05/2014   Procedure: LEFT L3-4 MICRODISCECTOMY;  Surgeon: Jessy Oto, MD;  Location: [place];  Service: Orthopedics;  Laterality: N/A;  . LUMBAR LAMINECTOMY/DECOMPRESSION MICRODISCECTOMY N/A 02/12/2015   Procedure: RE-DO LEFT L3-4 MICRODISCECTOMY;  Surgeon: Jessy Oto, MD;  Location: [place];  Service: Orthopedics;  Laterality: N/A;  . NASAL SEPTUM SURGERY  1995  . TONSILLECTOMY    . UPPER GI ENDOSCOPY    . UVULOPALATOPHARYNGOPLASTY (UPPP)/TONSILLECTOMY/SEPTOPLASTY  1995   same time with septoplasty-ended up ICU almost  trached    Home Medications:  Allergies as of 05/09/2018      Reactions   Asa [aspirin] Shortness Of Breath   Nsaids Other (See Comments)   Makes him bleed.   Tolmetin Other (See Comments)   Makes him bleed.      Medication List        Accurate as of 05/09/18  3:34 PM. Always use your most recent med list.          acetaminophen 500 MG tablet Commonly known as:  TYLENOL Take 500 mg by mouth every 8 (eight) hours as needed.   albuterol 108 (90 Base) MCG/ACT inhaler Commonly known as:  PROVENTIL HFA;VENTOLIN HFA Inhale into the lungs.   ALCORTIN A 1-2-1 % Gel Apply topically.   ANDROGEL PUMP 20.25 MG/ACT (1.62%) Gel Generic drug:  Testosterone AndroGel 20.25 mg/1.25 gram (1.62 %) transdermal gel pump   budesonide-formoterol 160-4.5 MCG/ACT inhaler Commonly known as:  SYMBICORT Inhale 2 puffs into the lungs 2 (two) times daily.   buPROPion 150 MG 24 hr tablet Commonly known as:  WELLBUTRIN XL bupropion HCl XL 150 mg 24 hr tablet, extended release   CINNAMON PO Take 4,000 mg by mouth daily at 2 PM.   clopidogrel 75 MG tablet Commonly known as:  PLAVIX Take 1 tablet (75 mg total) by mouth daily.   Co Q-10 100 MG Caps Take 1 tablet by mouth at bedtime.   donepezil 10 MG tablet Commonly known as:  ARICEPT Take 10 mg by mouth at bedtime.   dutasteride 0.5 MG capsule Commonly known as:  AVODART Take 1 capsule (0.5 mg total) by mouth daily.   fluticasone 50 MCG/ACT nasal spray Commonly known as:  FLONASE Place 2 sprays into both nostrils daily.   furosemide 20 MG tablet Commonly known as:  LASIX furosemide 20 mg tablet   hydrOXYzine 10 MG tablet Commonly known as:  ATARAX/VISTARIL Take 2 tablets by mouth at bedtime.   levothyroxine 50 MCG tablet Commonly known as:  SYNTHROID, LEVOTHROID Take 50 mcg by mouth daily. Take on an empty stomach with a glass of water at least 30-60 minutes before breakfast.   LINZESS 145 MCG Caps capsule Generic drug:   linaclotide Linzess 145 mcg capsule   LYRICA 50 MG capsule Generic drug:  pregabalin Lyrica 50 mg capsule   Magnesium Citrate 100 MG Tabs Take by mouth.   methylPREDNISolone 4 MG Tbpk tablet Commonly known as:  MEDROL DOSEPAK methylprednisolone 4 mg tablets in a dose pack   mirabegron ER 50 MG Tb24 tablet Commonly known as:  MYRBETRIQ Take 1 tablet (50 mg total) by mouth daily.   montelukast 10 MG tablet Commonly known as:  SINGULAIR montelukast 10 mg tablet   MULTI-VITAMINS Tabs Take  by mouth.   oxyCODONE-acetaminophen 7.5-325 MG tablet Commonly known as:  PERCOCET Take 1 tablet by mouth every 8 (eight) hours as needed for severe pain.   polyethylene glycol packet Commonly known as:  MIRALAX / GLYCOLAX Take 17 g by mouth daily as needed. Reported on 01/21/2016   RA KRILL OIL 500 MG Caps Take by mouth.   solifenacin 10 MG tablet Commonly known as:  VESICARE Take 1 tablet (10 mg total) by mouth daily.   tamsulosin 0.4 MG Caps capsule Commonly known as:  FLOMAX Take 1 capsule (0.4 mg total) by mouth daily.   tiotropium 18 MCG inhalation capsule Commonly known as:  SPIRIVA Place 1 capsule (18 mcg total) into inhaler and inhale daily.   topiramate 200 MG tablet Commonly known as:  TOPAMAX Take 1 tablet by mouth daily.   traMADol 50 MG tablet Commonly known as:  ULTRAM Take 1 tablet (50 mg total) by mouth every 6 (six) hours as needed for moderate pain.   UNABLE TO FIND Med Name: Focus Factor   Vitamin D 1000 units capsule Take 3,000 Units by mouth daily.       Allergies:  Allergies  Allergen Reactions  . Asa [Aspirin] Shortness Of Breath  . Nsaids Other (See Comments)    Makes him bleed.  . Tolmetin Other (See Comments)    Makes him bleed.    Family History: Family History  Problem Relation Age of Onset  . Cancer Mother   . Heart attack Father   . Heart disease Unknown   . Arthritis Unknown   . Prostate cancer Neg Hx   . Kidney disease Neg  Hx     Social History:  reports that he quit smoking about 22 years ago. His smoking use included cigarettes. He has a 60.00 pack-year smoking history. He has never used smokeless tobacco. He reports that he does not drink alcohol or use drugs.  ROS:                                        Physical Exam: There were no vitals taken for this visit.  Constitutional:  Alert and oriented, No acute distress.   Laboratory Data: Lab Results  Component Value Date   WBC 12.0 (H) 07/15/2017   HGB 13.8 07/15/2017   HCT 40.6 07/15/2017   MCV 91.9 07/15/2017   PLT 163 07/15/2017    Lab Results  Component Value Date   CREATININE 0.87 07/13/2017    No results found for: PSA  Lab Results  Component Value Date   TESTOSTERONE 194 (L) 01/16/2016    Lab Results  Component Value Date   HGBA1C 5.7 (H) 05/31/2017    Urinalysis    Component Value Date/Time   COLORURINE YELLOW (A) 07/15/2017 0436   APPEARANCEUR CLEAR (A) 07/15/2017 0436   APPEARANCEUR Clear 07/25/2013 1646   LABSPEC 1.015 07/15/2017 0436   LABSPEC 1.012 07/25/2013 1646   PHURINE 6.0 07/15/2017 0436   GLUCOSEU NEGATIVE 07/15/2017 0436   GLUCOSEU Negative 07/25/2013 1646   HGBUR NEGATIVE 07/15/2017 0436   BILIRUBINUR NEGATIVE 07/15/2017 0436   BILIRUBINUR Negative 07/25/2013 1646   KETONESUR NEGATIVE 07/15/2017 0436   PROTEINUR NEGATIVE 07/15/2017 0436   NITRITE NEGATIVE 07/15/2017 0436   LEUKOCYTESUR NEGATIVE 07/15/2017 0436   LEUKOCYTESUR Negative 07/25/2013 1646    Pertinent Imaging:   Assessment & Plan: 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  He does not have a stricture.  The role of Toviaz consider the Vesicare was discussed.  I do not think he would be a good candidate for InterStim or Botox.  Percutaneous tibial nerve stimulation is an option.  Does need MRIs he does have a functional component.  I do not think urodynamics will change any treatment  options  .Vesicare.  Add Toviaz 8 mg to polypharmacy and reassess 5 weeks possible percutaneous tibial nerve stimulation  1. History of urethral stricture  - Urinalysis, Complete - ciprofloxacin (CIPRO) tablet 500 mg - lidocaine (XYLOCAINE) 2 % jelly 1 application   No follow-ups on file.  Reece Packer, MD  [facility] [address], [place], [place] [address] [phone]

## 2018-05-10 LAB — URINALYSIS, COMPLETE
BILIRUBIN UA: NEGATIVE
GLUCOSE, UA: NEGATIVE
KETONES UA: NEGATIVE
LEUKOCYTES UA: NEGATIVE
Nitrite, UA: NEGATIVE
Protein, UA: NEGATIVE
RBC UA: NEGATIVE
Specific Gravity, UA: 1.01 (ref 1.005–1.030)
Urobilinogen, Ur: 0.2 mg/dL (ref 0.2–1.0)
pH, UA: 5 (ref 5.0–7.5)

## 2018-06-01 ENCOUNTER — Encounter (INDEPENDENT_AMBULATORY_CARE_PROVIDER_SITE_OTHER): Payer: Self-pay | Admitting: Physical Medicine and Rehabilitation

## 2018-06-01 ENCOUNTER — Ambulatory Visit (INDEPENDENT_AMBULATORY_CARE_PROVIDER_SITE_OTHER): Payer: Managed Care, Other (non HMO) | Admitting: Physical Medicine and Rehabilitation

## 2018-06-01 DIAGNOSIS — R202 Paresthesia of skin: Secondary | ICD-10-CM

## 2018-06-01 DIAGNOSIS — M5416 Radiculopathy, lumbar region: Secondary | ICD-10-CM

## 2018-06-01 NOTE — Progress Notes (Signed)
.  Numeric Pain Rating Scale and Functional Assessment Average Pain 6   In the last MONTH (on 0-10 scale) has pain interfered with the following?  1. General activity like being  able to carry out your everyday physical activities such as walking, climbing stairs, carrying groceries, or moving a chair?  Rating(5)   

## 2018-06-02 NOTE — Progress Notes (Signed)
Eddie Carter - 77 y.o. male MRN [id number]  Date of birth: [birth year]  Office Visit Note: Visit Date: 06/01/2018 PCP: Eddie Athens, MD Referred by: Eddie Athens, MD  Subjective: Chief Complaint  Patient presents with  . Left Leg - Pain   HPI: Eddie Carter is a 77 year old gentleman that comes in today at the request of Dr. Basil Carter for left lower extremity electrodiagnostic study.  Dr. Louanne Skye wanted to assess the severity of the L4 radicular problem that the patient has had.  He is status post left L3-4 hemilaminectomy and discectomy.  He has had reherniation.  Eddie Carter however tells me that his right leg is the most problem.  He feels like the pain is mostly in his knees particularly right more than the left.  He does endorse a right-sided radicular type pain that goes down the lateral side of the leg across the knee into the anterior shin more of an L5 distribution.  He does have some symptoms on the left as well.  He reports a prior history of electrodiagnostic study performed at [facility] many years ago.  He reports that this did not show any peripheral polyneuropathy which I guess is what they were looking for.  He has had a history of stroke and has multiple medical problems which are defined and the rest of the chart.  Patient does use a motorized scooter for ambulation.   ROS Otherwise per HPI.  Assessment & Plan: Visit Diagnoses:  1. Paresthesia of skin   2. Lumbar radiculopathy     Plan: No additional findings.  Impression: The above electrodiagnostic study is ABNORMAL and reveals evidence of mild chronic L5 radiculopathy on the right.    There is no significant electrodiagnostic evidence of any other lumbar radiculopathy, specifically a left L4 radiculopathy.  Recommendations: 1.  Follow-up with referring physician. 2.  Continue current management of symptoms.  ___________________________ Eddie Carter Board Certified, American Board of Physical Medicine and  Rehabilitation   Meds & Orders: No orders of the defined types were placed in this encounter.   Orders Placed This Encounter  Procedures  . NCV with EMG (electromyography)    Follow-up: Return for Dr. Basil Carter.   Procedures: No procedures performed  EMG & NCV Findings: Needle evaluation of the right anterior tibialis and the right Fibularis Longus muscles showed moderately increased spontaneous activity and diminished recruitment.  The right biceps femoris (short head) muscle showed increased insertional activity.  All remaining muscles (as indicated in the following table) showed no evidence of electrical instability.    Impression: The above electrodiagnostic study is ABNORMAL and reveals evidence of mild chronic L5 radiculopathy on the right.    There is no significant electrodiagnostic evidence of any other lumbar radiculopathy, specifically a left L4 radiculopathy.  Recommendations: 1.  Follow-up with referring physician. 2.  Continue current management of symptoms.  ___________________________ Eddie Carter FAAPMR Board Certified, American Board of Physical Medicine and Rehabilitation    EMG   Side Muscle Nerve Root Ins Act Fibs Psw Amp Dur Poly Recrt Int Fraser Din Comment  Left AntTibialis Dp Br Peron L4-5 Nml Nml Nml Nml Nml 0 Nml Nml   Left Fibularis Longus  Sup Br Peron L5-S1 Nml Nml Nml Nml Nml 0 Nml Nml   Left MedGastroc Tibial S1-2 Nml Nml Nml Nml Nml 0 Nml Nml   Left VastusMed Femoral L2-4 Nml Nml Nml Nml Nml 0 Nml Nml   Left BicepsFemS Sciatic L5-S1 Nml Nml Nml Nml  Nml 0 Nml Nml   Right AntTibialis Dp Br Peron L4-5 Nml *2+ *2+ Nml Nml 0 *Reduced Nml   Right Fibularis Longus  Sup Br Peron L5-S1 Nml *2+ *2+ Nml Nml 0 *Reduced Nml   Right MedGastroc Tibial S1-2 Nml Nml Nml Nml Nml 0 Nml Nml   Right VastusMed Femoral L2-4 Nml Nml Nml Nml Nml 0 Nml Nml   Right BicepsFemS Sciatic L5-S1 *Incr Nml Nml Nml Nml 0 Nml Nml         Clinical History: MRI LUMBAR SPINE  WITHOUT CONTRAST  TECHNIQUE: Multiplanar, multisequence MR imaging of the lumbar spine was performed. No intravenous contrast was administered.  COMPARISON:  Lumbar spine radiographs 07/13/2017 and MRI 05/01/2016  FINDINGS: Segmentation:  Standard.  Alignment:  Normal.  Vertebrae: As seen on recent radiographs, there is a fracture through the L1 superior endplate with approximately 10% vertebral body height loss and mild marrow edema. Edema mildly extends into the right L1 pedicle. There is no retropulsion. Other vertebral body heights are preserved. A sclerotic focus in the L2 vertebral body is unchanged from the prior MRI. Moderate type 2 endplate changes are again seen at L5-S1.  Conus medullaris: Extends to the L1 level and appears normal.  Paraspinal and other soft tissues: Postsurgical changes in the posterior lumbar soft tissues. No fluid collection.  Disc levels:  Disc desiccation throughout the lumbar spine.  T12-L1 and L1-2: Negative.  L2-3: Mild disc bulging, shallow right foraminal disc protrusion, and mild facet hypertrophy without stenosis, unchanged.  L3-4: Prior left laminectomy again noted. Mild disc bulging, a small left subarticular disc extrusion, and mild facet hypertrophy result in mild left lateral recess and mild bilateral neural foraminal stenosis. The disc protrusion has decreased in size with improved lateral recess patency and no clear L4 nerve root impingement. No spinal stenosis.  L4-5: Mild disc bulging and facet hypertrophy without significant stenosis, unchanged.  L5-S1: Chronic severe disc space narrowing. Circumferential disc bulging, endplate spurring, disc space height loss, and mild facet hypertrophy result in moderate right and mild left neural foraminal stenosis without spinal stenosis, unchanged.  IMPRESSION: 1. Recent L1 superior endplate fracture with mild height loss and marrow edema. 2. Decreased size of  L3-4 disc extrusion. 3. Unchanged neural foraminal stenosis at L3-4 and L5-S1.   Electronically Signed   By: Eddie Carter M.D.   On: 07/14/2017 10:33   He reports that he quit smoking about 22 years ago. His smoking use included cigarettes. He has a 60.00 pack-year smoking history. He has never used smokeless tobacco. No results for input(s): HGBA1C, LABURIC in the last 8760 hours.  Objective:  VS:  HT:    WT:   BMI:     BP:   HR: bpm  TEMP: ( )  RESP:  Physical Exam  Musculoskeletal:  Patient is morbidly obese with bilateral venous stasis disease without any open ulcers of the lower extremities.  He has edema bilaterally.  He actually has decent strength with dorsiflexion plantarflexion and EHL.  He has a lot of difficulty going from sit to stand.    Ortho Exam Imaging: No results found.  Past Medical/Family/Surgical/Social History: Medications & Allergies reviewed per EMR, new medications updated. Patient Active Problem List   Diagnosis Date Noted  . Closed compression fracture of L1 lumbar vertebra 07/15/2017  . Back pain 07/15/2017  . Aspiration into airway 07/15/2017  . Wheezing 07/15/2017  . Leukocytosis 07/15/2017  . Bilateral calf pain 07/15/2017  . Low back pain  07/13/2017  . Cephalalgia 01/29/2016  . Adiposity 01/29/2016  . Hypertriglyceridemia 01/29/2016  . H/O respiratory system disease 01/29/2016  . H/O erectile dysfunction 01/29/2016  . Cerebrovascular accident, old 01/29/2016  . Diabetes mellitus ([place]) 01/29/2016  . Chronic obstructive pulmonary disease ([place]) 01/29/2016  . Benign fibroma of prostate 01/29/2016  . Chronic pain 01/29/2016  . 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 Abnormal MRI, lumbar spine 01/29/2016  . Chronic low back pain (Location of Primary Source of Pain) (Bilateral) (L>R) 01/29/2016  . Chronic lower extremity pain (Location of Secondary source of pain) (Bilateral) (L>R) 01/29/2016  . Long term current use of opiate  analgesic 01/29/2016  . Long term prescription opiate use 01/29/2016  . Opiate use 01/29/2016  . Encounter for therapeutic drug level monitoring 01/29/2016  . Encounter for pain management planning 01/29/2016  . Chronic lumbar radicular pain (Location of Secondary source of pain) (Bilateral) (L4 Dermatome) (L>R) 01/29/2016  . Chronic hip pain (Location of Tertiary source of pain) (Bilateral) (L>R) 01/29/2016  . Chronic knee pain (Bilateral) (L>R) 01/29/2016  . Chronic anticoagulation (Plavix) 01/29/2016  . Erectile dysfunction of organic origin 01/22/2016  . BPH with obstruction/lower urinary tract symptoms 07/24/2015  . Hypogonadism in male 07/24/2015  . HNP (herniated nucleus pulposus), lumbar 11/05/2014    Class: Acute  . Herniated nucleus pulposus, lumbar 11/05/2014  . Pulmonary nodules 10/25/2013  . ILD (interstitial lung disease) ([place]) 08/22/2013  . Traumatic tear of lateral meniscus of right knee   . Medial meniscus, posterior horn derangement   . COPD (chronic obstructive pulmonary disease) ([place])   . Shortness of breath   . Sleep apnea   . Stroke ([place])   . Arthritis   . GERD (gastroesophageal reflux disease)   . History of GI bleed   . HOH (hard of hearing)   . Full dentures   . Complication of anesthesia    Past Medical History:  Diagnosis Date  . Arthritis   . Asthma   . BPH (benign prostatic hyperplasia)   . 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 Complication of anesthesia    after septoplasty and uvulectomy-was in icu- [facility] - 10 yrs. ago  . COPD (chronic obstructive pulmonary disease) ([place])   . Diabetes mellitus without complication ([place])   . ED (erectile dysfunction)   . Full dentures   . GERD (gastroesophageal reflux disease)    no meds  . Headache    difficulty with headaches- 1950's , again in 1967, 2008- again problems with migrraines   . History of GI bleed   . History of urethral stricture   . HOH (hard of hearing)   . Hypogonadism in  male   . Joint pain   . Medial meniscus, posterior horn derangement    left knee  . Obesity   . Pneumonia 2014   [facility]  . Shortness of breath   . Skin cancer   . Sleep apnea    uses a cpap, most nights   . Stroke [facility]) 1995   some speech and thought processes slow  . Traumatic tear of lateral meniscus of right knee    right knee  . Urinary frequency   . Urinary incontinence    Family History  Problem Relation Age of Onset  . Cancer Mother   . Heart attack Father   . Heart disease Unknown   . Arthritis Unknown   . Prostate cancer Neg Hx   . Kidney disease Neg Hx  Past Surgical History:  Procedure Laterality Date  . APPENDECTOMY  1963  . CARPAL TUNNEL RELEASE     left  . COLONOSCOPY    . COLONOSCOPY WITH PROPOFOL N/A 12/07/2016   Procedure: COLONOSCOPY WITH PROPOFOL;  Surgeon: Lollie Sails, MD;  Location: [facility] ENDOSCOPY;  Service: Endoscopy;  Laterality: N/A;  . EYE SURGERY     foreign body removed fr. eye, ? side   . FOOT FOREIGN BODY REMOVAL  1976   left foot -multiple pieces glass  . HEMORROIDECTOMY  1995  . KNEE ARTHROSCOPY Bilateral 01/17/2013   Procedure: ARTHROSCOPY KNEE BILATERAL WITH MEDIAL AND LATERAL MENISECTOMIES;  Surgeon: Lorn Junes, MD;  Location: [place];  Service: Orthopedics;  Laterality: Bilateral;  . KYPHOPLASTY N/A 07/23/2017   Procedure: KYPHOPLASTY- L1;  Surgeon: Hessie Knows, MD;  Location: [facility] ORS;  Service: Orthopedics;  Laterality: N/A;  . LUMBAR LAMINECTOMY N/A 11/05/2014   Procedure: LEFT L3-4 MICRODISCECTOMY;  Surgeon: Jessy Oto, MD;  Location: [place];  Service: Orthopedics;  Laterality: N/A;  . LUMBAR LAMINECTOMY/DECOMPRESSION MICRODISCECTOMY N/A 02/12/2015   Procedure: RE-DO LEFT L3-4 MICRODISCECTOMY;  Surgeon: Jessy Oto, MD;  Location: [place];  Service: Orthopedics;  Laterality: N/A;  . NASAL SEPTUM SURGERY  1995  . TONSILLECTOMY    . UPPER GI ENDOSCOPY    . UVULOPALATOPHARYNGOPLASTY  (UPPP)/TONSILLECTOMY/SEPTOPLASTY  1995   same time with septoplasty-ended up ICU almost trached   Social History   Occupational History    Comment: register of deeds  Tobacco Use  . Smoking status: Former Smoker    Packs/day: 1.50    Years: 40.00    Pack years: 60.00    Types: Cigarettes    Last attempt to quit: 01/14/1996    Years since quitting: 22.4  . Smokeless tobacco: Never Used  Substance and Sexual Activity  . Alcohol use: No    Comment: wine in a month  . Drug use: No  . Sexual activity: Never

## 2018-06-06 NOTE — Procedures (Signed)
EMG & NCV Findings: Needle evaluation of the right anterior tibialis and the right Fibularis Longus muscles showed moderately increased spontaneous activity and diminished recruitment.  The right biceps femoris (short head) muscle showed increased insertional activity.  All remaining muscles (as indicated in the following table) showed no evidence of electrical instability.    Impression: The above electrodiagnostic study is ABNORMAL and reveals evidence of mild chronic L5 radiculopathy on the right.    There is no significant electrodiagnostic evidence of any other lumbar radiculopathy, specifically a left L4 radiculopathy.  Recommendations: 1.  Follow-up with referring physician. 2.  Continue current management of symptoms.  ___________________________ Laurence Spates FAAPMR Board Certified, American Board of Physical Medicine and Rehabilitation    EMG   Side Muscle Nerve Root Ins Act Fibs Psw Amp Dur Poly Recrt Int Fraser Din Comment  Left AntTibialis Dp Br Peron L4-5 Nml Nml Nml Nml Nml 0 Nml Nml   Left Fibularis Longus  Sup Br Peron L5-S1 Nml Nml Nml Nml Nml 0 Nml Nml   Left MedGastroc Tibial S1-2 Nml Nml Nml Nml Nml 0 Nml Nml   Left VastusMed Femoral L2-4 Nml Nml Nml Nml Nml 0 Nml Nml   Left BicepsFemS Sciatic L5-S1 Nml Nml Nml Nml Nml 0 Nml Nml   Right AntTibialis Dp Br Peron L4-5 Nml *2+ *2+ Nml Nml 0 *Reduced Nml   Right Fibularis Longus  Sup Br Peron L5-S1 Nml *2+ *2+ Nml Nml 0 *Reduced Nml   Right MedGastroc Tibial S1-2 Nml Nml Nml Nml Nml 0 Nml Nml   Right VastusMed Femoral L2-4 Nml Nml Nml Nml Nml 0 Nml Nml   Right BicepsFemS Sciatic L5-S1 *Incr Nml Nml Nml Nml 0 Nml Nml

## 2018-06-13 ENCOUNTER — Ambulatory Visit (INDEPENDENT_AMBULATORY_CARE_PROVIDER_SITE_OTHER): Payer: Managed Care, Other (non HMO) | Admitting: Urology

## 2018-06-13 ENCOUNTER — Encounter: Payer: Self-pay | Admitting: Urology

## 2018-06-13 VITALS — BP 122/72 | HR 63 | Ht 72.0 in

## 2018-06-13 DIAGNOSIS — N3946 Mixed incontinence: Secondary | ICD-10-CM

## 2018-06-13 MED ORDER — FESOTERODINE FUMARATE ER 8 MG PO TB24: 8.0000 mg | ORAL_TABLET | Freq: Every day | ORAL | 11 refills | Status: DC

## 2018-06-13 NOTE — Progress Notes (Signed)
06/13/2018 3:17 PM   Eddie Carter 1941/04/30 [id number]  Referring provider: [name], MD [place],  [address]  No chief complaint on file.   HPI: Eddie Carter:  His IPSS score is25, which isseverelower urinary tract symptomatology. His PVR is 18mL. His previous I PSS score was 20/5.His previous PVR was69mL. His major complaintstoday are frequency, urgency, nocturia x 1-2,incontinencewith standing up and urgency, intermittency and hesitancy. He is currently on dutasteride 0.5 mg, Myrbetriq 50 mg, tamsulosin 0.4 mg and Vesicare 10 mg. He states that his incontinence is "awful." His has had an urethral stricture that had been dilated in the remote past.  Today Patient still is on multimodality therapy.  He still has urgency incontinence and he can leak with almost no warning.  He dates it back to an injury 3 years ago.  He said 3 back surgeries.  He is in a wheelchair.  He has minimal nocturia and frequency.  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  He does not have a stricture.  The role of Toviaz and stop the Vesicare was discussed.  I do not think he would be a good candidate for InterStim or Botox.  Percutaneous tibial nerve stimulation is an option.  Does need MRIs he does have a functional component.  I do not think urodynamics will change any treatment options  Stop Vesicare.  Add Toviaz 8 mg to polypharmacy and reassess 5 weeks possible percutaneous tibial nerve stimulation  Today She dramatically better on Toviaz in combination with Flomax and Myrbetriq.  He does not know if he still taking the Vesicare.  I wrote down to stop it.  We can go back on it of course if needed but that would be a lot of medication.  If so I would then stop the Myrbetriq and/or Flomax to get him off some of the medications.  Samples and prescription given      PMH: Past Medical History:  Diagnosis Date  .  Arthritis   . Asthma   . BPH (benign prostatic hyperplasia)   . 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 Complication of anesthesia    after septoplasty and uvulectomy-was in icu- [facility] - 10 yrs. ago  . COPD (chronic obstructive pulmonary disease) ([place])   . Diabetes mellitus without complication ([place])   . ED (erectile dysfunction)   . Full dentures   . GERD (gastroesophageal reflux disease)    no meds  . Headache    difficulty with headaches- 1950's , again in 1967, 2008- again problems with migrraines   . History of GI bleed   . History of urethral stricture   . HOH (hard of hearing)   . Hypogonadism in male   . Joint pain   . Medial meniscus, posterior horn derangement    left knee  . Obesity   . Pneumonia 2014   [facility]  . Shortness of breath   . Skin cancer   . Sleep apnea    uses a cpap, most nights   . Stroke [facility]) 1995   some speech and thought processes slow  . Traumatic tear of lateral meniscus of right knee    right knee  . Urinary frequency   . Urinary incontinence     Surgical History: Past Surgical History:  Procedure Laterality Date  . APPENDECTOMY  1963  . CARPAL TUNNEL RELEASE     left  . COLONOSCOPY    .  COLONOSCOPY WITH PROPOFOL N/A 12/07/2016   Procedure: COLONOSCOPY WITH PROPOFOL;  Surgeon: Lollie Sails, MD;  Location: [facility] ENDOSCOPY;  Service: Endoscopy;  Laterality: N/A;  . EYE SURGERY     foreign body removed fr. eye, ? side   . FOOT FOREIGN BODY REMOVAL  1976   left foot -multiple pieces glass  . HEMORROIDECTOMY  1995  . KNEE ARTHROSCOPY Bilateral 01/17/2013   Procedure: ARTHROSCOPY KNEE BILATERAL WITH MEDIAL AND LATERAL MENISECTOMIES;  Surgeon: Lorn Junes, MD;  Location: [place];  Service: Orthopedics;  Laterality: Bilateral;  . KYPHOPLASTY N/A 07/23/2017   Procedure: KYPHOPLASTY- L1;  Surgeon: Hessie Knows, MD;  Location: [facility] ORS;  Service: Orthopedics;  Laterality: N/A;  . LUMBAR LAMINECTOMY N/A  11/05/2014   Procedure: LEFT L3-4 MICRODISCECTOMY;  Surgeon: Jessy Oto, MD;  Location: [place];  Service: Orthopedics;  Laterality: N/A;  . LUMBAR LAMINECTOMY/DECOMPRESSION MICRODISCECTOMY N/A 02/12/2015   Procedure: RE-DO LEFT L3-4 MICRODISCECTOMY;  Surgeon: Jessy Oto, MD;  Location: [place];  Service: Orthopedics;  Laterality: N/A;  . NASAL SEPTUM SURGERY  1995  . TONSILLECTOMY    . UPPER GI ENDOSCOPY    . UVULOPALATOPHARYNGOPLASTY (UPPP)/TONSILLECTOMY/SEPTOPLASTY  1995   same time with septoplasty-ended up ICU almost trached    Home Medications:  Allergies as of 06/13/2018      Reactions   Asa [aspirin] Shortness Of Breath   Nsaids Other (See Comments)   Makes him bleed.   Tolmetin Other (See Comments)   Makes him bleed.      Medication List        Accurate as of 06/13/18  3:17 PM. Always use your most recent med list.          acetaminophen 500 MG tablet Commonly known as:  TYLENOL Take 500 mg by mouth every 8 (eight) hours as needed.   albuterol 108 (90 Base) MCG/ACT inhaler Commonly known as:  PROVENTIL HFA;VENTOLIN HFA Inhale into the lungs.   ALCORTIN A 1-2-1 % Gel Apply topically.   ANDROGEL PUMP 20.25 MG/ACT (1.62%) Gel Generic drug:  Testosterone AndroGel 20.25 mg/1.25 gram (1.62 %) transdermal gel pump   budesonide-formoterol 160-4.5 MCG/ACT inhaler Commonly known as:  SYMBICORT Inhale 2 puffs into the lungs 2 (two) times daily.   buPROPion 150 MG 24 hr tablet Commonly known as:  WELLBUTRIN XL bupropion HCl XL 150 mg 24 hr tablet, extended release   CINNAMON PO Take 4,000 mg by mouth daily at 2 PM.   clopidogrel 75 MG tablet Commonly known as:  PLAVIX Take 1 tablet (75 mg total) by mouth daily.   Co Q-10 100 MG Caps Take 1 tablet by mouth at bedtime.   donepezil 10 MG tablet Commonly known as:  ARICEPT Take 10 mg by mouth at bedtime.   dutasteride 0.5 MG capsule Commonly known as:  AVODART Take 1 capsule (0.5 mg total) by mouth daily.    fluticasone 50 MCG/ACT nasal spray Commonly known as:  FLONASE Place 2 sprays into both nostrils daily.   furosemide 20 MG tablet Commonly known as:  LASIX furosemide 20 mg tablet   hydrOXYzine 10 MG tablet Commonly known as:  ATARAX/VISTARIL Take 2 tablets by mouth at bedtime.   levothyroxine 50 MCG tablet Commonly known as:  SYNTHROID, LEVOTHROID Take 50 mcg by mouth daily. Take on an empty stomach with a glass of water at least 30-60 minutes before breakfast.   LINZESS 145 MCG Caps capsule Generic drug:  linaclotide Linzess 145 mcg capsule  LYRICA 50 MG capsule Generic drug:  pregabalin Lyrica 50 mg capsule   Magnesium Citrate 100 MG Tabs Take by mouth.   methylPREDNISolone 4 MG Tbpk tablet Commonly known as:  MEDROL DOSEPAK methylprednisolone 4 mg tablets in a dose pack   mirabegron ER 50 MG Tb24 tablet Commonly known as:  MYRBETRIQ Take 1 tablet (50 mg total) by mouth daily.   montelukast 10 MG tablet Commonly known as:  SINGULAIR montelukast 10 mg tablet   MULTI-VITAMINS Tabs Take by mouth.   oxyCODONE-acetaminophen 7.5-325 MG tablet Commonly known as:  PERCOCET Take 1 tablet by mouth every 8 (eight) hours as needed for severe pain.   polyethylene glycol packet Commonly known as:  MIRALAX / GLYCOLAX Take 17 g by mouth daily as needed. Reported on 01/21/2016   RA KRILL OIL 500 MG Caps Take by mouth.   solifenacin 10 MG tablet Commonly known as:  VESICARE Take 1 tablet (10 mg total) by mouth daily.   tamsulosin 0.4 MG Caps capsule Commonly known as:  FLOMAX Take 1 capsule (0.4 mg total) by mouth daily.   tiotropium 18 MCG inhalation capsule Commonly known as:  SPIRIVA Place 1 capsule (18 mcg total) into inhaler and inhale daily.   topiramate 200 MG tablet Commonly known as:  TOPAMAX Take 1 tablet by mouth daily.   traMADol 50 MG tablet Commonly known as:  ULTRAM Take 1 tablet (50 mg total) by mouth every 6 (six) hours as needed for moderate  pain.   UNABLE TO FIND Med Name: Focus Factor   Vitamin D 1000 units capsule Take 3,000 Units by mouth daily.       Allergies:  Allergies  Allergen Reactions  . Asa [Aspirin] Shortness Of Breath  . Nsaids Other (See Comments)    Makes him bleed.  . Tolmetin Other (See Comments)    Makes him bleed.    Family History: Family History  Problem Relation Age of Onset  . Cancer Mother   . Heart attack Father   . Heart disease Unknown   . Arthritis Unknown   . Prostate cancer Neg Hx   . Kidney disease Neg Hx     Social History:  reports that he quit smoking about 22 years ago. His smoking use included cigarettes. He has a 60.00 pack-year smoking history. He has never used smokeless tobacco. He reports that he does not drink alcohol or use drugs.  ROS:                                        Physical Exam: There were no vitals taken for this visit.  Constitutional:  Alert and oriented, No acute distress.  Laboratory Data: Lab Results  Component Value Date   WBC 12.0 (H) 07/15/2017   HGB 13.8 07/15/2017   HCT 40.6 07/15/2017   MCV 91.9 07/15/2017   PLT 163 07/15/2017    Lab Results  Component Value Date   CREATININE 0.87 07/13/2017    No results found for: PSA  Lab Results  Component Value Date   TESTOSTERONE 194 (L) 01/16/2016    Lab Results  Component Value Date   HGBA1C 5.7 (H) 05/31/2017    Urinalysis    Component Value Date/Time   COLORURINE YELLOW (A) 07/15/2017 0436   APPEARANCEUR Clear 05/09/2018 1547   LABSPEC 1.015 07/15/2017 0436   LABSPEC 1.012 07/25/2013 1646   PHURINE 6.0 07/15/2017 0436  GLUCOSEU Negative 05/09/2018 1547   GLUCOSEU Negative 07/25/2013 1646   HGBUR NEGATIVE 07/15/2017 0436   BILIRUBINUR Negative 05/09/2018 1547   BILIRUBINUR Negative 07/25/2013 [place] 07/15/2017 0436   PROTEINUR Negative 05/09/2018 1547   PROTEINUR NEGATIVE 07/15/2017 0436   NITRITE Negative 05/09/2018  1547   NITRITE NEGATIVE 07/15/2017 0436   LEUKOCYTESUR Negative 05/09/2018 1547   LEUKOCYTESUR Negative 07/25/2013 1646    Pertinent Imaging: \  Assessment & Plan: Reassess in 2 months  There are no diagnoses linked to this encounter.  No follow-ups on file.  Reece Packer, MD  [facility] [address] [place], [place] [address] [phone]

## 2018-06-14 ENCOUNTER — Telehealth: Payer: Self-pay

## 2018-06-14 DIAGNOSIS — N3946 Mixed incontinence: Secondary | ICD-10-CM

## 2018-06-14 DIAGNOSIS — N3942 Incontinence without sensory awareness: Secondary | ICD-10-CM

## 2018-06-14 NOTE — Telephone Encounter (Signed)
Patient's pharmacy will not cover Toviaz, they recommend trying oxybutynin,tolterodine tatrate, or trospium chloride ER. Please advise on recommendation.

## 2018-06-15 NOTE — Telephone Encounter (Signed)
Detrol LA  4mg 

## 2018-06-16 MED ORDER — TOLTERODINE TARTRATE ER 4 MG PO CP24: 4.0000 mg | ORAL_CAPSULE | Freq: Every day | ORAL | 3 refills | Status: DC

## 2018-06-16 NOTE — Telephone Encounter (Signed)
New order sent. Per Dr.MacDiarmid

## 2018-07-28 ENCOUNTER — Ambulatory Visit (INDEPENDENT_AMBULATORY_CARE_PROVIDER_SITE_OTHER): Payer: Managed Care, Other (non HMO) | Admitting: Specialist

## 2018-08-22 ENCOUNTER — Ambulatory Visit (INDEPENDENT_AMBULATORY_CARE_PROVIDER_SITE_OTHER): Payer: Managed Care, Other (non HMO) | Admitting: Urology

## 2018-08-22 ENCOUNTER — Encounter: Payer: Self-pay | Admitting: Urology

## 2018-08-22 VITALS — BP 109/67 | HR 65

## 2018-08-22 DIAGNOSIS — N3946 Mixed incontinence: Secondary | ICD-10-CM | POA: Diagnosis not present

## 2018-08-22 MED ORDER — FESOTERODINE FUMARATE ER 8 MG PO TB24: 8.0000 mg | ORAL_TABLET | Freq: Every day | ORAL | 11 refills | Status: DC

## 2018-08-22 NOTE — Progress Notes (Signed)
08/22/2018 2:07 PM   Eddie Carter [birth year] [id number]  Referring provider: [name], MD [address] [place], [place] [address]  No chief complaint on file.   HPI: Shannon:He is currently on dutasteride 0.5 mg, Myrbetriq 50 mg, tamsulosin 0.4 mg and Vesicare 10 mg. He states that his incontinence is "awful." His has had an urethral stricture that had been dilated in the remote past.  Today Patient still is on multimodality therapy. He still has urgency incontinence and he can leak with almost no warning. He dates it back to an injury 3 years ago. He said 3 back surgeries. He is in a wheelchair. He has minimal nocturia and frequency.  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 He does not have a stricture. The role of Toviaz and stop the Vesicare was discussed. I do not think he would be a good candidate for InterStim or Botox. Percutaneous tibial nerve stimulation is an option. Does need MRIs he does have a functional component. I do not think urodynamics will change any treatment options  Stop Vesicare. Add Toviaz 8 mg to polypharmacy and reassess 5 weeks possible percutaneous tibial nerve stimulation  She dramatically better on Toviaz in combination with Flomax and Myrbetriq.  He does not know if he still taking the Vesicare.  I wrote down to stop it.  We can go back on it of course if needed but that would be a lot of medication.  If so I would then stop the Myrbetriq and/or Flomax to get him off some of the medications.    Today Patient is pad free.  He timed voids.  Frequency stable.  Clinically not infected.  Exceptionally pleased with functional component to his incontinence   PMH: Past Medical History:  Diagnosis Date  . Arthritis   . Asthma   . BPH (benign prostatic hyperplasia)   . 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 Complication of anesthesia    after septoplasty and uvulectomy-was  in icu- [facility] - 10 yrs. ago  . COPD (chronic obstructive pulmonary disease) ([place])   . Diabetes mellitus without complication ([place])   . ED (erectile dysfunction)   . Full dentures   . GERD (gastroesophageal reflux disease)    no meds  . Headache    difficulty with headaches- 1950's , again in 1967, 2008- again problems with migrraines   . History of GI bleed   . History of urethral stricture   . HOH (hard of hearing)   . Hypogonadism in male   . Joint pain   . Medial meniscus, posterior horn derangement    left knee  . Obesity   . Pneumonia 2014   [facility]  . Shortness of breath   . Skin cancer   . Sleep apnea    uses a cpap, most nights   . Stroke [facility]) 1995   some speech and thought processes slow  . Traumatic tear of lateral meniscus of right knee    right knee  . Urinary frequency   . Urinary incontinence     Surgical History: Past Surgical History:  Procedure Laterality Date  . APPENDECTOMY  1963  . CARPAL TUNNEL RELEASE     left  . COLONOSCOPY    . COLONOSCOPY WITH PROPOFOL N/A 12/07/2016   Procedure: COLONOSCOPY WITH PROPOFOL;  Surgeon: Lollie Sails, MD;  Location: [facility] ENDOSCOPY;  Service: Endoscopy;  Laterality: N/A;  . EYE SURGERY  foreign body removed fr. eye, ? side   . FOOT FOREIGN BODY REMOVAL  1976   left foot -multiple pieces glass  . HEMORROIDECTOMY  1995  . KNEE ARTHROSCOPY Bilateral 01/17/2013   Procedure: ARTHROSCOPY KNEE BILATERAL WITH MEDIAL AND LATERAL MENISECTOMIES;  Surgeon: Lorn Junes, MD;  Location: [place];  Service: Orthopedics;  Laterality: Bilateral;  . KYPHOPLASTY N/A 07/23/2017   Procedure: KYPHOPLASTY- L1;  Surgeon: Hessie Knows, MD;  Location: [facility] ORS;  Service: Orthopedics;  Laterality: N/A;  . LUMBAR LAMINECTOMY N/A 11/05/2014   Procedure: LEFT L3-4 MICRODISCECTOMY;  Surgeon: Jessy Oto, MD;  Location: [place];  Service: Orthopedics;  Laterality: N/A;  . LUMBAR LAMINECTOMY/DECOMPRESSION  MICRODISCECTOMY N/A 02/12/2015   Procedure: RE-DO LEFT L3-4 MICRODISCECTOMY;  Surgeon: Jessy Oto, MD;  Location: [place];  Service: Orthopedics;  Laterality: N/A;  . NASAL SEPTUM SURGERY  1995  . TONSILLECTOMY    . UPPER GI ENDOSCOPY    . UVULOPALATOPHARYNGOPLASTY (UPPP)/TONSILLECTOMY/SEPTOPLASTY  1995   same time with septoplasty-ended up ICU almost trached    Home Medications:  Allergies as of 08/22/2018      Reactions   Asa [aspirin] Shortness Of Breath   Nsaids Other (See Comments)   Makes him bleed.   Tolmetin Other (See Comments)   Makes him bleed.      Medication List        Accurate as of 08/22/18  2:07 PM. Always use your most recent med list.          acetaminophen 500 MG tablet Commonly known as:  TYLENOL Take 500 mg by mouth every 8 (eight) hours as needed.   albuterol 108 (90 Base) MCG/ACT inhaler Commonly known as:  PROVENTIL HFA;VENTOLIN HFA Inhale into the lungs.   ALCORTIN A 1-2-1 % Gel Apply topically.   ANDROGEL PUMP 20.25 MG/ACT (1.62%) Gel Generic drug:  Testosterone AndroGel 20.25 mg/1.25 gram (1.62 %) transdermal gel pump   budesonide-formoterol 160-4.5 MCG/ACT inhaler Commonly known as:  SYMBICORT Inhale 2 puffs into the lungs 2 (two) times daily.   buPROPion 150 MG 24 hr tablet Commonly known as:  WELLBUTRIN XL bupropion HCl XL 150 mg 24 hr tablet, extended release   CINNAMON PO Take 4,000 mg by mouth daily at 2 PM.   Co Q-10 100 MG Caps Take 1 tablet by mouth at bedtime.   donepezil 10 MG tablet Commonly known as:  ARICEPT Take 10 mg by mouth at bedtime.   dutasteride 0.5 MG capsule Commonly known as:  AVODART Take 1 capsule (0.5 mg total) by mouth daily.   fesoterodine 8 MG Tb24 tablet Commonly known as:  TOVIAZ Take 1 tablet (8 mg total) by mouth daily.   fluticasone 50 MCG/ACT nasal spray Commonly known as:  FLONASE Place 2 sprays into both nostrils daily.   furosemide 20 MG tablet Commonly known as:   LASIX furosemide 20 mg tablet   hydrOXYzine 10 MG tablet Commonly known as:  ATARAX/VISTARIL Take 2 tablets by mouth at bedtime.   levothyroxine 50 MCG tablet Commonly known as:  SYNTHROID, LEVOTHROID Take 50 mcg by mouth daily. Take on an empty stomach with a glass of water at least 30-60 minutes before breakfast.   LINZESS 145 MCG Caps capsule Generic drug:  linaclotide Linzess 145 mcg capsule   LYRICA 50 MG capsule Generic drug:  pregabalin Lyrica 50 mg capsule   Magnesium Citrate 100 MG Tabs Take by mouth.   methylPREDNISolone 4 MG Tbpk tablet Commonly known as:  MEDROL DOSEPAK methylprednisolone 4 mg tablets in a dose pack   mirabegron ER 50 MG Tb24 tablet Commonly known as:  MYRBETRIQ Take 1 tablet (50 mg total) by mouth daily.   montelukast 10 MG tablet Commonly known as:  SINGULAIR montelukast 10 mg tablet   MULTI-VITAMINS Tabs Take by mouth.   polyethylene glycol packet Commonly known as:  MIRALAX / GLYCOLAX Take 17 g by mouth daily as needed. Reported on 01/21/2016   RA KRILL OIL 500 MG Caps Take by mouth.   solifenacin 10 MG tablet Commonly known as:  VESICARE Take 1 tablet (10 mg total) by mouth daily.   tamsulosin 0.4 MG Caps capsule Commonly known as:  FLOMAX Take 1 capsule (0.4 mg total) by mouth daily.   tiotropium 18 MCG inhalation capsule Commonly known as:  SPIRIVA Place 1 capsule (18 mcg total) into inhaler and inhale daily.   tolterodine 4 MG 24 hr capsule Commonly known as:  DETROL LA Take 1 capsule (4 mg total) by mouth daily.   topiramate 200 MG tablet Commonly known as:  TOPAMAX Take 1 tablet by mouth daily.   traMADol 50 MG tablet Commonly known as:  ULTRAM Take 1 tablet (50 mg total) by mouth every 6 (six) hours as needed for moderate pain.   UNABLE TO FIND Med Name: Focus Factor   Vitamin D 1000 units capsule Take 3,000 Units by mouth daily.       Allergies:  Allergies  Allergen Reactions  . Asa [Aspirin]  Shortness Of Breath  . Nsaids Other (See Comments)    Makes him bleed.  . Tolmetin Other (See Comments)    Makes him bleed.    Family History: Family History  Problem Relation Age of Onset  . Cancer Mother   . Heart attack Father   . Heart disease Unknown   . Arthritis Unknown   . Prostate cancer Neg Hx   . Kidney disease Neg Hx     Social History:  reports that he quit smoking about 22 years ago. His smoking use included cigarettes. He has a 60.00 pack-year smoking history. He has never used smokeless tobacco. He reports that he does not drink alcohol or use drugs.  ROS:                                        Physical Exam: There were no vitals taken for this visit.  Constitutional:  Alert and oriented, No acute distress.   Laboratory Data: Lab Results  Component Value Date   WBC 12.0 (H) 07/15/2017   HGB 13.8 07/15/2017   HCT 40.6 07/15/2017   MCV 91.9 07/15/2017   PLT 163 07/15/2017    Lab Results  Component Value Date   CREATININE 0.87 07/13/2017    No results found for: PSA  Lab Results  Component Value Date   TESTOSTERONE 194 (L) 01/16/2016    Lab Results  Component Value Date   HGBA1C 5.7 (H) 05/31/2017    Urinalysis    Component Value Date/Time   COLORURINE YELLOW (A) 07/15/2017 0436   APPEARANCEUR Clear 05/09/2018 1547   LABSPEC 1.015 07/15/2017 0436   LABSPEC 1.012 07/25/2013 1646   PHURINE 6.0 07/15/2017 0436   GLUCOSEU Negative 05/09/2018 1547   GLUCOSEU Negative 07/25/2013 1646   HGBUR NEGATIVE 07/15/2017 0436   BILIRUBINUR Negative 05/09/2018 1547   BILIRUBINUR Negative 07/25/2013 1646   KETONESUR NEGATIVE  07/15/2017 0436   PROTEINUR Negative 05/09/2018 1547   PROTEINUR NEGATIVE 07/15/2017 0436   NITRITE Negative 05/09/2018 1547   NITRITE NEGATIVE 07/15/2017 0436   LEUKOCYTESUR Negative 05/09/2018 1547   LEUKOCYTESUR Negative 07/25/2013 1646    Pertinent Imaging:   Assessment & Plan: I gave him more  Toviaz 8 mg samples and prescription.  I asked him to bring in his drug list next time so I can hopefully subtract at least one lower urinary tract medication.  There are no diagnoses linked to this encounter.  No follow-ups on file.  Reece Packer, MD  [facility] [address] [place], [place] [address] [phone]

## 2018-08-29 ENCOUNTER — Telehealth (INDEPENDENT_AMBULATORY_CARE_PROVIDER_SITE_OTHER): Payer: Self-pay

## 2018-08-29 NOTE — Telephone Encounter (Signed)
Patient would like a call back from Dr. Louanne Skye.  Stated that he needs his MRI reports to be faxed to [facility] in [place], [place] and would like for Dr. Louanne Skye to call Roselie Awkward Ravdl.  Cb# is [phone] for patient.  Phone number is [phone].  Fax# [phone]. Please advise.  Thank you.

## 2018-09-01 NOTE — Telephone Encounter (Signed)
IC, LMVM advising he needs to sign records release form before we can fax his records out as he is requesting

## 2018-09-01 NOTE — Telephone Encounter (Signed)
Do the MRI's need to be sent out by you?

## 2018-09-02 ENCOUNTER — Encounter (INDEPENDENT_AMBULATORY_CARE_PROVIDER_SITE_OTHER): Payer: Self-pay | Admitting: Specialist

## 2018-09-02 ENCOUNTER — Ambulatory Visit (INDEPENDENT_AMBULATORY_CARE_PROVIDER_SITE_OTHER): Payer: Managed Care, Other (non HMO) | Admitting: Specialist

## 2018-09-02 VITALS — BP 100/56 | HR 72 | Ht 72.0 in | Wt 294.2 lb

## 2018-09-02 DIAGNOSIS — M48062 Spinal stenosis, lumbar region with neurogenic claudication: Secondary | ICD-10-CM

## 2018-09-02 DIAGNOSIS — M5136 Other intervertebral disc degeneration, lumbar region: Secondary | ICD-10-CM

## 2018-09-02 DIAGNOSIS — M1711 Unilateral primary osteoarthritis, right knee: Secondary | ICD-10-CM

## 2018-09-02 NOTE — Patient Instructions (Addendum)
Avoid bending, stooping and avoid lifting weights greater than 10 lbs. Avoid prolong standing and walking. Avoid frequent bending and stooping  No lifting greater than 10 lbs. May use ice or moist heat for pain. Weight loss is of benefit. Handicap license is approved.  Knee is suffering from osteoarthritis, only real proven treatments are Weight loss tylenol and exercise. Well padded shoes help. Ice the knee 2-3 times a day 15-20 mins at a time. CBD oil transdermal or oral.

## 2018-09-02 NOTE — Progress Notes (Signed)
Office Visit Note   Patient: Eddie Carter           Date of Birth: December 14, 1940           MRN: [id number] Visit Date: 09/02/2018              Requested by: [name], MD [address] [place], [place] [address] PCP: [name], MD   Assessment & Plan: Visit Diagnoses:  1. Degenerative lumbar disc   2. Unilateral primary osteoarthritis, right knee   3. Spinal stenosis of lumbar region with neurogenic claudication     Plan: Avoid bending, stooping and avoid lifting weights greater than 10 lbs. Avoid prolong standing and walking. Avoid frequent bending and stooping  No lifting greater than 10 lbs. May use ice or moist heat for pain. Weight loss is of benefit. Handicap license is approved.  Knee is suffering from osteoarthritis, only real proven treatments are Weight loss tylenol and exercise. Well padded shoes help. Ice the knee 2-3 times a day 15-20 mins at a time. CBD oil transdermal or oral.   Follow-Up Instructions: Return in about 4 weeks (around 09/30/2018).   Orders:  No orders of the defined types were placed in this encounter.  No orders of the defined types were placed in this encounter.     Procedures: No procedures performed   Clinical Data: No additional findings.   Subjective: Chief Complaint  Patient presents with  . Lower Back - Pain, Follow-up    77 year old male with history of left L3-4 microdiscectomy for HNP and recurrent HNP with redo microdiscectomy. He relates that he is at the point where the pain and difficulty walking is about to drive him crazy. He relates that he has an orthopaedic surgeon friend that would like to review his studies and see if he could help. I will try to help him with this.     Review of Systems  Constitutional: Negative.  Negative for activity change, appetite change, chills, diaphoresis, fatigue, fever and unexpected weight change.  HENT: Negative.   Eyes: Negative.   Respiratory: Negative.     Cardiovascular: Negative.   Gastrointestinal: Negative.   Endocrine: Negative.   Genitourinary: Negative.   Musculoskeletal: Negative.   Skin: Negative.   Allergic/Immunologic: Negative.   Neurological: Negative.   Hematological: Negative.   Psychiatric/Behavioral: Negative.      Objective: Vital Signs: BP (!) 100/56 (BP Location: Left Arm, Patient Position: Sitting, Cuff Size: Large)   Pulse 72   Ht 6' (1.829 m)   Wt 294 lb 4 oz (133.5 kg)   BMI 39.91 kg/m   Physical Exam  Constitutional: He is oriented to person, place, and time. He appears well-developed and well-nourished.  HENT:  Head: Normocephalic and atraumatic.  Eyes: Pupils are equal, round, and reactive to light. EOM are normal.  Neck: Normal range of motion. Neck supple.  Pulmonary/Chest: Effort normal and breath sounds normal.  Abdominal: Soft. Bowel sounds are normal.  Musculoskeletal: Normal range of motion.  Neurological: He is alert and oriented to person, place, and time.  Skin: Skin is warm and dry.  Psychiatric: He has a normal mood and affect. His behavior is normal. Judgment and thought content normal.    Right Knee Exam   Range of Motion  Extension: normal  Flexion: 130   Tests  McMurray:  Medial - negative Lateral - negative Varus: negative Valgus: negative Lachman:  Anterior - negative    Posterior - negative Drawer:  Anterior - negative    Posterior - negative Pivot shift: negative Patellar apprehension: positive  Other  Erythema: absent Scars: absent Sensation: normal Pulse: present Swelling: mild   Left Knee Exam   Range of Motion  Extension: normal  Flexion: 130       Specialty Comments:  No specialty comments available.  Imaging: No results found.   PMFS History: Patient Active Problem List   Diagnosis Date Noted  . Closed compression fracture of L1 lumbar vertebra 07/15/2017  . Back pain 07/15/2017  . Aspiration into airway 07/15/2017  . Wheezing  07/15/2017  . Leukocytosis 07/15/2017  . Bilateral calf pain 07/15/2017  . Low back pain 07/13/2017  . Cephalalgia 01/29/2016  . Adiposity 01/29/2016  . Hypertriglyceridemia 01/29/2016  . H/O respiratory system disease 01/29/2016  . H/O erectile dysfunction 01/29/2016  . Cerebrovascular accident, old 01/29/2016  . Diabetes mellitus ([place]) 01/29/2016  . Chronic obstructive pulmonary disease ([place]) 01/29/2016  . Benign fibroma of prostate 01/29/2016  . Chronic pain 01/29/2016  . 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 Abnormal MRI, lumbar spine 01/29/2016  . Chronic low back pain (Location of Primary Source of Pain) (Bilateral) (L>R) 01/29/2016  . Chronic lower extremity pain (Location of Secondary source of pain) (Bilateral) (L>R) 01/29/2016  . Long term current use of opiate analgesic 01/29/2016  . Long term prescription opiate use 01/29/2016  . Opiate use 01/29/2016  . Encounter for therapeutic drug level monitoring 01/29/2016  . Encounter for pain management planning 01/29/2016  . Chronic lumbar radicular pain (Location of Secondary source of pain) (Bilateral) (L4 Dermatome) (L>R) 01/29/2016  . Chronic hip pain (Location of Tertiary source of pain) (Bilateral) (L>R) 01/29/2016  . Chronic knee pain (Bilateral) (L>R) 01/29/2016  . Chronic anticoagulation (Plavix) 01/29/2016  . Erectile dysfunction of organic origin 01/22/2016  . BPH with obstruction/lower urinary tract symptoms 07/24/2015  . Hypogonadism in male 07/24/2015  . HNP (herniated nucleus pulposus), lumbar 11/05/2014    Class: Acute  . Herniated nucleus pulposus, lumbar 11/05/2014  . Pulmonary nodules 10/25/2013  . ILD (interstitial lung disease) ([place]) 08/22/2013  . Traumatic tear of lateral meniscus of right knee   . Medial meniscus, posterior horn derangement   . COPD (chronic obstructive pulmonary disease) ([place])   . Shortness of breath   . Sleep apnea   . Stroke ([place])   . Arthritis   . GERD (gastroesophageal  reflux disease)   . History of GI bleed   . HOH (hard of hearing)   . Full dentures   . Complication of anesthesia    Past Medical History:  Diagnosis Date  . Arthritis   . Asthma   . BPH (benign prostatic hyperplasia)   . 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 Complication of anesthesia    after septoplasty and uvulectomy-[facility]  . COPD (chronic obstructive pulmonary disease) ([place])   . Diabetes mellitus without complication ([place])   . ED (erectile dysfunction)   . Full dentures   . GERD (gastroesophageal reflux disease)    no meds  . Headache    difficulty with headaches- 1950's , again in 1967, 2008- again problems with migrraines   . History of GI bleed   . History of urethral stricture   . HOH (hard of hearing)   . Hypogonadism in male   . Joint pain   . Medial meniscus, posterior horn derangement    left knee  . Obesity   .  Pneumonia 2014   [facility]  . Shortness of breath   . Skin cancer   . Sleep apnea    uses a cpap, most nights   . Stroke [facility]) 1995   some speech and thought processes slow  . Traumatic tear of lateral meniscus of right knee    right knee  . Urinary frequency   . Urinary incontinence     Family History  Problem Relation Age of Onset  . Cancer Mother   . Heart attack Father   . Heart disease Unknown   . Arthritis Unknown   . Prostate cancer Neg Hx   . Kidney disease Neg Hx     Past Surgical History:  Procedure Laterality Date  . APPENDECTOMY  1963  . CARPAL TUNNEL RELEASE     left  . COLONOSCOPY    . COLONOSCOPY WITH PROPOFOL N/A 12/07/2016   Procedure: COLONOSCOPY WITH PROPOFOL;  Surgeon: Lollie Sails, MD;  Location: [facility] ENDOSCOPY;  Service: Endoscopy;  Laterality: N/A;  . EYE SURGERY     foreign body removed fr. eye, ? side   . FOOT FOREIGN BODY REMOVAL  1976   left foot -multiple pieces glass  . HEMORROIDECTOMY  1995  . KNEE ARTHROSCOPY Bilateral 01/17/2013   Procedure: ARTHROSCOPY KNEE  BILATERAL WITH MEDIAL AND LATERAL MENISECTOMIES;  Surgeon: Lorn Junes, MD;  Location: [place];  Service: Orthopedics;  Laterality: Bilateral;  . KYPHOPLASTY N/A 07/23/2017   Procedure: KYPHOPLASTY- L1;  Surgeon: Hessie Knows, MD;  Location: [facility] ORS;  Service: Orthopedics;  Laterality: N/A;  . LUMBAR LAMINECTOMY N/A 11/05/2014   Procedure: LEFT L3-4 MICRODISCECTOMY;  Surgeon: Jessy Oto, MD;  Location: [place];  Service: Orthopedics;  Laterality: N/A;  . LUMBAR LAMINECTOMY/DECOMPRESSION MICRODISCECTOMY N/A 02/12/2015   Procedure: RE-DO LEFT L3-4 MICRODISCECTOMY;  Surgeon: Jessy Oto, MD;  Location: [place];  Service: Orthopedics;  Laterality: N/A;  . NASAL SEPTUM SURGERY  1995  . TONSILLECTOMY    . UPPER GI ENDOSCOPY    . UVULOPALATOPHARYNGOPLASTY (UPPP)/TONSILLECTOMY/SEPTOPLASTY  1995   same time with septoplasty-ended up ICU almost trached   Social History   Occupational History    Comment: register of deeds  Tobacco Use  . Smoking status: Former Smoker    Packs/day: 1.50    Years: 40.00    Pack years: 60.00    Types: Cigarettes    Last attempt to quit: 01/14/1996    Years since quitting: 22.6  . Smokeless tobacco: Never Used  Substance and Sexual Activity  . Alcohol use: No    Comment: wine in a month  . Drug use: No  . Sexual activity: Never

## 2018-09-06 ENCOUNTER — Encounter: Payer: Self-pay | Admitting: *Deleted

## 2018-09-13 ENCOUNTER — Encounter: Payer: Self-pay | Admitting: Emergency Medicine

## 2018-09-13 ENCOUNTER — Ambulatory Visit: Payer: Managed Care, Other (non HMO) | Admitting: Anesthesiology

## 2018-09-13 ENCOUNTER — Other Ambulatory Visit: Payer: Self-pay

## 2018-09-13 ENCOUNTER — Ambulatory Visit
Admission: RE | Admit: 2018-09-13 | Discharge: 2018-09-13 | Disposition: A | Discharge status: Home / Self Care | Payer: Managed Care, Other (non HMO) | Source: Ambulatory Visit | Attending: Ophthalmology | Admitting: Ophthalmology

## 2018-09-13 ENCOUNTER — Encounter
Admission: RE | Disposition: A | Discharge status: Home / Self Care | Payer: Self-pay | Source: Ambulatory Visit | Attending: Ophthalmology

## 2018-09-13 DIAGNOSIS — G4730 Sleep apnea, unspecified: Secondary | ICD-10-CM | POA: Insufficient documentation

## 2018-09-13 DIAGNOSIS — I69328 Other speech and language deficits following cerebral infarction: Secondary | ICD-10-CM | POA: Insufficient documentation

## 2018-09-13 DIAGNOSIS — Z6839 Body mass index (BMI) 39.0-39.9, adult: Secondary | ICD-10-CM | POA: Diagnosis not present

## 2018-09-13 DIAGNOSIS — E669 Obesity, unspecified: Secondary | ICD-10-CM | POA: Insufficient documentation

## 2018-09-13 DIAGNOSIS — J449 Chronic obstructive pulmonary disease, unspecified: Secondary | ICD-10-CM | POA: Diagnosis not present

## 2018-09-13 DIAGNOSIS — Z87891 Personal history of nicotine dependence: Secondary | ICD-10-CM | POA: Diagnosis not present

## 2018-09-13 DIAGNOSIS — E1136 Type 2 diabetes mellitus with diabetic cataract: Secondary | ICD-10-CM | POA: Diagnosis present

## 2018-09-13 DIAGNOSIS — I69318 Other symptoms and signs involving cognitive functions following cerebral infarction: Secondary | ICD-10-CM | POA: Diagnosis not present

## 2018-09-13 DIAGNOSIS — H2512 Age-related nuclear cataract, left eye: Secondary | ICD-10-CM | POA: Insufficient documentation

## 2018-09-13 DIAGNOSIS — Z85828 Personal history of other malignant neoplasm of skin: Secondary | ICD-10-CM | POA: Diagnosis not present

## 2018-09-13 HISTORY — PX: CATARACT EXTRACTION W/PHACO: SHX586

## 2018-09-13 HISTORY — DX: Depression, unspecified: F32.A

## 2018-09-13 HISTORY — DX: Major depressive disorder, single episode, unspecified: F32.9

## 2018-09-13 SURGERY — PHACOEMULSIFICATION, CATARACT, WITH IOL INSERTION
Anesthesia: Monitor Anesthesia Care | Site: Eye | Laterality: Left

## 2018-09-13 MED ORDER — NA CHONDROIT SULF-NA HYALURON 40-17 MG/ML IO SOLN
INTRAOCULAR | Status: AC
  Filled 2018-09-13: qty 1

## 2018-09-13 MED ORDER — MIDAZOLAM HCL 2 MG/2ML IJ SOLN
INTRAMUSCULAR | Status: AC
  Filled 2018-09-13: qty 2

## 2018-09-13 MED ORDER — MOXIFLOXACIN HCL 0.5 % OP SOLN
OPHTHALMIC | Status: DC | PRN
  Administered 2018-09-13: .2 mL via OPHTHALMIC

## 2018-09-13 MED ORDER — EPINEPHRINE PF 1 MG/ML IJ SOLN
INTRAOCULAR | Status: DC | PRN
  Administered 2018-09-13: 1 mL via OPHTHALMIC

## 2018-09-13 MED ORDER — FENTANYL CITRATE (PF) 100 MCG/2ML IJ SOLN
INTRAMUSCULAR | Status: AC
  Filled 2018-09-13: qty 2

## 2018-09-13 MED ORDER — MOXIFLOXACIN HCL 0.5 % OP SOLN
OPHTHALMIC | Status: AC
  Filled 2018-09-13: qty 3

## 2018-09-13 MED ORDER — LIDOCAINE HCL (PF) 4 % IJ SOLN
INTRAMUSCULAR | Status: AC
  Filled 2018-09-13: qty 5

## 2018-09-13 MED ORDER — POVIDONE-IODINE 5 % OP SOLN
OPHTHALMIC | Status: DC | PRN
  Administered 2018-09-13: 1 via OPHTHALMIC

## 2018-09-13 MED ORDER — ARMC OPHTHALMIC DILATING DROPS
OPHTHALMIC | Status: AC
  Administered 2018-09-13: 1 via OPHTHALMIC
  Filled 2018-09-13: qty 0.5

## 2018-09-13 MED ORDER — TETRACAINE HCL 0.5 % OP SOLN
OPHTHALMIC | Status: AC
  Administered 2018-09-13: 1 [drp] via OPHTHALMIC
  Filled 2018-09-13: qty 4

## 2018-09-13 MED ORDER — MIDAZOLAM HCL 2 MG/2ML IJ SOLN
INTRAMUSCULAR | Status: DC | PRN
  Administered 2018-09-13: 1 mg via INTRAVENOUS

## 2018-09-13 MED ORDER — NA CHONDROIT SULF-NA HYALURON 40-17 MG/ML IO SOLN
INTRAOCULAR | Status: DC | PRN
  Administered 2018-09-13: 1 mL via INTRAOCULAR

## 2018-09-13 MED ORDER — POVIDONE-IODINE 5 % OP SOLN
OPHTHALMIC | Status: AC
  Filled 2018-09-13: qty 30

## 2018-09-13 MED ORDER — EPINEPHRINE PF 1 MG/ML IJ SOLN
INTRAMUSCULAR | Status: AC
  Filled 2018-09-13: qty 1

## 2018-09-13 MED ORDER — CARBACHOL 0.01 % IO SOLN
INTRAOCULAR | Status: DC | PRN
  Administered 2018-09-13: .5 mL via INTRAOCULAR

## 2018-09-13 MED ORDER — LIDOCAINE HCL (PF) 4 % IJ SOLN
INTRAOCULAR | Status: DC | PRN
  Administered 2018-09-13: 2 mL via OPHTHALMIC

## 2018-09-13 MED ORDER — TETRACAINE HCL 0.5 % OP SOLN
1.0000 [drp] | OPHTHALMIC | Status: AC | PRN
  Administered 2018-09-13 (×3): 1 [drp] via OPHTHALMIC

## 2018-09-13 MED ORDER — FENTANYL CITRATE (PF) 100 MCG/2ML IJ SOLN
INTRAMUSCULAR | Status: DC | PRN
  Administered 2018-09-13: 50 ug via INTRAVENOUS

## 2018-09-13 MED ORDER — SODIUM CHLORIDE 0.9 % IV SOLN
INTRAVENOUS | Status: DC
  Administered 2018-09-13: 08:00:00 via INTRAVENOUS

## 2018-09-13 MED ORDER — MOXIFLOXACIN HCL 0.5 % OP SOLN
1.0000 [drp] | OPHTHALMIC | Status: DC | PRN
Start: 2018-09-13 — End: 2018-09-13

## 2018-09-13 MED ORDER — ARMC OPHTHALMIC DILATING DROPS
1.0000 "application " | OPHTHALMIC | Status: AC
  Administered 2018-09-13 (×3): 1 via OPHTHALMIC

## 2018-09-13 SURGICAL SUPPLY — 16 items
GLOVE BIO SURGEON STRL SZ8 (GLOVE) ×3 IMPLANT
GLOVE BIOGEL M 6.5 STRL (GLOVE) ×3 IMPLANT
GLOVE SURG LX 8.0 MICRO (GLOVE) ×2
GLOVE SURG LX STRL 8.0 MICRO (GLOVE) ×1 IMPLANT
GOWN STRL REUS W/ TWL LRG LVL3 (GOWN DISPOSABLE) ×2 IMPLANT
GOWN STRL REUS W/TWL LRG LVL3 (GOWN DISPOSABLE) ×4
LABEL CATARACT MEDS ST (LABEL) ×3 IMPLANT
LENS IOL TECNIS ITEC 19.5 (Intraocular Lens) ×2 IMPLANT
PACK CATARACT (MISCELLANEOUS) ×3 IMPLANT
PACK CATARACT BRASINGTON LX (MISCELLANEOUS) ×3 IMPLANT
PACK EYE AFTER SURG (MISCELLANEOUS) ×3 IMPLANT
SOL BSS BAG (MISCELLANEOUS) ×3
SOLUTION BSS BAG (MISCELLANEOUS) ×1 IMPLANT
SYR 5ML LL (SYRINGE) ×3 IMPLANT
WATER STERILE IRR 250ML POUR (IV SOLUTION) ×3 IMPLANT
WIPE NON LINTING 3.25X3.25 (MISCELLANEOUS) ×3 IMPLANT

## 2018-09-13 NOTE — Anesthesia Postprocedure Evaluation (Signed)
Anesthesia Post Note  Patient: MAJOUR FREI  Procedure(s) Performed: CATARACT EXTRACTION PHACO AND INTRAOCULAR LENS PLACEMENT (IOC) (Left Eye)  Patient location during evaluation: PACU Anesthesia Type: MAC Level of consciousness: awake and alert and oriented Pain management: pain level controlled Vital Signs Assessment: post-procedure vital signs reviewed and stable Respiratory status: spontaneous breathing, nonlabored ventilation and respiratory function stable Cardiovascular status: blood pressure returned to baseline and stable Postop Assessment: no signs of nausea or vomiting Anesthetic complications: no     Last Vitals:  Vitals:   09/13/18 0747 09/13/18 0906  BP: 132/63 124/68  Pulse: (!) 57 (!) 59  Resp: 17 16  Temp: (!) 35 C (!) 35.8 C  SpO2: 98% 98%    Last Pain:  Vitals:   09/13/18 0906  TempSrc: Temporal  PainSc: 0-No pain                 Lyndsee Casa

## 2018-09-13 NOTE — Op Note (Signed)
PREOPERATIVE DIAGNOSIS:  Nuclear sclerotic cataract of the left eye.   POSTOPERATIVE DIAGNOSIS:  Nuclear sclerotic cataract of the left eye.   OPERATIVE PROCEDURE: Procedure(s): CATARACT EXTRACTION PHACO AND INTRAOCULAR LENS PLACEMENT (IOC)   SURGEON:  Birder Robson, MD.   ANESTHESIA:  Anesthesiologist: Emmie Niemann, MD CRNA: Vaughan Sine  1.      Managed anesthesia care. 2.     0.15ml of Shugarcaine was instilled following the paracentesis   COMPLICATIONS:  None.   TECHNIQUE:   Stop and chop   DESCRIPTION OF PROCEDURE:  The patient was examined and consented in the preoperative holding area where the aforementioned topical anesthesia was applied to the left eye and then brought back to the Operating Room where the left eye was prepped and draped in the usual sterile ophthalmic fashion and a lid speculum was placed. A paracentesis was created with the side port blade and the anterior chamber was filled with viscoelastic. A near clear corneal incision was performed with the steel keratome. A continuous curvilinear capsulorrhexis was performed with a cystotome followed by the capsulorrhexis forceps. Hydrodissection and hydrodelineation were carried out with BSS on a blunt cannula. The lens was removed in a stop and chop  technique and the remaining cortical material was removed with the irrigation-aspiration handpiece. The capsular bag was inflated with viscoelastic and the Technis [id number] lens was placed in the capsular bag without complication. The remaining viscoelastic was removed from the eye with the irrigation-aspiration handpiece. The wounds were hydrated. The anterior chamber was flushed with Miostat and the eye was inflated to physiologic pressure. 0.43ml Vigamox was placed in the anterior chamber. The wounds were found to be water tight. The eye was dressed with Vigamox. The patient was given protective glasses to wear throughout the day and a shield with which to sleep tonight.  The patient was also given drops with which to begin a drop regimen today and will follow-up with me in one day. Implant Name Type Inv. Item Serial No. Manufacturer Lot No. LRB No. Used  LENS IOL DIOP 19.5 - [id number] Intraocular Lens LENS IOL DIOP 19.5 [id number] AMO  Left 1    Procedure(s) with comments: CATARACT EXTRACTION PHACO AND INTRAOCULAR LENS PLACEMENT (IOC) (Left) - [place]  00:42 CDE 6.73 Fluid pack lot #@ [id number]  Electronically signed: Birder Robson 09/13/2018 9:04 AM

## 2018-09-13 NOTE — H&P (Signed)
All labs reviewed. Abnormal studies sent to patients PCP when indicated.  Previous H&P reviewed, patient examined, there are NO CHANGES.  Eddie Reddick Porfilio10/22/20198:39 AM

## 2018-09-13 NOTE — Discharge Instructions (Addendum)
Eye Surgery Discharge Instructions  Expect mild scratchy sensation or mild soreness. DO NOT RUB YOUR EYE!  The day of surgery:  Minimal physical activity, but bed rest is not required  No reading, computer work, or close hand work  No bending, lifting, or straining.  May watch TV  For 24 hours:  No driving, legal decisions, or alcoholic beverages  Safety precautions  Eat anything you prefer: It is better to start with liquids, then soup then solid foods.  Solar shield eyeglasses should be worn for comfort in the sunlight/patch while sleeping  Resume all regular medications including aspirin or Coumadin if these were discontinued prior to surgery. You may shower, bathe, shave, or wash your hair. Tylenol may be taken for mild discomfort. FOLLOW DR. PORFILIO'S EYE DROP INSTRUCTION SHEET AS REVIEWED.  Call your doctor if you experience significant pain, nausea, or vomiting, fever > 101 or other signs of infection. [phone] or [phone] Specific instructions:  Follow-up Information    Birder Robson, MD Follow up.   Specialty:  Ophthalmology Why:  09/14/18 @ 11:00 am Contact information: [address] [place] [address] [phone]

## 2018-09-13 NOTE — Anesthesia Preprocedure Evaluation (Signed)
Anesthesia Evaluation  Patient identified by MRN, date of birth, ID band Patient awake    Reviewed: Allergy & Precautions, NPO status , Patient's Chart, lab work & pertinent test results  History of Anesthesia Complications Negative for: history of anesthetic complications  Airway Mallampati: III  TM Distance: >3 FB Neck ROM: Full    Dental  (+) Upper Dentures, Lower Dentures   Pulmonary asthma , sleep apnea (stopped using CPAP) , COPD,  COPD inhaler, former smoker,    breath sounds clear to auscultation- rhonchi (-) wheezing      Cardiovascular (-) CAD, (-) Past MI, (-) Cardiac Stents and (-) CABG  Rhythm:Regular Rate:Normal - Systolic murmurs and - Diastolic murmurs    Neuro/Psych  Headaches, PSYCHIATRIC DISORDERS Depression CVA (speech changes)    GI/Hepatic Neg liver ROS, GERD  ,  Endo/Other  diabetesHypothyroidism   Renal/GU negative Renal ROS     Musculoskeletal  (+) Arthritis ,   Abdominal (+) + obese,   Peds  Hematology negative hematology ROS (+)   Anesthesia Other Findings Past Medical History: No date: Arthritis No date: Asthma No date: BPH (benign prostatic hyperplasia) No date: Cancer ([place])     Comment:  face- basal cell No date: Complication of anesthesia     Comment:  after septoplasty and uvulectomy-was [facility] [place]               [facility] 10 yrs. ago No date: COPD (chronic obstructive pulmonary disease) (HCC) No date: Depression No date: Diabetes mellitus without complication (HCC) No date: ED (erectile dysfunction) No date: Full dentures No date: GERD (gastroesophageal reflux disease)     Comment:  no meds No date: Headache     Comment:  difficulty with headaches- 1950's , again in 1967, 2008-              again problems with migrraines  No date: History of GI bleed No date: History of urethral stricture No date: HOH (hard of hearing) No date: HOH (hard of hearing) No date:  Hypogonadism in male No date: Joint pain No date: Medial meniscus, posterior horn derangement     Comment:  left knee No date: Obesity 2014: Pneumonia     Comment:  [facility] No date: Shortness of breath No date: Skin cancer No date: Sleep apnea     Comment:  uses a cpap, most nights  1995: Stroke ([place])     Comment:  some speech and thought processes slow No date: Traumatic tear of lateral meniscus of right knee     Comment:  right knee No date: Urinary frequency No date: Urinary incontinence   Reproductive/Obstetrics                             Anesthesia Physical Anesthesia Plan  ASA: III  Anesthesia Plan: MAC   Post-op Pain Management:    Induction: Intravenous  PONV Risk Score and Plan: 1 and Midazolam  Airway Management Planned: Natural Airway  Additional Equipment:   Intra-op Plan:   Post-operative Plan:   Informed Consent: I have reviewed the patients History and Physical, chart, labs and discussed the procedure including the risks, benefits and alternatives for the proposed anesthesia with the patient or authorized representative who has indicated his/her understanding and acceptance.     Plan Discussed with: CRNA and Anesthesiologist  Anesthesia Plan Comments:         Anesthesia Quick Evaluation

## 2018-09-13 NOTE — Transfer of Care (Signed)
  Immediate Anesthesia Transfer of Care Note  Patient: Eddie Carter  Procedure(s) Performed: CATARACT EXTRACTION PHACO AND INTRAOCULAR LENS PLACEMENT (IOC) (Left Eye)  Patient Location: PACU  Anesthesia Type:General  Level of Consciousness: awake, alert  and oriented  Airway & Oxygen Therapy: Patient Spontanous Breathing  Post-op Assessment: Report given to RN and Post -op Vital signs reviewed and stable  Post vital signs: Reviewed and stable  Last Vitals:  Vitals Value Taken Time  BP    Temp    Pulse    Resp    SpO2      Last Pain:  Vitals:   09/13/18 0747  TempSrc: Tympanic  PainSc: 0-No pain         Complications: No apparent anesthesia complications

## 2018-09-13 NOTE — Anesthesia Post-op Follow-up Note (Signed)
Anesthesia QCDR form completed.        

## 2018-09-13 NOTE — Anesthesia Procedure Notes (Signed)
Procedure Name: MAC Performed by: Cook-Martin, Theresa Dohrman Pre-anesthesia Checklist: Patient identified, Emergency Drugs available, Suction available, Patient being monitored and Timeout performed Patient Re-evaluated:Patient Re-evaluated prior to induction Oxygen Delivery Method: Nasal cannula Preoxygenation: Pre-oxygenation with 100% oxygen Induction Type: IV induction Placement Confirmation: positive ETCO2 and CO2 detector       

## 2018-10-05 ENCOUNTER — Other Ambulatory Visit: Payer: Self-pay | Admitting: Urology

## 2018-11-07 ENCOUNTER — Telehealth: Payer: Self-pay | Admitting: Urology

## 2018-11-07 NOTE — Telephone Encounter (Signed)
Pt wants to know if there are any alternative to Myrbetriq.  He is having trouble with incontinence.  Please give pt a call [phone]

## 2018-11-07 NOTE — Telephone Encounter (Signed)
Returned call to patient. Patient will call his insurance company regarding his formulary list and will call us back to request an alternative.

## 2018-11-10 ENCOUNTER — Ambulatory Visit (INDEPENDENT_AMBULATORY_CARE_PROVIDER_SITE_OTHER): Payer: Managed Care, Other (non HMO) | Admitting: Specialist

## 2018-11-21 ENCOUNTER — Ambulatory Visit (INDEPENDENT_AMBULATORY_CARE_PROVIDER_SITE_OTHER): Payer: Managed Care, Other (non HMO) | Admitting: Specialist

## 2018-11-21 ENCOUNTER — Telehealth (INDEPENDENT_AMBULATORY_CARE_PROVIDER_SITE_OTHER): Payer: Self-pay | Admitting: Specialist

## 2018-11-21 NOTE — Telephone Encounter (Signed)
Patient came in office requesting copy of records be faxed to himself (fax [phone])and to Dr. Barbette Reichmann in [place], [place], (fx [phone]), release signed. I faxed all Epic records

## 2018-12-05 ENCOUNTER — Other Ambulatory Visit: Payer: Self-pay | Admitting: Urology

## 2018-12-05 NOTE — Telephone Encounter (Signed)
Spoke to patient we will start PA for Toviaz. Patient states it is working.

## 2018-12-05 NOTE — Telephone Encounter (Signed)
Pt states that the Lisbeth Ply is working, he needs prior Josem Kaufmann so that he can get rx

## 2018-12-06 ENCOUNTER — Ambulatory Visit (INDEPENDENT_AMBULATORY_CARE_PROVIDER_SITE_OTHER): Payer: Managed Care, Other (non HMO) | Admitting: Specialist

## 2018-12-06 ENCOUNTER — Ambulatory Visit (INDEPENDENT_AMBULATORY_CARE_PROVIDER_SITE_OTHER): Payer: Managed Care, Other (non HMO)

## 2018-12-06 ENCOUNTER — Encounter (INDEPENDENT_AMBULATORY_CARE_PROVIDER_SITE_OTHER): Payer: Self-pay | Admitting: Specialist

## 2018-12-06 VITALS — BP 103/61 | HR 63 | Ht 72.0 in | Wt 295.0 lb

## 2018-12-06 DIAGNOSIS — R29898 Other symptoms and signs involving the musculoskeletal system: Secondary | ICD-10-CM

## 2018-12-06 DIAGNOSIS — M5136 Other intervertebral disc degeneration, lumbar region: Secondary | ICD-10-CM

## 2018-12-06 DIAGNOSIS — G8929 Other chronic pain: Secondary | ICD-10-CM | POA: Diagnosis not present

## 2018-12-06 DIAGNOSIS — S46011S Strain of muscle(s) and tendon(s) of the rotator cuff of right shoulder, sequela: Secondary | ICD-10-CM

## 2018-12-06 DIAGNOSIS — M4726 Other spondylosis with radiculopathy, lumbar region: Secondary | ICD-10-CM | POA: Diagnosis not present

## 2018-12-06 NOTE — Patient Instructions (Addendum)
Avoid bending, stooping and avoid lifting weights greater than 10 lbs. Avoid prolong standing and walking. Avoid frequent bending and stooping  No lifting greater than 10 lbs. May use ice or moist heat for pain. Weight loss is of benefit. Handicap license is approved. MRI with and without contrast ordered to assess for nerve compression due to worsening stenosis.

## 2018-12-06 NOTE — Progress Notes (Signed)
Office Visit Note   Patient: Eddie Carter           Date of Birth: 1940/12/26           MRN: [id number] Visit Date: 12/06/2018              Requested by: [name], MD [address] [place], [place] [address] PCP: [name], MD   Assessment & Plan: Visit Diagnoses:  1. Degenerative lumbar disc   2. Bilateral leg weakness   3. Other spondylosis with radiculopathy, lumbar region   4. Other chronic pain     Plan: Avoid bending, stooping and avoid lifting weights greater than 10 lbs. Avoid prolong standing and walking. Avoid frequent bending and stooping  No lifting greater than 10 lbs. May use ice or moist heat for pain. Weight loss is of benefit. Handicap license is approved.  MRI with and without contrast ordered to assess for nerve compression due to worsening stenosis.   Follow-Up Instructions: Return in about 4 weeks (around 01/03/2019).   Orders:  Orders Placed This Encounter  Procedures  . XR Lumbar Spine 2-3 Views   No orders of the defined types were placed in this encounter.     Procedures: No procedures performed   Clinical Data: No additional findings.   Subjective: Chief Complaint  Patient presents with  . Spine - Pain, Follow-up    78 year old male with history of L3-4 microdiscectomy x 2 for HNP and recurrent HNP with left knee quadriceps weakness. He is trying to improve his standing and walking tolerance. He reports walking from one end of the house to the other about 40 yards with walking sticks. He reports that his balance is poor. He has no neck pain. No mid back pain. He had a bad fall about 2 years ago with a right RCT and right knee injury with sprain.  History of arthroscopic surgery 5-6 year ago by Dr. Noemi Chapel. Falling and injuring his shoulder he did not have rotator cuff surgery.    Review of Systems  Constitutional: Negative.   HENT: Negative.   Eyes: Negative.   Respiratory: Negative.   Cardiovascular: Negative.     Gastrointestinal: Negative.   Endocrine: Negative.   Genitourinary: Negative.   Musculoskeletal: Negative.   Skin: Negative.   Allergic/Immunologic: Negative.   Neurological: Negative.   Hematological: Negative.   Psychiatric/Behavioral: Negative.      Objective: Vital Signs: Ht 6' (1.829 m)   Wt 295 lb (133.8 kg)   BMI 40.01 kg/m   Physical Exam Constitutional:      Appearance: He is well-developed.  HENT:     Head: Normocephalic and atraumatic.  Eyes:     Pupils: Pupils are equal, round, and reactive to light.  Neck:     Musculoskeletal: Normal range of motion and neck supple.  Pulmonary:     Effort: Pulmonary effort is normal.     Breath sounds: Normal breath sounds.  Abdominal:     General: Bowel sounds are normal.     Palpations: Abdomen is soft.  Musculoskeletal: Normal range of motion.  Skin:    General: Skin is warm and dry.  Neurological:     Mental Status: He is alert and oriented to person, place, and time.  Psychiatric:        Behavior: Behavior normal.        Thought Content: Thought content normal.        Judgment: Judgment normal.  Back Exam   Tenderness  The patient is experiencing tenderness in the lumbar.  Muscle Strength  Right Quadriceps:  4/5  Left Quadriceps:  5/5  Right Hamstrings:  5/5  Left Hamstrings:  5/5   Comments:  Hip flexion right side is weak. Left is normal.       Specialty Comments:  No specialty comments available.  Imaging: Xr Lumbar Spine 2-3 Views  Result Date: 12/06/2018 AP and lateral radiographs of the lumbar spine with DDD L3-4 greater than other levels, minimal curvature <10 degrees apex to the left. There are multiple lateral osteophytes left L4-5 and bilateral L3-4. Previous MRI from 06/2017.     PMFS History: Patient Active Problem List   Diagnosis Date Noted  . Closed compression fracture of L1 lumbar vertebra 07/15/2017  . Back pain 07/15/2017  . Aspiration into airway 07/15/2017  .  Wheezing 07/15/2017  . Leukocytosis 07/15/2017  . Bilateral calf pain 07/15/2017  . Low back pain 07/13/2017  . Cephalalgia 01/29/2016  . Adiposity 01/29/2016  . Hypertriglyceridemia 01/29/2016  . H/O respiratory system disease 01/29/2016  . H/O erectile dysfunction 01/29/2016  . Cerebrovascular accident, old 01/29/2016  . Diabetes mellitus ([place]) 01/29/2016  . Chronic obstructive pulmonary disease ([place]) 01/29/2016  . Benign fibroma of prostate 01/29/2016  . Chronic pain 01/29/2016  . 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 Abnormal MRI, lumbar spine 01/29/2016  . Chronic low back pain (Location of Primary Source of Pain) (Bilateral) (L>R) 01/29/2016  . Chronic lower extremity pain (Location of Secondary source of pain) (Bilateral) (L>R) 01/29/2016  . Long term current use of opiate analgesic 01/29/2016  . Long term prescription opiate use 01/29/2016  . Opiate use 01/29/2016  . Encounter for therapeutic drug level monitoring 01/29/2016  . Encounter for pain management planning 01/29/2016  . Chronic lumbar radicular pain (Location of Secondary source of pain) (Bilateral) (L4 Dermatome) (L>R) 01/29/2016  . Chronic hip pain (Location of Tertiary source of pain) (Bilateral) (L>R) 01/29/2016  . Chronic knee pain (Bilateral) (L>R) 01/29/2016  . Chronic anticoagulation (Plavix) 01/29/2016  . Erectile dysfunction of organic origin 01/22/2016  . BPH with obstruction/lower urinary tract symptoms 07/24/2015  . Hypogonadism in male 07/24/2015  . HNP (herniated nucleus pulposus), lumbar 11/05/2014    Class: Acute  . Herniated nucleus pulposus, lumbar 11/05/2014  . Pulmonary nodules 10/25/2013  . ILD (interstitial lung disease) ([place]) 08/22/2013  . Traumatic tear of lateral meniscus of right knee   . Medial meniscus, posterior horn derangement   . COPD (chronic obstructive pulmonary disease) ([place])   . Shortness of breath   . Sleep apnea   . Stroke ([place])   . Arthritis   . GERD  (gastroesophageal reflux disease)   . History of GI bleed   . HOH (hard of hearing)   . Full dentures   . Complication of anesthesia    Past Medical History:  Diagnosis Date  . Arthritis   . Asthma   . BPH (benign prostatic hyperplasia)   . Cancer ([place])    face- basal cell  . Complication of anesthesia    after septoplasty and uvulectomy-was in icu- [facility] - 10 yrs. ago  . COPD (chronic obstructive pulmonary disease) ([place])   . Depression   . Diabetes mellitus without complication ([place])   . ED (erectile dysfunction)   . Full dentures   . GERD (gastroesophageal reflux disease)    no meds  . Headache    difficulty with headaches- 1950's , again in  1967, 2008- again problems with migrraines   . History of GI bleed   . History of urethral stricture   . HOH (hard of hearing)   . HOH (hard of hearing)   . Hypogonadism in male   . Joint pain   . Medial meniscus, posterior horn derangement    left knee  . Obesity   . Pneumonia 2014   [facility]  . Shortness of breath   . Skin cancer   . Sleep apnea    uses a cpap, most nights   . Stroke [facility]) 1995   some speech and thought processes slow  . Traumatic tear of lateral meniscus of right knee    right knee  . Urinary frequency   . Urinary incontinence     Family History  Problem Relation Age of Onset  . Cancer Mother   . Heart attack Father   . Heart disease Unknown   . Arthritis Unknown   . Prostate cancer Neg Hx   . Kidney disease Neg Hx     Past Surgical History:  Procedure Laterality Date  . APPENDECTOMY  1963  . CARPAL TUNNEL RELEASE     left  . CATARACT EXTRACTION W/PHACO Left 09/13/2018   Procedure: CATARACT EXTRACTION PHACO AND INTRAOCULAR LENS PLACEMENT (IOC);  Surgeon: Birder Robson, MD;  Location: [facility] ORS;  Service: Ophthalmology;  Laterality: Left;  [place]  00:42 CDE 6.73 Fluid pack lot #@ [id number]  . COLONOSCOPY    . COLONOSCOPY WITH PROPOFOL N/A 12/07/2016   Procedure: COLONOSCOPY WITH PROPOFOL;   Surgeon: Lollie Sails, MD;  Location: [facility] ENDOSCOPY;  Service: Endoscopy;  Laterality: N/A;  . EYE SURGERY     foreign body removed fr. eye, ? side   . FOOT FOREIGN BODY REMOVAL  1976   left foot -multiple pieces glass  . HEMORROIDECTOMY  1995  . KNEE ARTHROSCOPY Bilateral 01/17/2013   Procedure: ARTHROSCOPY KNEE BILATERAL WITH MEDIAL AND LATERAL MENISECTOMIES;  Surgeon: Lorn Junes, MD;  Location: [place];  Service: Orthopedics;  Laterality: Bilateral;  . KYPHOPLASTY N/A 07/23/2017   Procedure: KYPHOPLASTY- L1;  Surgeon: Hessie Knows, MD;  Location: [facility] ORS;  Service: Orthopedics;  Laterality: N/A;  . LUMBAR LAMINECTOMY N/A 11/05/2014   Procedure: LEFT L3-4 MICRODISCECTOMY;  Surgeon: Jessy Oto, MD;  Location: [facility];  Service: Orthopedics;  Laterality: N/A;  . LUMBAR LAMINECTOMY/DECOMPRESSION MICRODISCECTOMY N/A 02/12/2015   Procedure: RE-DO LEFT L3-4 MICRODISCECTOMY;  Surgeon: Jessy Oto, MD;  Location: [facility];  Service: Orthopedics;  Laterality: N/A;  . NASAL SEPTUM SURGERY  1995  . TONSILLECTOMY    . UPPER GI ENDOSCOPY    . UVULOPALATOPHARYNGOPLASTY (UPPP)/TONSILLECTOMY/SEPTOPLASTY  1995   same time with septoplasty-ended up ICU almost trached   Social History   Occupational History    Comment: register of deeds  Tobacco Use  . Smoking status: Former Smoker    Packs/day: 1.50    Years: 40.00    Pack years: 60.00    Types: Cigarettes    Last attempt to quit: 01/14/1996    Years since quitting: 22.9  . Smokeless tobacco: Never Used  Substance and Sexual Activity  . Alcohol use: No    Comment: wine in a month  . Drug use: No  . Sexual activity: Never

## 2018-12-07 ENCOUNTER — Other Ambulatory Visit: Payer: Self-pay

## 2018-12-07 NOTE — Telephone Encounter (Signed)
Received and submitted PA request for Toviaz.

## 2018-12-08 ENCOUNTER — Telehealth: Payer: Self-pay | Admitting: Urology

## 2018-12-08 NOTE — Telephone Encounter (Signed)
Eddie Carter from [facility] left vm and wants to speak to someone about a PA and requests a call back at [phone] Thanks, Rosann Auerbach

## 2018-12-08 NOTE — Telephone Encounter (Signed)
Pt left vm asking if request had been sent for his med PA. Thanks, Rosann Auerbach

## 2018-12-09 ENCOUNTER — Telehealth: Payer: Self-pay | Admitting: Urology

## 2018-12-09 NOTE — Telephone Encounter (Signed)
Please call Mr Esselman back about PA.  He called and wants to talk to someone in clinical staff. Thanks, Rosann Auerbach

## 2018-12-09 NOTE — Telephone Encounter (Signed)
Spoke to patient and informed him we have resubmitted the PA request for the 2nd time today. Patient voiced understanding.

## 2018-12-13 ENCOUNTER — Other Ambulatory Visit: Payer: Self-pay

## 2018-12-13 DIAGNOSIS — N3946 Mixed incontinence: Secondary | ICD-10-CM

## 2018-12-13 MED ORDER — FESOTERODINE FUMARATE ER 8 MG PO TB24: 8.0000 mg | ORAL_TABLET | Freq: Every day | ORAL | 3 refills | Status: DC

## 2018-12-14 ENCOUNTER — Telehealth: Payer: Self-pay

## 2018-12-14 NOTE — Telephone Encounter (Signed)
Pt called and states that [facility] may be a better option. Left # for [place] to call. Nurse Line # [phone]

## 2018-12-14 NOTE — Telephone Encounter (Signed)
Prior authorization initiated for Toviaz via cover my meds.  Response was wating on [employer].

## 2018-12-14 NOTE — Telephone Encounter (Signed)
Pt called back again and wants to know why his medication has not been called in and wants a call back.

## 2018-12-14 NOTE — Telephone Encounter (Signed)
Spoke with patient and advised him that [place] prior authorization was initiated.  Samples given.  Additional information faxed to [employer] at [phone].

## 2018-12-16 NOTE — Telephone Encounter (Signed)
[employer] called pt last night to let him know that Lisbeth Ply had been approved.

## 2018-12-18 ENCOUNTER — Ambulatory Visit
Admission: RE | Admit: 2018-12-18 | Discharge: 2018-12-18 | Disposition: A | Discharge status: Home / Self Care | Payer: Managed Care, Other (non HMO) | Source: Ambulatory Visit | Attending: Specialist | Admitting: Specialist

## 2018-12-18 DIAGNOSIS — G8929 Other chronic pain: Secondary | ICD-10-CM

## 2018-12-18 DIAGNOSIS — R29898 Other symptoms and signs involving the musculoskeletal system: Secondary | ICD-10-CM

## 2018-12-18 DIAGNOSIS — M4726 Other spondylosis with radiculopathy, lumbar region: Secondary | ICD-10-CM

## 2018-12-18 MED ORDER — GADOBENATE DIMEGLUMINE 529 MG/ML IV SOLN
20.0000 mL | Freq: Once | INTRAVENOUS | Status: AC | PRN
  Administered 2018-12-18: 20 mL via INTRAVENOUS

## 2018-12-20 ENCOUNTER — Telehealth: Payer: Self-pay

## 2018-12-20 NOTE — Telephone Encounter (Signed)
Patient has been denied Toviaz.  Insurance requires patient try and fail enables, detrol, oxybutynin and trospium.  Patient says he has had great success with Toviaz.  Please advise what should be done.

## 2018-12-21 NOTE — Telephone Encounter (Signed)
Detrol LA 4 mg  30x11

## 2018-12-21 NOTE — Telephone Encounter (Signed)
Per patient [employer] approved toviaz.

## 2018-12-26 ENCOUNTER — Ambulatory Visit (INDEPENDENT_AMBULATORY_CARE_PROVIDER_SITE_OTHER): Payer: Managed Care, Other (non HMO) | Admitting: Urology

## 2018-12-26 ENCOUNTER — Encounter: Payer: Self-pay | Admitting: Urology

## 2018-12-26 DIAGNOSIS — N3946 Mixed incontinence: Secondary | ICD-10-CM

## 2018-12-26 MED ORDER — FESOTERODINE FUMARATE ER 8 MG PO TB24: 8.0000 mg | ORAL_TABLET | Freq: Every day | ORAL | 3 refills | Status: DC

## 2018-12-26 NOTE — Progress Notes (Signed)
12/26/2018 1:53 PM   Eddie Carter [birth year] [id number]   Referring provider: [name], MD [address] [place], [place] [address]  Chief Complaint  Patient presents with  . Urinary Incontinence    HPI: [place]:He is currently on dutasteride 0.5 mg, Myrbetriq 50 mg, tamsulosin 0.4 mg and Vesicare 10 mg. He states that his incontinence is "awful." His has had an urethral stricture that had been dilated in the remote past.  Today Patient still is on multimodality therapy. He still has urgency incontinence and he can leak with almost no warning. He dates it back to an injury 3 years ago. He said 3 back surgeries. He is in a wheelchair. He has minimal nocturia and frequency.  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 He does not have a stricture. The role of Toviaz and stopthe Vesicare was discussed. I do not think he would be a good candidate for InterStim or Botox. Percutaneous tibial nerve stimulation is an option. Does need MRIs he does have a functional component. I do not think urodynamics will change any treatment options  StopVesicare. Add Toviaz 8 mg to polypharmacy and reassess 5 weeks possible percutaneous tibial nerve stimulation  She dramatically better on Toviaz in combination with Flomax and Myrbetriq. He does not know if he still taking the Vesicare. I wrote down to stop it. We can go back on it of course if needed but that would be a lot of medication. If so I would then stop the Myrbetriq and/or Flomax to get him off some of the medications.     Today Goal today was to stop at least 1 of the medications. Patient is almost completely dry on Toviaz and Flomax.  Frequency stable.  No infections.  He is delighted.  Finally insurance covers it   PMH: Past Medical History:  Diagnosis Date  . Arthritis   . Asthma   . BPH (benign prostatic hyperplasia)   . Cancer ([place])    face-  basal cell  . Complication of anesthesia    after septoplasty and uvulectomy-was in icu- [facility] - 10 yrs. ago  . COPD (chronic obstructive pulmonary disease) ([place])   . Depression   . Diabetes mellitus without complication ([place])   . ED (erectile dysfunction)   . Full dentures   . GERD (gastroesophageal reflux disease)    no meds  . Headache    difficulty with headaches- 1950's , again in 1967, 2008- again problems with migrraines   . History of GI bleed   . History of urethral stricture   . HOH (hard of hearing)   . HOH (hard of hearing)   . Hypogonadism in male   . Joint pain   . Medial meniscus, posterior horn derangement    left knee  . Obesity   . Pneumonia 2014   [facility]  . Shortness of breath   . Skin cancer   . Sleep apnea    uses a cpap, most nights   . Stroke [facility]) 1995   some speech and thought processes slow  . Traumatic tear of lateral meniscus of right knee    right knee  . Urinary frequency   . Urinary incontinence     Surgical History: Past Surgical History:  Procedure Laterality Date  . APPENDECTOMY  1963  . CARPAL TUNNEL RELEASE     left  . CATARACT EXTRACTION W/PHACO Left 09/13/2018   Procedure: CATARACT EXTRACTION PHACO AND INTRAOCULAR LENS PLACEMENT (  [place]);  Surgeon: Birder Robson, MD;  Location: [facility] ORS;  Service: Ophthalmology;  Laterality: Left;  [place]  00:42 CDE 6.73 Fluid pack lot #[id number]  . COLONOSCOPY    . COLONOSCOPY WITH PROPOFOL N/A 12/07/2016   Procedure: COLONOSCOPY WITH PROPOFOL;  Surgeon: Lollie Sails, MD;  Location: [facility] ENDOSCOPY;  Service: Endoscopy;  Laterality: N/A;  . EYE SURGERY     foreign body removed fr. eye, ? side   . FOOT FOREIGN BODY REMOVAL  1976   left foot -multiple pieces glass  . HEMORROIDECTOMY  1995  . KNEE ARTHROSCOPY Bilateral 01/17/2013   Procedure: ARTHROSCOPY KNEE BILATERAL WITH MEDIAL AND LATERAL MENISECTOMIES;  Surgeon: Lorn Junes, MD;  Location: [place];  Service:  Orthopedics;  Laterality: Bilateral;  . KYPHOPLASTY N/A 07/23/2017   Procedure: KYPHOPLASTY- L1;  Surgeon: Hessie Knows, MD;  Location: [facility] ORS;  Service: Orthopedics;  Laterality: N/A;  . LUMBAR LAMINECTOMY N/A 11/05/2014   Procedure: LEFT L3-4 MICRODISCECTOMY;  Surgeon: Jessy Oto, MD;  Location: [place];  Service: Orthopedics;  Laterality: N/A;  . LUMBAR LAMINECTOMY/DECOMPRESSION MICRODISCECTOMY N/A 02/12/2015   Procedure: RE-DO LEFT L3-4 MICRODISCECTOMY;  Surgeon: Jessy Oto, MD;  Location: [place];  Service: Orthopedics;  Laterality: N/A;  . NASAL SEPTUM SURGERY  1995  . TONSILLECTOMY    . UPPER GI ENDOSCOPY    . UVULOPALATOPHARYNGOPLASTY (UPPP)/TONSILLECTOMY/SEPTOPLASTY  1995   same time with septoplasty-ended up ICU almost trached    Home Medications:  Allergies as of 12/26/2018      Reactions   Asa [aspirin] Shortness Of Breath   Bee Venom Anaphylaxis   Nsaids Other (See Comments)   Makes him bleed.   Tolmetin Other (See Comments)   Makes him bleed.      Medication List       Accurate as of December 26, 2018  1:53 PM. Always use your most recent med list.        acetaminophen 500 MG tablet Commonly known as:  TYLENOL Take 500 mg by mouth every 8 (eight) hours as needed.   albuterol 108 (90 Base) MCG/ACT inhaler Commonly known as:  PROVENTIL HFA;VENTOLIN HFA Inhale into the lungs.   ALCORTIN A 1-2-1 % Gel Apply topically.   ANDROGEL PUMP 20.25 MG/ACT (1.62%) Gel Generic drug:  Testosterone AndroGel 20.25 mg/1.25 gram (1.62 %) transdermal gel pump   budesonide-formoterol 160-4.5 MCG/ACT inhaler Commonly known as:  SYMBICORT Inhale 2 puffs into the lungs 2 (two) times daily.   buPROPion 150 MG 24 hr tablet Commonly known as:  WELLBUTRIN XL bupropion HCl XL 150 mg 24 hr tablet, extended release   CINNAMON PO Take 4,000 mg by mouth daily at 2 PM.   clopidogrel 75 MG tablet Commonly known as:  PLAVIX clopidogrel 75 mg tablet   Co Q-10 100 MG Caps Take 1  tablet by mouth at bedtime.   donepezil 10 MG tablet Commonly known as:  ARICEPT Take 10 mg by mouth at bedtime.   DUREZOL 0.05 % Emul Generic drug:  Difluprednate   dutasteride 0.5 MG capsule Commonly known as:  AVODART Take 1 capsule (0.5 mg total) by mouth daily.   EPINEPHrine 0.3 mg/0.3 mL Soaj injection Commonly known as:  EPI-PEN   fesoterodine 8 MG Tb24 tablet Commonly known as:  TOVIAZ Take 1 tablet (8 mg total) by mouth daily.   fluticasone 50 MCG/ACT nasal spray Commonly known as:  FLONASE Place 2 sprays into both nostrils daily.   furosemide 20 MG  tablet Commonly known as:  LASIX furosemide 20 mg tablet   gabapentin 100 MG capsule Commonly known as:  NEURONTIN Take 100 mg twice a day for one week, then increase to 200 mg(2 tablets) twice a day and continue   HYDROcodone-acetaminophen 5-325 MG tablet Commonly known as:  NORCO/VICODIN hydrocodone 5 mg-acetaminophen 325 mg tablet   hydrocortisone 2.5 % lotion   hydrOXYzine 10 MG tablet Commonly known as:  ATARAX/VISTARIL Take 2 tablets by mouth at bedtime.   INCRUSE ELLIPTA 62.5 MCG/INH Aepb Generic drug:  umeclidinium bromide   levothyroxine 50 MCG tablet Commonly known as:  SYNTHROID, LEVOTHROID Take 50 mcg by mouth daily. Take on an empty stomach with a glass of water at least 30-60 minutes before breakfast.   lidocaine 5 % Commonly known as:  LIDODERM   LINZESS 145 MCG Caps capsule Generic drug:  linaclotide Linzess 145 mcg capsule   LYRICA 50 MG capsule Generic drug:  pregabalin Lyrica 50 mg capsule   Magnesium Citrate 100 MG Tabs Take by mouth.   mirabegron ER 50 MG Tb24 tablet Commonly known as:  MYRBETRIQ Take 1 tablet (50 mg total) by mouth daily.   MYRBETRIQ 25 MG Tb24 tablet Generic drug:  mirabegron ER TAKE 1 TABLET BY MOUTH DAILY   montelukast 10 MG tablet Commonly known as:  SINGULAIR montelukast 10 mg tablet   MULTI-VITAMINS Tabs Take by mouth.   polyethylene glycol  packet Commonly known as:  MIRALAX / GLYCOLAX Take 17 g by mouth daily as needed. Reported on 01/21/2016   RA KRILL OIL 500 MG Caps Take by mouth.   solifenacin 10 MG tablet Commonly known as:  VESICARE Take 1 tablet (10 mg total) by mouth daily.   tamsulosin 0.4 MG Caps capsule Commonly known as:  FLOMAX Take 1 capsule (0.4 mg total) by mouth daily.   tiotropium 18 MCG inhalation capsule Commonly known as:  SPIRIVA Place 1 capsule (18 mcg total) into inhaler and inhale daily.   topiramate 200 MG tablet Commonly known as:  TOPAMAX Take 1 tablet by mouth daily.   traMADol 50 MG tablet Commonly known as:  ULTRAM Take 1 tablet (50 mg total) by mouth every 6 (six) hours as needed for moderate pain.   UNABLE TO FIND Med Name: Focus Factor   Vitamin D 1000 units capsule Take 3,000 Units by mouth daily.       Allergies:  Allergies  Allergen Reactions  . Asa [Aspirin] Shortness Of Breath  . Bee Venom Anaphylaxis  . Nsaids Other (See Comments)    Makes him bleed.  . Tolmetin Other (See Comments)    Makes him bleed.    Family History: Family History  Problem Relation Age of Onset  . Cancer Mother   . Heart attack Father   . Heart disease Unknown   . Arthritis Unknown   . Prostate cancer Neg Hx   . Kidney disease Neg Hx     Social History:  reports that he quit smoking about 22 years ago. His smoking use included cigarettes. He has a 60.00 pack-year smoking history. He has never used smokeless tobacco. He reports that he does not drink alcohol or use drugs.  ROS: UROLOGY Frequent Urination?: No Hard to postpone urination?: No Burning/pain with urination?: No Get up at night to urinate?: No Leakage of urine?: No Urine stream starts and stops?: No Trouble starting stream?: No Do you have to strain to urinate?: No Blood in urine?: No Urinary tract infection?: No Sexually transmitted  disease?: No Injury to kidneys or bladder?: No Painful intercourse?:  No Weak stream?: No Erection problems?: Yes Penile pain?: No  Gastrointestinal Nausea?: No Vomiting?: No Indigestion/heartburn?: No Diarrhea?: No Constipation?: No  Constitutional Fever: No Night sweats?: No Weight loss?: No Fatigue?: No  Skin Skin rash/lesions?: No Itching?: No  Eyes Blurred vision?: No Double vision?: No  Ears/Nose/Throat Sore throat?: No Sinus problems?: No  Hematologic/Lymphatic Swollen glands?: No Easy bruising?: No  Cardiovascular Leg swelling?: No Chest pain?: No  Respiratory Cough?: No Shortness of breath?: No  Endocrine Excessive thirst?: No  Musculoskeletal Back pain?: No Joint pain?: No  Neurological Headaches?: No Dizziness?: No  Psychologic Depression?: No Anxiety?: No  Physical Exam: BP (!) 145/71 (BP Location: Left Arm, Patient Position: Sitting)   Pulse 87   Ht 6' (1.829 m)   BMI 40.01 kg/m   Constitutional:  Alert and oriented, No acute distress. HEENT: [place] AT, moist mucus membranes.  Trachea midline, no masses. Cardiovascular: No clubbing, cyanosis, or edema. Respiratory: Normal respiratory effort, no increased work of breathing. GI: Abdomen is soft, nontender, nondistended, no abdominal masses GU: No CVA tenderness.  Skin: No rashes, bruises or suspicious lesions. Lymph: No cervical or inguinal adenopathy. Neurologic: Grossly intact, no focal deficits, moving all 4 extremities. Psychiatric: Normal mood and affect.  Laboratory Data: Lab Results  Component Value Date   WBC 12.0 (H) 07/15/2017   HGB 13.8 07/15/2017   HCT 40.6 07/15/2017   MCV 91.9 07/15/2017   PLT 163 07/15/2017    Lab Results  Component Value Date   CREATININE 0.87 07/13/2017    No results found for: PSA  Lab Results  Component Value Date   TESTOSTERONE 194 (L) 01/16/2016    Lab Results  Component Value Date   HGBA1C 5.7 (H) 05/31/2017    Urinalysis    Component Value Date/Time   COLORURINE YELLOW (A) 07/15/2017  0436   APPEARANCEUR Clear 05/09/2018 1547   LABSPEC 1.015 07/15/2017 0436   LABSPEC 1.012 07/25/2013 1646   PHURINE 6.0 07/15/2017 0436   GLUCOSEU Negative 05/09/2018 1547   GLUCOSEU Negative 07/25/2013 1646   HGBUR NEGATIVE 07/15/2017 0436   BILIRUBINUR Negative 05/09/2018 1547   BILIRUBINUR Negative 07/25/2013 1646   KETONESUR NEGATIVE 07/15/2017 0436   PROTEINUR Negative 05/09/2018 1547   PROTEINUR NEGATIVE 07/15/2017 0436   NITRITE Negative 05/09/2018 1547   NITRITE NEGATIVE 07/15/2017 0436   LEUKOCYTESUR Negative 05/09/2018 1547   LEUKOCYTESUR Negative 07/25/2013 1646    Pertinent Imaging:   Assessment & Plan: 903 sent to pharmacy samples given.  See in 1 year  There are no diagnoses linked to this encounter.  No follow-ups on file.  Reece Packer, MD  [facility] [address] [place], [place] [address] [phone]

## 2018-12-27 ENCOUNTER — Telehealth: Payer: Self-pay

## 2018-12-27 NOTE — Telephone Encounter (Signed)
Incoming fax from [facility] stating that the patient's rx for Lisbeth Ply has been denied after review and prior auth. Denial scanned for documentation.

## 2019-01-05 ENCOUNTER — Encounter (INDEPENDENT_AMBULATORY_CARE_PROVIDER_SITE_OTHER): Payer: Self-pay | Admitting: Specialist

## 2019-01-05 ENCOUNTER — Ambulatory Visit (INDEPENDENT_AMBULATORY_CARE_PROVIDER_SITE_OTHER): Payer: Managed Care, Other (non HMO) | Admitting: Specialist

## 2019-01-05 VITALS — BP 109/64 | HR 67 | Ht 72.0 in | Wt 295.0 lb

## 2019-01-05 DIAGNOSIS — M5126 Other intervertebral disc displacement, lumbar region: Secondary | ICD-10-CM

## 2019-01-05 NOTE — Patient Instructions (Signed)
Weight loss is of benefit. Exercise is important to improve your indurance and does allow people to function better inspite of back pain. Avoid bending, stooping and avoid lifting weights greater than 10 lbs.. May use ice or moist heat for pain. Weight loss is of benefit. Handicap license is approved. Dr. Romona Curls secretary/Assistant will call to arrange for epidural steroid injection

## 2019-01-05 NOTE — Progress Notes (Signed)
Office Visit Note   Patient: Eddie Carter           Date of Birth: 1940-12-28           MRN: [id number] Visit Date: 01/05/2019              Requested by: [name], MD [address] [place], [place] [address] PCP: [name], MD   Assessment & Plan: Visit Diagnoses:  1. 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 Exercise is important to improve your indurance and does allow people to function better inspite of back pain. Avoid bending, stooping and avoid lifting weights greater than 10 lbs.. May use ice or moist heat for pain. Weight loss is of benefit. Handicap license is approved. Dr. Romona Curls secretary/Assistant will call to arrange for epidural steroid injection    Follow-Up Instructions: Return in about 4 weeks (around 02/02/2019) for overbook first afternoon appt spot on Thurs in one month..   Orders:  Orders Placed This Encounter  Procedures  . Ambulatory referral to Physical Medicine Rehab   No orders of the defined types were placed in this encounter.     Procedures: No procedures performed   Clinical Data: No additional findings.   Subjective: Chief Complaint  Patient presents with  . Lower Back - Follow-up  . Right Shoulder - Follow-up    Avoid frequent bending and stooping  No lifting greater than 10 lbs. May use ice or moist heat for pain. Weight loss is of benefit. Best medication for lumbar disc disease is arthritis medications like motrin, celebrex and naprosyn. Exercise is important to improve your indurance and does allow people to function better inspite of back pain.     Review of Systems  Constitutional: Negative.   HENT: Negative.   Eyes: Negative.   Respiratory: Negative.   Cardiovascular: Negative.   Gastrointestinal: Negative.   Endocrine: Negative.   Genitourinary: Negative.   Musculoskeletal: Negative.   Skin: Negative.   Allergic/Immunologic: Negative.     Neurological: Negative.   Hematological: Negative.   Psychiatric/Behavioral: Negative.      Objective: Vital Signs: BP 109/64 (BP Location: Left Arm, Patient Position: Sitting)   Pulse 67   Ht 6' (1.829 m)   Wt 295 lb (133.8 kg)   BMI 40.01 kg/m   Physical Exam Constitutional:      Appearance: He is well-developed.  HENT:     Head: Normocephalic and atraumatic.  Eyes:     Pupils: Pupils are equal, round, and reactive to light.  Neck:     Musculoskeletal: Normal range of motion and neck supple.  Pulmonary:     Effort: Pulmonary effort is normal.     Breath sounds: Normal breath sounds.  Abdominal:     General: Bowel sounds are normal.     Palpations: Abdomen is soft.  Skin:    General: Skin is warm and dry.  Neurological:     Mental Status: He is alert and oriented to person, place, and time.  Psychiatric:        Behavior: Behavior normal.        Thought Content: Thought content normal.        Judgment: Judgment normal.     Back Exam   Tenderness  The patient is experiencing tenderness in the lumbar.  Range of Motion  Extension: abnormal  Flexion: normal  Lateral bend right: normal  Lateral bend left: normal  Rotation right:  normal  Rotation left: normal   Muscle Strength  Right Quadriceps:  4/5  Left Quadriceps:  5/5  Right Hamstrings:  4/5  Left Hamstrings:  5/5   Tests  Straight leg raise right: positive Straight leg raise left: negative  Reflexes  Patellar: normal Achilles: normal Babinski's sign: normal   Other  Toe walk: abnormal Heel walk: abnormal Sensation: normal Gait: normal  Erythema: no back redness Scars: absent      Specialty Comments:  No specialty comments available.  Imaging: No results found.   PMFS History: Patient Active Problem List   Diagnosis Date Noted  . Closed compression fracture of L1 lumbar vertebra 07/15/2017  . Back pain 07/15/2017  . Aspiration into airway 07/15/2017  . Wheezing 07/15/2017   . Leukocytosis 07/15/2017  . Bilateral calf pain 07/15/2017  . Low back pain 07/13/2017  . Cephalalgia 01/29/2016  . Adiposity 01/29/2016  . Hypertriglyceridemia 01/29/2016  . H/O respiratory system disease 01/29/2016  . H/O erectile dysfunction 01/29/2016  . Cerebrovascular accident, old 01/29/2016  . Diabetes mellitus ([place]) 01/29/2016  . Chronic obstructive pulmonary disease ([place]) 01/29/2016  . Benign fibroma of prostate 01/29/2016  . Chronic pain 01/29/2016  . 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 Abnormal MRI, lumbar spine 01/29/2016  . Chronic low back pain (Location of Primary Source of Pain) (Bilateral) (L>R) 01/29/2016  . Chronic lower extremity pain (Location of Secondary source of pain) (Bilateral) (L>R) 01/29/2016  . Long term current use of opiate analgesic 01/29/2016  . Long term prescription opiate use 01/29/2016  . Opiate use 01/29/2016  . Encounter for therapeutic drug level monitoring 01/29/2016  . Encounter for pain management planning 01/29/2016  . Chronic lumbar radicular pain (Location of Secondary source of pain) (Bilateral) (L4 Dermatome) (L>R) 01/29/2016  . Chronic hip pain (Location of Tertiary source of pain) (Bilateral) (L>R) 01/29/2016  . Chronic knee pain (Bilateral) (L>R) 01/29/2016  . Chronic anticoagulation (Plavix) 01/29/2016  . Erectile dysfunction of organic origin 01/22/2016  . BPH with obstruction/lower urinary tract symptoms 07/24/2015  . Hypogonadism in male 07/24/2015  . HNP (herniated nucleus pulposus), lumbar 11/05/2014    Class: Acute  . Herniated nucleus pulposus, lumbar 11/05/2014  . Pulmonary nodules 10/25/2013  . ILD (interstitial lung disease) ([place]) 08/22/2013  . Traumatic tear of lateral meniscus of right knee   . Medial meniscus, posterior horn derangement   . COPD (chronic obstructive pulmonary disease) ([place])   . Shortness of breath   . Sleep apnea   . Stroke ([place])   . Arthritis   . GERD (gastroesophageal reflux  disease)   . History of GI bleed   . HOH (hard of hearing)   . Full dentures   . Complication of anesthesia    Past Medical History:  Diagnosis Date  . Arthritis   . Asthma   . BPH (benign prostatic hyperplasia)   . Cancer ([place])    face- basal cell  . Complication of anesthesia    after septoplasty and uvulectomy-was in icu- [facility] - 10 yrs. ago  . COPD (chronic obstructive pulmonary disease) ([place])   . Depression   . Diabetes mellitus without complication ([place])   . ED (erectile dysfunction)   . Full dentures   . GERD (gastroesophageal reflux disease)    no meds  . Headache    difficulty with headaches- 1950's , again in 1967, 2008- again problems with migrraines   . History of GI bleed   . History of urethral stricture   .  HOH (hard of hearing)   . HOH (hard of hearing)   . Hypogonadism in male   . Joint pain   . Medial meniscus, posterior horn derangement    left knee  . Obesity   . Pneumonia 2014   [facility]  . Shortness of breath   . Skin cancer   . Sleep apnea    uses a cpap, most nights   . Stroke [facility]) 1995   some speech and thought processes slow  . Traumatic tear of lateral meniscus of right knee    right knee  . Urinary frequency   . Urinary incontinence     Family History  Problem Relation Age of Onset  . Cancer Mother   . Heart attack Father   . Heart disease Unknown   . Arthritis Unknown   . Prostate cancer Neg Hx   . Kidney disease Neg Hx     Past Surgical History:  Procedure Laterality Date  . APPENDECTOMY  1963  . CARPAL TUNNEL RELEASE     left  . CATARACT EXTRACTION W/PHACO Left 09/13/2018   Procedure: CATARACT EXTRACTION PHACO AND INTRAOCULAR LENS PLACEMENT ([facility]);  Surgeon: Birder Robson, MD;  Location: [facility] ORS;  Service: Ophthalmology;  Laterality: Left;  [place]  00:42 CDE 6.73 Fluid pack lot #@ [id number]  . COLONOSCOPY    . COLONOSCOPY WITH PROPOFOL N/A 12/07/2016   Procedure: COLONOSCOPY WITH PROPOFOL;  Surgeon: Lollie Sails, MD;  Location: [facility] ENDOSCOPY;  Service: Endoscopy;  Laterality: N/A;  . EYE SURGERY     foreign body removed fr. eye, ? side   . FOOT FOREIGN BODY REMOVAL  1976   left foot -multiple pieces glass  . HEMORROIDECTOMY  1995  . KNEE ARTHROSCOPY Bilateral 01/17/2013   Procedure: ARTHROSCOPY KNEE BILATERAL WITH MEDIAL AND LATERAL MENISECTOMIES;  Surgeon: Lorn Junes, MD;  Location: [place];  Service: Orthopedics;  Laterality: Bilateral;  . KYPHOPLASTY N/A 07/23/2017   Procedure: KYPHOPLASTY- L1;  Surgeon: Hessie Knows, MD;  Location: [facility] ORS;  Service: Orthopedics;  Laterality: N/A;  . LUMBAR LAMINECTOMY N/A 11/05/2014   Procedure: LEFT L3-4 MICRODISCECTOMY;  Surgeon: Jessy Oto, MD;  Location: [place];  Service: Orthopedics;  Laterality: N/A;  . LUMBAR LAMINECTOMY/DECOMPRESSION MICRODISCECTOMY N/A 02/12/2015   Procedure: RE-DO LEFT L3-4 MICRODISCECTOMY;  Surgeon: Jessy Oto, MD;  Location: [place];  Service: Orthopedics;  Laterality: N/A;  . NASAL SEPTUM SURGERY  1995  . TONSILLECTOMY    . UPPER GI ENDOSCOPY    . UVULOPALATOPHARYNGOPLASTY (UPPP)/TONSILLECTOMY/SEPTOPLASTY  1995   same time with septoplasty-ended up ICU almost trached   Social History   Occupational History    Comment: register of deeds  Tobacco Use  . Smoking status: Former Smoker    Packs/day: 1.50    Years: 40.00    Pack years: 60.00    Types: Cigarettes    Last attempt to quit: 01/14/1996    Years since quitting: 22.9  . Smokeless tobacco: Never Used  Substance and Sexual Activity  . Alcohol use: No    Comment: wine in a month  . Drug use: No  . Sexual activity: Never

## 2019-01-09 ENCOUNTER — Telehealth (INDEPENDENT_AMBULATORY_CARE_PROVIDER_SITE_OTHER): Payer: Self-pay | Admitting: *Deleted

## 2019-01-11 ENCOUNTER — Other Ambulatory Visit: Payer: Self-pay | Admitting: Physical Medicine and Rehabilitation

## 2019-01-11 DIAGNOSIS — M545 Low back pain, unspecified: Secondary | ICD-10-CM

## 2019-01-11 DIAGNOSIS — G8929 Other chronic pain: Secondary | ICD-10-CM

## 2019-01-18 ENCOUNTER — Other Ambulatory Visit: Payer: Self-pay | Admitting: Specialist

## 2019-01-18 DIAGNOSIS — M545 Low back pain: Principal | ICD-10-CM

## 2019-01-18 DIAGNOSIS — G8929 Other chronic pain: Secondary | ICD-10-CM

## 2019-01-23 ENCOUNTER — Other Ambulatory Visit: Payer: Self-pay | Admitting: Specialist

## 2019-01-23 ENCOUNTER — Ambulatory Visit
Admission: RE | Admit: 2019-01-23 | Discharge: 2019-01-23 | Disposition: A | Discharge status: Home / Self Care | Payer: Managed Care, Other (non HMO) | Source: Ambulatory Visit | Attending: Physical Medicine and Rehabilitation | Admitting: Physical Medicine and Rehabilitation

## 2019-01-23 ENCOUNTER — Other Ambulatory Visit: Payer: Self-pay

## 2019-01-23 DIAGNOSIS — G8929 Other chronic pain: Secondary | ICD-10-CM

## 2019-01-23 DIAGNOSIS — M545 Low back pain, unspecified: Secondary | ICD-10-CM

## 2019-01-23 MED ORDER — IOPAMIDOL (ISOVUE-M 200) INJECTION 41%
1.0000 mL | Freq: Once | INTRAMUSCULAR | Status: AC
  Administered 2019-01-23: 1 mL via EPIDURAL

## 2019-01-23 MED ORDER — METHYLPREDNISOLONE ACETATE 40 MG/ML INJ SUSP (RADIOLOG
120.0000 mg | Freq: Once | INTRAMUSCULAR | Status: AC
  Administered 2019-01-23: 120 mg via EPIDURAL

## 2019-01-23 NOTE — Discharge Instructions (Signed)

## 2019-02-02 ENCOUNTER — Ambulatory Visit (INDEPENDENT_AMBULATORY_CARE_PROVIDER_SITE_OTHER): Payer: Managed Care, Other (non HMO) | Admitting: Specialist

## 2019-02-10 ENCOUNTER — Telehealth (INDEPENDENT_AMBULATORY_CARE_PROVIDER_SITE_OTHER): Payer: Self-pay

## 2019-02-10 NOTE — Telephone Encounter (Signed)
Talked with patient and confirmed appointment for Monday, 02/13/2019 and patient answered NO to all COVID-19 screening questions.

## 2019-02-13 ENCOUNTER — Encounter (INDEPENDENT_AMBULATORY_CARE_PROVIDER_SITE_OTHER): Payer: Self-pay | Admitting: Specialist

## 2019-02-13 ENCOUNTER — Ambulatory Visit (INDEPENDENT_AMBULATORY_CARE_PROVIDER_SITE_OTHER): Payer: Managed Care, Other (non HMO) | Admitting: Specialist

## 2019-02-13 ENCOUNTER — Other Ambulatory Visit: Payer: Self-pay

## 2019-02-13 VITALS — BP 111/67 | HR 82 | Ht 72.0 in | Wt 295.0 lb

## 2019-02-13 DIAGNOSIS — M1711 Unilateral primary osteoarthritis, right knee: Secondary | ICD-10-CM

## 2019-02-13 DIAGNOSIS — M961 Postlaminectomy syndrome, not elsewhere classified: Secondary | ICD-10-CM | POA: Diagnosis not present

## 2019-02-13 DIAGNOSIS — R29898 Other symptoms and signs involving the musculoskeletal system: Secondary | ICD-10-CM | POA: Diagnosis not present

## 2019-02-13 DIAGNOSIS — M5136 Other intervertebral disc degeneration, lumbar region: Secondary | ICD-10-CM | POA: Diagnosis not present

## 2019-02-13 MED ORDER — DICLOFENAC SODIUM 1 % TD GEL: 4.0000 g | Freq: Four times a day (QID) | TRANSDERMAL | 4 refills | Status: DC

## 2019-02-13 NOTE — Progress Notes (Signed)
Office Visit Note   Patient: Eddie Carter           Date of Birth: 1941-02-16           MRN: [id number] Visit Date: 02/13/2019              Requested by: [name], MD [address] [place], [place] [address] PCP: [name], MD   Assessment & Plan: Visit Diagnoses:  1. Unilateral primary osteoarthritis, right knee   2. Degenerative disc disease, lumbar   3. Weakness of left leg   4. Post-laminectomy syndrome     Plan:  Knee is suffering from osteoarthritis, only real proven treatments are Weight loss, taking plavix and with an aspirin allergy should avoid arthritis medication. Well padded shoes help. Ice the knee 2-3 times a day 15-20 mins at a time. Avoid frequent bending and stooping  No lifting greater than 10 lbs. May use ice or moist heat for pain. Weight loss is of benefit. Best medication for lumbar disc disease is arthritis medications but you are not able to take motrin, celebrex and naprosyn due to aspirin allergy.  Exercise is important to improve your indurance and does allow people to function better inspite of back pain.  Voltaren gel 1% apply 4 grams to the right knee and rub in up to four times a day.   Follow-Up Instructions: Return in about 4 weeks (around 03/13/2019).   Orders:  No orders of the defined types were placed in this encounter.  No orders of the defined types were placed in this encounter.     Procedures: No procedures performed   Clinical Data: No additional findings.   Subjective: Chief Complaint  Patient presents with   Lower Back - Follow-up   Left Hip - Follow-up   Right Knee - Follow-up    78 year old male with history of lumbar L3-4 HNP and recurrent HNP with left leg weakness. He has had difficulty with persistent leg weakness and pain and is currently in a powered wheel chair for longer distance ambulation. Right knee pain and stiffness and he is having trouble bearing weight on the right leg due to  right knee pain not weakness. Has back pain and left leg weakness. BMI is 40.O   Review of Systems  Constitutional: Negative.  Negative for activity change, appetite change, chills, diaphoresis, fatigue, fever and unexpected weight change.  HENT: Negative.  Negative for congestion, dental problem, drooling, ear discharge, ear pain, facial swelling, hearing loss, mouth sores, nosebleeds, postnasal drip, rhinorrhea, sinus pressure, sinus pain, sneezing, sore throat, tinnitus, trouble swallowing and voice change.   Eyes: Negative.  Negative for photophobia, pain, discharge, redness, itching and visual disturbance.  Respiratory: Negative.  Negative for apnea, cough, choking, chest tightness, shortness of breath, wheezing and stridor.   Cardiovascular: Negative.  Negative for chest pain, palpitations and leg swelling.  Gastrointestinal: Negative.  Negative for abdominal distention, abdominal pain, anal bleeding, blood in stool, constipation, diarrhea, nausea, rectal pain and vomiting.  Endocrine: Negative.  Negative for cold intolerance, heat intolerance, polydipsia, polyphagia and polyuria.  Genitourinary: Negative.  Negative for difficulty urinating, dysuria, enuresis, flank pain, frequency, genital sores, hematuria and penile pain.  Musculoskeletal: Positive for arthralgias, back pain, gait problem and joint swelling. Negative for myalgias, neck pain and neck stiffness.  Skin: Negative.  Negative for color change, pallor, rash and wound.  Allergic/Immunologic: Negative.  Negative for environmental allergies, food allergies and immunocompromised state.  Neurological: Positive for weakness  and numbness. Negative for dizziness, tremors, seizures, syncope, facial asymmetry, speech difficulty, light-headedness and headaches.  Hematological: Negative.  Negative for adenopathy. Does not bruise/bleed easily.  Psychiatric/Behavioral: Negative.  Negative for agitation, behavioral problems, confusion, decreased  concentration, dysphoric mood, hallucinations, self-injury, sleep disturbance and suicidal ideas. The patient is not nervous/anxious and is not hyperactive.      Objective: Vital Signs: BP 111/67 (BP Location: Left Arm, Patient Position: Sitting)    Pulse 82    Ht 6' (1.829 m)    Wt 295 lb (133.8 kg)    BMI 40.01 kg/m   Physical Exam Constitutional:      Appearance: He is well-developed.  HENT:     Head: Normocephalic and atraumatic.  Eyes:     Pupils: Pupils are equal, round, and reactive to light.  Neck:     Musculoskeletal: Normal range of motion and neck supple.  Pulmonary:     Effort: Pulmonary effort is normal.     Breath sounds: Normal breath sounds.  Abdominal:     General: Bowel sounds are normal.     Palpations: Abdomen is soft.  Musculoskeletal: Normal range of motion.  Skin:    General: Skin is warm and dry.  Neurological:     Mental Status: He is alert and oriented to person, place, and time.  Psychiatric:        Behavior: Behavior normal.        Thought Content: Thought content normal.        Judgment: Judgment normal.     Back Exam   Tenderness  The patient is experiencing tenderness in the lumbar.  Range of Motion  Extension: normal  Flexion: normal  Lateral bend right: normal  Lateral bend left: normal  Rotation right: normal  Rotation left: normal   Muscle Strength  Right Quadriceps:  4/5  Left Quadriceps:  5/5  Right Hamstrings:  5/5  Left Hamstrings:  5/5   Tests  Straight leg raise right: negative Straight leg raise left: negative  Reflexes  Patellar: 0/4 Achilles: 0/4 Babinski's sign: normal   Other  Toe walk: abnormal Heel walk: abnormal Sensation: decreased Gait: varus thrust  Erythema: no back redness Scars: absent      Specialty Comments:  No specialty comments available.  Imaging: No results found.   PMFS History: Patient Active Problem List   Diagnosis Date Noted   Closed compression fracture of L1  lumbar vertebra 07/15/2017   Back pain 07/15/2017   Aspiration into airway 07/15/2017   Wheezing 07/15/2017   Leukocytosis 07/15/2017   Bilateral calf pain 07/15/2017   Low back pain 07/13/2017   Cephalalgia 01/29/2016   Adiposity 01/29/2016   Hypertriglyceridemia 01/29/2016   H/O respiratory system disease 01/29/2016   H/O erectile dysfunction 01/29/2016   Cerebrovascular accident, old 01/29/2016   Diabetes mellitus ([place]) 01/29/2016   Chronic obstructive pulmonary disease ([place]) 01/29/2016   Benign fibroma of prostate 01/29/2016   Chronic pain 01/29/2016   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   Abnormal MRI, lumbar spine 01/29/2016   Chronic low back pain (Location of Primary Source of Pain) (Bilateral) (L>R) 01/29/2016   Chronic lower extremity pain (Location of Secondary source of pain) (Bilateral) (L>R) 01/29/2016   Long term current use of opiate analgesic 01/29/2016   Long term prescription opiate use 01/29/2016   Opiate use 01/29/2016   Encounter for therapeutic drug level monitoring 01/29/2016   Encounter for pain management planning 01/29/2016   Chronic lumbar radicular pain (Location of Secondary source  of pain) (Bilateral) (L4 Dermatome) (L>R) 01/29/2016   Chronic hip pain (Location of Tertiary source of pain) (Bilateral) (L>R) 01/29/2016   Chronic knee pain (Bilateral) (L>R) 01/29/2016   Chronic anticoagulation (Plavix) 01/29/2016   Erectile dysfunction of organic origin 01/22/2016   BPH with obstruction/lower urinary tract symptoms 07/24/2015   Hypogonadism in male 07/24/2015   HNP (herniated nucleus pulposus), lumbar 11/05/2014    Class: Acute   Herniated nucleus pulposus, lumbar 11/05/2014   Pulmonary nodules 10/25/2013   ILD (interstitial lung disease) ([place]) 08/22/2013   Traumatic tear of lateral meniscus of right knee    Medial meniscus, posterior horn derangement    COPD (chronic obstructive pulmonary disease) (H[facility])      Shortness of breath    Sleep apnea    Stroke ([place])    Arthritis    GERD (gastroesophageal reflux disease)    History of GI bleed    HOH (hard of hearing)    Full dentures    Complication of anesthesia    Past Medical History:  Diagnosis Date   Arthritis    Asthma    BPH (benign prostatic hyperplasia)    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 ago   COPD (chronic obstructive pulmonary disease) (H[facility])    Depression    Diabetes mellitus without complication ([facility])    ED (erectile dysfunction)    Full dentures    GERD (gastroesophageal reflux disease)    no meds   Headache    difficulty with headaches- 1950's , again in 1967, 2008- again problems with migrraines    History of GI bleed    History of urethral stricture    HOH (hard of hearing)    HOH (hard of hearing)    Hypogonadism in male    Joint pain    Medial meniscus, posterior horn derangement    left knee   Obesity    Pneumonia 2014   [facility]   Shortness of breath    Skin cancer    Sleep apnea    uses a cpap, most nights    Stroke ([place]) 1995   some speech and thought processes slow   Traumatic tear of lateral meniscus of right knee    right knee   Urinary frequency    Urinary incontinence     Family History  Problem Relation Age of Onset   Cancer Mother    Heart attack Father    Heart disease Unknown    Arthritis Unknown    Prostate cancer Neg Hx    Kidney disease Neg Hx     Past Surgical History:  Procedure Laterality Date   APPENDECTOMY  1963   CARPAL TUNNEL RELEASE     left   CATARACT EXTRACTION W/PHACO Left 09/13/2018   Procedure: CATARACT EXTRACTION PHACO AND INTRAOCULAR LENS PLACEMENT ([place]);  Surgeon: Birder Robson, MD;  Location: [facility] ORS;  Service: Ophthalmology;  Laterality: Left;  [place]  00:42 CDE 6.73 Fluid pack lot #@ [id number]   COLONOSCOPY      COLONOSCOPY WITH PROPOFOL N/A 12/07/2016   Procedure: COLONOSCOPY WITH PROPOFOL;  Surgeon: Lollie Sails, MD;  Location: [facility] ENDOSCOPY;  Service: Endoscopy;  Laterality: N/A;   EYE SURGERY     foreign body removed fr. eye, ? side    FOOT FOREIGN BODY REMOVAL  1976   left foot -multiple pieces glass   HEMORROIDECTOMY  1995  KNEE ARTHROSCOPY Bilateral 01/17/2013   Procedure: ARTHROSCOPY KNEE BILATERAL WITH MEDIAL AND LATERAL MENISECTOMIES;  Surgeon: Lorn Junes, MD;  Location: [facility];  Service: Orthopedics;  Laterality: Bilateral;   KYPHOPLASTY N/A 07/23/2017   Procedure: KYPHOPLASTY- L1;  Surgeon: Hessie Knows, MD;  Location: [facility] ORS;  Service: Orthopedics;  Laterality: N/A;   LUMBAR LAMINECTOMY N/A 11/05/2014   Procedure: LEFT L3-4 MICRODISCECTOMY;  Surgeon: Jessy Oto, MD;  Location: [facility];  Service: Orthopedics;  Laterality: N/A;   LUMBAR LAMINECTOMY/DECOMPRESSION MICRODISCECTOMY N/A 02/12/2015   Procedure: RE-DO LEFT L3-4 MICRODISCECTOMY;  Surgeon: Jessy Oto, MD;  Location: [facility];  Service: Orthopedics;  Laterality: N/A;   NASAL SEPTUM SURGERY  1995   TONSILLECTOMY     UPPER GI ENDOSCOPY     UVULOPALATOPHARYNGOPLASTY (UPPP)/TONSILLECTOMY/SEPTOPLASTY  1995   same time with septoplasty-ended up ICU almost trached   Social History   Occupational History    Comment: register of deeds  Tobacco Use   Smoking status: Former Smoker    Packs/day: 1.50    Years: 40.00    Pack years: 60.00    Types: Cigarettes    Last attempt to quit: 01/14/1996    Years since quitting: 23.0   Smokeless tobacco: Never Used  Substance and Sexual Activity   Alcohol use: No    Comment: wine in a month   Drug use: No   Sexual activity: Never

## 2019-02-13 NOTE — Patient Instructions (Signed)
Plan:  Knee is suffering from osteoarthritis, only real proven treatments are Weight loss, taking plavix and with an aspirin allergy should avoid arthritis medication. Well padded shoes help. Ice the knee 2-3 times a day 15-20 mins at a time. Avoid frequent bending and stooping  No lifting greater than 10 lbs. May use ice or moist heat for pain. Weight loss is of benefit. Best medication for lumbar disc disease is arthritis medications but you are not able to take motrin, celebrex and naprosyn due to aspirin allergy.  Exercise is important to improve your indurance and does allow people to function better inspite of back pain.  Voltaren gel 1% apply 4 grams to the right knee and rub in up to four times a day.

## 2019-03-06 ENCOUNTER — Other Ambulatory Visit: Payer: Self-pay | Admitting: Urology

## 2019-03-08 ENCOUNTER — Ambulatory Visit: Payer: Medicare Other | Admitting: Urology

## 2019-04-13 ENCOUNTER — Encounter: Payer: Self-pay | Admitting: Specialist

## 2019-04-13 ENCOUNTER — Telehealth: Payer: Self-pay | Admitting: Radiology

## 2019-04-13 ENCOUNTER — Ambulatory Visit (INDEPENDENT_AMBULATORY_CARE_PROVIDER_SITE_OTHER): Payer: Managed Care, Other (non HMO) | Admitting: Specialist

## 2019-04-13 ENCOUNTER — Other Ambulatory Visit: Payer: Self-pay

## 2019-04-13 VITALS — BP 113/62 | HR 73 | Ht 72.0 in | Wt 295.0 lb

## 2019-04-13 DIAGNOSIS — M48062 Spinal stenosis, lumbar region with neurogenic claudication: Secondary | ICD-10-CM | POA: Diagnosis not present

## 2019-04-13 DIAGNOSIS — M12811 Other specific arthropathies, not elsewhere classified, right shoulder: Secondary | ICD-10-CM

## 2019-04-13 DIAGNOSIS — M1711 Unilateral primary osteoarthritis, right knee: Secondary | ICD-10-CM | POA: Diagnosis not present

## 2019-04-13 MED ORDER — METHYLPREDNISOLONE ACETATE 40 MG/ML IJ SUSP
40.0000 mg | INTRAMUSCULAR | Status: AC | PRN
  Administered 2019-04-13: 15:00:00 40 mg via INTRA_ARTICULAR

## 2019-04-13 MED ORDER — BUPIVACAINE HCL 0.25 % IJ SOLN
4.0000 mL | INTRAMUSCULAR | Status: AC | PRN
  Administered 2019-04-13: 15:00:00 4 mL via INTRA_ARTICULAR

## 2019-04-13 NOTE — Telephone Encounter (Signed)
Please get approval for Synvisc One for the right knee.

## 2019-04-13 NOTE — Patient Instructions (Signed)
Plan: The main ways of treat osteoarthritis, that are found to be success. Weight loss helps to decrease pain. Exercise is important to maintaining cartilage and thickness and strengthening. Extension exercise manual given patient.  NSAIDs like arthritis strength tylenol  are meds decreasing the inflamation 3-4 per day. Ice is okay  In afternoon and evening and hot shower in the am Return in one month for a follow up appt and we will order Synvisc One material for use with injection treatment of osteoarthritis of the right knee.   Avoid overhead lifting and overhead use of the arms. Do not lift greater than 10 lbs. Tylenol ES one every 6-8 hours for pain and inflamation.  A home exercise program.  Return in 4 weeks and if the back and right leg pain recurrs we will consider further epidural steroid injection as the last injection seemed to help.

## 2019-04-13 NOTE — Progress Notes (Signed)
Office Visit Note   Patient: Eddie Carter           Date of Birth: [birth year]           MRN: [id number] Visit Date: 04/13/2019              Requested by: [name], MD [address] [place], [place] [address] PCP: [name], MD   Assessment & Plan: Visit Diagnoses:  1. Unilateral primary osteoarthritis, right knee   2. Spinal stenosis of lumbar region with neurogenic claudication     Plan: The main ways of treat osteoarthritis, that are found to be success. Weight loss helps to decrease pain. Exercise is important to maintaining cartilage and thickness and strengthening. Extension exercise manual given patient.  NSAIDs like arthritis strength tylenol  are meds decreasing the inflamation 3-4 per day. Ice is okay  In afternoon and evening and hot shower in the am Return in one month for a follow up appt and we will order Synvisc One material for use with injection treatment of osteoarthritis of the right knee.   Avoid overhead lifting and overhead use of the arms. Do not lift greater than 10 lbs. Tylenol ES one every 6-8 hours for pain and inflamation.  A home exercise program.  Return in 4 weeks and if the back and right leg pain recurrs we will consider further epidural steroid injection as the last injection seemed to help.  Follow-Up Instructions: Return in about 6 weeks (around 05/25/2019).   Orders:  No orders of the defined types were placed in this encounter.  No orders of the defined types were placed in this encounter.     Procedures: Large Joint Inj: R glenohumeral on 04/13/2019 3:20 PM Indications: pain Details: 25 G 1.5 in needle, anterolateral approach  Arthrogram: No  Medications: 4 mL bupivacaine 0.25 %; 40 mg methylPREDNISolone acetate 40 MG/ML Outcome: tolerated well, no immediate complications  Bandaid right shoulder Procedure, treatment alternatives, risks and benefits explained, specific risks discussed. Consent was given by the  patient. Immediately prior to procedure a time out was called to verify the correct patient, procedure, equipment, support staff and site/side marked as required. Patient was prepped and draped in the usual sterile fashion.   Large Joint Inj: R knee on 04/13/2019 3:23 PM Indications: pain Details: 25 G 1.5 in needle, anterolateral approach  Arthrogram: No  Medications: 40 mg methylPREDNISolone acetate 40 MG/ML; 4 mL bupivacaine 0.25 % Outcome: tolerated well, no immediate complications  bandaid applied Procedure, treatment alternatives, risks and benefits explained, specific risks discussed. Consent was given by the patient. Immediately prior to procedure a time out was called to verify the correct patient, procedure, equipment, support staff and site/side marked as required. Patient was prepped and draped in the usual sterile fashion.       Clinical Data: No additional findings.   Subjective: Chief Complaint  Patient presents with  . Lower Back - Follow-up  . Right Knee - Follow-up  . Right Shoulder - Follow-up    78 year old male with history of right knee osteoarthritis and right shoulder full thickness RCT with retraction. He has had previous left L3-4 HNP with microdiscectomy and recurrence of HNP with a redo microdiscectomy. Severe left leg weakness post left recurrent HNP and now the left leg strength is better but still weak. He was seen last and left L4 ESI was done at [facility] imaging with some good relief of pain for several weeks. No bowel or  bladder difficulty. Constipation is a problem. He can only walk a few steps the length of the exam room only. He would fall if he were to  Try to walk any further. The falls did the right shoulder in and the right knee. Presently taking tylenol for pain.    Review of Systems  Constitutional: Negative.   HENT: Negative.   Eyes: Negative.   Respiratory: Negative.   Cardiovascular: Negative.   Gastrointestinal: Negative.   Endocrine:  Negative.   Genitourinary: Negative.   Musculoskeletal: Negative.   Skin: Negative.   Allergic/Immunologic: Negative.   Neurological: Negative.   Hematological: Negative.   Psychiatric/Behavioral: Negative.      Objective: Vital Signs: BP 113/62 (BP Location: Left Arm, Patient Position: Sitting)   Pulse 73   Ht 6' (1.829 m)   Wt 295 lb (133.8 kg)   BMI 40.01 kg/m   Physical Exam Constitutional:      Appearance: He is well-developed.  HENT:     Head: Normocephalic and atraumatic.  Eyes:     Pupils: Pupils are equal, round, and reactive to light.  Neck:     Musculoskeletal: Normal range of motion and neck supple.  Pulmonary:     Effort: Pulmonary effort is normal.     Breath sounds: Normal breath sounds.  Abdominal:     General: Bowel sounds are normal.     Palpations: Abdomen is soft.  Skin:    General: Skin is warm and dry.  Neurological:     Mental Status: He is alert and oriented to person, place, and time.  Psychiatric:        Behavior: Behavior normal.        Thought Content: Thought content normal.        Judgment: Judgment normal.     Back Exam   Tenderness  The patient is experiencing tenderness in the lumbar.  Range of Motion  Extension: abnormal  Flexion: abnormal  Lateral bend right: abnormal  Lateral bend left: abnormal  Rotation right: abnormal  Rotation left: abnormal   Muscle Strength  Right Quadriceps:  5/5  Left Quadriceps:  4/5  Right Hamstrings:  5/5  Left Hamstrings:  5/5   Reflexes  Patellar: 0/4 Achilles: 2/4 Babinski's sign: normal   Other  Toe walk: abnormal Heel walk: abnormal Sensation: decreased Gait: antalgic  Erythema: no back redness Scars: absent   Right Shoulder Exam   Tenderness  The patient is experiencing tenderness in the acromion.  Range of Motion  Active abduction: 130  Passive abduction:  140 abnormal  Extension: 30  External rotation: 70  Forward flexion: 80  Internal rotation 0 degrees: L1   Internal rotation 90 degrees: 60   Muscle Strength  Abduction: 2/5  Internal rotation: 4/5  External rotation: 2/5  Supraspinatus: 2/5  Subscapularis: 4/5  Biceps: 5/5   Tests  Apprehension: positive Hawkins test: negative Cross arm: negative Impingement: positive Drop arm: positive  Other  Erythema: absent Scars: absent Sensation: normal Pulse: present      Specialty Comments:  No specialty comments available.  Imaging: No results found.   PMFS History: Patient Active Problem List   Diagnosis Date Noted  . Closed compression fracture of L1 lumbar vertebra 07/15/2017  . Back pain 07/15/2017  . Aspiration into airway 07/15/2017  . Wheezing 07/15/2017  . Leukocytosis 07/15/2017  . Bilateral calf pain 07/15/2017  . Low back pain 07/13/2017  . Cephalalgia 01/29/2016  . Adiposity 01/29/2016  . Hypertriglyceridemia 01/29/2016  . H/O  respiratory system disease 01/29/2016  . H/O erectile dysfunction 01/29/2016  . Cerebrovascular accident, old 01/29/2016  . Diabetes mellitus ([place]) 01/29/2016  . Chronic obstructive pulmonary disease ([place]) 01/29/2016  . Benign fibroma of prostate 01/29/2016  . Chronic pain 01/29/2016  . 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 Abnormal MRI, lumbar spine 01/29/2016  . Chronic low back pain (Location of Primary Source of Pain) (Bilateral) (L>R) 01/29/2016  . Chronic lower extremity pain (Location of Secondary source of pain) (Bilateral) (L>R) 01/29/2016  . Long term current use of opiate analgesic 01/29/2016  . Long term prescription opiate use 01/29/2016  . Opiate use 01/29/2016  . Encounter for therapeutic drug level monitoring 01/29/2016  . Encounter for pain management planning 01/29/2016  . Chronic lumbar radicular pain (Location of Secondary source of pain) (Bilateral) (L4 Dermatome) (L>R) 01/29/2016  . Chronic hip pain (Location of Tertiary source of pain) (Bilateral) (L>R) 01/29/2016  . Chronic knee pain (Bilateral)  (L>R) 01/29/2016  . Chronic anticoagulation (Plavix) 01/29/2016  . Erectile dysfunction of organic origin 01/22/2016  . BPH with obstruction/lower urinary tract symptoms 07/24/2015  . Hypogonadism in male 07/24/2015  . HNP (herniated nucleus pulposus), lumbar 11/05/2014    Class: Acute  . Herniated nucleus pulposus, lumbar 11/05/2014  . Pulmonary nodules 10/25/2013  . ILD (interstitial lung disease) ([place]) 08/22/2013  . Traumatic tear of lateral meniscus of right knee   . Medial meniscus, posterior horn derangement   . COPD (chronic obstructive pulmonary disease) ([place])   . Shortness of breath   . Sleep apnea   . Stroke ([place])   . Arthritis   . GERD (gastroesophageal reflux disease)   . History of GI bleed   . HOH (hard of hearing)   . Full dentures   . Complication of anesthesia    Past Medical History:  Diagnosis Date  . Arthritis   . Asthma   . BPH (benign prostatic hyperplasia)   . 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 Complication of anesthesia    after septoplasty and uvulectomy-was in icu- [facility] - 10 yrs. ago  . COPD (chronic obstructive pulmonary disease) ([place])   . Depression   . Diabetes mellitus without complication ([place])   . ED (erectile dysfunction)   . Full dentures   . GERD (gastroesophageal reflux disease)    no meds  . Headache    difficulty with headaches- 1950's , again in 1967, 2008- again problems with migrraines   . History of GI bleed   . History of urethral stricture   . HOH (hard of hearing)   . HOH (hard of hearing)   . Hypogonadism in male   . Joint pain   . Medial meniscus, posterior horn derangement    left knee  . Obesity   . Pneumonia 2014   [facility]  . Shortness of breath   . Skin cancer   . Sleep apnea    uses a cpap, most nights   . Stroke [facility]) 1995   some speech and thought processes slow  . Traumatic tear of lateral meniscus of right knee    right knee  . Urinary frequency   . Urinary incontinence     Family History   Problem Relation Age of Onset  . Cancer Mother   . Heart attack Father   . Heart disease Unknown   . Arthritis Unknown   . Prostate cancer Neg Hx   . Kidney disease Neg Hx     Past  Surgical History:  Procedure Laterality Date  . APPENDECTOMY  1963  . CARPAL TUNNEL RELEASE     left  . CATARACT EXTRACTION W/PHACO Left 09/13/2018   Procedure: CATARACT EXTRACTION PHACO AND INTRAOCULAR LENS PLACEMENT (IOC);  Surgeon: Birder Robson, MD;  Location: [facility] ORS;  Service: Ophthalmology;  Laterality: Left;  [place]  00:42 CDE 6.73 Fluid pack lot #@ [id number]  . COLONOSCOPY    . COLONOSCOPY WITH PROPOFOL N/A 12/07/2016   Procedure: COLONOSCOPY WITH PROPOFOL;  Surgeon: Lollie Sails, MD;  Location: [facility] ENDOSCOPY;  Service: Endoscopy;  Laterality: N/A;  . EYE SURGERY     foreign body removed fr. eye, ? side   . FOOT FOREIGN BODY REMOVAL  1976   left foot -multiple pieces glass  . HEMORROIDECTOMY  1995  . KNEE ARTHROSCOPY Bilateral 01/17/2013   Procedure: ARTHROSCOPY KNEE BILATERAL WITH MEDIAL AND LATERAL MENISECTOMIES;  Surgeon: Lorn Junes, MD;  Location: [place];  Service: Orthopedics;  Laterality: Bilateral;  . KYPHOPLASTY N/A 07/23/2017   Procedure: KYPHOPLASTY- L1;  Surgeon: Hessie Knows, MD;  Location: [facility] ORS;  Service: Orthopedics;  Laterality: N/A;  . LUMBAR LAMINECTOMY N/A 11/05/2014   Procedure: LEFT L3-4 MICRODISCECTOMY;  Surgeon: Jessy Oto, MD;  Location: [place];  Service: Orthopedics;  Laterality: N/A;  . LUMBAR LAMINECTOMY/DECOMPRESSION MICRODISCECTOMY N/A 02/12/2015   Procedure: RE-DO LEFT L3-4 MICRODISCECTOMY;  Surgeon: Jessy Oto, MD;  Location: [place];  Service: Orthopedics;  Laterality: N/A;  . NASAL SEPTUM SURGERY  1995  . TONSILLECTOMY    . UPPER GI ENDOSCOPY    . UVULOPALATOPHARYNGOPLASTY (UPPP)/TONSILLECTOMY/SEPTOPLASTY  1995   same time with septoplasty-ended up ICU almost trached   Social History   Occupational History    Comment:  register of deeds  Tobacco Use  . Smoking status: Former Smoker    Packs/day: 1.50    Years: 40.00    Pack years: 60.00    Types: Cigarettes    Last attempt to quit: 01/14/1996    Years since quitting: 23.2  . Smokeless tobacco: Never Used  Substance and Sexual Activity  . Alcohol use: No    Comment: wine in a month  . Drug use: No  . Sexual activity: Never

## 2019-04-14 NOTE — Telephone Encounter (Signed)
Noted  

## 2019-04-18 ENCOUNTER — Telehealth: Payer: Self-pay

## 2019-04-18 NOTE — Telephone Encounter (Signed)
Submitted VOB for SynviscOne, right knee. 

## 2019-04-22 ENCOUNTER — Telehealth: Payer: Self-pay | Admitting: Internal Medicine

## 2019-04-22 NOTE — Telephone Encounter (Signed)
Called patient to notify him of possible covid-19 exposure and to schedule him for testing. He stated he only gets out of his bed for a medical appointment or o go to his office. He stated he is not able to go out to be tested. Advised him to call back if he starts having symptoms of covid-19 and he stated that he would.

## 2019-04-28 ENCOUNTER — Telehealth: Payer: Self-pay

## 2019-04-28 NOTE — Telephone Encounter (Signed)
PA required for SynviscOne, right knee. Faxed completed PA form to [employer] at [phone]. 

## 2019-05-01 ENCOUNTER — Telehealth: Payer: Self-pay

## 2019-05-01 NOTE — Telephone Encounter (Signed)
Approved for SynviscOne, right knee. Buy & Bill Covered at 80% after deductible has been met. Patient will be responsible for 20% OOP. No Co-pay PA required PA Approval# [id number] Valid 04/28/2019- 05/19/2019 (48 units only)

## 2019-05-01 NOTE — Telephone Encounter (Signed)
Talked with patient to advise him that he has been approved for SynviscOne, right knee.  Advised patient that injection has been approved until 05/19/2019, per his insurance and that Dr. Louanne Skye would be out of the office for a week and offered him an appointment to see his PA, Benjiman Core.  Patient declined to see Benjiman Core and prefers to see Dr. Louanne Skye.  Advised patient that a message would be sent to [facility] to check his schedule and that he could call and see if Dr. Louanne Skye has a cancellation before his approval expires.  Please advise.  Thank you

## 2019-05-02 NOTE — Telephone Encounter (Signed)
Alert added

## 2019-05-05 NOTE — Telephone Encounter (Signed)
I called and advised patient that Dr. Louanne Skye did not have any available appts and advised that I could get him in Dr.Hilts next week for his injection. Patient agreed to this and was sched for 05/08/19 @4pm .  He states that he fell last night and hit his head and back on the metal computer desk in his room and he is complaining that his head hurts, I strongly erged him that he should go to the ER and have them eval him for concussion. He states that there is a knot on his head. Patient did sound like his normal self to me on the phone.

## 2019-05-08 ENCOUNTER — Telehealth: Payer: Self-pay

## 2019-05-08 ENCOUNTER — Other Ambulatory Visit: Payer: Self-pay

## 2019-05-08 ENCOUNTER — Ambulatory Visit (INDEPENDENT_AMBULATORY_CARE_PROVIDER_SITE_OTHER): Payer: Managed Care, Other (non HMO) | Admitting: Family Medicine

## 2019-05-08 DIAGNOSIS — M1711 Unilateral primary osteoarthritis, right knee: Secondary | ICD-10-CM | POA: Diagnosis not present

## 2019-05-08 DIAGNOSIS — M48062 Spinal stenosis, lumbar region with neurogenic claudication: Secondary | ICD-10-CM

## 2019-05-08 NOTE — Telephone Encounter (Signed)
Patient called concerning appointment time.

## 2019-05-08 NOTE — Progress Notes (Signed)
Subjective: He is here for Synvisc 1 injection for right knee osteoarthritis.  Only short-term relief from cortisone.  He also asked me to schedule another lumbar epidural injection.  Objective: Trace effusion right knee, no warmth or erythema.  Procedure: Procedure: Ultrasound-guided right knee Synvisc 1 injection: After sterile prep with Betadine, injected 3 cc 1% lidocaine without epinephrine and Synvisc 1 from lateral midpatellar approach using ultrasound to guide needle placement to ensure intra-articular injection.  He tolerated this without complication.  Follow-up as directed.

## 2019-06-13 ENCOUNTER — Ambulatory Visit: Payer: Medicare Other | Admitting: Urology

## 2019-06-22 ENCOUNTER — Other Ambulatory Visit: Admission: RE | Admit: 2019-06-22 | Payer: Managed Care, Other (non HMO) | Source: Ambulatory Visit

## 2019-06-23 ENCOUNTER — Other Ambulatory Visit: Payer: Self-pay

## 2019-06-23 ENCOUNTER — Other Ambulatory Visit
Admission: RE | Admit: 2019-06-23 | Discharge: 2019-06-23 | Disposition: A | Discharge status: Home / Self Care | Payer: Managed Care, Other (non HMO) | Source: Ambulatory Visit | Attending: Gastroenterology | Admitting: Gastroenterology

## 2019-06-23 ENCOUNTER — Encounter: Payer: Self-pay | Admitting: *Deleted

## 2019-06-23 DIAGNOSIS — Z20828 Contact with and (suspected) exposure to other viral communicable diseases: Secondary | ICD-10-CM | POA: Diagnosis present

## 2019-06-23 DIAGNOSIS — Z01812 Encounter for preprocedural laboratory examination: Secondary | ICD-10-CM | POA: Insufficient documentation

## 2019-06-23 LAB — SARS CORONAVIRUS 2 (TAT 6-24 HRS): SARS Coronavirus 2: NEGATIVE

## 2019-06-26 ENCOUNTER — Encounter: Payer: Self-pay | Admitting: Anesthesiology

## 2019-06-26 ENCOUNTER — Encounter
Admission: RE | Disposition: A | Discharge status: Home / Self Care | Payer: Self-pay | Source: Home / Self Care | Attending: Gastroenterology

## 2019-06-26 ENCOUNTER — Ambulatory Visit
Admission: RE | Admit: 2019-06-26 | Discharge: 2019-06-26 | Disposition: A | Discharge status: Home / Self Care | Payer: Managed Care, Other (non HMO) | Attending: Gastroenterology | Admitting: Gastroenterology

## 2019-06-26 ENCOUNTER — Ambulatory Visit: Payer: Managed Care, Other (non HMO) | Admitting: Anesthesiology

## 2019-06-26 ENCOUNTER — Other Ambulatory Visit: Payer: Self-pay

## 2019-06-26 DIAGNOSIS — Z6835 Body mass index (BMI) 35.0-35.9, adult: Secondary | ICD-10-CM | POA: Insufficient documentation

## 2019-06-26 DIAGNOSIS — K219 Gastro-esophageal reflux disease without esophagitis: Secondary | ICD-10-CM | POA: Diagnosis not present

## 2019-06-26 DIAGNOSIS — N400 Enlarged prostate without lower urinary tract symptoms: Secondary | ICD-10-CM | POA: Insufficient documentation

## 2019-06-26 DIAGNOSIS — K635 Polyp of colon: Secondary | ICD-10-CM | POA: Diagnosis not present

## 2019-06-26 DIAGNOSIS — F329 Major depressive disorder, single episode, unspecified: Secondary | ICD-10-CM | POA: Insufficient documentation

## 2019-06-26 DIAGNOSIS — G4730 Sleep apnea, unspecified: Secondary | ICD-10-CM | POA: Diagnosis not present

## 2019-06-26 DIAGNOSIS — Z8673 Personal history of transient ischemic attack (TIA), and cerebral infarction without residual deficits: Secondary | ICD-10-CM | POA: Insufficient documentation

## 2019-06-26 DIAGNOSIS — Z79890 Hormone replacement therapy (postmenopausal): Secondary | ICD-10-CM | POA: Insufficient documentation

## 2019-06-26 DIAGNOSIS — D120 Benign neoplasm of cecum: Secondary | ICD-10-CM | POA: Insufficient documentation

## 2019-06-26 DIAGNOSIS — K621 Rectal polyp: Secondary | ICD-10-CM | POA: Diagnosis not present

## 2019-06-26 DIAGNOSIS — K5730 Diverticulosis of large intestine without perforation or abscess without bleeding: Secondary | ICD-10-CM | POA: Diagnosis not present

## 2019-06-26 DIAGNOSIS — Z87891 Personal history of nicotine dependence: Secondary | ICD-10-CM | POA: Insufficient documentation

## 2019-06-26 DIAGNOSIS — Z7902 Long term (current) use of antithrombotics/antiplatelets: Secondary | ICD-10-CM | POA: Insufficient documentation

## 2019-06-26 DIAGNOSIS — J449 Chronic obstructive pulmonary disease, unspecified: Secondary | ICD-10-CM | POA: Diagnosis not present

## 2019-06-26 DIAGNOSIS — D122 Benign neoplasm of ascending colon: Secondary | ICD-10-CM | POA: Insufficient documentation

## 2019-06-26 DIAGNOSIS — E119 Type 2 diabetes mellitus without complications: Secondary | ICD-10-CM | POA: Diagnosis not present

## 2019-06-26 DIAGNOSIS — Z91030 Bee allergy status: Secondary | ICD-10-CM | POA: Insufficient documentation

## 2019-06-26 DIAGNOSIS — K640 First degree hemorrhoids: Secondary | ICD-10-CM | POA: Diagnosis not present

## 2019-06-26 DIAGNOSIS — E669 Obesity, unspecified: Secondary | ICD-10-CM | POA: Diagnosis not present

## 2019-06-26 DIAGNOSIS — Z1211 Encounter for screening for malignant neoplasm of colon: Secondary | ICD-10-CM | POA: Insufficient documentation

## 2019-06-26 DIAGNOSIS — Z888 Allergy status to other drugs, medicaments and biological substances status: Secondary | ICD-10-CM | POA: Insufficient documentation

## 2019-06-26 DIAGNOSIS — M1990 Unspecified osteoarthritis, unspecified site: Secondary | ICD-10-CM | POA: Insufficient documentation

## 2019-06-26 DIAGNOSIS — Z86010 Personal history of colonic polyps: Secondary | ICD-10-CM | POA: Insufficient documentation

## 2019-06-26 DIAGNOSIS — Z886 Allergy status to analgesic agent status: Secondary | ICD-10-CM | POA: Insufficient documentation

## 2019-06-26 DIAGNOSIS — Z7951 Long term (current) use of inhaled steroids: Secondary | ICD-10-CM | POA: Insufficient documentation

## 2019-06-26 DIAGNOSIS — Z85828 Personal history of other malignant neoplasm of skin: Secondary | ICD-10-CM | POA: Diagnosis not present

## 2019-06-26 DIAGNOSIS — Z79899 Other long term (current) drug therapy: Secondary | ICD-10-CM | POA: Insufficient documentation

## 2019-06-26 HISTORY — DX: Other specified diseases of intestine: K63.89

## 2019-06-26 HISTORY — PX: COLONOSCOPY WITH PROPOFOL: SHX5780

## 2019-06-26 HISTORY — DX: Personal history of other specified conditions: Z87.898

## 2019-06-26 SURGERY — COLONOSCOPY WITH PROPOFOL
Anesthesia: General

## 2019-06-26 MED ORDER — LIDOCAINE HCL (CARDIAC) PF 100 MG/5ML IV SOSY
PREFILLED_SYRINGE | INTRAVENOUS | Status: DC | PRN
  Administered 2019-06-26: 100 mg via INTRAVENOUS

## 2019-06-26 MED ORDER — PROPOFOL 10 MG/ML IV BOLUS
INTRAVENOUS | Status: DC | PRN
  Administered 2019-06-26 (×2): 20 mg via INTRAVENOUS

## 2019-06-26 MED ORDER — PROPOFOL 500 MG/50ML IV EMUL
INTRAVENOUS | Status: AC
  Filled 2019-06-26: qty 200

## 2019-06-26 MED ORDER — PROPOFOL 500 MG/50ML IV EMUL
INTRAVENOUS | Status: DC | PRN
  Administered 2019-06-26: 100 ug/kg/min via INTRAVENOUS

## 2019-06-26 MED ORDER — SODIUM CHLORIDE 0.9 % IV SOLN
INTRAVENOUS | Status: DC
  Administered 2019-06-26: 12:00:00 via INTRAVENOUS

## 2019-06-26 MED ORDER — ONDANSETRON HCL 4 MG/2ML IJ SOLN
INTRAMUSCULAR | Status: DC | PRN
  Administered 2019-06-26: 4 mg via INTRAVENOUS

## 2019-06-26 NOTE — Anesthesia Post-op Follow-up Note (Signed)
Anesthesia QCDR form completed.        

## 2019-06-26 NOTE — Anesthesia Preprocedure Evaluation (Signed)
Anesthesia Evaluation  Patient identified by MRN, date of birth, ID band Patient awake    Reviewed: Allergy & Precautions, H&P , NPO status , Patient's Chart, lab work & pertinent test results  History of Anesthesia Complications Negative for: history of anesthetic complications  Airway Mallampati: III  TM Distance: <3 FB Neck ROM: limited    Dental  (+) Poor Dentition, Missing, Upper Dentures, Lower Dentures   Pulmonary shortness of breath and with exertion, asthma , sleep apnea , pneumonia, resolved, COPD, former smoker,    Pulmonary exam normal breath sounds clear to auscultation       Cardiovascular Exercise Tolerance: Good (-) angina(-) Past MI and (-) DOE negative cardio ROS Normal cardiovascular exam Rhythm:regular Rate:Normal     Neuro/Psych  Headaches, CVA negative psych ROS   GI/Hepatic Neg liver ROS, GERD  Controlled,  Endo/Other  diabetes, Type 2  Renal/GU negative Renal ROS  negative genitourinary   Musculoskeletal  (+) Arthritis ,   Abdominal   Peds  Hematology negative hematology ROS (+)   Anesthesia Other Findings Past Medical History: No date: Arthritis No date: Asthma No date: BPH (benign prostatic hyperplasia) No date: Cancer ([place])     Comment: face- basal cell No date: Complication of anesthesia     Comment: after septoplasty and uvulectomy-was in icu-               [facility] - 10 yrs. ago No date: COPD (chronic obstructive pulmonary disease) (* No date: Diabetes mellitus without complication (HCC) No date: ED (erectile dysfunction) No date: Full dentures No date: GERD (gastroesophageal reflux disease)     Comment: no meds No date: Headache     Comment: difficulty with headaches- 1950's , again in               1967, 2008- again problems with migrraines  No date: History of GI bleed No date: History of urethral stricture No date: HOH (hard of hearing) No date: Hypogonadism  in male No date: Joint pain No date: Medial meniscus, posterior horn derangement     Comment: left knee No date: Obesity 2014: Pneumonia     Comment: [facility] No date: Shortness of breath No date: Skin cancer No date: Sleep apnea     Comment: uses a cpap, most nights  1995: Stroke ([place])     Comment: some speech and thought processes slow No date: Traumatic tear of lateral meniscus of right kn*     Comment: right knee No date: Urinary frequency No date: Urinary incontinence  Past Surgical History: 1963: APPENDECTOMY No date: CARPAL TUNNEL RELEASE     Comment: left No date: COLONOSCOPY No date: EYE SURGERY     Comment: foreign body removed fr. eye, ? side  1976: FOOT FOREIGN BODY REMOVAL     Comment: left foot -multiple pieces glass 1995: HEMORROIDECTOMY 01/17/2013: KNEE ARTHROSCOPY Bilateral     Comment: Procedure: ARTHROSCOPY KNEE BILATERAL WITH               MEDIAL AND LATERAL MENISECTOMIES;  Surgeon:               Lorn Junes, MD;  Location: [place];  Service: Orthopedics;                Laterality: Bilateral; 11/05/2014: LUMBAR LAMINECTOMY N/A     Comment: Procedure: LEFT L3-4 MICRODISCECTOMY;  Surgeon: Jessy Oto, MD;  Location: [place];                Service: Orthopedics;  Laterality: N/A; 02/12/2015: LUMBAR LAMINECTOMY/DECOMPRESSION MICRODISCECTO* N/A     Comment: Procedure: RE-DO LEFT L3-4 MICRODISCECTOMY;                Surgeon: Jessy Oto, MD;  Location: [place];                Service: Orthopedics;  Laterality: N/A; 1995: NASAL SEPTUM SURGERY No date: TONSILLECTOMY No date: UPPER GI ENDOSCOPY 1995: UVULOPALATOPHARYNGOPLASTY (UPPP)/TONSILLECTOMY*     Comment: same time with septoplasty-ended up ICU almost              trached     Reproductive/Obstetrics negative OB ROS                             Anesthesia Physical  Anesthesia Plan  ASA: III  Anesthesia Plan: General    Post-op Pain Management:    Induction: Intravenous  PONV Risk Score and Plan: Propofol infusion  Airway Management Planned: Nasal Cannula  Additional Equipment:   Intra-op Plan:   Post-operative Plan:   Informed Consent: I have reviewed the patients History and Physical, chart, labs and discussed the procedure including the risks, benefits and alternatives for the proposed anesthesia with the patient or authorized representative who has indicated his/her understanding and acceptance.     Dental Advisory Given  Plan Discussed with: Anesthesiologist, CRNA and Surgeon  Anesthesia Plan Comments:         Anesthesia Quick Evaluation

## 2019-06-26 NOTE — H&P (Signed)
Outpatient short stay form Pre-procedure 06/26/2019 11:48 AM Eddie Sails MD  Primary Physician: Dr Rinaldo Cloud  Reason for visit: Colonoscopy  History of present illness: Patient is a 78 year old male presenting today for a colonoscopy in regards to his personal history of adenomatous colon polyps as well as some more recent rectal bleeding.  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  He does take Plavix.  He has held that for 7 days.  He takes no other blood thinning agent.  He tolerated his prep well.    Current Facility-Administered Medications:  .  0.9 %  sodium chloride infusion, , Intravenous, Continuous, Eddie Sails, MD, Last Rate: 20 mL/hr at 06/26/19 1137  Facility-Administered Medications Prior to Admission  Medication Dose Route Frequency Provider Last Rate Last Dose  . ciprofloxacin (CIPRO) tablet 500 mg  500 mg Oral Once Bjorn Loser, MD      . lidocaine (XYLOCAINE) 2 % jelly 1 application  1 application Urethral Once MacDiarmid, Nicki Reaper, MD       Medications Prior to Admission  Medication Sig Dispense Refill Last Dose  . acetaminophen (TYLENOL) 500 MG tablet Take 500 mg by mouth every 8 (eight) hours as needed.    06/25/2019 at Unknown time  . budesonide-formoterol (SYMBICORT) 160-4.5 MCG/ACT inhaler Inhale 2 puffs into the lungs 2 (two) times daily. 3 Inhaler 1 06/25/2019 at Unknown time  . buPROPion (WELLBUTRIN XL) 150 MG 24 hr tablet bupropion HCl XL 150 mg 24 hr tablet, extended release   06/25/2019 at Unknown time  . Cholecalciferol (VITAMIN D) 1000 UNITS capsule Take 3,000 Units by mouth daily.   Past Week at Unknown time  . CINNAMON PO Take 4,000 mg by mouth daily at 2 PM.   Past Week at Unknown time  . Coenzyme Q10 (CO Q-10) 100 MG CAPS Take 1 tablet by mouth at bedtime.   Past Week at Unknown time  . donepezil (ARICEPT) 10 MG tablet Take 10 mg by mouth at bedtime.   06/25/2019 at Unknown time  . dutasteride (AVODART) 0.5 MG capsule TAKE 1 CAPSULE BY MOUTH  DAILY 90 capsule 3 06/25/2019 at Unknown time  . fesoterodine (TOVIAZ) 8 MG TB24 tablet Take 1 tablet (8 mg total) by mouth daily. 90 tablet 3 06/25/2019 at Unknown time  . fluticasone (FLONASE) 50 MCG/ACT nasal spray Place 2 sprays into both nostrils daily. 16 g 5 06/25/2019 at Unknown time  . furosemide (LASIX) 20 MG tablet furosemide 20 mg tablet   06/25/2019 at Unknown time  . hydrocortisone 2.5 % lotion    Past Week at Unknown time  . hydrOXYzine (ATARAX/VISTARIL) 10 MG tablet Take 2 tablets by mouth at bedtime.   06/25/2019 at Unknown time  . INCRUSE ELLIPTA 62.5 MCG/INH AEPB    06/25/2019 at Unknown time  . levothyroxine (SYNTHROID, LEVOTHROID) 50 MCG tablet Take 50 mcg by mouth daily. Take on an empty stomach with a glass of water at least 30-60 minutes before breakfast.   06/25/2019 at Unknown time  . lidocaine (LIDODERM) 5 %    06/25/2019 at Unknown time  . linaclotide (LINZESS) 145 MCG CAPS capsule Linzess 145 mcg capsule   Past Week at Unknown time  . Magnesium Citrate 100 MG TABS Take by mouth.   Past Week at Unknown time  . mirabegron ER (MYRBETRIQ) 50 MG TB24 tablet Take 1 tablet (50 mg total) by mouth daily. 90 tablet 3 06/25/2019 at Unknown time  . montelukast (SINGULAIR) 10 MG tablet montelukast  10 mg tablet   06/25/2019 at Unknown time  . Multiple Vitamin (MULTI-VITAMINS) TABS Take by mouth.   Past Week at Unknown time  . RA KRILL OIL 500 MG CAPS Take by mouth.   Past Week at Unknown time  . solifenacin (VESICARE) 10 MG tablet Take 1 tablet (10 mg total) by mouth daily. 90 tablet 3 06/25/2019 at Unknown time  . tamsulosin (FLOMAX) 0.4 MG CAPS capsule TAKE 1 CAPSULE BY MOUTH DAILY 90 capsule 3 06/25/2019 at Unknown time  . topiramate (TOPAMAX) 200 MG tablet Take 1 tablet by mouth daily.   06/25/2019 at Unknown time  . albuterol (PROVENTIL HFA;VENTOLIN HFA) 108 (90 Base) MCG/ACT inhaler Inhale into the lungs.     . clopidogrel (PLAVIX) 75 MG tablet clopidogrel 75 mg tablet   06/19/2019  . diclofenac sodium  (VOLTAREN) 1 % GEL Apply 4 g topically 4 (four) times daily. 5 Tube 4   . DUREZOL 0.05 % EMUL      . EPINEPHrine 0.3 mg/0.3 mL IJ SOAJ injection      . gabapentin (NEURONTIN) 100 MG capsule Take 100 mg twice a day for one week, then increase to 200 mg(2 tablets) twice a day and continue   Not Taking at Unknown time  . HYDROcodone-acetaminophen (NORCO/VICODIN) 5-325 MG tablet hydrocodone 5 mg-acetaminophen 325 mg tablet   Not Taking at Unknown time  . Iodoquinol-HC-Aloe Polysacch (ALCORTIN A) 1-2-1 % GEL Apply topically.     Marland Kitchen MYRBETRIQ 25 MG TB24 tablet TAKE 1 TABLET BY MOUTH DAILY (Patient not taking: Reported on 06/26/2019) 30 tablet 3 Not Taking at Unknown time  . polyethylene glycol (MIRALAX / GLYCOLAX) packet Take 17 g by mouth daily as needed. Reported on 01/21/2016     . pregabalin (LYRICA) 50 MG capsule Lyrica 50 mg capsule   Not Taking at Unknown time  . Testosterone (ANDROGEL PUMP) 20.25 MG/ACT (1.62%) GEL AndroGel 20.25 mg/1.25 gram (1.62 %) transdermal gel pump     . tiotropium (SPIRIVA) 18 MCG inhalation capsule Place 1 capsule (18 mcg total) into inhaler and inhale daily. (Patient not taking: Reported on 06/26/2019) 90 capsule 1 Not Taking at Unknown time  . traMADol (ULTRAM) 50 MG tablet Take 1 tablet (50 mg total) by mouth every 6 (six) hours as needed for moderate pain. (Patient not taking: Reported on 06/26/2019) 40 tablet 0 Not Taking at Unknown time  . UNABLE TO FIND Med Name: Focus Factor        Allergies  Allergen Reactions  . Asa [Aspirin] Shortness Of Breath  . Bee Venom Anaphylaxis  . Nsaids Other (See Comments)    Makes him bleed.  . Tolmetin Other (See Comments)    Makes him bleed.     Past Medical History:  Diagnosis Date  . Arthritis   . Asthma   . BPH (benign prostatic hyperplasia)   . 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 Complication of anesthesia    after septoplasty and uvulectomy-was in icu- [facility] - 10 yrs. ago  . COPD (chronic obstructive  pulmonary disease) ([place])   . Depression   . Diabetes mellitus without complication ([place])   . ED (erectile dysfunction)   . Full dentures   . GERD (gastroesophageal reflux disease)    no meds  . Headache    difficulty with headaches- 1950's , again in 1967, 2008- again problems with migrraines   . History of GI bleed   . History of multiple pulmonary nodules   .  History of urethral stricture   . HOH (hard of hearing)   . HOH (hard of hearing)   . Hypogonadism in male   . Joint pain   . Medial meniscus, posterior horn derangement    left knee  . Melanosis coli   . Obesity   . Pneumonia 2014   [facility]  . Shortness of breath   . Skin cancer   . Sleep apnea    uses a cpap, most nights   . Stroke [facility] 1995   some speech and thought processes slow  . Traumatic tear of lateral meniscus of right knee    right knee  . Urinary frequency   . Urinary incontinence     Review of systems:      Physical Exam    Heart and lungs: Regular rate and rhythm without rub or gallop lungs are bilaterally clear    HEENT: Normocephalic atraumatic eyes are anicteric    Other:    Pertinant exam for procedure: Soft protuberant nondistended bowel sounds positive normoactive nontender    Planned proceedures: Colonoscopy and indicated procedures. I have discussed the risks benefits and complications of procedures to include not limited to bleeding, infection, perforation and the risk of sedation and the patient wishes to proceed.    Eddie Sails, MD Gastroenterology 06/26/2019  11:48 AM

## 2019-06-26 NOTE — Transfer of Care (Signed)
Immediate Anesthesia Transfer of Care Note  Patient: Eddie Carter  Procedure(s) Performed: COLONOSCOPY WITH PROPOFOL (N/A )  Patient Location: PACU  Anesthesia Type:General  Level of Consciousness: awake and oriented  Airway & Oxygen Therapy: Patient Spontanous Breathing  Post-op Assessment: Report given to RN  Post vital signs: stable  Last Vitals:  Vitals Value Taken Time  BP    Temp    Pulse    Resp    SpO2      Last Pain:  Vitals:   06/26/19 1109  TempSrc: Tympanic         Complications: No apparent anesthesia complications

## 2019-06-26 NOTE — Anesthesia Postprocedure Evaluation (Signed)
Anesthesia Post Note  Patient: Eddie Carter  Procedure(s) Performed: COLONOSCOPY WITH PROPOFOL (N/A )  Patient location during evaluation: Endoscopy Anesthesia Type: General Level of consciousness: awake and alert and oriented Pain management: pain level controlled Vital Signs Assessment: post-procedure vital signs reviewed and stable Respiratory status: spontaneous breathing Cardiovascular status: blood pressure returned to baseline Anesthetic complications: no     Last Vitals:  Vitals:   06/26/19 1109 06/26/19 1233  BP: (!) 119/55 (!) 122/58  Pulse: 61   Resp: 18   Temp: 36.6 C 36.7 C  SpO2: 96%     Last Pain:  Vitals:   06/26/19 1233  TempSrc: Tympanic  PainSc: Asleep                 Aadi Bordner

## 2019-06-26 NOTE — Op Note (Signed)
[facility] Gastroenterology Patient Name: Eddie Carter Procedure Date: 06/26/2019 11:03 AM MRN: [id number] Account #: [id number] Date of Birth: [birth year] Admit Type: Outpatient Age: 78 Room: [facility] ENDO ROOM 3 Gender: Male Note Status: Finalized Procedure:            Colonoscopy Indications:          Personal history of colonic polyps Providers:            Lollie Sails, MD Medicines:            Monitored Anesthesia Care Complications:        No immediate complications. Procedure:            Pre-Anesthesia Assessment:                       - ASA Grade Assessment: III - A patient with severe                        systemic disease.                       After obtaining informed consent, the colonoscope was                        passed under direct vision. Throughout the procedure,                        the patient's blood pressure, pulse, and oxygen                        saturations were monitored continuously. The                        Colonoscope was introduced through the anus and                        advanced to the the cecum, identified by appendiceal                        orifice and ileocecal valve. The colonoscopy was                        performed without difficulty. The patient tolerated the                        procedure well. The quality of the bowel preparation                        was good. Findings:      Multiple small-mouthed diverticula were found in the sigmoid colon,       descending colon and transverse colon.      Two sessile polyps were found in the mid sigmoid colon. The polyps were       1 to 2 mm in size. These polyps were removed with a cold biopsy forceps.       Resection and retrieval were complete.      A 2 mm polyp was found in the distal transverse colon. The polyp was       sessile. The polyp was removed with a cold biopsy forceps. Resection and       retrieval were complete.  Three sessile polyps were found in  the proximal ascending colon. The       polyps were 1 to 2 mm in size. These polyps were removed with a cold       biopsy forceps. Resection and retrieval were complete.      A 3 mm polyp was found in the cecum. The polyp was sessile. The polyp       was removed with a cold biopsy forceps. Resection and retrieval were       complete.      Two sessile polyps were found in the rectum. The polyps were 1 to 2 mm       in size. These polyps were removed with a cold biopsy forceps. Resection       and retrieval were complete.      Non-bleeding internal hemorrhoids were found during retroflexion and       during anoscopy. The hemorrhoids were small and Grade I (internal       hemorrhoids that do not prolapse).      No additional abnormalities were found on retroflexion. No evidence of       anal stenosis. Impression:           - Diverticulosis in the sigmoid colon, in the                        descending colon and in the transverse colon.                       - Two 1 to 2 mm polyps in the mid sigmoid colon,                        removed with a cold biopsy forceps. Resected and                        retrieved.                       - One 2 mm polyp in the distal transverse colon,                        removed with a cold biopsy forceps. Resected and                        retrieved.                       - Three 1 to 2 mm polyps in the proximal ascending                        colon, removed with a cold biopsy forceps. Resected and                        retrieved.                       - One 3 mm polyp in the cecum, removed with a cold                        biopsy forceps. Resected and retrieved.                       -  Two 1 to 2 mm polyps in the rectum, removed with a                        cold biopsy forceps. Resected and retrieved.                       - Non-bleeding internal hemorrhoids. Recommendation:       - Discharge patient to home.                       - Await pathology  results.                       - Telephone GI clinic for pathology results in 5 days.                       - Continue present medications.                       - Return to GI clinic in 3 weeks. Procedure Code(s):    --- Professional ---                       [phone], Colonoscopy, flexible; with biopsy, single or                        multiple Diagnosis Code(s):    --- Professional ---                       K64.0, First degree hemorrhoids                       K63.5, Polyp of colon                       K62.1, Rectal polyp                       Z86.010, Personal history of colonic polyps                       K57.30, Diverticulosis of large intestine without                        perforation or abscess without bleeding CPT copyright 2019 American Medical Association. All rights reserved. The codes documented in this report are preliminary and upon coder review may  be revised to meet current compliance requirements. Lollie Sails, MD 06/26/2019 12:38:35 PM This report has been signed electronically. Number of Addenda: 0 Note Initiated On: 06/26/2019 11:03 AM Scope Withdrawal Time: 0 hours 8 minutes 37 seconds  Total Procedure Duration: 0 hours 26 minutes 11 seconds       [facility]

## 2019-06-27 ENCOUNTER — Encounter: Payer: Self-pay | Admitting: Gastroenterology

## 2019-06-28 LAB — SURGICAL PATHOLOGY

## 2019-07-10 ENCOUNTER — Encounter: Payer: Self-pay | Admitting: Podiatry

## 2019-07-10 ENCOUNTER — Ambulatory Visit (INDEPENDENT_AMBULATORY_CARE_PROVIDER_SITE_OTHER): Payer: Managed Care, Other (non HMO) | Admitting: Podiatry

## 2019-07-10 ENCOUNTER — Other Ambulatory Visit: Payer: Self-pay

## 2019-07-10 DIAGNOSIS — M79674 Pain in right toe(s): Secondary | ICD-10-CM | POA: Diagnosis not present

## 2019-07-10 DIAGNOSIS — M79675 Pain in left toe(s): Secondary | ICD-10-CM

## 2019-07-10 DIAGNOSIS — D689 Coagulation defect, unspecified: Secondary | ICD-10-CM | POA: Insufficient documentation

## 2019-07-10 DIAGNOSIS — B351 Tinea unguium: Secondary | ICD-10-CM | POA: Diagnosis not present

## 2019-07-10 NOTE — Progress Notes (Signed)
Complaint:  Visit Type: Patient presents  to my office for  preventative foot care services. Complaint: Patient states" my nails have grown long and thick and become painful to walk and wear shoes" Patient states he was incorrectly diagnosed with diabetes.  The patient presents for preventative foot care services. No changes to ROS.  Patient is taking plavix.  Podiatric Exam: Vascular: dorsalis pedis and posterior tibial pulses are palpable bilateral. Capillary return is immediate. Temperature gradient is WNL. Skin turgor WNL .  Venous stasis legs  B/L. Sensorium: Normal Semmes Weinstein monofilament test. Normal tactile sensation bilaterally. Nail Exam: Pt has thick disfigured discolored nails with subungual debris noted bilateral entire nail hallux through fifth toenails Ulcer Exam: There is no evidence of ulcer or pre-ulcerative changes or infection. Orthopedic Exam: Muscle tone and strength are WNL. No limitations in general ROM. No crepitus or effusions noted. Foot type and digits show no abnormalities. Bony prominences are unremarkable. Skin: No Porokeratosis. No infection or ulcers.  Brown venous stasis dorsum feet B/L  Diagnosis:  Onychomycosis, , Pain in right toe, pain in left toes  Treatment & Plan Procedures and Treatment: Consent by patient was obtained for treatment procedures.   Debridement of mycotic and hypertrophic toenails, 1 through 5 bilateral and clearing of subungual debris. No ulceration, no infection noted.  Return Visit-Office Procedure: Patient instructed to return to the office for a follow up visit 3 months for continued evaluation and treatment.    Gardiner Barefoot DPM

## 2019-07-18 ENCOUNTER — Telehealth: Payer: Self-pay

## 2019-07-18 NOTE — Telephone Encounter (Signed)
Fax from [employer] stating they do not cover Lisbeth Ply can he try another medication or should he make an appointment to follow up?

## 2019-07-18 NOTE — Telephone Encounter (Signed)
Oxy ER 10 mg if has not had it thanks

## 2019-07-21 MED ORDER — OXYBUTYNIN CHLORIDE ER 10 MG PO TB24: 10.0000 mg | ORAL_TABLET | Freq: Every day | ORAL | 11 refills | Status: DC

## 2019-07-21 NOTE — Telephone Encounter (Signed)
Script sent to pharmacy, called to notify patient and he was very confused and states he is getting his Lisbeth Ply and would like a lesser dose 4mg  instead of 8mg . He then became agitated and confused and it was asked if his wife or daughter was available and he stated no.  Will call back and speak to wife or daughter to try and explain Lisbeth Ply is not covered

## 2019-11-10 ENCOUNTER — Encounter: Payer: Self-pay | Admitting: Ophthalmology

## 2019-11-20 ENCOUNTER — Other Ambulatory Visit: Admission: RE | Admit: 2019-11-20 | Payer: Managed Care, Other (non HMO) | Source: Ambulatory Visit

## 2019-11-21 ENCOUNTER — Other Ambulatory Visit
Admission: RE | Admit: 2019-11-21 | Discharge: 2019-11-21 | Disposition: A | Discharge status: Home / Self Care | Payer: Managed Care, Other (non HMO) | Source: Ambulatory Visit | Attending: Ophthalmology | Admitting: Ophthalmology

## 2019-11-21 ENCOUNTER — Other Ambulatory Visit: Payer: Self-pay

## 2019-11-21 DIAGNOSIS — Z20828 Contact with and (suspected) exposure to other viral communicable diseases: Secondary | ICD-10-CM | POA: Diagnosis not present

## 2019-11-21 DIAGNOSIS — Z01812 Encounter for preprocedural laboratory examination: Secondary | ICD-10-CM | POA: Insufficient documentation

## 2019-11-21 LAB — SARS CORONAVIRUS 2 (TAT 6-24 HRS): SARS Coronavirus 2: NEGATIVE

## 2019-11-23 ENCOUNTER — Encounter
Admission: RE | Disposition: A | Discharge status: Home / Self Care | Payer: Self-pay | Source: Home / Self Care | Attending: Ophthalmology

## 2019-11-23 ENCOUNTER — Ambulatory Visit
Admission: RE | Admit: 2019-11-23 | Discharge: 2019-11-23 | Disposition: A | Discharge status: Home / Self Care | Payer: Managed Care, Other (non HMO) | Attending: Ophthalmology | Admitting: Ophthalmology

## 2019-11-23 ENCOUNTER — Ambulatory Visit: Payer: Managed Care, Other (non HMO) | Admitting: Registered Nurse

## 2019-11-23 ENCOUNTER — Encounter: Payer: Self-pay | Admitting: Ophthalmology

## 2019-11-23 ENCOUNTER — Other Ambulatory Visit: Payer: Self-pay

## 2019-11-23 DIAGNOSIS — Z79890 Hormone replacement therapy (postmenopausal): Secondary | ICD-10-CM | POA: Diagnosis not present

## 2019-11-23 DIAGNOSIS — H2511 Age-related nuclear cataract, right eye: Secondary | ICD-10-CM | POA: Insufficient documentation

## 2019-11-23 DIAGNOSIS — N400 Enlarged prostate without lower urinary tract symptoms: Secondary | ICD-10-CM | POA: Diagnosis not present

## 2019-11-23 DIAGNOSIS — Z85828 Personal history of other malignant neoplasm of skin: Secondary | ICD-10-CM | POA: Diagnosis not present

## 2019-11-23 DIAGNOSIS — Z7902 Long term (current) use of antithrombotics/antiplatelets: Secondary | ICD-10-CM | POA: Insufficient documentation

## 2019-11-23 DIAGNOSIS — E1136 Type 2 diabetes mellitus with diabetic cataract: Secondary | ICD-10-CM | POA: Insufficient documentation

## 2019-11-23 DIAGNOSIS — J449 Chronic obstructive pulmonary disease, unspecified: Secondary | ICD-10-CM | POA: Diagnosis not present

## 2019-11-23 DIAGNOSIS — Z87891 Personal history of nicotine dependence: Secondary | ICD-10-CM | POA: Diagnosis not present

## 2019-11-23 DIAGNOSIS — Z8673 Personal history of transient ischemic attack (TIA), and cerebral infarction without residual deficits: Secondary | ICD-10-CM | POA: Insufficient documentation

## 2019-11-23 DIAGNOSIS — Z7951 Long term (current) use of inhaled steroids: Secondary | ICD-10-CM | POA: Diagnosis not present

## 2019-11-23 DIAGNOSIS — G4730 Sleep apnea, unspecified: Secondary | ICD-10-CM | POA: Insufficient documentation

## 2019-11-23 HISTORY — DX: Personal history of other infectious and parasitic diseases: Z86.19

## 2019-11-23 HISTORY — PX: CATARACT EXTRACTION W/PHACO: SHX586

## 2019-11-23 HISTORY — DX: Irritable bowel syndrome, unspecified: K58.9

## 2019-11-23 HISTORY — DX: Diverticulitis of intestine, part unspecified, without perforation or abscess without bleeding: K57.92

## 2019-11-23 SURGERY — PHACOEMULSIFICATION, CATARACT, WITH IOL INSERTION
Anesthesia: Monitor Anesthesia Care | Site: Eye | Laterality: Right

## 2019-11-23 MED ORDER — MOXIFLOXACIN HCL 0.5 % OP SOLN
OPHTHALMIC | Status: AC
  Filled 2019-11-23: qty 3

## 2019-11-23 MED ORDER — FENTANYL CITRATE (PF) 100 MCG/2ML IJ SOLN
INTRAMUSCULAR | Status: DC | PRN
  Administered 2019-11-23: 25 ug via INTRAVENOUS
  Administered 2019-11-23: 50 ug via INTRAVENOUS

## 2019-11-23 MED ORDER — ARMC OPHTHALMIC DILATING DROPS
1.0000 "application " | OPHTHALMIC | Status: AC
  Administered 2019-11-23: 1 via OPHTHALMIC

## 2019-11-23 MED ORDER — TETRACAINE HCL 0.5 % OP SOLN: 1.0000 [drp] | OPHTHALMIC | Status: DC | PRN

## 2019-11-23 MED ORDER — DEXMEDETOMIDINE HCL 200 MCG/2ML IV SOLN
INTRAVENOUS | Status: DC | PRN
  Administered 2019-11-23: 8 ug via INTRAVENOUS

## 2019-11-23 MED ORDER — ONDANSETRON HCL 4 MG/2ML IJ SOLN
INTRAMUSCULAR | Status: DC | PRN
  Administered 2019-11-23: 4 mg via INTRAVENOUS

## 2019-11-23 MED ORDER — CARBACHOL 0.01 % IO SOLN
INTRAOCULAR | Status: DC | PRN
  Administered 2019-11-23: 0.5 mL via INTRAOCULAR

## 2019-11-23 MED ORDER — FENTANYL CITRATE (PF) 100 MCG/2ML IJ SOLN
INTRAMUSCULAR | Status: AC
  Filled 2019-11-23: qty 2

## 2019-11-23 MED ORDER — MIDAZOLAM HCL 2 MG/2ML IJ SOLN
INTRAMUSCULAR | Status: DC | PRN
  Administered 2019-11-23: 1 mg via INTRAVENOUS

## 2019-11-23 MED ORDER — MOXIFLOXACIN HCL 0.5 % OP SOLN - NO CHARGE
1.0000 [drp] | Freq: Once | OPHTHALMIC | Status: DC
  Filled 2019-11-23: qty 3

## 2019-11-23 MED ORDER — LIDOCAINE HCL (PF) 4 % IJ SOLN
INTRAOCULAR | Status: DC | PRN
  Administered 2019-11-23: 4 mL via OPHTHALMIC

## 2019-11-23 MED ORDER — POVIDONE-IODINE 5 % OP SOLN
OPHTHALMIC | Status: DC | PRN
  Administered 2019-11-23: 1 via OPHTHALMIC

## 2019-11-23 MED ORDER — ONDANSETRON HCL 4 MG/2ML IJ SOLN
INTRAMUSCULAR | Status: AC
  Filled 2019-11-23: qty 2

## 2019-11-23 MED ORDER — MOXIFLOXACIN HCL 0.5 % OP SOLN
OPHTHALMIC | Status: DC | PRN
  Administered 2019-11-23: 0.2 mL via OPHTHALMIC

## 2019-11-23 MED ORDER — SODIUM CHLORIDE 0.9 % IV SOLN: INTRAVENOUS | Status: DC

## 2019-11-23 MED ORDER — NA CHONDROIT SULF-NA HYALURON 40-17 MG/ML IO SOLN
INTRAOCULAR | Status: DC | PRN
  Administered 2019-11-23: 1 mL via INTRAOCULAR

## 2019-11-23 MED ORDER — MIDAZOLAM HCL 2 MG/2ML IJ SOLN
INTRAMUSCULAR | Status: AC
  Filled 2019-11-23: qty 2

## 2019-11-23 MED ORDER — EPINEPHRINE PF 1 MG/ML IJ SOLN: INTRAOCULAR | Status: DC | PRN

## 2019-11-23 MED ORDER — ARMC OPHTHALMIC DILATING DROPS
OPHTHALMIC | Status: AC
  Administered 2019-11-23: 1 via OPHTHALMIC
  Filled 2019-11-23: qty 0.5

## 2019-11-23 MED ORDER — TETRACAINE HCL 0.5 % OP SOLN
OPHTHALMIC | Status: AC
  Administered 2019-11-23: 1 [drp] via OPHTHALMIC
  Filled 2019-11-23: qty 4

## 2019-11-23 SURGICAL SUPPLY — 16 items
GLOVE BIO SURGEON STRL SZ8 (GLOVE) ×2 IMPLANT
GLOVE BIOGEL M 6.5 STRL (GLOVE) ×2 IMPLANT
GLOVE SURG LX 8.0 MICRO (GLOVE) ×1
GLOVE SURG LX STRL 8.0 MICRO (GLOVE) ×1 IMPLANT
GOWN STRL REUS W/ TWL LRG LVL3 (GOWN DISPOSABLE) ×2 IMPLANT
GOWN STRL REUS W/TWL LRG LVL3 (GOWN DISPOSABLE) ×2
LABEL CATARACT MEDS ST (LABEL) ×2 IMPLANT
LENS IOL TECNIS ITEC 19.5 (Intraocular Lens) ×2 IMPLANT
PACK CATARACT (MISCELLANEOUS) ×2 IMPLANT
PACK CATARACT BRASINGTON LX (MISCELLANEOUS) ×2 IMPLANT
PACK EYE AFTER SURG (MISCELLANEOUS) ×2 IMPLANT
SOL BSS BAG (MISCELLANEOUS) ×2
SOLUTION BSS BAG (MISCELLANEOUS) ×1 IMPLANT
SYR 5ML LL (SYRINGE) ×2 IMPLANT
WATER STERILE IRR 250ML POUR (IV SOLUTION) ×2 IMPLANT
WIPE NON LINTING 3.25X3.25 (MISCELLANEOUS) ×2 IMPLANT

## 2019-11-23 NOTE — Transfer of Care (Signed)
Immediate Anesthesia Transfer of Care Note  Patient: Eddie Carter  Procedure(s) Performed: CATARACT EXTRACTION PHACO AND INTRAOCULAR LENS PLACEMENT (IOC) RIGHT (Right Eye)  Patient Location: PACU  Anesthesia Type:MAC  Level of Consciousness: awake, drowsy and patient cooperative  Airway & Oxygen Therapy: Patient Spontanous Breathing  Post-op Assessment: Report given to RN and Post -op Vital signs reviewed and stable  Post vital signs: Reviewed and stable  Last Vitals:  Vitals Value Taken Time  BP    Temp    Pulse    Resp    SpO2      Last Pain:  Vitals:   11/23/19 1005  TempSrc: Temporal  PainSc: 6          Complications: No apparent anesthesia complications

## 2019-11-23 NOTE — Anesthesia Postprocedure Evaluation (Signed)
Anesthesia Post Note  Patient: Eddie Carter  Procedure(s) Performed: CATARACT EXTRACTION PHACO AND INTRAOCULAR LENS PLACEMENT (IOC) RIGHT (Right Eye)  Patient location during evaluation: Phase II Anesthesia Type: MAC Level of consciousness: awake and alert Pain management: pain level controlled Vital Signs Assessment: post-procedure vital signs reviewed and stable Respiratory status: spontaneous breathing, nonlabored ventilation and respiratory function stable Cardiovascular status: blood pressure returned to baseline and stable Postop Assessment: no apparent nausea or vomiting Anesthetic complications: no     Last Vitals:  Vitals:   11/23/19 1008 11/23/19 1114  BP: 137/87 135/85  Pulse: 77 67  Resp: 14 16  Temp:  (!) 36.4 C  SpO2: 94%     Last Pain:  Vitals:   11/23/19 1114  TempSrc:   PainSc: 0-No pain                 Alphonsus Sias

## 2019-11-23 NOTE — H&P (Signed)
All labs reviewed. Abnormal studies sent to patients PCP when indicated.  Previous H&P reviewed, patient examined, there are NO CHANGES.  Eddie Carter Porfilio12/31/202010:40 AM

## 2019-11-23 NOTE — Anesthesia Post-op Follow-up Note (Signed)
Anesthesia QCDR form completed.        

## 2019-11-23 NOTE — Op Note (Signed)
PREOPERATIVE DIAGNOSIS:  Nuclear sclerotic cataract of the right eye.   POSTOPERATIVE DIAGNOSIS:  NUCLEAR SCLEROTIC CATARACT RIGHT EYE   OPERATIVE PROCEDURE: Procedure(s): CATARACT EXTRACTION PHACO AND INTRAOCULAR LENS PLACEMENT (IOC) RIGHT   SURGEON:  Birder Robson, MD.   ANESTHESIA:  Anesthesiologist: Alphonsus Sias, MD CRNA: Lia Foyer, CRNA  1.      Managed anesthesia care. 2.      0.57ml of Shugarcaine was instilled in the eye following the paracentesis.   COMPLICATIONS:  None.   TECHNIQUE:   Stop and chop   DESCRIPTION OF PROCEDURE:  The patient was examined and consented in the preoperative holding area where the aforementioned topical anesthesia was applied to the right eye and then brought back to the Operating Room where the right eye was prepped and draped in the usual sterile ophthalmic fashion and a lid speculum was placed. A paracentesis was created with the side port blade and the anterior chamber was filled with viscoelastic. A near clear corneal incision was performed with the steel keratome. A continuous curvilinear capsulorrhexis was performed with a cystotome followed by the capsulorrhexis forceps. Hydrodissection and hydrodelineation were carried out with BSS on a blunt cannula. The lens was removed in a stop and chop  technique and the remaining cortical material was removed with the irrigation-aspiration handpiece. The capsular bag was inflated with viscoelastic and the Technis [id number]  lens was placed in the capsular bag without complication. The remaining viscoelastic was removed from the eye with the irrigation-aspiration handpiece. The wounds were hydrated. The anterior chamber was flushed with Miostat and the eye was inflated to physiologic pressure. 0.6ml of Vigamox was placed in the anterior chamber. The wounds were found to be water tight. The eye was dressed with Vigamox. The patient was given protective glasses to wear throughout the day and a shield with  which to sleep tonight. The patient was also given drops with which to begin a drop regimen today and will follow-up with me in one day. Implant Name Type Inv. Item Serial No. Manufacturer Lot No. LRB No. Used Action  LENS IOL DIOP 19.5 - [id number] Intraocular Lens LENS IOL DIOP 19.5 [id number] 2001 [place]  Right 1 Implanted   Procedure(s) with comments: CATARACT EXTRACTION PHACO AND INTRAOCULAR LENS PLACEMENT (IOC) RIGHT (Right) - [place] 01:10.1 CDE 10.25 Fluid Pack Lot # [id number]  Electronically signed: Birder Robson 11/23/2019 11:10 AM

## 2019-11-23 NOTE — Anesthesia Preprocedure Evaluation (Addendum)
Anesthesia Evaluation  Patient identified by MRN, date of birth, ID band Patient awake    Reviewed: Allergy & Precautions, H&P , NPO status , reviewed documented beta blocker date and time   History of Anesthesia Complications (+) history of anesthetic complications  Airway Mallampati: III  TM Distance: <3 FB     Dental  (+) Upper Dentures, Lower Dentures   Pulmonary shortness of breath, asthma , sleep apnea , pneumonia, resolved, COPD, former smoker,     + decreased breath sounds      Cardiovascular Normal cardiovascular exam     Neuro/Psych  Headaches, PSYCHIATRIC DISORDERS Depression CVA    GI/Hepatic GERD  Controlled,  Endo/Other  diabetes  Renal/GU      Musculoskeletal  (+) Arthritis ,   Abdominal   Peds  Hematology   Anesthesia Other Findings Past Medical History: No date: Arthritis No date: Asthma No date: BPH (benign prostatic hyperplasia) No date: Cancer ([place])     Comment:  face- basal cell No date: Complication of anesthesia     Comment:  after septoplasty and uvulectomy-was in [facility]u- [facility] - 10 yrs. ago No date: COPD (chronic obstructive pulmonary disease) (HCC) No date: Depression No date: Diabetes mellitus without complication (HCC) No date: Diverticulitis No date: ED (erectile dysfunction) No date: Full dentures No date: GERD (gastroesophageal reflux disease)     Comment:  no meds No date: Headache     Comment:  difficulty with headaches- 1950's , again in 1967, 2008-              again problems with migrraines  No date: History of GI bleed No date: History of multiple pulmonary nodules No date: History of shingles No date: History of urethral stricture No date: HOH (hard of hearing) No date: HOH (hard of hearing) No date: Hypogonadism in male No date: IBS (irritable bowel syndrome) No date: Joint pain No date: Medial meniscus, posterior horn derangement  Comment:  left knee No date: Melanosis coli No date: Obesity 2014: Pneumonia     Comment:  [facility] No date: Shortness of breath No date: Skin cancer No date: Sleep apnea     Comment:  uses a cpap, most nights  1995: Stroke ([place])     Comment:  some speech and thought processes slow No date: Traumatic tear of lateral meniscus of right knee     Comment:  right knee No date: Urinary frequency No date: Urinary incontinence  Past Surgical History: 1963: APPENDECTOMY No date: BACK SURGERY No date: CARPAL TUNNEL RELEASE     Comment:  left 09/13/2018: CATARACT EXTRACTION W/PHACO; Left     Comment:  Procedure: CATARACT EXTRACTION PHACO AND INTRAOCULAR               LENS PLACEMENT (IOC);  Surgeon: Birder Robson, MD;                Location: [facility] ORS;  Service: Ophthalmology;  Laterality:              Left;  [place]  00:42 CDE 6.73 Fluid pack lot #[id number] No date: COLONOSCOPY 12/07/2016: COLONOSCOPY WITH PROPOFOL; N/A     Comment:  Procedure: COLONOSCOPY WITH PROPOFOL;  Surgeon: Lollie Sails, MD;  [facility]: [facility] ENDOSCOPY;  Service:  Endoscopy;  Laterality: N/A; 06/26/2019: COLONOSCOPY WITH PROPOFOL; N/A     Comment:  Procedure: COLONOSCOPY WITH PROPOFOL;  Surgeon:               Lollie Sails, MD;  Location: [facility] ENDOSCOPY;                Service: Endoscopy;  Laterality: N/A; No date: EYE SURGERY     Comment:  foreign body removed fr. eye, ? side  1976: FOOT FOREIGN BODY REMOVAL     Comment:  left foot -multiple pieces glass 1995: HEMORROIDECTOMY 01/17/2013: KNEE ARTHROSCOPY; Bilateral     Comment:  Procedure: ARTHROSCOPY KNEE BILATERAL WITH MEDIAL AND               LATERAL MENISECTOMIES;  Surgeon: Lorn Junes, MD;                Location: [place];  Service:               Orthopedics;  Laterality: Bilateral; 07/23/2017: KYPHOPLASTY; N/A     Comment:  Procedure: KYPHOPLASTY- L1;  Surgeon: [place]ie Knows, MD;              Location:  [facility] ORS;  Service: Orthopedics;  Laterality:               N/A; 11/05/2014: LUMBAR LAMINECTOMY; N/A     Comment:  Procedure: LEFT L3-4 MICRODISCECTOMY;  Surgeon: Jessy Oto, MD;  Location: [place];  Service: Orthopedics;                Laterality: N/A; 02/12/2015: LUMBAR LAMINECTOMY/DECOMPRESSION MICRODISCECTOMY; N/A     Comment:  Procedure: RE-DO LEFT L3-4 MICRODISCECTOMY;  Surgeon:               Jessy Oto, MD;  Location: [place];  Service:               Orthopedics;  Laterality: N/A; 1995: NASAL SEPTUM SURGERY No date: TONSILLECTOMY No date: UPPER GI ENDOSCOPY 1995: UVULOPALATOPHARYNGOPLASTY (UPPP)/TONSILLECTOMY/SEPTOPLASTY     Comment:  same time with septoplasty-ended up ICU almost trached  BMI    Body Mass Index: 33.91 kg/m      Reproductive/Obstetrics                            Anesthesia Physical Anesthesia Plan  ASA: IV  Anesthesia Plan: MAC   Post-op Pain Management:    Induction: Intravenous  PONV Risk Score and Plan: Treatment may vary due to age or medical condition and TIVA  Airway Management Planned: Nasal Cannula and Natural Airway  Additional Equipment:   Intra-op Plan:   Post-operative Plan:   Informed Consent: I have reviewed the patients History and Physical, chart, labs and discussed the procedure including the risks, benefits and alternatives for the proposed anesthesia with the patient or authorized representative who has indicated his/her understanding and acceptance.     Dental Advisory Given  Plan Discussed with: CRNA  Anesthesia Plan Comments:        Anesthesia Quick Evaluation

## 2019-11-23 NOTE — Discharge Instructions (Signed)

## 2019-12-22 ENCOUNTER — Other Ambulatory Visit: Payer: Self-pay | Admitting: Urology

## 2019-12-24 ENCOUNTER — Other Ambulatory Visit: Payer: Self-pay | Admitting: Urology

## 2020-02-12 ENCOUNTER — Telehealth: Payer: Self-pay

## 2020-02-12 NOTE — Telephone Encounter (Signed)
Pt called he took a fall 4 days ago, his elbow is bruised, pt is at the pharmacy right now he is going to try otc treatment the pharmacist recommended and he will call back here in a few days if he think he need to be worked in the schedule

## 2020-02-20 ENCOUNTER — Encounter (INDEPENDENT_AMBULATORY_CARE_PROVIDER_SITE_OTHER): Payer: Self-pay

## 2020-02-20 ENCOUNTER — Other Ambulatory Visit: Payer: Self-pay

## 2020-02-20 ENCOUNTER — Encounter: Payer: Self-pay | Admitting: Urology

## 2020-02-20 ENCOUNTER — Ambulatory Visit (INDEPENDENT_AMBULATORY_CARE_PROVIDER_SITE_OTHER): Payer: Medicare PPO | Admitting: Urology

## 2020-02-20 VITALS — BP 138/79 | HR 75 | Ht 72.0 in | Wt 250.0 lb

## 2020-02-20 DIAGNOSIS — R300 Dysuria: Secondary | ICD-10-CM | POA: Diagnosis not present

## 2020-02-20 DIAGNOSIS — N401 Enlarged prostate with lower urinary tract symptoms: Secondary | ICD-10-CM | POA: Diagnosis not present

## 2020-02-20 DIAGNOSIS — N3946 Mixed incontinence: Secondary | ICD-10-CM

## 2020-02-20 DIAGNOSIS — N3941 Urge incontinence: Secondary | ICD-10-CM | POA: Diagnosis not present

## 2020-02-20 DIAGNOSIS — N138 Other obstructive and reflux uropathy: Secondary | ICD-10-CM

## 2020-02-20 LAB — URINALYSIS, COMPLETE
Bilirubin, UA: NEGATIVE
Glucose, UA: NEGATIVE
Leukocytes,UA: NEGATIVE
Nitrite, UA: NEGATIVE
Protein,UA: NEGATIVE
RBC, UA: NEGATIVE
Specific Gravity, UA: >1.03 — ABNORMAL HIGH (ref 1.005–1.030)
Urobilinogen, Ur: 0.2 mg/dL (ref 0.2–1.0)
pH, UA: 5 (ref 5.0–7.5)

## 2020-02-20 LAB — MICROSCOPIC EXAMINATION: RBC, Urine: NONE SEEN /hpf (ref 0–2)

## 2020-02-20 LAB — BLADDER SCAN AMB NON-IMAGING: Scan Result: 11

## 2020-02-20 MED ORDER — FESOTERODINE FUMARATE ER 8 MG PO TB24: 8.0000 mg | ORAL_TABLET | Freq: Every day | ORAL | 3 refills | Status: DC

## 2020-02-20 MED ORDER — SULFAMETHOXAZOLE-TRIMETHOPRIM 800-160 MG PO TABS: 1.0000 | ORAL_TABLET | Freq: Two times a day (BID) | ORAL | 0 refills | Status: DC

## 2020-02-20 NOTE — Progress Notes (Signed)
02/20/2020 3:45 PM   Eddie Carter [birth year] [id number]  Referring provider: [name], MD [place],  [place] [address]  Chief Complaint  Patient presents with  . Medication Refill    HPI: [name] is a 79 year old male with BPH with LU TS, history of urethral strictures and urinary incontinence who presents today requesting a medication refill.  Cystoscopy in 04/2018 with Dr. Matilde Carter noted he had minimal stricture disease but the scope easily passed.  He had mild bilobar enlargement of the prostate.  He had some lengthening of the prostatic urethra.  He a grade 1 of 4 bladder trabeculation.    Today, he states that he has baseline urge incontinence, but it is worsening.  He is in need of a refill of his Toviaz.  He had also been on Myrbetriq in the past, but he is not taking that medication at this time.  He has been experiencing dysuria for one week.  Patient denies any modifying or aggravating factors.  Patient denies any gross hematuria or suprapubic/flank pain.  Patient denies any fevers, chills, nausea or vomiting.   His UA today is yellow cloudy, trace ketones, specific gravity 1.030, pH 5.0, 0-5 WBC's, 0-10 epithelial cells, hyaline casts present and moderate bacteria.    PMH: Past Medical History:  Diagnosis Date  . Arthritis   . Asthma   . BPH (benign prostatic hyperplasia)   . Cancer ([place])    face- basal cell  . Complication of anesthesia    after septoplasty and uvulectomy-was in icu-  [facility] - 10 yrs. ago  . COPD (chronic obstructive pulmonary disease) ([place])   . Depression   . Diabetes mellitus without complication ([place])   . Diverticulitis   . ED (erectile dysfunction)   . Full dentures   . GERD (gastroesophageal reflux disease)    no meds  . Headache    difficulty with headaches- 1950's , again in 1967, 2008- again problems with migrraines   . History of GI bleed   . History of multiple pulmonary nodules   . History  of shingles   . History of urethral stricture   . HOH (hard of hearing)   . HOH (hard of hearing)   . Hypogonadism in male   . IBS (irritable bowel syndrome)   . Joint pain   . Medial meniscus, posterior horn derangement    left knee  . Melanosis coli   . Obesity   . Pneumonia 2014   [facility]  . Shortness of breath   . Skin cancer   . Sleep apnea    uses a cpap, most nights   . Stroke [facility] 1995   some speech and thought processes slow  . Traumatic tear of lateral meniscus of right knee    right knee  . Urinary frequency   . Urinary incontinence     Surgical History: Past Surgical History:  Procedure Laterality Date  . APPENDECTOMY  1963  . BACK SURGERY    . CARPAL TUNNEL RELEASE     left  . CATARACT EXTRACTION W/PHACO Left 09/13/2018   Procedure: CATARACT EXTRACTION PHACO AND INTRAOCULAR LENS PLACEMENT (IOC);  Surgeon: Birder Robson, MD;  Location: [facility] ORS;  Service: Ophthalmology;  Laterality: Left;  [place]  00:42 CDE 6.73 Fluid pack lot #@ [id number]  . CATARACT EXTRACTION W/PHACO Right 11/23/2019   Procedure: CATARACT EXTRACTION PHACO AND INTRAOCULAR LENS PLACEMENT ([place]) RIGHT;  Surgeon: Birder Robson, MD;  Location: [facility] ORS;  Service: Ophthalmology;  Laterality: Right;  [place] 01:10.1 CDE 10.25 Fluid Pack Lot # [id number]  . COLONOSCOPY    . COLONOSCOPY WITH PROPOFOL N/A 12/07/2016   Procedure: COLONOSCOPY WITH PROPOFOL;  Surgeon: Lollie Sails, MD;  Location[facility] ENDOSCOPY;  Service: Endoscopy;  Laterality: N/A;  . COLONOSCOPY WITH PROPOFOL N/A 06/26/2019   Procedure: COLONOSCOPY WITH PROPOFOL;  Surgeon: Lollie Sails, MD;  Location: [facility] ENDOSCOPY;  Service: Endoscopy;  Laterality: N/A;  . EYE SURGERY     foreign body removed fr. eye, ? side   . FOOT FOREIGN BODY REMOVAL  1976   left foot -multiple pieces glass  . HEMORROIDECTOMY  1995  . KNEE ARTHROSCOPY Bilateral 01/17/2013   Procedure: ARTHROSCOPY KNEE BILATERAL WITH MEDIAL AND LATERAL MENISECTOMIES;   Surgeon: Lorn Junes, MD;  Location: [place];  Service: Orthopedics;  Laterality: Bilateral;  . KYPHOPLASTY N/A 07/23/2017   Procedure: KYPHOPLASTY- L1;  Surgeon: [place]ie Knows, MD;  Location: [facility] ORS;  Service: Orthopedics;  Laterality: N/A;  . LUMBAR LAMINECTOMY N/A 11/05/2014   Procedure: LEFT L3-4 MICRODISCECTOMY;  Surgeon: Jessy Oto, MD;  Location: [place];  Service: Orthopedics;  Laterality: N/A;  . LUMBAR LAMINECTOMY/DECOMPRESSION MICRODISCECTOMY N/A 02/12/2015   Procedure: RE-DO LEFT L3-4 MICRODISCECTOMY;  Surgeon: Jessy Oto, MD;  Location: [place];  Service: Orthopedics;  Laterality: N/A;  . NASAL SEPTUM SURGERY  1995  . TONSILLECTOMY    . UPPER GI ENDOSCOPY    . UVULOPALATOPHARYNGOPLASTY (UPPP)/TONSILLECTOMY/SEPTOPLASTY  1995   same time with septoplasty-ended up ICU almost trached    Home Medications:  Allergies as of 02/20/2020      Reactions   Asa [aspirin] Shortness Of Breath   Bee Venom Anaphylaxis   Nsaids Other (See Comments)   Makes him bleed.   Tolmetin Other (See Comments)   Makes him bleed.      Medication List       Accurate as of February 20, 2020 11:59 PM. If you have any questions, ask your nurse or doctor.        STOP taking these medications   donepezil 10 MG tablet Commonly known as: ARICEPT Stopped by: Zara Council, PA-C   mirabegron ER 50 MG Tb24 tablet Commonly known as: MYRBETRIQ Stopped by: Ervey Fallin, PA-C   Myrbetriq 25 MG Tb24 tablet Generic drug: mirabegron ER Stopped by: Larene Beach Jarica Plass, PA-C   solifenacin 10 MG tablet Commonly known as: VESIcare Stopped by: Abu Heavin, PA-C   topiramate 50 MG tablet Commonly known as: TOPAMAX Stopped by: Zara Council, PA-C     TAKE these medications   acetaminophen 500 MG tablet Commonly known as: TYLENOL Take 1,000 mg by mouth every 6 (six) hours as needed for moderate pain.   albuterol 108 (90 Base) MCG/ACT inhaler Commonly known as: VENTOLIN  HFA Inhale 1 puff into the lungs every 6 (six) hours as needed for wheezing or shortness of breath.   budesonide-formoterol 160-4.5 MCG/ACT inhaler Commonly known as: SYMBICORT Inhale 2 puffs into the lungs 2 (two) times daily.   budesonide-formoterol 160-4.5 MCG/ACT inhaler Commonly known as: SYMBICORT Inhale 2 puffs into the lungs 2 (two) times daily.   clopidogrel 75 MG tablet Commonly known as: PLAVIX Take 75 mg by mouth daily.   Co Q-10 100 MG Caps Take 100 mg by mouth daily.   diclofenac sodium 1 % Gel Commonly known as: Voltaren Apply 4 g topically 4 (four) times daily. What changed:   when to take this  reasons to  take this   dutasteride 0.5 MG capsule Commonly known as: AVODART TAKE 1 CAPSULE BY MOUTH DAILY   EPINEPHrine 0.3 mg/0.3 mL Soaj injection Commonly known as: EPI-PEN Inject 0.3 mg into the muscle as needed for anaphylaxis.   fesoterodine 8 MG Tb24 tablet Commonly known as: TOVIAZ Take 1 tablet (8 mg total) by mouth daily.   fluticasone 50 MCG/ACT nasal spray Commonly known as: FLONASE Place 2 sprays into both nostrils daily.   fluticasone-salmeterol 230-21 MCG/ACT inhaler Commonly known as: ADVAIR HFA Inhale 2 puffs into the lungs 2 (two) times daily.   furosemide 40 MG tablet Commonly known as: LASIX Take 40 mg by mouth daily.   hydrOXYzine 10 MG tablet Commonly known as: ATARAX/VISTARIL Take 20 mg by mouth at bedtime.   levothyroxine 50 MCG tablet Commonly known as: SYNTHROID Take 50 mcg by mouth daily before breakfast. Take on an empty stomach with a glass of water at least 30-60 minutes before breakfast.   lidocaine 5 % Commonly known as: LIDODERM Place 1 patch onto the skin daily.   Linzess 72 MCG capsule Generic drug: linaclotide Take 72 mcg by mouth daily.   Linzess 145 MCG Caps capsule Generic drug: linaclotide Take 145 mcg by mouth daily before breakfast.   melatonin 3 MG Tabs tablet Take 1 tablet by mouth at  bedtime.   methylPREDNISolone 4 MG tablet Commonly known as: MEDROL Take 4 mg by mouth daily.   oxybutynin 10 MG 24 hr tablet Commonly known as: DITROPAN-XL Take 1 tablet (10 mg total) by mouth daily.   RA Krill Oil 500 MG Caps Take 500 mg by mouth daily.   sulfamethoxazole-trimethoprim 800-160 MG tablet Commonly known as: BACTRIM DS Take 1 tablet by mouth every 12 (twelve) hours. Started by: Zara Council, PA-C   tamsulosin 0.4 MG Caps capsule Commonly known as: FLOMAX TAKE 1 CAPSULE BY MOUTH DAILY   tiotropium 18 MCG inhalation capsule Commonly known as: SPIRIVA Place 1 capsule (18 mcg total) into inhaler and inhale daily.   TOLTERODINE TARTRATE PO Take 4 mg by mouth daily.   traMADol 50 MG tablet Commonly known as: ULTRAM Take 1 tablet (50 mg total) by mouth every 6 (six) hours as needed for moderate pain.   vitamin B-12 1000 MCG tablet Commonly known as: CYANOCOBALAMIN Take 1,000 mcg by mouth daily.   VITAMIN D3 PO Take 1 tablet by mouth daily.       Allergies:  Allergies  Allergen Reactions  . Asa [Aspirin] Shortness Of Breath  . Bee Venom Anaphylaxis  . Nsaids Other (See Comments)    Makes him bleed.  . Tolmetin Other (See Comments)    Makes him bleed.    Family History: Family History  Problem Relation Age of Onset  . Cancer Mother   . Heart attack Father   . Heart disease Other   . Arthritis Other   . Prostate cancer Neg Hx   . Kidney disease Neg Hx     Social History:  reports that he quit smoking about 24 years ago. His smoking use included cigarettes. He has a 60.00 pack-year smoking history. He has never used smokeless tobacco. He reports that he does not drink alcohol or use drugs.  ROS: Pertinent ROS in HPI  Physical Exam: BP 138/79   Pulse 75   Ht 6' (1.829 m)   Wt 250 lb (113.4 kg)   BMI 33.91 kg/m   Constitutional:  Elderly. Alert and oriented, No acute distress. HEENT: [place] AT, mask  in place.  Trachea midline.   Cardiovascular: No clubbing, cyanosis, or edema. Respiratory: Normal respiratory effort, no increased work of breathing. Neurologic: Grossly intact, no focal deficits, moving all 4 extremities. Psychiatric: Normal mood and affect.  Laboratory Data: Lab Results  Component Value Date   WBC 12.0 (H) 07/15/2017   HGB 13.8 07/15/2017   HCT 40.6 07/15/2017   MCV 91.9 07/15/2017   PLT 163 07/15/2017    Lab Results  Component Value Date   CREATININE 0.87 07/13/2017    Lab Results  Component Value Date   TESTOSTERONE 194 (L) 01/16/2016    Lab Results  Component Value Date   HGBA1C 5.7 (H) 05/31/2017    Lab Results  Component Value Date   TSH 6.430 (H) 01/16/2016       Component Value Date/Time   CHOL 187 01/16/2016 0843   CHOL 159 10/30/2014 0540   HDL 26 (L) 01/16/2016 0843   HDL 32 (L) 10/30/2014 0540   CHOLHDL 7.2 (H) 01/16/2016 0843   VLDL 29 10/30/2014 0540   LDLCALC 110 (H) 01/16/2016 0843   LDLCALC 98 10/30/2014 0540    Lab Results  Component Value Date   AST 20 07/13/2017   Lab Results  Component Value Date   ALT 13 (L) 07/13/2017    Urinalysis Component     Latest Ref Rng & Units 02/20/2020  Specific Gravity, UA     1.005 - 1.030 >1.030 (H)  pH, UA     5.0 - 7.5 5.0  Color, UA     Yellow Yellow  Appearance Ur     Clear Cloudy (A)  Leukocytes,UA     Negative Negative  Protein,UA     Negative/Trace Negative  Glucose, UA     Negative Negative  Ketones, UA     Negative Trace (A)  RBC, UA     Negative Negative  Bilirubin, UA     Negative Negative  Urobilinogen, Ur     0.2 - 1.0 mg/dL 0.2  Nitrite, UA     Negative Negative  Microscopic Examination      See below:   Component     Latest Ref Rng & Units 02/20/2020  WBC, UA     0 - 5 /hpf 0-5  RBC     0 - 2 /hpf None seen  Epithelial Cells (non renal)     0 - 10 /hpf 0-10  Casts     None seen /lpf Present (A)  Cast Type     N/A Hyaline casts  Bacteria, UA     None seen/Few  Moderate (A)    I have reviewed the labs.   Pertinent Imaging: Results for ADHARV, SOWINSKI (MRN [id number]) as of 03/13/2020 10:27  Ref. Range 02/20/2020 13:45  Scan Result Unknown 11   Assessment & Plan:    1. BPH with LUTS Continue conservative management, avoiding bladder irritants and timed voiding's Most bothersome symptoms is/are urge incontinence  Continue tamsulosin 0.4 mg daily and dutasteride 0.5 mg daily RTC in 3 weeks for IPSS, PVR and exam   2. Urge incontinence May be worsened by UTI - culture pending RTC in 3 weeks for follow up  3. Dysuria Urine culture is pending - will hold on prescribing antibiotic until culture has returned      Return in about 3 weeks (around 03/12/2020) for IPSS, PVR and exam.  These notes generated with voice recognition software. I apologize for typographical errors.  Zara Council, [facility]  [place]  [place], [place] [address] [phone]

## 2020-02-23 LAB — CULTURE, URINE COMPREHENSIVE

## 2020-02-26 ENCOUNTER — Other Ambulatory Visit: Payer: Self-pay

## 2020-02-26 ENCOUNTER — Telehealth: Payer: Self-pay | Admitting: Family Medicine

## 2020-02-26 DIAGNOSIS — N3946 Mixed incontinence: Secondary | ICD-10-CM

## 2020-02-26 MED ORDER — FESOTERODINE FUMARATE ER 8 MG PO TB24: 8.0000 mg | ORAL_TABLET | Freq: Every day | ORAL | 3 refills | Status: DC

## 2020-02-26 NOTE — Telephone Encounter (Signed)
Lab has been added. 

## 2020-02-26 NOTE — Telephone Encounter (Signed)
-----   Message from Nori Riis, PA-C sent at 02/26/2020  7:45 AM EDT ----- Please call [facility] and ask them to conduct sensitivities on his urine culture.

## 2020-03-04 ENCOUNTER — Telehealth: Payer: Self-pay | Admitting: Family Medicine

## 2020-03-04 LAB — SPECIMEN STATUS REPORT

## 2020-03-04 LAB — SUSCEPTIBILITY, AER + ANAEROB

## 2020-03-04 MED ORDER — CLINDAMYCIN HCL 300 MG PO CAPS: 300.0000 mg | ORAL_CAPSULE | Freq: Two times a day (BID) | ORAL | 0 refills | Status: DC

## 2020-03-04 NOTE — Telephone Encounter (Signed)
-----   Message from Nori Riis, PA-C sent at 03/04/2020  8:13 AM EDT ----- Please let Mr. Brees know that his urine culture was positive and the sensitivities are back.  We need him to start Clindamycin 300 mg, BID x 7 days.

## 2020-03-04 NOTE — Telephone Encounter (Signed)
LMOM notified the sensitivities have come back and he is to take Clindamycin. RX was sent to [facility].

## 2020-03-07 NOTE — Telephone Encounter (Signed)
Patient called. Patient states someone just called him.. I did advise him of previous message and to report to pharmacy to pick up prescription as per shannons message

## 2020-03-12 ENCOUNTER — Ambulatory Visit (INDEPENDENT_AMBULATORY_CARE_PROVIDER_SITE_OTHER): Payer: Medicare PPO | Admitting: Urology

## 2020-03-12 ENCOUNTER — Other Ambulatory Visit: Payer: Self-pay

## 2020-03-12 ENCOUNTER — Encounter: Payer: Self-pay | Admitting: Urology

## 2020-03-12 VITALS — BP 118/69 | HR 84 | Ht 72.0 in | Wt 250.0 lb

## 2020-03-12 DIAGNOSIS — N401 Enlarged prostate with lower urinary tract symptoms: Secondary | ICD-10-CM | POA: Diagnosis not present

## 2020-03-12 DIAGNOSIS — N3946 Mixed incontinence: Secondary | ICD-10-CM

## 2020-03-12 DIAGNOSIS — N138 Other obstructive and reflux uropathy: Secondary | ICD-10-CM

## 2020-03-12 LAB — BLADDER SCAN AMB NON-IMAGING: Scan Result: 115

## 2020-03-18 NOTE — Progress Notes (Signed)
03/12/2020 2:33 PM   Theora Gianotti [birth year] [id number]  Referring provider: [name], MD [place],  [place] [address]  Chief Complaint  Patient presents with  . Urinary Incontinence    HPI: Mr. Bienstock is a 79 year old male with BPH with LU TS, history of urethral strictures and urinary incontinence who presents for follow up with caregiver, [name].    Cystoscopy in 04/2018 with Dr. Matilde Sprang noted he had minimal stricture disease but the scope easily passed.  He had mild bilobar enlargement of the prostate.  He had some lengthening of the prostatic urethra.  He a grade 1 of 4 bladder trabeculation.    Today, he feels the incontinence is better.  He is complaining of frequency, urgency, incontinence, difficulty with urination and a weak stream.  He is taking clindamycin for an UTI, dutasteride 0.5 mg, fesoterodine 8 mg and tamsulosin 0.4 mg daily.  He feels his issues with his back are at play with his urinary issues.  Patient denies any modifying or aggravating factors.  Patient denies any gross hematuria, dysuria or suprapubic/flank pain.  Patient denies any fevers, chills, nausea or vomiting.   IPSS    Row Name 03/12/20 1300         International Prostate Symptom Score   How often have you had the sensation of not emptying your bladder?  Less than 1 in 5     How often have you had to urinate less than every two hours?  Not at All     How often have you found you stopped and started again several times when you urinated?  About half the time     How often have you found it difficult to postpone urination?  More than half the time     How often have you had a weak urinary stream?  Less than 1 in 5 times     How often have you had to strain to start urination?  More than half the time     How many times did you typically get up at night to urinate?  1 Time     Total IPSS Score  14       Quality of Life due to urinary symptoms   If you were to spend the  rest of your life with your urinary condition just the way it is now how would you feel about that?  Mostly Disatisfied        Score:  1-7 Mild 8-19 Moderate 20-35 Severe   PMH: Past Medical History:  Diagnosis Date  . Arthritis   . Asthma   . BPH (benign prostatic hyperplasia)   . 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 Complication of anesthesia    after septoplasty and uvulectomy-was in icu- [facility] - 10 yrs. ago  . COPD (chronic obstructive pulmonary disease) ([place])   . Depression   . Diabetes mellitus without complication ([place])   . Diverticulitis   . ED (erectile dysfunction)   . Full dentures   . GERD (gastroesophageal reflux disease)    no meds  . Headache    difficulty with headaches- 1950's , again in 1967, 2008- again problems with migrraines   . History of GI bleed   . History of multiple pulmonary nodules   . History of shingles   . History of urethral stricture   . HOH (hard of hearing)   . HOH (hard of hearing)   .  Hypogonadism in male   . IBS (irritable bowel syndrome)   . Joint pain   . Medial meniscus, posterior horn derangement    left knee  . Melanosis coli   . Obesity   . Pneumonia 2014   [facility]  . Shortness of breath   . Skin cancer   . Sleep apnea    uses a cpap, most nights   . Stroke [facility]) 1995   some speech and thought processes slow  . Traumatic tear of lateral meniscus of right knee    right knee  . Urinary frequency   . Urinary incontinence     Surgical History: Past Surgical History:  Procedure Laterality Date  . APPENDECTOMY  1963  . BACK SURGERY    . CARPAL TUNNEL RELEASE     left  . CATARACT EXTRACTION W/PHACO Left 09/13/2018   Procedure: CATARACT EXTRACTION PHACO AND INTRAOCULAR LENS PLACEMENT (IOC);  Surgeon: Birder Robson, MD;  Location: [facility] ORS;  Service: Ophthalmology;  Laterality: Left;  [place]  00:42 CDE 6.73 Fluid pack lot #@ [id number]  . CATARACT EXTRACTION W/PHACO Right 11/23/2019   Procedure: CATARACT  EXTRACTION PHACO AND INTRAOCULAR LENS PLACEMENT ([place]) RIGHT;  Surgeon: Birder Robson, MD;  Location: [facility] ORS;  Service: Ophthalmology;  Laterality: Right;  [place] 01:10.1 CDE 10.25 Fluid Pack Lot # [id number]  . COLONOSCOPY    . COLONOSCOPY WITH PROPOFOL N/A 12/07/2016   Procedure: COLONOSCOPY WITH PROPOFOL;  Surgeon: Lollie Sails, MD;  Location: [facility] ENDOSCOPY;  Service: Endoscopy;  Laterality: N/A;  . COLONOSCOPY WITH PROPOFOL N/A 06/26/2019   Procedure: COLONOSCOPY WITH PROPOFOL;  Surgeon: Lollie Sails, MD;  Location: [facility] ENDOSCOPY;  Service: Endoscopy;  Laterality: N/A;  . EYE SURGERY     foreign body removed fr. eye, ? side   . FOOT FOREIGN BODY REMOVAL  1976   left foot -multiple pieces glass  . HEMORROIDECTOMY  1995  . KNEE ARTHROSCOPY Bilateral 01/17/2013   Procedure: ARTHROSCOPY KNEE BILATERAL WITH MEDIAL AND LATERAL MENISECTOMIES;  Surgeon: Lorn Junes, MD;  Location: [place];  Service: Orthopedics;  Laterality: Bilateral;  . KYPHOPLASTY N/A 07/23/2017   Procedure: KYPHOPLASTY- L1;  Surgeon: Hessie Knows, MD;  Location: [facility] ORS;  Service: Orthopedics;  Laterality: N/A;  . LUMBAR LAMINECTOMY N/A 11/05/2014   Procedure: LEFT L3-4 MICRODISCECTOMY;  Surgeon: Jessy Oto, MD;  Location: [place];  Service: Orthopedics;  Laterality: N/A;  . LUMBAR LAMINECTOMY/DECOMPRESSION MICRODISCECTOMY N/A 02/12/2015   Procedure: RE-DO LEFT L3-4 MICRODISCECTOMY;  Surgeon: Jessy Oto, MD;  Location: [place];  Service: Orthopedics;  Laterality: N/A;  . NASAL SEPTUM SURGERY  1995  . TONSILLECTOMY    . UPPER GI ENDOSCOPY    . UVULOPALATOPHARYNGOPLASTY (UPPP)/TONSILLECTOMY/SEPTOPLASTY  1995   same time with septoplasty-ended up ICU almost trached    Home Medications:  Allergies as of 03/12/2020      Reactions   Asa [aspirin] Shortness Of Breath   Bee Venom Anaphylaxis   Nsaids Other (See Comments)   Makes him bleed.   Tolmetin Other (See Comments)   Makes him bleed.        Medication List       Accurate as of March 12, 2020 11:59 PM. If you have any questions, ask your nurse or doctor.        STOP taking these medications   oxybutynin 10 MG 24 hr tablet Commonly known as: DITROPAN-XL Stopped by: Zara Council, PA-C     TAKE these medications  acetaminophen 500 MG tablet Commonly known as: TYLENOL Take 1,000 mg by mouth every 6 (six) hours as needed for moderate pain.   albuterol 108 (90 Base) MCG/ACT inhaler Commonly known as: VENTOLIN HFA Inhale 1 puff into the lungs every 6 (six) hours as needed for wheezing or shortness of breath.   budesonide-formoterol 160-4.5 MCG/ACT inhaler Commonly known as: SYMBICORT Inhale 2 puffs into the lungs 2 (two) times daily.   budesonide-formoterol 160-4.5 MCG/ACT inhaler Commonly known as: SYMBICORT Inhale 2 puffs into the lungs 2 (two) times daily.   clindamycin 300 MG capsule Commonly known as: CLEOCIN Take 1 capsule (300 mg total) by mouth in the morning and at bedtime.   clopidogrel 75 MG tablet Commonly known as: PLAVIX Take 75 mg by mouth daily.   Co Q-10 100 MG Caps Take 100 mg by mouth daily.   diclofenac sodium 1 % Gel Commonly known as: Voltaren Apply 4 g topically 4 (four) times daily. What changed:   when to take this  reasons to take this   dutasteride 0.5 MG capsule Commonly known as: AVODART TAKE 1 CAPSULE BY MOUTH DAILY   EPINEPHrine 0.3 mg/0.3 mL Soaj injection Commonly known as: EPI-PEN Inject 0.3 mg into the muscle as needed for anaphylaxis.   fesoterodine 8 MG Tb24 tablet Commonly known as: TOVIAZ Take 1 tablet (8 mg total) by mouth daily.   fluticasone 50 MCG/ACT nasal spray Commonly known as: FLONASE Place 2 sprays into both nostrils daily.   fluticasone-salmeterol 230-21 MCG/ACT inhaler Commonly known as: ADVAIR HFA Inhale 2 puffs into the lungs 2 (two) times daily.   furosemide 40 MG tablet Commonly known as: LASIX Take 40 mg by mouth daily.    hydrOXYzine 10 MG tablet Commonly known as: ATARAX/VISTARIL Take 20 mg by mouth at bedtime.   levothyroxine 50 MCG tablet Commonly known as: SYNTHROID Take 50 mcg by mouth daily before breakfast. Take on an empty stomach with a glass of water at least 30-60 minutes before breakfast.   lidocaine 5 % Commonly known as: LIDODERM Place 1 patch onto the skin daily.   Linzess 72 MCG capsule Generic drug: linaclotide Take 72 mcg by mouth daily. What changed: Another medication with the same name was removed. Continue taking this medication, and follow the directions you see here. Changed by: Carline Dura, PA-C   melatonin 3 MG Tabs tablet Take 1 tablet by mouth at bedtime.   methylPREDNISolone 4 MG tablet Commonly known as: MEDROL Take 4 mg by mouth daily.   RA Krill Oil 500 MG Caps Take 500 mg by mouth daily.   sulfamethoxazole-trimethoprim 800-160 MG tablet Commonly known as: BACTRIM DS Take 1 tablet by mouth every 12 (twelve) hours.   tamsulosin 0.4 MG Caps capsule Commonly known as: FLOMAX TAKE 1 CAPSULE BY MOUTH DAILY   tiotropium 18 MCG inhalation capsule Commonly known as: SPIRIVA Place 1 capsule (18 mcg total) into inhaler and inhale daily.   TOLTERODINE TARTRATE PO Take 4 mg by mouth daily.   traMADol 50 MG tablet Commonly known as: ULTRAM Take 1 tablet (50 mg total) by mouth every 6 (six) hours as needed for moderate pain.   vitamin B-12 1000 MCG tablet Commonly known as: CYANOCOBALAMIN Take 1,000 mcg by mouth daily.   VITAMIN D3 PO Take 1 tablet by mouth daily.       Allergies:  Allergies  Allergen Reactions  . Asa [Aspirin] Shortness Of Breath  . Bee Venom Anaphylaxis  . Nsaids Other (See Comments)  Makes him bleed.  . Tolmetin Other (See Comments)    Makes him bleed.    Family History: Family History  Problem Relation Age of Onset  . Cancer Mother   . Heart attack Father   . Heart disease Other   . Arthritis Other   . Prostate  cancer Neg Hx   . Kidney disease Neg Hx     Social History:  reports that he quit smoking about 24 years ago. His smoking use included cigarettes. He has a 60.00 pack-year smoking history. He has never used smokeless tobacco. He reports that he does not drink alcohol or use drugs.  ROS: Pertinent ROS in HPI  Physical Exam: BP 118/69   Pulse 84   Ht 6' (1.829 m)   Wt 250 lb (113.4 kg)   BMI 33.91 kg/m   Constitutional:  Well nourished. Elderly appearing. Alert and oriented, No acute distress. HEENT: [place] AT, mask in place.  Trachea midline.  Cardiovascular: No clubbing, cyanosis, or edema. Respiratory: Normal respiratory effort, no increased work of breathing. Neurologic: Grossly intact, no focal deficits, moving all 4 extremities.  In wheelchair.   Psychiatric: Normal mood and affect.  Laboratory Data: Lab Results  Component Value Date   WBC 12.0 (H) 07/15/2017   HGB 13.8 07/15/2017   HCT 40.6 07/15/2017   MCV 91.9 07/15/2017   PLT 163 07/15/2017    Lab Results  Component Value Date   CREATININE 0.87 07/13/2017    Lab Results  Component Value Date   TESTOSTERONE 194 (L) 01/16/2016    Lab Results  Component Value Date   HGBA1C 5.7 (H) 05/31/2017    Lab Results  Component Value Date   TSH 6.430 (H) 01/16/2016       Component Value Date/Time   CHOL 187 01/16/2016 0843   CHOL 159 10/30/2014 0540   HDL 26 (L) 01/16/2016 0843   HDL 32 (L) 10/30/2014 0540   CHOLHDL 7.2 (H) 01/16/2016 0843   VLDL 29 10/30/2014 0540   LDLCALC 110 (H) 01/16/2016 0843   LDLCALC 98 10/30/2014 0540    Lab Results  Component Value Date   AST 20 07/13/2017   Lab Results  Component Value Date   ALT 13 (L) 07/13/2017    Urinalysis Component     Latest Ref Rng & Units 02/20/2020  Specific Gravity, UA     1.005 - 1.030 >1.030 (H)  pH, UA     5.0 - 7.5 5.0  Color, UA     Yellow Yellow  Appearance Ur     Clear Cloudy (A)  Leukocytes,UA     Negative Negative  Protein,UA      Negative/Trace Negative  Glucose, UA     Negative Negative  Ketones, UA     Negative Trace (A)  RBC, UA     Negative Negative  Bilirubin, UA     Negative Negative  Urobilinogen, Ur     0.2 - 1.0 mg/dL 0.2  Nitrite, UA     Negative Negative  Microscopic Examination      See below:   Component     Latest Ref Rng & Units 02/20/2020  WBC, UA     0 - 5 /hpf 0-5  RBC     0 - 2 /hpf None seen  Epithelial Cells (non renal)     0 - 10 /hpf 0-10  Casts     None seen /lpf Present (A)  Cast Type     N/A Hyaline casts  Bacteria, UA     None seen/Few Moderate (A)    I have reviewed the labs.   Pertinent Imaging: Results for LUNSFORD, BENNER (MRN [id number]) as of 03/18/2020 14:30  Ref. Range 03/12/2020 13:15  Scan Result Unknown 115    Assessment & Plan:    1. BPH with LU TS I PSS 14/4 Continue conservative management, avoiding bladder irritants and timed voiding's Most bothersome symptoms is/are urge incontinence  Continue tamsulosin 0.4 mg daily and dutasteride 0.5 mg daily RTC prn   2. Urge incontinence Complete antibiotic Continue Toviaz 8 mg daily  Patient satisfied with his urinary status at this time, he would like to return on a PRN basis  3. UTI Complete antibiotic RTC if not improved  Return if symptoms worsen or fail to improve.  These notes generated with voice recognition software. I apologize for typographical errors.  Zara Council, PA-C  [facility] [address]  [place], [place] [address] [phone]

## 2020-03-20 ENCOUNTER — Other Ambulatory Visit: Payer: Self-pay | Admitting: Urology

## 2020-03-22 ENCOUNTER — Other Ambulatory Visit: Payer: Self-pay | Admitting: Urology

## 2020-03-23 ENCOUNTER — Other Ambulatory Visit: Payer: Self-pay | Admitting: Urology

## 2020-03-26 DIAGNOSIS — M47816 Spondylosis without myelopathy or radiculopathy, lumbar region: Secondary | ICD-10-CM | POA: Insufficient documentation

## 2020-04-01 ENCOUNTER — Other Ambulatory Visit: Payer: Self-pay

## 2020-04-01 ENCOUNTER — Ambulatory Visit: Payer: Medicare PPO | Admitting: Dermatology

## 2020-04-01 DIAGNOSIS — L578 Other skin changes due to chronic exposure to nonionizing radiation: Secondary | ICD-10-CM

## 2020-04-01 DIAGNOSIS — L820 Inflamed seborrheic keratosis: Secondary | ICD-10-CM | POA: Diagnosis not present

## 2020-04-01 DIAGNOSIS — L821 Other seborrheic keratosis: Secondary | ICD-10-CM

## 2020-04-01 DIAGNOSIS — Z85828 Personal history of other malignant neoplasm of skin: Secondary | ICD-10-CM | POA: Diagnosis not present

## 2020-04-01 DIAGNOSIS — L219 Seborrheic dermatitis, unspecified: Secondary | ICD-10-CM | POA: Diagnosis not present

## 2020-04-01 DIAGNOSIS — L570 Actinic keratosis: Secondary | ICD-10-CM | POA: Diagnosis not present

## 2020-04-01 DIAGNOSIS — D692 Other nonthrombocytopenic purpura: Secondary | ICD-10-CM

## 2020-04-01 DIAGNOSIS — L304 Erythema intertrigo: Secondary | ICD-10-CM

## 2020-04-01 DIAGNOSIS — Z1283 Encounter for screening for malignant neoplasm of skin: Secondary | ICD-10-CM | POA: Diagnosis not present

## 2020-04-01 NOTE — Patient Instructions (Addendum)
Cryotherapy Aftercare  . Wash gently with soap and water everyday.   . Apply Vaseline and Band-Aid daily until healed.  Instructions for [facility] Medications  One or more of your medications was sent to the [facility] mail order compounding pharmacy. You will receive an email from them and can purchase the medicine through that link. It will then be mailed to your home at the address you confirmed. If for any reason you do not receive an email from them, please check your spam folder. If you still do not find the email, please let us know.    

## 2020-04-01 NOTE — Progress Notes (Signed)
Follow-Up Visit   Subjective  Eddie Carter is a 79 y.o. male who presents for the following: UBSE (had a spot on his left ankle that looked like a mole but now is gone. ), Hx of SCC (L. Dorsum hand), HX BCC (Nose), Intertrigo (was using alcortin A cream, but has since ran out.), and HX if ISK (B/L arms x18 and R. post shoulder x1.). Patient presents for upper body skin exam for skin cancer screening and mole check.  Skin cancer screening performed today.   The following portions of the chart were reviewed this encounter and updated as appropriate:  Tobacco  Allergies  Meds  Problems  Med Hx  Surg Hx  Fam Hx      Review of Systems:  No other skin or systemic complaints except as noted in HPI or Assessment and Plan.  Objective  Well appearing patient in no apparent distress; mood and affect are within normal limits.  A focused examination was performed including face, neck, chest and back and B/L lower legs. Relevant physical exam findings are noted in the Assessment and Plan.  Objective  Right Ear (2): Erythematous thin papules/macules with gritty scale.   Objective  Groin: Xerotic hyperpigmentation.  Objective  Back, Face and Left Ankle x 6 (6): Erythematous keratotic or waxy stuck-on papule or plaque.   Objective  Mid Frontal Scalp: Pink patches with greasy scale.    Assessment & Plan  AK (actinic keratosis) (2) Right Ear  Destruction of lesion - Right Ear Complexity: simple   Destruction method: cryotherapy   Informed consent: discussed and consent obtained   Timeout:  patient name, date of birth, surgical site, and procedure verified Lesion destroyed using liquid nitrogen: Yes   Region frozen until ice ball extended beyond lesion: Yes   Outcome: patient tolerated procedure well with no complications   Post-procedure details: wound care instructions given    Erythema intertrigo Groin  Start [facility] Intertrigo (Iodoquinol 1%, Hydrocortisone  2.5%, and Niacinamide)cream Twice daily to affected ares    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  Inflamed seborrheic keratosis (6) Back, Face and Left Ankle x 6  Destruction of lesion - Back, Face and Left Ankle x 6 Complexity: simple   Destruction method: cryotherapy   Informed consent: discussed and consent obtained   Timeout:  patient name, date of birth, surgical site, and procedure verified Lesion destroyed using liquid nitrogen: Yes   Region frozen until ice ball extended beyond lesion: Yes   Outcome: patient tolerated procedure well with no complications   Post-procedure details: wound care instructions given    Seborrheic dermatitis Mid Frontal Scalp  Ketoconazole shampoo Apply to scalp twice weekly leave in for 5 to 10 mins. Patient can alternate with a Salicylic acid based shampoo as well.  ketoconazole (NIZORAL) 2 % shampoo - Mid Frontal Scalp  Skin cancer screening  Actinic Damage - diffuse scaly erythematous macules with underlying dyspigmentation - Recommend daily broad spectrum sunscreen SPF 30+ to sun-exposed areas, reapply every 2 hours as needed.  - Call for new or changing lesions.  Seborrheic Keratoses - Stuck-on, waxy, tan-brown papules and plaques  - Discussed benign etiology and prognosis. - Observe - Call for any changes  Purpura - Violaceous macules and patches - Benign - Related to age, sun damage and/or use of blood thinners - Observe - Can use OTC arnica containing moisturizer such as Dermend Bruise Formula if desired - Call for worsening or other  concerns   Return in about 1 year (around 04/01/2021).   Marene Lenz, CMA, am acting as scribe for Sarina Ser, MD  Documentation: I have reviewed the above documentation for accuracy and completeness, and I agree with the above.  Sarina Ser, MD

## 2020-04-02 ENCOUNTER — Encounter: Payer: Self-pay | Admitting: Dermatology

## 2020-04-03 MED ORDER — KETOCONAZOLE 2 % EX SHAM: 1.0000 "application " | MEDICATED_SHAMPOO | CUTANEOUS | 11 refills | Status: DC

## 2020-04-29 ENCOUNTER — Telehealth: Payer: Self-pay

## 2020-04-29 NOTE — Telephone Encounter (Signed)
Patient called because he has not gotten rx through [facility] filled. I called patient and gave him their phone number. I also emailed [email or web address] and asked them to reach back out to patient since he does not have email.

## 2020-04-29 NOTE — Telephone Encounter (Signed)
We received fax from [facility] that patient was looking for a alternative prescription to [facility]. Medication was sent to [facility] at last appointment. Left message for patient to return the call to the office to contact them at [phone] to receive prescription.

## 2020-05-08 ENCOUNTER — Telehealth: Payer: Self-pay

## 2020-05-08 NOTE — Telephone Encounter (Signed)
Patient returned call, stating he wanted to speak with [facility] in regards to swelling in his feet. Patient was informed that he should contact his primary care for further evaluation, patient got upset and hung up

## 2020-05-08 NOTE — Telephone Encounter (Signed)
Patient left a voicemail asking for a return call but did not specify as to why. Attempted to return call no answer and mail box was full

## 2020-05-09 ENCOUNTER — Ambulatory Visit (INDEPENDENT_AMBULATORY_CARE_PROVIDER_SITE_OTHER): Payer: Medicare PPO | Admitting: Internal Medicine

## 2020-05-09 ENCOUNTER — Encounter: Payer: Self-pay | Admitting: Internal Medicine

## 2020-05-09 ENCOUNTER — Other Ambulatory Visit: Payer: Self-pay

## 2020-05-09 VITALS — BP 132/70 | HR 85

## 2020-05-09 DIAGNOSIS — R600 Localized edema: Secondary | ICD-10-CM

## 2020-05-09 DIAGNOSIS — G8929 Other chronic pain: Secondary | ICD-10-CM

## 2020-05-09 DIAGNOSIS — K219 Gastro-esophageal reflux disease without esophagitis: Secondary | ICD-10-CM | POA: Diagnosis not present

## 2020-05-09 DIAGNOSIS — M5416 Radiculopathy, lumbar region: Secondary | ICD-10-CM

## 2020-05-09 DIAGNOSIS — R0602 Shortness of breath: Secondary | ICD-10-CM

## 2020-05-09 DIAGNOSIS — Z7901 Long term (current) use of anticoagulants: Secondary | ICD-10-CM

## 2020-05-09 DIAGNOSIS — J449 Chronic obstructive pulmonary disease, unspecified: Secondary | ICD-10-CM | POA: Diagnosis not present

## 2020-05-09 DIAGNOSIS — E669 Obesity, unspecified: Secondary | ICD-10-CM

## 2020-05-09 NOTE — Assessment & Plan Note (Signed)
Patient was advised to make every effort to keep losing weight on

## 2020-05-09 NOTE — Assessment & Plan Note (Signed)
GERD is stable at the present time.

## 2020-05-09 NOTE — Assessment & Plan Note (Signed)
Due to obesity and COPD patient has wheelchair-bound.

## 2020-05-09 NOTE — Assessment & Plan Note (Signed)
We will get ultrasound of the both legs in the hospital meantime increase the Lasix to 40 mg po twice a day for 1 week

## 2020-05-09 NOTE — Progress Notes (Signed)
Established Patient Office Visit  Subjective:  Patient ID: Eddie Carter, male    DOB: 06-01-1941  Age: 79 y.o. MRN: [id number]  CC:  Chief Complaint  Patient presents with  . Edema    Pt complains of both feet have been swollen for 2 weeks    HPI  Eddie Carter presents for swelling of the both legs right more than the left.  He also complains of swelling of the right foot.  Denies any chest pain or shortness of breath.  Patient is wheelchair-bound and it hurts him to move from one side to the other.  Past Medical History:  Diagnosis Date  . Arthritis   . Asthma   . BPH (benign prostatic hyperplasia)   . 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 Complication of anesthesia    after septoplasty and uvulectomy-was in icu- [facility] - 10 yrs. ago  . COPD (chronic obstructive pulmonary disease) ([place])   . Depression   . Diabetes mellitus without complication ([place])   . Diverticulitis   . ED (erectile dysfunction)   . Full dentures   . GERD (gastroesophageal reflux disease)    no meds  . Headache    difficulty with headaches- 1950's , again in 1967, 2008- again problems with migrraines   . History of GI bleed   . History of multiple pulmonary nodules   . History of shingles   . History of urethral stricture   . HOH (hard of hearing)   . HOH (hard of hearing)   . Hypogonadism in male   . IBS (irritable bowel syndrome)   . Joint pain   . Medial meniscus, posterior horn derangement    left knee  . Melanosis coli   . Obesity   . Pneumonia 2014   [facility]  . Shortness of breath   . Skin cancer   . Sleep apnea    uses a cpap, most nights   . Stroke [facility]) 1995   some speech and thought processes slow  . Traumatic tear of lateral meniscus of right knee    right knee  . Urinary frequency   . Urinary incontinence     Past Surgical History:  Procedure Laterality Date  . APPENDECTOMY  1963  . BACK SURGERY    . CARPAL TUNNEL RELEASE     left  . CATARACT EXTRACTION  W/PHACO Left 09/13/2018   Procedure: CATARACT EXTRACTION PHACO AND INTRAOCULAR LENS PLACEMENT (IOC);  Surgeon: Birder Robson, MD;  Location: [facility] ORS;  Service: Ophthalmology;  Laterality: Left;  [place]  00:42 CDE 6.73 Fluid pack lot #[id number]  . CATARACT EXTRACTION W/PHACO Right 11/23/2019   Procedure: CATARACT EXTRACTION PHACO AND INTRAOCULAR LENS PLACEMENT ([place]) RIGHT;  Surgeon: Birder Robson, MD;  Location: [facility] ORS;  Service: Ophthalmology;  Laterality: Right;  [place] 01:10.1 CDE 10.25 Fluid Pack Lot # [id number]  . COLONOSCOPY    . COLONOSCOPY WITH PROPOFOL N/A 12/07/2016   Procedure: COLONOSCOPY WITH PROPOFOL;  Surgeon: Lollie Sails, MD;  Location: [facility] ENDOSCOPY;  Service: Endoscopy;  Laterality: N/A;  . COLONOSCOPY WITH PROPOFOL N/A 06/26/2019   Procedure: COLONOSCOPY WITH PROPOFOL;  Surgeon: Lollie Sails, MD;  Location: [facility] ENDOSCOPY;  Service: Endoscopy;  Laterality: N/A;  . EYE SURGERY     foreign body removed fr. eye, ? side   . FOOT FOREIGN BODY REMOVAL  1976   left foot -multiple pieces glass  . HEMORROIDECTOMY  1995  . KNEE ARTHROSCOPY Bilateral 01/17/2013  Procedure: ARTHROSCOPY KNEE BILATERAL WITH MEDIAL AND LATERAL MENISECTOMIES;  Surgeon: Lorn Junes, MD;  Location: [place];  Service: Orthopedics;  Laterality: Bilateral;  . KYPHOPLASTY N/A 07/23/2017   Procedure: KYPHOPLASTY- L1;  Surgeon: Hessie Knows, MD;  Location: [facility] ORS;  Service: Orthopedics;  Laterality: N/A;  . LUMBAR LAMINECTOMY N/A 11/05/2014   Procedure: LEFT L3-4 MICRODISCECTOMY;  Surgeon: Jessy Oto, MD;  Location: [place];  Service: Orthopedics;  Laterality: N/A;  . LUMBAR LAMINECTOMY/DECOMPRESSION MICRODISCECTOMY N/A 02/12/2015   Procedure: RE-DO LEFT L3-4 MICRODISCECTOMY;  Surgeon: Jessy Oto, MD;  Location: [place];  Service: Orthopedics;  Laterality: N/A;  . NASAL SEPTUM SURGERY  1995  . TONSILLECTOMY    . UPPER GI ENDOSCOPY    . UVULOPALATOPHARYNGOPLASTY  (UPPP)/TONSILLECTOMY/SEPTOPLASTY  1995   same time with septoplasty-ended up ICU almost trached    Family History  Problem Relation Age of Onset  . Cancer Mother   . Heart attack Father   . Heart disease Other   . Arthritis Other   . Prostate cancer Neg Hx   . Kidney disease Neg Hx     Social History   Socioeconomic History  . Marital status: Married    Spouse name: Mardene Celeste  . Number of children: 2  . Years of education: 75  . Highest education level: Not on file  Occupational History    Comment: register of deeds  Tobacco Use  . Smoking status: Former Smoker    Packs/day: 1.50    Years: 40.00    Pack years: 60.00    Types: Cigarettes    Quit date: 01/14/1996    Years since quitting: 24.3  . Smokeless tobacco: Never Used  Vaping Use  . Vaping Use: Never used  Substance and Sexual Activity  . Alcohol use: No    Comment: wine in a month  . Drug use: No  . Sexual activity: Never  Other Topics Concern  . Not on file  Social History Narrative   Lives at home with wife, son   caffeine use - 2 cups coffee daily, sodas 1 a day, occas tea   Social Determinants of Health   Financial Resource Strain:   . Difficulty of Paying Living Expenses:   Food Insecurity:   . Worried About Charity fundraiser in the Last Year:   . Arboriculturist in the Last Year:   Transportation Needs:   . Film/video editor (Medical):   Marland Kitchen Lack of Transportation (Non-Medical):   Physical Activity:   . Days of Exercise per Week:   . Minutes of Exercise per Session:   Stress:   . Feeling of Stress :   Social Connections:   . Frequency of Communication with Friends and Family:   . Frequency of Social Gatherings with Friends and Family:   . Attends Religious Services:   . Active Member of Clubs or Organizations:   . Attends Archivist Meetings:   Marland Kitchen Marital Status:   Intimate Partner Violence:   . Fear of Current or Ex-Partner:   . Emotionally Abused:   Marland Kitchen Physically Abused:    . Sexually Abused:      Current Outpatient Medications:  .  acetaminophen (TYLENOL) 500 MG tablet, Take 1,000 mg by mouth every 6 (six) hours as needed for moderate pain. , Disp: , Rfl:  .  albuterol (VENTOLIN HFA) 108 (90 Base) MCG/ACT inhaler, Inhale 1 puff into the lungs every 6 (six) hours as needed for  wheezing or shortness of breath., Disp: , Rfl:  .  budesonide-formoterol (SYMBICORT) 160-4.5 MCG/ACT inhaler, Inhale 2 puffs into the lungs 2 (two) times daily., Disp: 3 Inhaler, Rfl: 1 .  Cholecalciferol (VITAMIN D3 PO), Take 1 tablet by mouth daily., Disp: , Rfl:  .  clopidogrel (PLAVIX) 75 MG tablet, Take 75 mg by mouth daily. , Disp: , Rfl:  .  Coenzyme Q10 (CO Q-10) 100 MG CAPS, Take 100 mg by mouth daily. , Disp: , Rfl:  .  dutasteride (AVODART) 0.5 MG capsule, TAKE 1 CAPSULE BY MOUTH EVERY DAY, Disp: 90 capsule, Rfl: 3 .  EPINEPHrine 0.3 mg/0.3 mL IJ SOAJ injection, Inject 0.3 mg into the muscle as needed for anaphylaxis. , Disp: , Rfl:  .  fesoterodine (TOVIAZ) 8 MG TB24 tablet, Take 1 tablet (8 mg total) by mouth daily., Disp: 90 tablet, Rfl: 3 .  fluticasone (FLONASE) 50 MCG/ACT nasal spray, Place 2 sprays into both nostrils daily., Disp: 16 g, Rfl: 5 .  fluticasone-salmeterol (ADVAIR HFA) 230-21 MCG/ACT inhaler, Inhale 2 puffs into the lungs 2 (two) times daily., Disp: , Rfl:  .  furosemide (LASIX) 40 MG tablet, Take 40 mg by mouth daily. , Disp: , Rfl:  .  hydrOXYzine (ATARAX/VISTARIL) 10 MG tablet, Take 20 mg by mouth at bedtime. , Disp: , Rfl:  .  ketoconazole (NIZORAL) 2 % shampoo, Apply 1 application topically 2 (two) times a week. Apply to scalp twice weekly leave in for 5 to 10 mins., Disp: 120 mL, Rfl: 11 .  levothyroxine (SYNTHROID, LEVOTHROID) 50 MCG tablet, Take 50 mcg by mouth daily before breakfast. Take on an empty stomach with a glass of water at least 30-60 minutes before breakfast. , Disp: , Rfl:  .  lidocaine (LIDODERM) 5 %, Place 1 patch onto the skin daily. ,  Disp: , Rfl:  .  Melatonin 3 MG TABS, Take 1 tablet by mouth at bedtime., Disp: , Rfl:  .  RA KRILL OIL 500 MG CAPS, Take 500 mg by mouth daily. , Disp: , Rfl:  .  tamsulosin (FLOMAX) 0.4 MG CAPS capsule, TAKE 1 CAPSULE BY MOUTH EVERY DAY, Disp: 90 capsule, Rfl: 3 .  tiotropium (SPIRIVA) 18 MCG inhalation capsule, Place 1 capsule (18 mcg total) into inhaler and inhale daily., Disp: 90 capsule, Rfl: 1 .  TOLTERODINE TARTRATE PO, Take 4 mg by mouth daily., Disp: , Rfl:  .  vitamin B-12 (CYANOCOBALAMIN) 1000 MCG tablet, Take 1,000 mcg by mouth daily., Disp: , Rfl:    Allergies  Allergen Reactions  . Asa [Aspirin] Shortness Of Breath  . Bee Venom Anaphylaxis  . Nsaids Other (See Comments)    Makes him bleed.  . Tolmetin Other (See Comments)    Makes him bleed.    ROS Review of Systems  Constitutional: Positive for fatigue.  HENT: Negative for facial swelling, sneezing and sore throat.   Eyes: Negative for itching.  Respiratory: Positive for shortness of breath. Negative for cough and wheezing.   Cardiovascular: Negative for chest pain.  Genitourinary: Positive for dysuria.  Musculoskeletal: Positive for arthralgias, back pain, gait problem and joint swelling.  Neurological: Negative for dizziness, syncope and headaches.  Psychiatric/Behavioral: Negative for behavioral problems. The patient is not nervous/anxious.       Objective:    Physical Exam Constitutional:      Appearance: He is obese.  HENT:     Head: Normocephalic.  Eyes:     Pupils: Pupils are equal, round, and reactive to  light.  Cardiovascular:     Rate and Rhythm: Normal rate.     Heart sounds: No murmur heard.   Pulmonary:     Effort: Respiratory distress present.     Breath sounds: No rhonchi or rales.  Abdominal:     General: There is distension.  Musculoskeletal:        General: Swelling present.     Cervical back: No rigidity.     Comments: Swelling of the both legs right.  Right is more than the  left.  He also has 2+ edema of the right foot.  Evidence of dermatitis of the both legs due to chronic swelling of the right knee injection of the 3 to 4 weeks ago.  Neurological:     General: No focal deficit present.     Mental Status: He is alert and oriented to person, place, and time.  Psychiatric:        Behavior: Behavior normal.     BP 132/70   Pulse 85  Wt Readings from Last 3 Encounters:  03/12/20 250 lb (113.4 kg)  02/20/20 250 lb (113.4 kg)  11/10/19 250 lb (113.4 kg)     Health Maintenance Due  Topic Date Due  . Hepatitis C Screening  Never done  . OPHTHALMOLOGY EXAM  Never done  . URINE MICROALBUMIN  Never done  . COVID-19 Vaccine (1) Never done  . TETANUS/TDAP  Never done  . PNA vac Low Risk Adult (1 of 2 - PCV13) Never done  . HEMOGLOBIN A1C  12/01/2017    There are no preventive care reminders to display for this patient.  Lab Results  Component Value Date   TSH 6.430 (H) 01/16/2016   Lab Results  Component Value Date   WBC 12.0 (H) 07/15/2017   HGB 13.8 07/15/2017   HCT 40.6 07/15/2017   MCV 91.9 07/15/2017   PLT 163 07/15/2017   Lab Results  Component Value Date   NA 140 07/13/2017   K 4.0 07/13/2017   CO2 26 07/13/2017   GLUCOSE 124 (H) 07/13/2017   BUN 11 07/13/2017   CREATININE 0.87 07/13/2017   BILITOT 1.0 07/13/2017   ALKPHOS 54 07/13/2017   AST 20 07/13/2017   ALT 13 (L) 07/13/2017   PROT 6.6 07/13/2017   ALBUMIN 3.4 (L) 07/13/2017   CALCIUM 8.9 07/13/2017   ANIONGAP 7 07/13/2017   Lab Results  Component Value Date   CHOL 187 01/16/2016   Lab Results  Component Value Date   HDL 26 (L) 01/16/2016   Lab Results  Component Value Date   LDLCALC 110 (H) 01/16/2016   Lab Results  Component Value Date   TRIG 253 (H) 01/16/2016   Lab Results  Component Value Date   CHOLHDL 7.2 (H) 01/16/2016   Lab Results  Component Value Date   HGBA1C 5.7 (H) 05/31/2017      Assessment & Plan:   Problem List Items Addressed This  Visit      Respiratory   COPD (chronic obstructive pulmonary disease) ([place])    Continue taking present medication        Digestive   GERD (gastroesophageal reflux disease)    GERD is stable at the present time.        Other   Chronic lumbar radicular pain (Location of Secondary source of pain) (Bilateral) (L4 Dermatome) (L>R) (Chronic)    It is a chronic problem patient is being followed up by the pain specialist.  Continued follow-up and treatment  Chronic anticoagulation (Plavix) (Chronic)   Shortness of breath    Due to obesity and COPD patient is wheelchair-bound.      Obesity (BMI 35.0-39.9 without comorbidity)    Patient was advised to make every effort to keep losing weight       Edema of both lower legs - Primary    We will get ultrasound of the both legs in the hospital meantime, increase the Lasix to 40 mg po twice a day for 1 week      Relevant Orders   DOPPLER VENOUS LEGS BILATERAL      No orders of the defined types were placed in this encounter.  1. Edema of both lower legs  - DOPPLER VENOUS LEGS BILATERAL; Future  2. Shortness of breath   3. Gastroesophageal reflux disease without esophagitis   4. Chronic obstructive pulmonary disease, unspecified COPD type ([place])   5. Obesity (BMI 35.0-39.9 without comorbidity)   6. Chronic lumbar radicular pain (Location of Secondary source of pain) (Bilateral) (L4 Dermatome) (L>R)   7. Chronic anticoagulation (Plavix)  Follow-up: Return in about 1 week (around 05/16/2020).    [name], MD

## 2020-05-09 NOTE — Assessment & Plan Note (Signed)
It is a chronic problem patient is being referred to the pain specialist.  Continued follow-up and treatment

## 2020-05-09 NOTE — Assessment & Plan Note (Signed)
Continue taking present medication

## 2020-05-15 DIAGNOSIS — M461 Sacroiliitis, not elsewhere classified: Secondary | ICD-10-CM | POA: Insufficient documentation

## 2020-05-29 ENCOUNTER — Other Ambulatory Visit: Payer: Self-pay | Admitting: Internal Medicine

## 2020-06-04 ENCOUNTER — Ambulatory Visit: Payer: Medicare PPO | Admitting: Internal Medicine

## 2020-06-05 ENCOUNTER — Encounter: Payer: Self-pay | Admitting: Internal Medicine

## 2020-06-05 ENCOUNTER — Other Ambulatory Visit: Payer: Self-pay

## 2020-06-05 ENCOUNTER — Ambulatory Visit (INDEPENDENT_AMBULATORY_CARE_PROVIDER_SITE_OTHER): Payer: Medicare PPO | Admitting: Internal Medicine

## 2020-06-05 VITALS — BP 135/58 | HR 83

## 2020-06-05 DIAGNOSIS — M79606 Pain in leg, unspecified: Secondary | ICD-10-CM

## 2020-06-05 DIAGNOSIS — M5416 Radiculopathy, lumbar region: Secondary | ICD-10-CM | POA: Diagnosis not present

## 2020-06-05 DIAGNOSIS — R600 Localized edema: Secondary | ICD-10-CM | POA: Diagnosis not present

## 2020-06-05 DIAGNOSIS — G8929 Other chronic pain: Secondary | ICD-10-CM | POA: Diagnosis not present

## 2020-06-05 DIAGNOSIS — E669 Obesity, unspecified: Secondary | ICD-10-CM

## 2020-06-05 NOTE — Progress Notes (Signed)
Established Patient Office Visit  SUBJECTIVE:  Subjective  Patient ID: Eddie Carter, male    DOB: Oct 05, 1941  Age: 79 y.o. MRN: [id number]  CC:  Chief Complaint  Patient presents with  . Edema    Patient complains today of swelling in both feet x2 months    HPI Eddie Carter is a 79 y.o. male presenting today for swelling in his bilateral feet.   He notes that he has been having swelling in his feet for the last two months. He has been taking his Lasix 40 mg as prescribed, but his fluid has still been accumulated. He has been propping his feet up at night, but his swelling has still not gone down.   He would like to have a nurse come to his house, but he is on medicare and would have to pay out of pocket for that service. He does need more assistance at home than what he can provide for himself. He currently does not have any compression socks. He would like to get some that zip up, but he notes that its difficult to get some that fit the size of his legs.   He does have some recent social anxiety regarding the separation of him and his wife. He notes that they have agreed to a divorce. He recalls the happy times, but states that its for the better now.   Past Medical History:  Diagnosis Date  . Arthritis   . Asthma   . BPH (benign prostatic hyperplasia)   . 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 Complication of anesthesia    after septoplasty and uvulectomy-was in icu- [facility] - 10 yrs. ago  . COPD (chronic obstructive pulmonary disease) ([place])   . Depression   . Diabetes mellitus without complication ([place])   . Diverticulitis   . ED (erectile dysfunction)   . Full dentures   . GERD (gastroesophageal reflux disease)    no meds  . Headache    difficulty with headaches- 1950's , again in 1967, 2008- again problems with migrraines   . History of GI bleed   . History of multiple pulmonary nodules   . History of shingles   . History of urethral stricture   . HOH  (hard of hearing)   . HOH (hard of hearing)   . Hypogonadism in male   . IBS (irritable bowel syndrome)   . Joint pain   . Medial meniscus, posterior horn derangement    left knee  . Melanosis coli   . Obesity   . Pneumonia 2014   [facility]  . Shortness of breath   . Skin cancer   . Sleep apnea    uses a cpap, most nights   . Stroke [facility]) 1995   some speech and thought processes slow  . Traumatic tear of lateral meniscus of right knee    right knee  . Urinary frequency   . Urinary incontinence     Past Surgical History:  Procedure Laterality Date  . APPENDECTOMY  1963  . BACK SURGERY    . CARPAL TUNNEL RELEASE     left  . CATARACT EXTRACTION W/PHACO Left 09/13/2018   Procedure: CATARACT EXTRACTION PHACO AND INTRAOCULAR LENS PLACEMENT (IOC);  Surgeon: Birder Robson, MD;  Location: [facility] ORS;  Service: Ophthalmology;  Laterality: Left;  [place]  00:42 CDE 6.73 Fluid pack lot #[id number]  . CATARACT EXTRACTION W/PHACO Right 11/23/2019   Procedure: CATARACT EXTRACTION PHACO AND INTRAOCULAR  LENS PLACEMENT (IOC) RIGHT;  Surgeon: Birder Robson, MD;  Location: [facility] ORS;  Service: Ophthalmology;  Laterality: Right;  [place] 01:10.1 CDE 10.25 Fluid Pack Lot # [id number]  . COLONOSCOPY    . COLONOSCOPY WITH PROPOFOL N/A 12/07/2016   Procedure: COLONOSCOPY WITH PROPOFOL;  Surgeon: Lollie Sails, MD;  Location: [facility] ENDOSCOPY;  Service: Endoscopy;  Laterality: N/A;  . COLONOSCOPY WITH PROPOFOL N/A 06/26/2019   Procedure: COLONOSCOPY WITH PROPOFOL;  Surgeon: Lollie Sails, MD;  Location: [facility] ENDOSCOPY;  Service: Endoscopy;  Laterality: N/A;  . EYE SURGERY     foreign body removed fr. eye, ? side   . FOOT FOREIGN BODY REMOVAL  1976   left foot -multiple pieces glass  . HEMORROIDECTOMY  1995  . KNEE ARTHROSCOPY Bilateral 01/17/2013   Procedure: ARTHROSCOPY KNEE BILATERAL WITH MEDIAL AND LATERAL MENISECTOMIES;  Surgeon: Lorn Junes, MD;  Location: [place];  Service:  Orthopedics;  Laterality: Bilateral;  . KYPHOPLASTY N/A 07/23/2017   Procedure: KYPHOPLASTY- L1;  Surgeon: Hessie Knows, MD;  Location: [facility] ORS;  Service: Orthopedics;  Laterality: N/A;  . LUMBAR LAMINECTOMY N/A 11/05/2014   Procedure: LEFT L3-4 MICRODISCECTOMY;  Surgeon: Jessy Oto, MD;  Location: [place];  Service: Orthopedics;  Laterality: N/A;  . LUMBAR LAMINECTOMY/DECOMPRESSION MICRODISCECTOMY N/A 02/12/2015   Procedure: RE-DO LEFT L3-4 MICRODISCECTOMY;  Surgeon: Jessy Oto, MD;  Location: [place];  Service: Orthopedics;  Laterality: N/A;  . NASAL SEPTUM SURGERY  1995  . TONSILLECTOMY    . UPPER GI ENDOSCOPY    . UVULOPALATOPHARYNGOPLASTY (UPPP)/TONSILLECTOMY/SEPTOPLASTY  1995   same time with septoplasty-ended up ICU almost trached    Family History  Problem Relation Age of Onset  . Cancer Mother   . Heart attack Father   . Heart disease Other   . Arthritis Other   . Prostate cancer Neg Hx   . Kidney disease Neg Hx     Social History   Socioeconomic History  . Marital status: Married    Spouse name: Mardene Celeste  . Number of children: 2  . Years of education: 40  . Highest education level: Not on file  Occupational History    Comment: register of deeds  Tobacco Use  . Smoking status: Former Smoker    Packs/day: 1.50    Years: 40.00    Pack years: 60.00    Types: Cigarettes    Quit date: 01/14/1996    Years since quitting: 24.4  . Smokeless tobacco: Never Used  Vaping Use  . Vaping Use: Never used  Substance and Sexual Activity  . Alcohol use: No    Comment: wine in a month  . Drug use: No  . Sexual activity: Never  Other Topics Concern  . Not on file  Social History Narrative   Lives at home with wife, son   caffeine use - 2 cups coffee daily, sodas 1 a day, occas tea   Social Determinants of Health   Financial Resource Strain:   . Difficulty of Paying Living Expenses:   Food Insecurity:   . Worried About Charity fundraiser in the Last Year:   . Arts development officer in the Last Year:   Transportation Needs:   . Film/video editor (Medical):   Marland Kitchen Lack of Transportation (Non-Medical):   Physical Activity:   . Days of Exercise per Week:   . Minutes of Exercise per Session:   Stress:   . Feeling of Stress :   Social  Connections:   . Frequency of Communication with Friends and Family:   . Frequency of Social Gatherings with Friends and Family:   . Attends Religious Services:   . Active Member of Clubs or Organizations:   . Attends Archivist Meetings:   Marland Kitchen Marital Status:   Intimate Partner Violence:   . Fear of Current or Ex-Partner:   . Emotionally Abused:   Marland Kitchen Physically Abused:   . Sexually Abused:      Current Outpatient Medications:  .  acetaminophen (TYLENOL) 500 MG tablet, Take 1,000 mg by mouth every 6 (six) hours as needed for moderate pain. , Disp: , Rfl:  .  albuterol (VENTOLIN HFA) 108 (90 Base) MCG/ACT inhaler, Inhale 1 puff into the lungs every 6 (six) hours as needed for wheezing or shortness of breath., Disp: , Rfl:  .  budesonide-formoterol (SYMBICORT) 160-4.5 MCG/ACT inhaler, Inhale 2 puffs into the lungs 2 (two) times daily., Disp: 3 Inhaler, Rfl: 1 .  Cholecalciferol (VITAMIN D3 PO), Take 1 tablet by mouth daily., Disp: , Rfl:  .  clopidogrel (PLAVIX) 75 MG tablet, Take 75 mg by mouth daily. , Disp: , Rfl:  .  Coenzyme Q10 (CO Q-10) 100 MG CAPS, Take 100 mg by mouth daily. , Disp: , Rfl:  .  dutasteride (AVODART) 0.5 MG capsule, TAKE 1 CAPSULE BY MOUTH EVERY DAY, Disp: 90 capsule, Rfl: 3 .  EPINEPHrine 0.3 mg/0.3 mL IJ SOAJ injection, Inject 0.3 mg into the muscle as needed for anaphylaxis. , Disp: , Rfl:  .  fesoterodine (TOVIAZ) 8 MG TB24 tablet, Take 1 tablet (8 mg total) by mouth daily., Disp: 90 tablet, Rfl: 3 .  fluticasone (FLONASE) 50 MCG/ACT nasal spray, TAKE 2 SPRAYS INTO EACH NOSTRIL EVERY DAY, Disp: 16 g, Rfl: 5 .  fluticasone-salmeterol (ADVAIR HFA) 230-21 MCG/ACT inhaler, Inhale 2 puffs into  the lungs 2 (two) times daily., Disp: , Rfl:  .  furosemide (LASIX) 40 MG tablet, Take 40 mg by mouth daily. , Disp: , Rfl:  .  hydrOXYzine (ATARAX/VISTARIL) 10 MG tablet, Take 20 mg by mouth at bedtime. , Disp: , Rfl:  .  ketoconazole (NIZORAL) 2 % shampoo, Apply 1 application topically 2 (two) times a week. Apply to scalp twice weekly leave in for 5 to 10 mins., Disp: 120 mL, Rfl: 11 .  levothyroxine (SYNTHROID, LEVOTHROID) 50 MCG tablet, Take 50 mcg by mouth daily before breakfast. Take on an empty stomach with a glass of water at least 30-60 minutes before breakfast. , Disp: , Rfl:  .  lidocaine (LIDODERM) 5 %, Place 1 patch onto the skin daily. , Disp: , Rfl:  .  Melatonin 3 MG TABS, Take 1 tablet by mouth at bedtime., Disp: , Rfl:  .  RA KRILL OIL 500 MG CAPS, Take 500 mg by mouth daily. , Disp: , Rfl:  .  tamsulosin (FLOMAX) 0.4 MG CAPS capsule, TAKE 1 CAPSULE BY MOUTH EVERY DAY, Disp: 90 capsule, Rfl: 3 .  tiotropium (SPIRIVA) 18 MCG inhalation capsule, Place 1 capsule (18 mcg total) into inhaler and inhale daily., Disp: 90 capsule, Rfl: 1 .  TOLTERODINE TARTRATE PO, Take 4 mg by mouth daily., Disp: , Rfl:  .  vitamin B-12 (CYANOCOBALAMIN) 1000 MCG tablet, Take 1,000 mcg by mouth daily., Disp: , Rfl:    Allergies  Allergen Reactions  . Asa [Aspirin] Shortness Of Breath  . Bee Venom Anaphylaxis  . Nsaids Other (See Comments)    Makes him bleed.  Marland Kitchen  Tolmetin Other (See Comments)    Makes him bleed.    ROS Review of Systems  Constitutional: Negative.   HENT: Negative.   Eyes: Negative.   Respiratory: Negative.   Cardiovascular: Positive for leg swelling.  Gastrointestinal: Negative.   Endocrine: Negative.   Genitourinary: Negative.   Musculoskeletal: Positive for arthralgias (knee) and back pain.  Skin: Negative.   Allergic/Immunologic: Negative.   Neurological: Negative.   Hematological: Negative.   Psychiatric/Behavioral: The patient is nervous/anxious.   All other  systems reviewed and are negative.    OBJECTIVE:    Physical Exam Vitals reviewed.  Constitutional:      Appearance: Normal appearance.  Neck:     Vascular: No carotid bruit.  Cardiovascular:     Rate and Rhythm: Normal rate and regular rhythm.     Pulses: Normal pulses.     Heart sounds: Normal heart sounds.  Pulmonary:     Effort: Pulmonary effort is normal.     Breath sounds: Normal breath sounds.  Abdominal:     General: Bowel sounds are normal.     Palpations: Abdomen is soft. There is no hepatomegaly or splenomegaly.     Tenderness: There is no abdominal tenderness.     Hernia: No hernia is present.  Musculoskeletal:     Right lower leg: 3+ Pitting Edema present.     Left lower leg: 3+ Pitting Edema present.     Right foot: Swelling present.     Left foot: Swelling present.  Feet:     Right foot:     Toenail Condition: Right toenails are long.     Left foot:     Toenail Condition: Left toenails are long.  Skin:    Findings: No rash.  Neurological:     Mental Status: He is alert and oriented to person, place, and time.  Psychiatric:        Mood and Affect: Mood normal.        Behavior: Behavior normal.     BP (!) 135/58   Pulse 83  Wt Readings from Last 3 Encounters:  03/12/20 250 lb (113.4 kg)  02/20/20 250 lb (113.4 kg)  11/10/19 250 lb (113.4 kg)    Health Maintenance Due  Topic Date Due  . Hepatitis C Screening  Never done  . OPHTHALMOLOGY EXAM  Never done  . URINE MICROALBUMIN  Never done  . COVID-19 Vaccine (1) Never done  . TETANUS/TDAP  Never done  . PNA vac Low Risk Adult (1 of 2 - PCV13) Never done  . HEMOGLOBIN A1C  12/01/2017    There are no preventive care reminders to display for this patient.  CBC Latest Ref Rng & Units 07/15/2017 07/13/2017 01/16/2016  WBC 3.8 - 10.6 K/uL 12.0(H) 13.8(H) -  Hemoglobin 13.0 - 18.0 g/dL 13.8 13.6 -  Hematocrit 40 - 52 % 40.6 39.8(L) 44.1  Platelets 150 - 440 K/uL 163 181 -   CMP Latest Ref Rng &  Units 06/05/2020 07/13/2017 01/16/2016  Glucose 65 - 99 mg/dL 176(H) 124(H) -  BUN 7 - 25 mg/dL 17 11 -  Creatinine 0.70 - 1.18 mg/dL 1.09 0.87 -  Sodium 135 - 146 mmol/L 139 140 -  Potassium 3.5 - 5.3 mmol/L 3.9 4.0 -  Chloride 98 - 110 mmol/L 102 107 -  CO2 20 - 32 mmol/L 27 26 -  Calcium 8.6 - 10.3 mg/dL 9.0 8.9 -  Total Protein 6.1 - 8.1 g/dL 6.3 6.6 6.4  Total Bilirubin 0.2 -  1.2 mg/dL 0.6 1.0 0.3  Alkaline Phos 38 - 126 U/L - 54 57  AST 10 - 35 U/L 13 20 30   ALT 9 - 46 U/L 11 13(L) 34    Lab Results  Component Value Date   TSH 6.430 (H) 01/16/2016   Lab Results  Component Value Date   ALBUMIN 3.4 (L) 07/13/2017   ANIONGAP 7 07/13/2017   Lab Results  Component Value Date   CHOL 187 01/16/2016   CHOL 159 10/30/2014   CHOL 172 07/26/2013   HDL 26 (L) 01/16/2016   HDL 32 (L) 10/30/2014   HDL 37 (L) 07/26/2013   LDLCALC 110 (H) 01/16/2016   LDLCALC 98 10/30/2014   LDLCALC 85 07/26/2013   CHOLHDL 7.2 (H) 01/16/2016   Lab Results  Component Value Date   TRIG 253 (H) 01/16/2016   Lab Results  Component Value Date   HGBA1C 5.7 (H) 05/31/2017   HGBA1C 6.2 (H) 05/21/2015   HGBA1C 6.9 (H) 11/05/2014      ASSESSMENT & PLAN:   Problem List Items Addressed This Visit      Other   Chronic lumbar radicular pain (Location of Secondary source of pain) (Bilateral) (L4 Dermatome) (L>R) (Chronic)    Patient chronic back pain is stable.      Obesity (BMI 35.0-39.9 without comorbidity)    Patient weight problem is chronic.  His blood sugar is high so he was instructed on the diet.  AIC will be done with the blood test will be drawn next time      Chronic lower extremity pain (Location of Secondary source of pain) (Bilateral) (L>R) - Primary    Wheelchair-bound.  He has swelling of the both legs.  He has a failed back surgery with lumbar 4 dermatome level lesion He has bilateral swelling of the legs there is no tenderness in the calf.  Lasix 40 mg intramuscular to relieve  some of the fluid.      Relevant Orders   COMPLETE METABOLIC PANEL WITH GFR (Completed)   Edema of both lower legs    Patient has 3+ pedal edema of the both legs/peripheral pulses are not palpable.  There is no evidence of phlebitis.  I put the patient on Ace bandages on the both legs given him Lasix he will come back recheck. to have a sitter as soon as possible.  I will refer him to vascular         No orders of the defined types were placed in this encounter.    Follow-up: No follow-ups on file.    Dr. Jane Canary [facility] [address] [place], [place] [address]   By signing my name below, I, [employer], attest that this documentation has been prepared under the direction and in the presence of [name], MD. Electronically Signed: [name], MD 06/09/20, 8:54 PM   I personally performed the services described in this documentation, which was SCRIBED in my presence. The recorded information has been reviewed and considered accurate. It has been edited as necessary during review. [name], MD

## 2020-06-06 LAB — COMPLETE METABOLIC PANEL WITH GFR
AG Ratio: 1.4 (calc) (ref 1.0–2.5)
ALT: 11 U/L (ref 9–46)
AST: 13 U/L (ref 10–35)
Albumin: 3.7 g/dL (ref 3.6–5.1)
Alkaline phosphatase (APISO): 55 U/L (ref 35–144)
BUN: 17 mg/dL (ref 7–25)
CO2: 27 mmol/L (ref 20–32)
Calcium: 9 mg/dL (ref 8.6–10.3)
Chloride: 102 mmol/L (ref 98–110)
Creat: 1.09 mg/dL (ref 0.70–1.18)
GFR, Est African American: 75 mL/min/{1.73_m2} (ref >=60)
GFR, Est Non African American: 65 mL/min/{1.73_m2} (ref >=60)
Globulin: 2.6 g/dL (calc) (ref 1.9–3.7)
Glucose, Bld: 176 mg/dL — ABNORMAL HIGH (ref 65–99)
Potassium: 3.9 mmol/L (ref 3.5–5.3)
Sodium: 139 mmol/L (ref 135–146)
Total Bilirubin: 0.6 mg/dL (ref 0.2–1.2)
Total Protein: 6.3 g/dL (ref 6.1–8.1)

## 2020-06-09 ENCOUNTER — Encounter: Payer: Self-pay | Admitting: Internal Medicine

## 2020-06-09 NOTE — Assessment & Plan Note (Signed)
Wheelchair-bound.  He has swelling of the both legs.  He has a failed back surgery with lumbar 4 dermatome level lesion He has bilateral swelling of the legs there is no tenderness in the calf.  Lasix 40 mg intramuscular to relieve some of the fluid.

## 2020-06-09 NOTE — Assessment & Plan Note (Signed)
Patient chronic back pain is stable.

## 2020-06-09 NOTE — Assessment & Plan Note (Signed)
Patient weight problem is chronic.  His blood sugar is high so he was instructed on the diet.  AIC will be done with the blood test will be drawn next time

## 2020-06-09 NOTE — Assessment & Plan Note (Signed)
Patient has 3+ pedal edema of the both legs/peripheral pulses are not palpable.  There is no evidence of phlebitis.  I put the patient on Ace bandages on the both legs given him Lasix he will come back recheck. to have a sitter as soon as possible.  I will refer him to vascular

## 2020-06-10 NOTE — Addendum Note (Signed)
Addended by: Alois Cliche on: 06/10/2020 02:13 PM   Modules accepted: Orders

## 2020-06-14 ENCOUNTER — Other Ambulatory Visit: Payer: Self-pay

## 2020-06-14 ENCOUNTER — Ambulatory Visit (INDEPENDENT_AMBULATORY_CARE_PROVIDER_SITE_OTHER): Payer: Medicare PPO | Admitting: Vascular Surgery

## 2020-06-14 ENCOUNTER — Encounter (INDEPENDENT_AMBULATORY_CARE_PROVIDER_SITE_OTHER): Payer: Self-pay | Admitting: Vascular Surgery

## 2020-06-14 VITALS — BP 111/71 | HR 83 | Resp 16 | Ht 72.0 in | Wt 255.0 lb

## 2020-06-14 DIAGNOSIS — M7989 Other specified soft tissue disorders: Secondary | ICD-10-CM | POA: Diagnosis not present

## 2020-06-14 DIAGNOSIS — E1142 Type 2 diabetes mellitus with diabetic polyneuropathy: Secondary | ICD-10-CM | POA: Diagnosis not present

## 2020-06-14 DIAGNOSIS — R270 Ataxia, unspecified: Secondary | ICD-10-CM | POA: Diagnosis not present

## 2020-06-14 DIAGNOSIS — M5441 Lumbago with sciatica, right side: Secondary | ICD-10-CM | POA: Diagnosis not present

## 2020-06-14 DIAGNOSIS — G8929 Other chronic pain: Secondary | ICD-10-CM

## 2020-06-14 DIAGNOSIS — M2550 Pain in unspecified joint: Secondary | ICD-10-CM | POA: Insufficient documentation

## 2020-06-14 NOTE — Assessment & Plan Note (Signed)
The patient has pronounced swelling as well as an ulceration on the left leg and pronounced swelling on the right leg.  To get the swelling under control and to treat the wound, 3 layer Unna boot was placed on both lower extremities today.  This will be changed weekly.  I recommended a venous reflux study to be done in the near future at his convenience.  I discussed that there are a litany of things that can cause lower extremity swelling.  I discussed the hallmarks of treatment would be compression and elevation as well as increasing activity.  The activity portion sounds like that may be impossible.  We will see him back with weekly Unna boot changes for several weeks and discussed the ultrasound findings to determine what further treatment options would be appropriate going forward.

## 2020-06-14 NOTE — Progress Notes (Signed)
Patient ID: Eddie Carter, male   DOB: 05-Mar-1941, 79 y.o.   MRN: [id number]  Chief Complaint  Patient presents with  . New Patient (Initial Visit)    ref [place] edema    HPI Eddie Carter is a 79 y.o. male.  I am asked to see the patient by Dr. Lavera Guise for evaluation of leg swelling.  This has been a progressive problem over the past several weeks to months.  The patient has limited mobility which has worsened over time and his legs are dependent much of the day.  His primary care provider noted this and did some wraps but his feet and lower legs remain markedly swollen.  They are very heavy and tired.  No fevers or chills.  He does have a small skin wound on the left foot midfoot area.  Both legs are little swollen although the right leg may be a little worse than the left.  He has some significant arthritis as well.  His right knee is very painful and he has had several shots in there.  He also gets shots in his back.     Past Medical History:  Diagnosis Date  . Arthritis   . Asthma   . BPH (benign prostatic hyperplasia)   . 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 Complication of anesthesia    after septoplasty and uvulectomy-was in icu- [facility] - 10 yrs. ago  . COPD (chronic obstructive pulmonary disease) ([place])   . Depression   . Diabetes mellitus without complication ([place])   . Diverticulitis   . ED (erectile dysfunction)   . Full dentures   . GERD (gastroesophageal reflux disease)    no meds  . Headache    difficulty with headaches- 1950's , again in 1967, 2008- again problems with migrraines   . History of GI bleed   . History of multiple pulmonary nodules   . History of shingles   . History of urethral stricture   . HOH (hard of hearing)   . HOH (hard of hearing)   . Hypogonadism in male   . IBS (irritable bowel syndrome)   . Joint pain   . Medial meniscus, posterior horn derangement    left knee  . Melanosis coli   . Obesity   . Pneumonia 2014   [facility]  .  Shortness of breath   . Skin cancer   . Sleep apnea    uses a cpap, most nights   . Stroke [facility]) 1995   some speech and thought processes slow  . Traumatic tear of lateral meniscus of right knee    right knee  . Urinary frequency   . Urinary incontinence     Past Surgical History:  Procedure Laterality Date  . APPENDECTOMY  1963  . BACK SURGERY    . CARPAL TUNNEL RELEASE     left  . CATARACT EXTRACTION W/PHACO Left 09/13/2018   Procedure: CATARACT EXTRACTION PHACO AND INTRAOCULAR LENS PLACEMENT ([facility]);  Surgeon: Birder Robson, MD;  Location: [facility] ORS;  Service: Ophthalmology;  Laterality: Left;  [place]  00:42 CDE 6.73 Fluid pack lot #[id number]  . CATARACT EXTRACTION W/PHACO Right 11/23/2019   Procedure: CATARACT EXTRACTION PHACO AND INTRAOCULAR LENS PLACEMENT ([place]) RIGHT;  Surgeon: Birder Robson, MD;  Location: [facility] ORS;  Service: Ophthalmology;  Laterality: Right;  [place] 01:10.1 CDE 10.25 Fluid Pack Lot # [id number]  . COLONOSCOPY    . COLONOSCOPY WITH PROPOFOL N/A 12/07/2016  Procedure: COLONOSCOPY WITH PROPOFOL;  Surgeon: Lollie Sails, MD;  Location[facility] ENDOSCOPY;  Service: Endoscopy;  Laterality: N/A;  . COLONOSCOPY WITH PROPOFOL N/A 06/26/2019   Procedure: COLONOSCOPY WITH PROPOFOL;  Surgeon: Lollie Sails, MD;  Location[facility] ENDOSCOPY;  Service: Endoscopy;  Laterality: N/A;  . EYE SURGERY     foreign body removed fr. eye, ? side   . FOOT FOREIGN BODY REMOVAL  1976   left foot -multiple pieces glass  . HEMORROIDECTOMY  1995  . KNEE ARTHROSCOPY Bilateral 01/17/2013   Procedure: ARTHROSCOPY KNEE BILATERAL WITH MEDIAL AND LATERAL MENISECTOMIES;  Surgeon: Lorn Junes, MD;  Location: [place];  Service: Orthopedics;  Laterality: Bilateral;  . KYPHOPLASTY N/A 07/23/2017   Procedure: KYPHOPLASTY- L1;  Surgeon: Hessie Knows, MD;  Location: [facility] ORS;  Service: Orthopedics;  Laterality: N/A;  . LUMBAR LAMINECTOMY N/A 11/05/2014   Procedure: LEFT L3-4  MICRODISCECTOMY;  Surgeon: Jessy Oto, MD;  Location: [place];  Service: Orthopedics;  Laterality: N/A;  . LUMBAR LAMINECTOMY/DECOMPRESSION MICRODISCECTOMY N/A 02/12/2015   Procedure: RE-DO LEFT L3-4 MICRODISCECTOMY;  Surgeon: Jessy Oto, MD;  Location: [place];  Service: Orthopedics;  Laterality: N/A;  . NASAL SEPTUM SURGERY  1995  . TONSILLECTOMY    . UPPER GI ENDOSCOPY    . UVULOPALATOPHARYNGOPLASTY (UPPP)/TONSILLECTOMY/SEPTOPLASTY  1995   same time with septoplasty-ended up ICU almost trached     Family History  Problem Relation Age of Onset  . Cancer Mother   . Heart attack Father   . Heart disease Other   . Arthritis Other   . Prostate cancer Neg Hx   . Kidney disease Neg Hx       Social History   Tobacco Use  . Smoking status: Former Smoker    Packs/day: 1.50    Years: 40.00    Pack years: 60.00    Types: Cigarettes    Quit date: 01/14/1996    Years since quitting: 24.4  . Smokeless tobacco: Never Used  Vaping Use  . Vaping Use: Never used  Substance Use Topics  . Alcohol use: No    Comment: wine in a month  . Drug use: No     Allergies  Allergen Reactions  . Asa [Aspirin] Shortness Of Breath  . Bee Venom Anaphylaxis  . Nsaids Other (See Comments)    Makes him bleed.  . Tolmetin Other (See Comments)    Makes him bleed.    Current Outpatient Medications  Medication Sig Dispense Refill  . acetaminophen (TYLENOL) 500 MG tablet Take 1,000 mg by mouth every 6 (six) hours as needed for moderate pain.     Marland Kitchen albuterol (VENTOLIN HFA) 108 (90 Base) MCG/ACT inhaler Inhale 1 puff into the lungs every 6 (six) hours as needed for wheezing or shortness of breath.    . budesonide-formoterol (SYMBICORT) 160-4.5 MCG/ACT inhaler Inhale 2 puffs into the lungs 2 (two) times daily. 3 Inhaler 1  . Cholecalciferol (VITAMIN D3 PO) Take 1 tablet by mouth daily.    . clopidogrel (PLAVIX) 75 MG tablet Take 75 mg by mouth daily.     . Coenzyme Q10 (CO Q-10) 100 MG CAPS Take 100  mg by mouth daily.     Marland Kitchen dutasteride (AVODART) 0.5 MG capsule TAKE 1 CAPSULE BY MOUTH EVERY DAY 90 capsule 3  . EPINEPHrine 0.3 mg/0.3 mL IJ SOAJ injection Inject 0.3 mg into the muscle as needed for anaphylaxis.     . fesoterodine (TOVIAZ) 8 MG TB24 tablet  Take 1 tablet (8 mg total) by mouth daily. 90 tablet 3  . fluticasone (FLONASE) 50 MCG/ACT nasal spray TAKE 2 SPRAYS INTO EACH NOSTRIL EVERY DAY 16 g 5  . fluticasone-salmeterol (ADVAIR HFA) 230-21 MCG/ACT inhaler Inhale 2 puffs into the lungs 2 (two) times daily.    . furosemide (LASIX) 40 MG tablet Take 40 mg by mouth daily.     . hydrOXYzine (ATARAX/VISTARIL) 10 MG tablet Take 20 mg by mouth at bedtime.     Marland Kitchen ketoconazole (NIZORAL) 2 % shampoo Apply 1 application topically 2 (two) times a week. Apply to scalp twice weekly leave in for 5 to 10 mins. 120 mL 11  . levothyroxine (SYNTHROID, LEVOTHROID) 50 MCG tablet Take 50 mcg by mouth daily before breakfast. Take on an empty stomach with a glass of water at least 30-60 minutes before breakfast.     . lidocaine (LIDODERM) 5 % Place 1 patch onto the skin daily.     . Melatonin 3 MG TABS Take 1 tablet by mouth at bedtime.    Marland Kitchen RA KRILL OIL 500 MG CAPS Take 500 mg by mouth daily.     . tamsulosin (FLOMAX) 0.4 MG CAPS capsule TAKE 1 CAPSULE BY MOUTH EVERY DAY 90 capsule 3  . tiotropium (SPIRIVA) 18 MCG inhalation capsule Place 1 capsule (18 mcg total) into inhaler and inhale daily. 90 capsule 1  . TOLTERODINE TARTRATE PO Take 4 mg by mouth daily.    . vitamin B-12 (CYANOCOBALAMIN) 1000 MCG tablet Take 1,000 mcg by mouth daily.     No current facility-administered medications for this visit.      REVIEW OF SYSTEMS (Negative unless checked)  Constitutional: [] Weight loss  [] Fever  [] Chills Cardiac: [] Chest pain   [] Chest pressure   [] Palpitations   [] Shortness of breath when laying flat   [] Shortness of breath at rest   [] Shortness of breath with exertion. Vascular:  [] Pain in legs with  walking   [] Pain in legs at rest   [] Pain in legs when laying flat   [] Claudication   [] Pain in feet when walking  [] Pain in feet at rest  [] Pain in feet when laying flat   [] History of DVT   [] Phlebitis   [x] Swelling in legs   [] Varicose veins   [] Non-healing ulcers Pulmonary:   [] Uses home oxygen   [] Productive cough   [] Hemoptysis   [] Wheeze  [] COPD   [] Asthma Neurologic:  [] Dizziness  [] Blackouts   [] Seizures   [] History of stroke   [] History of TIA  [] Aphasia   [] Temporary blindness   [] Dysphagia   [x] Weakness or numbness in arms   [] Weakness or numbness in legs Musculoskeletal:  [x] Arthritis   [] Joint swelling   [x] Joint pain   [] Low back pain Hematologic:  [] Easy bruising  [] Easy bleeding   [] Hypercoagulable state   [] Anemic  [] Hepatitis Gastrointestinal:  [] Blood in stool   [] Vomiting blood  [] Gastroesophageal reflux/heartburn   [] Abdominal pain Genitourinary:  [] Chronic kidney disease   [] Difficult urination  [] Frequent urination  [] Burning with urination   [] Hematuria Skin:  [] Rashes   [] Ulcers   [] Wounds Psychological:  [] History of anxiety   []  History of major depression.    Physical Exam BP 111/71 (BP Location: Left Arm)   Pulse 83   Resp 16   Ht 6' (1.829 m)   Wt (!) 255 lb (115.7 kg)   BMI 34.58 kg/m  Gen:  WD/WN, NAD Head: [place]/AT, No temporalis wasting.  Ear/Nose/Throat: Hearing grossly intact, nares w/o erythema  or drainage, oropharynx w/o Erythema/Exudate Eyes: Conjunctiva clear, sclera non-icteric  Neck: trachea midline.  No JVD.  Pulmonary:  Good air movement, respirations not labored, no use of accessory muscles  Cardiac: irregular Vascular:  Vessel Right Left  Radial Palpable Palpable                          DP Not palpable Not palpable  PT Not palpable Not palpable   Gastrointestinal:. No masses, surgical incisions, or scars. Musculoskeletal: In a motorized wheelchair.  Extremities without ischemic changes.  No deformity or atrophy.  3+ bilateral lower  extremity edema. Neurologic: Sensation grossly intact in extremities.  Symmetrical.  Speech is fluent. Motor exam as listed above. Psychiatric: Judgment intact, Mood & affect appropriate for pt's clinical situation. Dermatologic: Small superficial wound on the left plantar midfoot.  Significant stasis dermatitis changes present to both lower extremities with skin thickening and stippling.    Radiology No results found.  Labs Recent Results (from the past 2160 hour(s))  COMPLETE METABOLIC PANEL WITH GFR     Status: Abnormal   Collection Time: 06/05/20 12:10 PM  Result Value Ref Range   Glucose, Bld 176 (H) 65 - 99 mg/dL    Comment: .            Fasting reference interval . For someone without known diabetes, a glucose value >125 mg/dL indicates that they may have diabetes and this should be confirmed with a follow-up test. .    BUN 17 7 - 25 mg/dL   Creat 1.09 0.70 - 1.18 mg/dL    Comment: For patients >57 years of age, the reference limit for Creatinine is approximately 13% higher for people identified as African-American. .    GFR, Est Non African American 65 > OR = 60 mL/min/1.28m2   GFR, Est African American 75 > OR = 60 mL/min/1.49m2   BUN/Creatinine Ratio NOT APPLICABLE 6 - 22 (calc)   Sodium 139 135 - 146 mmol/L   Potassium 3.9 3.5 - 5.3 mmol/L   Chloride 102 98 - 110 mmol/L   CO2 27 20 - 32 mmol/L   Calcium 9.0 8.6 - 10.3 mg/dL   Total Protein 6.3 6.1 - 8.1 g/dL   Albumin 3.7 3.6 - 5.1 g/dL   Globulin 2.6 1.9 - 3.7 g/dL (calc)   AG Ratio 1.4 1.0 - 2.5 (calc)   Total Bilirubin 0.6 0.2 - 1.2 mg/dL   Alkaline phosphatase (APISO) 55 35 - 144 U/L   AST 13 10 - 35 U/L   ALT 11 9 - 46 U/L    Assessment/Plan:  Diabetes mellitus (HCC) blood glucose control important in reducing the progression of atherosclerotic disease. Also, involved in wound healing. On appropriate medications.   Low back pain Sounds like he is getting epidural injections.  His mobility is  extremely limited which markedly worsens his lower extremity swelling.  Ataxia Balance is very poor.  He walks very little at this point and has frequent falls.  Swelling of limb The patient has pronounced swelling as well as an ulceration on the left leg and pronounced swelling on the right leg.  To get the swelling under control and to treat the wound, 3 layer Unna boot was placed on both lower extremities today.  This will be changed weekly.  I recommended a venous reflux study to be done in the near future at his convenience.  I discussed that there are a litany of things that can  cause lower extremity swelling.  I discussed the hallmarks of treatment would be compression and elevation as well as increasing activity.  The activity portion sounds like that may be impossible.  We will see him back with weekly Unna boot changes for several weeks and discussed the ultrasound findings to determine what further treatment options would be appropriate going forward.      Leotis Pain 06/14/2020, 12:26 PM   This note was created with Dragon medical transcription system.  Any errors from dictation are unintentional.

## 2020-06-14 NOTE — Assessment & Plan Note (Signed)
Balance is very poor.  He walks very little at this point and has frequent falls.

## 2020-06-14 NOTE — Assessment & Plan Note (Signed)
Sounds like he is getting epidural injections.  His mobility is extremely limited which markedly worsens his lower extremity swelling.

## 2020-06-14 NOTE — Assessment & Plan Note (Signed)
blood glucose control important in reducing the progression of atherosclerotic disease. Also, involved in wound healing. On appropriate medications.  

## 2020-06-21 ENCOUNTER — Encounter (INDEPENDENT_AMBULATORY_CARE_PROVIDER_SITE_OTHER): Payer: Managed Care, Other (non HMO) | Admitting: Vascular Surgery

## 2020-06-21 ENCOUNTER — Encounter (INDEPENDENT_AMBULATORY_CARE_PROVIDER_SITE_OTHER): Payer: Self-pay | Admitting: Nurse Practitioner

## 2020-06-21 ENCOUNTER — Other Ambulatory Visit: Payer: Self-pay

## 2020-06-21 ENCOUNTER — Ambulatory Visit (INDEPENDENT_AMBULATORY_CARE_PROVIDER_SITE_OTHER): Payer: Medicare PPO | Admitting: Nurse Practitioner

## 2020-06-21 DIAGNOSIS — R600 Localized edema: Secondary | ICD-10-CM

## 2020-06-21 DIAGNOSIS — M7989 Other specified soft tissue disorders: Secondary | ICD-10-CM

## 2020-06-21 DIAGNOSIS — I872 Venous insufficiency (chronic) (peripheral): Secondary | ICD-10-CM | POA: Diagnosis not present

## 2020-06-21 NOTE — Progress Notes (Signed)
History of Present Illness  There is no documented history at this time  Assessments & Plan   There are no diagnoses linked to this encounter.    Additional instructions  Subjective:  Patient presents with venous ulcer of the Bilateral lower extremity.    Procedure:  3 layer unna wrap was placed Bilateral lower extremity.   Plan:   Follow up in one week.  

## 2020-06-24 ENCOUNTER — Telehealth: Payer: Self-pay

## 2020-06-24 NOTE — Telephone Encounter (Signed)
Patient called triage reporting burning with urination since Friday. Patient denies fever, chills, nausea and/or vomiting. Patient denies any other urinary symptoms. Patient would like to see [facility]. Added appt for 8/3 with [facility].

## 2020-06-25 ENCOUNTER — Other Ambulatory Visit: Payer: Self-pay

## 2020-06-25 ENCOUNTER — Ambulatory Visit (INDEPENDENT_AMBULATORY_CARE_PROVIDER_SITE_OTHER): Payer: Medicare PPO | Admitting: Urology

## 2020-06-25 ENCOUNTER — Encounter: Payer: Self-pay | Admitting: Urology

## 2020-06-25 VITALS — BP 116/74 | HR 70 | Ht 72.0 in | Wt 250.0 lb

## 2020-06-25 DIAGNOSIS — R300 Dysuria: Secondary | ICD-10-CM | POA: Diagnosis not present

## 2020-06-25 DIAGNOSIS — N3941 Urge incontinence: Secondary | ICD-10-CM | POA: Diagnosis not present

## 2020-06-25 DIAGNOSIS — N138 Other obstructive and reflux uropathy: Secondary | ICD-10-CM

## 2020-06-25 DIAGNOSIS — N401 Enlarged prostate with lower urinary tract symptoms: Secondary | ICD-10-CM | POA: Diagnosis not present

## 2020-06-25 LAB — BLADDER SCAN AMB NON-IMAGING: Scan Result: 21

## 2020-06-25 MED ORDER — PHENAZOPYRIDINE HCL 100 MG PO TABS: 100.0000 mg | ORAL_TABLET | Freq: Three times a day (TID) | ORAL | 0 refills | Status: DC | PRN

## 2020-06-25 NOTE — Progress Notes (Signed)
06/25/2020 12:13 PM   Eddie Carter Jul 03, 1941 [id number]  Referring provider: [name], MD [place],  [place] [address]  Chief Complaint  Patient presents with  . Dysuria    HPI: [name] is a 79 year old male with BPH with LU TS, history of urethral strictures and urinary incontinence who presents today with the complaint of dysuria.    Cystoscopy in 04/2018 with Dr. Matilde Sprang noted he had minimal stricture disease but the scope easily passed.  He had mild bilobar enlargement of the prostate.  He had some lengthening of the prostatic urethra.  He a grade 1 of 4 bladder trabeculation.    Today, he is experiencing dysuria, difficulty urinating and a weak urinary stream.  This started 5 days ago.  The burning with the urination continues through the entire urinary stream and then slowly fades.  He denies any sexual activity.  Patient denies any modifying or aggravating factors.  Patient denies any gross hematuria or suprapubic/flank pain.  Patient denies any fevers, chills, nausea or vomiting.  His UA today is benign.  His PVR is 21 ml.   PMH: Past Medical History:  Diagnosis Date  . Arthritis   . Asthma   . BPH (benign prostatic hyperplasia)   . 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 Complication of anesthesia    after septoplasty and uvulectomy-[facility]  . COPD (chronic obstructive pulmonary disease) ([place])   . Depression   . Diabetes mellitus without complication ([place])   . Diverticulitis   . ED (erectile dysfunction)   . Full dentures   . GERD (gastroesophageal reflux disease)    no meds  . Headache    difficulty with headaches- 1950's , again in 1967, 2008- again problems with migrraines   . History of GI bleed   . History of multiple pulmonary nodules   . History of shingles   . History of urethral stricture   . HOH (hard of hearing)   . HOH (hard of hearing)   . Hypogonadism in male   . IBS (irritable  bowel syndrome)   . Joint pain   . Medial meniscus, posterior horn derangement    left knee  . Melanosis coli   . Obesity   . Pneumonia 2014   [facility]  . Shortness of breath   . Skin cancer   . Sleep apnea    uses a cpap, most nights   . Stroke [facility]) 1995   some speech and thought processes slow  . Traumatic tear of lateral meniscus of right knee    right knee  . Urinary frequency   . Urinary incontinence     Surgical History: Past Surgical History:  Procedure Laterality Date  . APPENDECTOMY  1963  . BACK SURGERY    . CARPAL TUNNEL RELEASE     left  . CATARACT EXTRACTION W/PHACO Left 09/13/2018   Procedure: CATARACT EXTRACTION PHACO AND INTRAOCULAR LENS PLACEMENT (IOC);  Surgeon: Birder Robson, MD;  Location: [facility] ORS;  Service: Ophthalmology;  Laterality: Left;  [place]  00:42 CDE 6.73 Fluid pack lot #[id number]  . CATARACT EXTRACTION W/PHACO Right 11/23/2019   Procedure: CATARACT EXTRACTION PHACO AND INTRAOCULAR LENS PLACEMENT ([place]) RIGHT;  Surgeon: Birder Robson, MD;  Location: [facility] ORS;  Service: Ophthalmology;  Laterality: Right;  [place] 01:10.1 CDE 10.25 Fluid Pack Lot # [id number]  . COLONOSCOPY    . COLONOSCOPY WITH PROPOFOL N/A  12/07/2016   Procedure: COLONOSCOPY WITH PROPOFOL;  Surgeon: Lollie Sails, MD;  Location: [facility] ENDOSCOPY;  Service: Endoscopy;  Laterality: N/A;  . COLONOSCOPY WITH PROPOFOL N/A 06/26/2019   Procedure: COLONOSCOPY WITH PROPOFOL;  Surgeon: Lollie Sails, MD;  Location: [facility] ENDOSCOPY;  Service: Endoscopy;  Laterality: N/A;  . EYE SURGERY     foreign body removed fr. eye, ? side   . FOOT FOREIGN BODY REMOVAL  1976   left foot -multiple pieces glass  . HEMORROIDECTOMY  1995  . KNEE ARTHROSCOPY Bilateral 01/17/2013   Procedure: ARTHROSCOPY KNEE BILATERAL WITH MEDIAL AND LATERAL MENISECTOMIES;  Surgeon: Lorn Junes, MD;  Location: [place];  Service: Orthopedics;  Laterality: Bilateral;  . KYPHOPLASTY N/A 07/23/2017    Procedure: KYPHOPLASTY- L1;  Surgeon: Hessie Knows, MD;  Location: [facility] ORS;  Service: Orthopedics;  Laterality: N/A;  . LUMBAR LAMINECTOMY N/A 11/05/2014   Procedure: LEFT L3-4 MICRODISCECTOMY;  Surgeon: Jessy Oto, MD;  Location: [place];  Service: Orthopedics;  Laterality: N/A;  . LUMBAR LAMINECTOMY/DECOMPRESSION MICRODISCECTOMY N/A 02/12/2015   Procedure: RE-DO LEFT L3-4 MICRODISCECTOMY;  Surgeon: Jessy Oto, MD;  Location: [place];  Service: Orthopedics;  Laterality: N/A;  . NASAL SEPTUM SURGERY  1995  . TONSILLECTOMY    . UPPER GI ENDOSCOPY    . UVULOPALATOPHARYNGOPLASTY (UPPP)/TONSILLECTOMY/SEPTOPLASTY  1995   same time with septoplasty-ended up ICU almost trached    Home Medications:  Allergies as of 06/25/2020      Reactions   Asa [aspirin] Shortness Of Breath   Bee Venom Anaphylaxis   Nsaids Other (See Comments)   Makes him bleed.   Tolmetin Other (See Comments)   Makes him bleed.      Medication List       Accurate as of June 25, 2020 12:13 PM. If you have any questions, ask your nurse or doctor.        acetaminophen 500 MG tablet Commonly known as: TYLENOL Take 1,000 mg by mouth every 6 (six) hours as needed for moderate pain.   albuterol 108 (90 Base) MCG/ACT inhaler Commonly known as: VENTOLIN HFA Inhale 1 puff into the lungs every 6 (six) hours as needed for wheezing or shortness of breath.   budesonide-formoterol 160-4.5 MCG/ACT inhaler Commonly known as: SYMBICORT Inhale 2 puffs into the lungs 2 (two) times daily.   clopidogrel 75 MG tablet Commonly known as: PLAVIX Take 75 mg by mouth daily.   Co Q-10 100 MG Caps Take 100 mg by mouth daily.   dutasteride 0.5 MG capsule Commonly known as: AVODART TAKE 1 CAPSULE BY MOUTH EVERY DAY   EPINEPHrine 0.3 mg/0.3 mL Soaj injection Commonly known as: EPI-PEN Inject 0.3 mg into the muscle as needed for anaphylaxis.   fesoterodine 8 MG Tb24 tablet Commonly known as: TOVIAZ Take 1 tablet (8 mg total) by  mouth daily.   fluticasone 50 MCG/ACT nasal spray Commonly known as: FLONASE TAKE 2 SPRAYS INTO EACH NOSTRIL EVERY DAY   fluticasone-salmeterol 230-21 MCG/ACT inhaler Commonly known as: ADVAIR HFA Inhale 2 puffs into the lungs 2 (two) times daily.   furosemide 40 MG tablet Commonly known as: LASIX Take 40 mg by mouth daily.   hydrOXYzine 10 MG tablet Commonly known as: ATARAX/VISTARIL Take 20 mg by mouth at bedtime.   ketoconazole 2 % shampoo Commonly known as: NIZORAL Apply 1 application topically 2 (two) times a week. Apply to scalp twice weekly leave in for 5 to 10 mins.   levothyroxine 50  MCG tablet Commonly known as: SYNTHROID Take 50 mcg by mouth daily before breakfast. Take on an empty stomach with a glass of water at least 30-60 minutes before breakfast.   lidocaine 5 % Commonly known as: LIDODERM Place 1 patch onto the skin daily.   melatonin 3 MG Tabs tablet Take 1 tablet by mouth at bedtime.   phenazopyridine 100 MG tablet Commonly known as: Pyridium Take 1 tablet (100 mg total) by mouth 3 (three) times daily as needed for pain. Started by: Zara Council, PA-C   RA Krill Oil 500 MG Caps Take 500 mg by mouth daily.   tamsulosin 0.4 MG Caps capsule Commonly known as: FLOMAX TAKE 1 CAPSULE BY MOUTH EVERY DAY   tiotropium 18 MCG inhalation capsule Commonly known as: SPIRIVA Place 1 capsule (18 mcg total) into inhaler and inhale daily.   TOLTERODINE TARTRATE PO Take 4 mg by mouth daily.   vitamin B-12 1000 MCG tablet Commonly known as: CYANOCOBALAMIN Take 1,000 mcg by mouth daily.   VITAMIN D3 PO Take 1 tablet by mouth daily.       Allergies:  Allergies  Allergen Reactions  . Asa [Aspirin] Shortness Of Breath  . Bee Venom Anaphylaxis  . Nsaids Other (See Comments)    Makes him bleed.  . Tolmetin Other (See Comments)    Makes him bleed.    Family History: Family History  Problem Relation Age of Onset  . Cancer Mother   . Heart attack  Father   . Heart disease Other   . Arthritis Other   . Prostate cancer Neg Hx   . Kidney disease Neg Hx     Social History:  reports that he quit smoking about 24 years ago. His smoking use included cigarettes. He has a 60.00 pack-year smoking history. He has never used smokeless tobacco. He reports that he does not drink alcohol and does not use drugs.  ROS: Pertinent ROS in HPI  Physical Exam: BP 116/74   Pulse 70   Ht 6' (1.829 m)   Wt 250 lb (113.4 kg)   BMI 33.91 kg/m   Constitutional:  Well nourished. Alert and oriented, No acute distress.  Elderly appearing.   HEENT: [place] AT, mask in place.  Trachea midline Cardiovascular: No clubbing, cyanosis, or edema. Respiratory: Normal respiratory effort, no increased work of breathing. Neurologic: Grossly intact, no focal deficits, moving all 4 extremities. Psychiatric: Normal mood and affect.  Laboratory Data: Lab Results  Component Value Date   WBC 12.0 (H) 07/15/2017   HGB 13.8 07/15/2017   HCT 40.6 07/15/2017   MCV 91.9 07/15/2017   PLT 163 07/15/2017    Lab Results  Component Value Date   CREATININE 1.09 06/05/2020    Lab Results  Component Value Date   TESTOSTERONE 194 (L) 01/16/2016    Lab Results  Component Value Date   HGBA1C 5.7 (H) 05/31/2017    Lab Results  Component Value Date   TSH 6.430 (H) 01/16/2016       Component Value Date/Time   CHOL 187 01/16/2016 0843   CHOL 159 10/30/2014 0540   HDL 26 (L) 01/16/2016 0843   HDL 32 (L) 10/30/2014 0540   CHOLHDL 7.2 (H) 01/16/2016 0843   VLDL 29 10/30/2014 0540   LDLCALC 110 (H) 01/16/2016 0843   LDLCALC 98 10/30/2014 0540    Lab Results  Component Value Date   AST 13 06/05/2020   Lab Results  Component Value Date   ALT 11 06/05/2020  Urinalysis Component     Latest Ref Rng & Units 06/25/2020  Specific Gravity, UA     1.005 - 1.030 1.020  pH, UA     5.0 - 7.5 6.0  Color, UA     Yellow Yellow  Appearance Ur     Clear Clear    Leukocytes,UA     Negative Negative  Protein,UA     Negative/Trace Negative  Glucose, UA     Negative Negative  Ketones, UA     Negative Negative  RBC, UA     Negative Negative  Bilirubin, UA     Negative Negative  Urobilinogen, Ur     0.2 - 1.0 mg/dL 0.2  Nitrite, UA     Negative Negative  Microscopic Examination      See below:   Component     Latest Ref Rng & Units 06/25/2020  WBC, UA     0 - 5 /hpf 0-5  RBC     0 - 2 /hpf 0-2  Epithelial Cells (non renal)     0 - 10 /hpf 0-10  Bacteria, UA     None seen/Few None seen    I have reviewed the labs.   Pertinent Imaging: Results for MATTEW, CHRISWELL (MRN [id number]) as of 06/27/2020 22:30  Ref. Range 06/25/2020 10:17  Scan Result Unknown 21     Assessment & Plan:    1. Dysuria UA is unremarkable - will send out for culture to rule out infection Patient has history of urethral strictures therefore we will schedule cystoscopy to evaluate for this and also rule out CIS as it may present this way and he has a history of smoking I have explained to the patient that they will  be scheduled for a cystoscopy in our office to evaluate their bladder.  The cystoscopy consists of passing a tube with a lens up through their urethra and into their urinary bladder.   We will inject the urethra with a lidocaine gel prior to introducing the cystoscope to help with any discomfort during the procedure.   After the procedure, they might experience blood in the urine and discomfort with urination.  This will abate after the first few voids.  I have  encouraged the patient to increase water intake  during this time.  Patient denies any allergies to lidocaine.   2. BPH with LU TS Continue conservative management, avoiding bladder irritants and timed voiding's Most bothersome symptoms is/are urge incontinence  Continue tamsulosin 0.4 mg daily and dutasteride 0.5 mg daily RTC prn   3. Urge incontinence Continue Toviaz 8 mg daily  Patient  satisfied with his urinary status at this time, he would like to return on a PRN basis  Return for schedule cystoscopy to rule out stricture/CIS .  These notes generated with voice recognition software. I apologize for typographical errors.  Zara Council, PA-C  [facility] [address]  [place], [place] [address] [phone]

## 2020-06-26 DIAGNOSIS — E119 Type 2 diabetes mellitus without complications: Secondary | ICD-10-CM | POA: Diagnosis not present

## 2020-06-26 DIAGNOSIS — G8929 Other chronic pain: Secondary | ICD-10-CM | POA: Diagnosis not present

## 2020-06-26 DIAGNOSIS — M545 Low back pain: Secondary | ICD-10-CM | POA: Diagnosis not present

## 2020-06-27 LAB — URINALYSIS, COMPLETE
Bilirubin, UA: NEGATIVE
Glucose, UA: NEGATIVE
Ketones, UA: NEGATIVE
Leukocytes,UA: NEGATIVE
Nitrite, UA: NEGATIVE
Protein,UA: NEGATIVE
RBC, UA: NEGATIVE
Specific Gravity, UA: 1.02 (ref 1.005–1.030)
Urobilinogen, Ur: 0.2 mg/dL (ref 0.2–1.0)
pH, UA: 6 (ref 5.0–7.5)

## 2020-06-27 LAB — MICROSCOPIC EXAMINATION: Bacteria, UA: NONE SEEN

## 2020-06-28 ENCOUNTER — Ambulatory Visit (INDEPENDENT_AMBULATORY_CARE_PROVIDER_SITE_OTHER): Payer: Medicare PPO | Admitting: Nurse Practitioner

## 2020-06-28 ENCOUNTER — Encounter (INDEPENDENT_AMBULATORY_CARE_PROVIDER_SITE_OTHER): Payer: Self-pay

## 2020-06-28 ENCOUNTER — Other Ambulatory Visit: Payer: Self-pay

## 2020-06-28 VITALS — BP 123/78 | HR 87 | Resp 16

## 2020-06-28 DIAGNOSIS — M7989 Other specified soft tissue disorders: Secondary | ICD-10-CM

## 2020-06-28 DIAGNOSIS — R600 Localized edema: Secondary | ICD-10-CM | POA: Diagnosis not present

## 2020-06-28 NOTE — Progress Notes (Signed)
History of Present Illness  There is no documented history at this time  Assessments & Plan   There are no diagnoses linked to this encounter.    Additional instructions  Subjective:  Patient presents with venous ulcer of the Bilateral lower extremity.    Procedure:  3 layer unna wrap was placed Bilateral lower extremity.   Plan:   Follow up in one week.  

## 2020-07-01 LAB — MYCOPLASMA / UREAPLASMA CULTURE
Mycoplasma hominis Culture: NEGATIVE
Ureaplasma urealyticum: NEGATIVE

## 2020-07-02 LAB — CULTURE, URINE COMPREHENSIVE

## 2020-07-05 ENCOUNTER — Ambulatory Visit (INDEPENDENT_AMBULATORY_CARE_PROVIDER_SITE_OTHER): Payer: Medicare PPO | Admitting: Nurse Practitioner

## 2020-07-05 ENCOUNTER — Encounter (INDEPENDENT_AMBULATORY_CARE_PROVIDER_SITE_OTHER): Payer: Medicare PPO

## 2020-07-08 ENCOUNTER — Ambulatory Visit: Payer: Medicare PPO | Admitting: Internal Medicine

## 2020-07-09 ENCOUNTER — Encounter (INDEPENDENT_AMBULATORY_CARE_PROVIDER_SITE_OTHER): Payer: Self-pay | Admitting: Nurse Practitioner

## 2020-07-09 ENCOUNTER — Other Ambulatory Visit: Payer: Self-pay

## 2020-07-09 ENCOUNTER — Ambulatory Visit (INDEPENDENT_AMBULATORY_CARE_PROVIDER_SITE_OTHER): Payer: Medicare PPO | Admitting: Nurse Practitioner

## 2020-07-09 VITALS — BP 108/76 | HR 71 | Ht 72.0 in | Wt 255.0 lb

## 2020-07-09 DIAGNOSIS — M7989 Other specified soft tissue disorders: Secondary | ICD-10-CM | POA: Diagnosis not present

## 2020-07-09 NOTE — Progress Notes (Signed)
History of Present Illness  There is no documented history at this time  Assessments & Plan   There are no diagnoses linked to this encounter.    Additional instructions  Subjective:  Patient presents with venous ulcer of the Bilateral lower extremity.    Procedure:  3 layer unna wrap was placed Bilateral lower extremity.   Plan:   Follow up in one week.  

## 2020-07-12 ENCOUNTER — Encounter: Payer: Self-pay | Admitting: Urology

## 2020-07-12 ENCOUNTER — Ambulatory Visit (INDEPENDENT_AMBULATORY_CARE_PROVIDER_SITE_OTHER): Payer: Medicare PPO | Admitting: Urology

## 2020-07-12 ENCOUNTER — Encounter (INDEPENDENT_AMBULATORY_CARE_PROVIDER_SITE_OTHER): Payer: Managed Care, Other (non HMO) | Admitting: Vascular Surgery

## 2020-07-12 ENCOUNTER — Other Ambulatory Visit: Payer: Self-pay

## 2020-07-12 VITALS — BP 117/59 | HR 89 | Ht 72.0 in | Wt 255.0 lb

## 2020-07-12 DIAGNOSIS — R300 Dysuria: Secondary | ICD-10-CM

## 2020-07-12 DIAGNOSIS — N401 Enlarged prostate with lower urinary tract symptoms: Secondary | ICD-10-CM

## 2020-07-12 DIAGNOSIS — N138 Other obstructive and reflux uropathy: Secondary | ICD-10-CM | POA: Diagnosis not present

## 2020-07-12 MED ORDER — UROGESIC-BLUE 81.6 MG PO TABS
ORAL_TABLET | ORAL | 0 refills | Status: DC
Start: 2020-07-12 — End: 2021-01-04

## 2020-07-12 NOTE — Progress Notes (Signed)
   07/12/20  CC:  Chief Complaint  Patient presents with  . Cysto    HPI: Eddie Carter 06/25/2020 for dysuria which has persisted, urinalysis unremarkable.  No improvement on phenazopyridine/Azo  Blood pressure (!) 117/59, pulse 89, height 6' (1.829 m), weight 255 lb (115.7 kg). NED. A&Ox3.   No respiratory distress   Abd soft, NT, ND Normal phallus with bilateral descended testicles  Cystoscopy Procedure Note  Patient identification was confirmed, informed consent was obtained, and patient was prepped using Betadine solution.  Lidocaine jelly was administered per urethral meatus.     Pre-Procedure: - Inspection reveals a normal caliber urethral meatus.  Procedure: The flexible cystoscope was introduced without difficulty - Wide caliber penile urethral strictures which do not inhibit the passage of the flexible cystoscope; no urethral mucosal abnormalities - Moderate lateral lobe enlargement prostate  - Mild elevation bladder neck - Bilateral ureteral orifices identified - Bladder mucosa  reveals no ulcers, tumors, or lesions - No bladder stones -Mild trabeculation  Retroflexion shows no abnormalities   Post-Procedure: - Patient tolerated the procedure well  Assessment/ Plan:  No findings on cystoscopy that would identify an etiology of his dysuria  Trial Urogesic-Blue; Rx sent to pharmacy  Follow-up in 1 month for symptom recheck   Abbie Sons, MD

## 2020-07-13 ENCOUNTER — Encounter: Payer: Self-pay | Admitting: Urology

## 2020-07-15 LAB — URINALYSIS, COMPLETE
Bilirubin, UA: NEGATIVE
Ketones, UA: NEGATIVE
Leukocytes,UA: NEGATIVE
Nitrite, UA: POSITIVE — AB
Protein,UA: NEGATIVE
RBC, UA: NEGATIVE
Specific Gravity, UA: 1.02 (ref 1.005–1.030)
Urobilinogen, Ur: 1 mg/dL (ref 0.2–1.0)
pH, UA: 5 (ref 5.0–7.5)

## 2020-07-15 LAB — MICROSCOPIC EXAMINATION

## 2020-07-17 ENCOUNTER — Ambulatory Visit (INDEPENDENT_AMBULATORY_CARE_PROVIDER_SITE_OTHER): Payer: Medicare PPO | Admitting: Nurse Practitioner

## 2020-07-17 ENCOUNTER — Other Ambulatory Visit: Payer: Self-pay

## 2020-07-17 ENCOUNTER — Ambulatory Visit (INDEPENDENT_AMBULATORY_CARE_PROVIDER_SITE_OTHER): Payer: Medicare PPO

## 2020-07-17 ENCOUNTER — Encounter (INDEPENDENT_AMBULATORY_CARE_PROVIDER_SITE_OTHER): Payer: Self-pay | Admitting: Nurse Practitioner

## 2020-07-17 VITALS — BP 133/73 | HR 98 | Resp 16

## 2020-07-17 DIAGNOSIS — E781 Pure hyperglyceridemia: Secondary | ICD-10-CM | POA: Diagnosis not present

## 2020-07-17 DIAGNOSIS — I890 Lymphedema, not elsewhere classified: Secondary | ICD-10-CM

## 2020-07-17 DIAGNOSIS — E1142 Type 2 diabetes mellitus with diabetic polyneuropathy: Secondary | ICD-10-CM | POA: Diagnosis not present

## 2020-07-17 DIAGNOSIS — M7989 Other specified soft tissue disorders: Secondary | ICD-10-CM

## 2020-07-18 ENCOUNTER — Encounter (INDEPENDENT_AMBULATORY_CARE_PROVIDER_SITE_OTHER): Payer: Self-pay | Admitting: Nurse Practitioner

## 2020-07-18 NOTE — Progress Notes (Signed)
Subjective:    Patient ID: Eddie Carter, male    DOB: 1940/12/18, 79 y.o.   MRN: [id number] Chief Complaint  Patient presents with  . Follow-up    unna check    Eddie Carter is a 79 y.o. male.  The patient returns to the office for noninvasive studies regard to his lower extremity edema.  The patient notes that the swelling has been worsening for the last several months.  The patient has a broken back and due to this he has very limited mobility which leaves him dependent much of the day.  The patient has been in [place] wraps for the last 4 weeks and notes that his lower extremities do feel better however he still has some persistent swelling in his feet with the right being worse than the left.  The patient does not have any ulcerations bilaterally.  The patient also has arthritis which makes mobility difficult as well.  The patient notes that he has something like a lymphedema pump at home however he has not utilized it.  He denies any fever, chills, nausea, vomiting or diarrhea.  Today noninvasive studies show no evidence of DVT or superficial venous thrombosis bilaterally.  No evidence of deep venous insufficiency bilaterally.  No evidence of superficial venous reflux seen in the great or short saphenous vein bilaterally.      Review of Systems  Cardiovascular: Positive for leg swelling.  Neurological: Positive for weakness and numbness.  All other systems reviewed and are negative.      Objective:   Physical Exam Vitals reviewed.  HENT:     Head: Normocephalic.  Cardiovascular:     Rate and Rhythm: Normal rate and regular rhythm.     Pulses: Normal pulses.  Pulmonary:     Effort: Pulmonary effort is normal.  Musculoskeletal:     Right lower leg: 2+ Pitting Edema present.     Left lower leg: 2+ Pitting Edema present.  Skin:    General: Skin is warm and dry.  Neurological:     Mental Status: He is alert and oriented to person, place, and time.     Motor: Weakness  present.     Coordination: Coordination abnormal.     Gait: Gait abnormal.  Psychiatric:        Mood and Affect: Mood normal.        Behavior: Behavior normal.        Thought Content: Thought content normal.        Judgment: Judgment normal.     BP 133/73 (BP Location: Left Arm)   Pulse 98   Resp 16   Past Medical History:  Diagnosis Date  . Arthritis   . Asthma   . BPH (benign prostatic hyperplasia)   . 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 Complication of anesthesia    after septoplasty and uvulectomy-was in icu- [facility] - 10 yrs. ago  . COPD (chronic obstructive pulmonary disease) ([place])   . Depression   . Diabetes mellitus without complication ([place])   . Diverticulitis   . ED (erectile dysfunction)   . Full dentures   . GERD (gastroesophageal reflux disease)    no meds  . Headache    difficulty with headaches- 1950's , again in 1967, 2008- again problems with migrraines   . History of GI bleed   . History of multiple pulmonary nodules   . History of shingles   . History of urethral stricture   .  HOH (hard of hearing)   . HOH (hard of hearing)   . Hypogonadism in male   . IBS (irritable bowel syndrome)   . Joint pain   . Medial meniscus, posterior horn derangement    left knee  . Melanosis coli   . Obesity   . Pneumonia 2014   [facility]  . Shortness of breath   . Skin cancer   . Sleep apnea    uses a cpap, most nights   . Stroke [facility]) 1995   some speech and thought processes slow  . Traumatic tear of lateral meniscus of right knee    right knee  . Urinary frequency   . Urinary incontinence     Social History   Socioeconomic History  . Marital status: Married    Spouse name: Mardene Celeste  . Number of children: 2  . Years of education: 11  . Highest education level: Not on file  Occupational History    Comment: register of deeds  Tobacco Use  . Smoking status: Former Smoker    Packs/day: 1.50    Years: 40.00    Pack years: 60.00    Types:  Cigarettes    Quit date: 01/14/1996    Years since quitting: 24.5  . Smokeless tobacco: Never Used  Vaping Use  . Vaping Use: Never used  Substance and Sexual Activity  . Alcohol use: No    Comment: wine in a month  . Drug use: No  . Sexual activity: Never  Other Topics Concern  . Not on file  Social History Narrative   Lives at home with wife, son   caffeine use - 2 cups coffee daily, sodas 1 a day, occas tea   Social Determinants of Health   Financial Resource Strain:   . Difficulty of Paying Living Expenses: Not on file  Food Insecurity:   . Worried About Charity fundraiser in the Last Year: Not on file  . Ran Out of Food in the Last Year: Not on file  Transportation Needs:   . Lack of Transportation (Medical): Not on file  . Lack of Transportation (Non-Medical): Not on file  Physical Activity:   . Days of Exercise per Week: Not on file  . Minutes of Exercise per Session: Not on file  Stress:   . Feeling of Stress : Not on file  Social Connections:   . Frequency of Communication with Friends and Family: Not on file  . Frequency of Social Gatherings with Friends and Family: Not on file  . Attends Religious Services: Not on file  . Active Member of Clubs or Organizations: Not on file  . Attends Archivist Meetings: Not on file  . Marital Status: Not on file  Intimate Partner Violence:   . Fear of Current or Ex-Partner: Not on file  . Emotionally Abused: Not on file  . Physically Abused: Not on file  . Sexually Abused: Not on file    Past Surgical History:  Procedure Laterality Date  . APPENDECTOMY  1963  . BACK SURGERY    . CARPAL TUNNEL RELEASE     left  . CATARACT EXTRACTION W/PHACO Left 09/13/2018   Procedure: CATARACT EXTRACTION PHACO AND INTRAOCULAR LENS PLACEMENT (IOC);  Surgeon: Birder Robson, MD;  Location: [facility] ORS;  Service: Ophthalmology;  Laterality: Left;  [place]  00:42 CDE 6.73 Fluid pack lot #[id number]  . CATARACT EXTRACTION W/PHACO  Right 11/23/2019   Procedure: CATARACT EXTRACTION PHACO AND INTRAOCULAR LENS PLACEMENT (IOC)  RIGHT;  Surgeon: Birder Robson, MD;  Location: [facility] ORS;  Service: Ophthalmology;  Laterality: Right;  [place] 01:10.1 CDE 10.25 Fluid Pack Lot # [id number]  . COLONOSCOPY    . COLONOSCOPY WITH PROPOFOL N/A 12/07/2016   Procedure: COLONOSCOPY WITH PROPOFOL;  Surgeon: Lollie Sails, MD;  Location: [facility] ENDOSCOPY;  Service: Endoscopy;  Laterality: N/A;  . COLONOSCOPY WITH PROPOFOL N/A 06/26/2019   Procedure: COLONOSCOPY WITH PROPOFOL;  Surgeon: Lollie Sails, MD;  Location: [facility] ENDOSCOPY;  Service: Endoscopy;  Laterality: N/A;  . EYE SURGERY     foreign body removed fr. eye, ? side   . FOOT FOREIGN BODY REMOVAL  1976   left foot -multiple pieces glass  . HEMORROIDECTOMY  1995  . KNEE ARTHROSCOPY Bilateral 01/17/2013   Procedure: ARTHROSCOPY KNEE BILATERAL WITH MEDIAL AND LATERAL MENISECTOMIES;  Surgeon: Lorn Junes, MD;  Location: [place];  Service: Orthopedics;  Laterality: Bilateral;  . KYPHOPLASTY N/A 07/23/2017   Procedure: KYPHOPLASTY- L1;  Surgeon: Hessie Knows, MD;  Location: [facility] ORS;  Service: Orthopedics;  Laterality: N/A;  . LUMBAR LAMINECTOMY N/A 11/05/2014   Procedure: LEFT L3-4 MICRODISCECTOMY;  Surgeon: Jessy Oto, MD;  Location: [place];  Service: Orthopedics;  Laterality: N/A;  . LUMBAR LAMINECTOMY/DECOMPRESSION MICRODISCECTOMY N/A 02/12/2015   Procedure: RE-DO LEFT L3-4 MICRODISCECTOMY;  Surgeon: Jessy Oto, MD;  Location: [place];  Service: Orthopedics;  Laterality: N/A;  . NASAL SEPTUM SURGERY  1995  . TONSILLECTOMY    . UPPER GI ENDOSCOPY    . UVULOPALATOPHARYNGOPLASTY (UPPP)/TONSILLECTOMY/SEPTOPLASTY  1995   same time with septoplasty-ended up ICU almost trached    Family History  Problem Relation Age of Onset  . Cancer Mother   . Heart attack Father   . Heart disease Other   . Arthritis Other   . Prostate cancer Neg Hx   . Kidney disease Neg  Hx     Allergies  Allergen Reactions  . Asa [Aspirin] Shortness Of Breath  . Bee Venom Anaphylaxis  . Nsaids Other (See Comments)    Makes him bleed.  . Tolmetin Other (See Comments)    Makes him bleed.       Assessment & Plan:   1. Lymphedema No surgery or intervention at this point in time.    I have reviewed my discussion with the patient regarding venous insufficiency and secondary lymph edema and why it  causes symptoms. I have discussed with the patient the chronic skin changes that accompany these problems and the long term sequela such as ulceration and infection.  Today we will place the patient back in [place] wraps in order to ensure that he has compression while he is obtaining zippered medical grade compression stockings.  The patient is advised that the stockings should be worn on a daily basis, by putting them on for sitting in the morning and removing them in the evening.  He is also specifically instructed not to sleep in the stockings.  He is advised to elevate his lower extremities as much as possible.  Due to his lower back issues ambulation is near impossible.  The patient also notes that he has something like a lymphedema pump at home.  He is advised to try to utilize it at least an hour daily.  The patient does have new home health beginning on Monday that should be helpful.  Previous duplex ultrasound of the lower extremities shows normal deep venous system, superficial reflux was not present.   Following the review  of the ultrasound the patient will follow up in 3 months to reassess the patient's lower extremity swelling.  However the patient is advised to contact her office if ulceration or weeping occurs prior to follow-up visit.   2. Type 2 diabetes mellitus with diabetic polyneuropathy, without long-term current use of insulin (HCC) Continue hypoglycemic medications as already ordered, these medications have been reviewed and there are no changes at this  time.  Hgb A1C to be monitored as already arranged by primary service   3. Hypertriglyceridemia Continue statin as ordered and reviewed, no changes at this time    Current Outpatient Medications on File Prior to Visit  Medication Sig Dispense Refill  . acetaminophen (TYLENOL) 500 MG tablet Take 1,000 mg by mouth every 6 (six) hours as needed for moderate pain.     Marland Kitchen albuterol (VENTOLIN HFA) 108 (90 Base) MCG/ACT inhaler Inhale 1 puff into the lungs every 6 (six) hours as needed for wheezing or shortness of breath.    . budesonide-formoterol (SYMBICORT) 160-4.5 MCG/ACT inhaler Inhale 2 puffs into the lungs 2 (two) times daily. 3 Inhaler 1  . Cholecalciferol (VITAMIN D3 PO) Take 1 tablet by mouth daily.    . clopidogrel (PLAVIX) 75 MG tablet Take 75 mg by mouth daily.     . Coenzyme Q10 (CO Q-10) 100 MG CAPS Take 100 mg by mouth daily.     Marland Kitchen donepezil (ARICEPT) 10 MG tablet Take 10 mg by mouth at bedtime.    . dutasteride (AVODART) 0.5 MG capsule TAKE 1 CAPSULE BY MOUTH EVERY DAY 90 capsule 3  . EPINEPHrine 0.3 mg/0.3 mL IJ SOAJ injection Inject 0.3 mg into the muscle as needed for anaphylaxis.     . fluticasone (FLONASE) 50 MCG/ACT nasal spray TAKE 2 SPRAYS INTO EACH NOSTRIL EVERY DAY 16 g 5  . fluticasone-salmeterol (ADVAIR HFA) 230-21 MCG/ACT inhaler Inhale 2 puffs into the lungs 2 (two) times daily.    . furosemide (LASIX) 40 MG tablet Take 40 mg by mouth daily.     Marland Kitchen glucosamine-chondroitin 500-400 MG tablet Take 1 tablet by mouth 3 (three) times daily.    . hydrOXYzine (ATARAX/VISTARIL) 10 MG tablet Take 20 mg by mouth at bedtime.     Marland Kitchen ketoconazole (NIZORAL) 2 % shampoo Apply 1 application topically 2 (two) times a week. Apply to scalp twice weekly leave in for 5 to 10 mins. 120 mL 11  . levothyroxine (SYNTHROID, LEVOTHROID) 50 MCG tablet Take 50 mcg by mouth daily before breakfast. Take on an empty stomach with a glass of water at least 30-60 minutes before breakfast.     .  lidocaine (LIDODERM) 5 % Place 1 patch onto the skin daily.     . Melatonin 3 MG TABS Take 1 tablet by mouth at bedtime.    . Methen-Hyosc-Meth Blue-Na Phos (UROGESIC-BLUE) 81.6 MG TABS 1 tab every 8 hours as needed for burning 20 tablet 0  . mirabegron ER (MYRBETRIQ) 25 MG TB24 tablet Take 25 mg by mouth daily.    . phenazopyridine (PYRIDIUM) 100 MG tablet Take 1 tablet (100 mg total) by mouth 3 (three) times daily as needed for pain. 10 tablet 0  . RA KRILL OIL 500 MG CAPS Take 500 mg by mouth daily.     . tamsulosin (FLOMAX) 0.4 MG CAPS capsule TAKE 1 CAPSULE BY MOUTH EVERY DAY 90 capsule 3  . tiotropium (SPIRIVA) 18 MCG inhalation capsule Place 1 capsule (18 mcg total) into inhaler and inhale daily. [place]  capsule 1  . TOLTERODINE TARTRATE PO Take 4 mg by mouth daily.    Marland Kitchen topiramate (TOPAMAX) 50 MG tablet Take 50 mg by mouth 2 (two) times daily.    . Turmeric (QC TUMERIC COMPLEX) 500 MG CAPS Take by mouth.    . vitamin B-12 (CYANOCOBALAMIN) 1000 MCG tablet Take 1,000 mcg by mouth daily.     No current facility-administered medications on file prior to visit.    There are no Patient Instructions on file for this visit. No follow-ups on file.   Kris Hartmann, NP

## 2020-08-02 DIAGNOSIS — M25562 Pain in left knee: Secondary | ICD-10-CM | POA: Diagnosis not present

## 2020-08-02 DIAGNOSIS — Z794 Long term (current) use of insulin: Secondary | ICD-10-CM | POA: Diagnosis not present

## 2020-08-02 DIAGNOSIS — M25561 Pain in right knee: Secondary | ICD-10-CM | POA: Diagnosis not present

## 2020-08-02 DIAGNOSIS — E0821 Diabetes mellitus due to underlying condition with diabetic nephropathy: Secondary | ICD-10-CM | POA: Diagnosis not present

## 2020-08-02 DIAGNOSIS — Z6841 Body Mass Index (BMI) 40.0 and over, adult: Secondary | ICD-10-CM | POA: Diagnosis not present

## 2020-08-15 ENCOUNTER — Ambulatory Visit: Payer: Self-pay | Admitting: Urology

## 2020-09-06 ENCOUNTER — Ambulatory Visit (INDEPENDENT_AMBULATORY_CARE_PROVIDER_SITE_OTHER): Payer: Medicare PPO | Admitting: Family Medicine

## 2020-09-06 ENCOUNTER — Other Ambulatory Visit: Payer: Self-pay

## 2020-09-06 ENCOUNTER — Encounter: Payer: Self-pay | Admitting: Family Medicine

## 2020-09-06 VITALS — BP 125/80 | HR 82 | Ht 72.0 in

## 2020-09-06 DIAGNOSIS — G250 Essential tremor: Secondary | ICD-10-CM | POA: Insufficient documentation

## 2020-09-06 DIAGNOSIS — M1990 Unspecified osteoarthritis, unspecified site: Secondary | ICD-10-CM

## 2020-09-06 DIAGNOSIS — G4737 Central sleep apnea in conditions classified elsewhere: Secondary | ICD-10-CM

## 2020-09-06 NOTE — Progress Notes (Signed)
Established Patient Office Visit  SUBJECTIVE:  Subjective  Patient ID: Eddie Carter, male    DOB: June 11, 1941  Age: 79 y.o. MRN: [id number]  CC:  Chief Complaint  Patient presents with  . Shaking    patient complains of shakiness at times that has been occuring over the past year, states that it has worsened over the past couple of months     HPI Eddie Carter is a 79 y.o. male presenting today for an evaluation of his shaking.   He states that he has been having issues with tremors in his hands recently. He believe that his mom may have had a tremor, but he isn't sure. He doesn't believe that his father had a tremor.   He also continues to have difficulty with the swelling in his feet. He does have severe pain in his knees.    Past Medical History:  Diagnosis Date  . Arthritis   . Asthma   . BPH (benign prostatic hyperplasia)   . 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 Complication of anesthesia    after septoplasty and uvulectomy-was in icu- [facility] - 10 yrs. ago  . COPD (chronic obstructive pulmonary disease) ([place])   . Depression   . Diabetes mellitus without complication ([place])   . Diverticulitis   . ED (erectile dysfunction)   . Full dentures   . GERD (gastroesophageal reflux disease)    no meds  . Headache    difficulty with headaches- 1950's , again in 1967, 2008- again problems with migrraines   . History of GI bleed   . History of multiple pulmonary nodules   . History of shingles   . History of urethral stricture   . HOH (hard of hearing)   . HOH (hard of hearing)   . Hypogonadism in male   . IBS (irritable bowel syndrome)   . Joint pain   . Medial meniscus, posterior horn derangement    left knee  . Melanosis coli   . Obesity   . Pneumonia 2014   [facility]  . Shortness of breath   . Skin cancer   . Sleep apnea    uses a cpap, most nights   . Stroke [facility]) 1995   some speech and thought processes slow  . Traumatic tear of lateral meniscus of  right knee    right knee  . Urinary frequency   . Urinary incontinence     Past Surgical History:  Procedure Laterality Date  . APPENDECTOMY  1963  . BACK SURGERY    . CARPAL TUNNEL RELEASE     left  . CATARACT EXTRACTION W/PHACO Left 09/13/2018   Procedure: CATARACT EXTRACTION PHACO AND INTRAOCULAR LENS PLACEMENT (IOC);  Surgeon: Birder Robson, MD;  Location: [facility] ORS;  Service: Ophthalmology;  Laterality: Left;  [place]  00:42 CDE 6.73 Fluid pack lot #[id number]  . CATARACT EXTRACTION W/PHACO Right 11/23/2019   Procedure: CATARACT EXTRACTION PHACO AND INTRAOCULAR LENS PLACEMENT ([place]) RIGHT;  Surgeon: Birder Robson, MD;  Location: [facility] ORS;  Service: Ophthalmology;  Laterality: Right;  [place] 01:10.1 CDE 10.25 Fluid Pack Lot # [id number]  . COLONOSCOPY    . COLONOSCOPY WITH PROPOFOL N/A 12/07/2016   Procedure: COLONOSCOPY WITH PROPOFOL;  Surgeon: Lollie Sails, MD;  Location: [facility] ENDOSCOPY;  Service: Endoscopy;  Laterality: N/A;  . COLONOSCOPY WITH PROPOFOL N/A 06/26/2019   Procedure: COLONOSCOPY WITH PROPOFOL;  Surgeon: Lollie Sails, MD;  Location: [facility] ENDOSCOPY;  Service: Endoscopy;  Laterality: N/A;  . EYE SURGERY     foreign body removed fr. eye, ? side   . FOOT FOREIGN BODY REMOVAL  1976   left foot -multiple pieces glass  . HEMORROIDECTOMY  1995  . KNEE ARTHROSCOPY Bilateral 01/17/2013   Procedure: ARTHROSCOPY KNEE BILATERAL WITH MEDIAL AND LATERAL MENISECTOMIES;  Surgeon: Lorn Junes, MD;  Location: [facility];  Service: Orthopedics;  Laterality: Bilateral;  . KYPHOPLASTY N/A 07/23/2017   Procedure: KYPHOPLASTY- L1;  Surgeon: Hessie Knows, MD;  Location: [facility] ORS;  Service: Orthopedics;  Laterality: N/A;  . LUMBAR LAMINECTOMY N/A 11/05/2014   Procedure: LEFT L3-4 MICRODISCECTOMY;  Surgeon: Jessy Oto, MD;  Location: [place];  Service: Orthopedics;  Laterality: N/A;  . LUMBAR LAMINECTOMY/DECOMPRESSION MICRODISCECTOMY N/A 02/12/2015   Procedure:  RE-DO LEFT L3-4 MICRODISCECTOMY;  Surgeon: Jessy Oto, MD;  Location: [place];  Service: Orthopedics;  Laterality: N/A;  . NASAL SEPTUM SURGERY  1995  . TONSILLECTOMY    . UPPER GI ENDOSCOPY    . UVULOPALATOPHARYNGOPLASTY (UPPP)/TONSILLECTOMY/SEPTOPLASTY  1995   same time with septoplasty-ended up ICU almost trached    Family History  Problem Relation Age of Onset  . Cancer Mother   . Heart attack Father   . Heart disease Father   . Heart disease Other   . Arthritis Other   . Prostate cancer Neg Hx   . Kidney disease Neg Hx     Social History   Socioeconomic History  . Marital status: Married    Spouse name: Mardene Celeste  . Number of children: 2  . Years of education: 94  . Highest education level: Not on file  Occupational History    Comment: register of deeds  Tobacco Use  . Smoking status: Former Smoker    Packs/day: 1.50    Years: 40.00    Pack years: 60.00    Types: Cigarettes    Quit date: 01/14/1996    Years since quitting: 24.7  . Smokeless tobacco: Never Used  Vaping Use  . Vaping Use: Never used  Substance and Sexual Activity  . Alcohol use: No    Comment: wine in a month  . Drug use: No  . Sexual activity: Never  Other Topics Concern  . Not on file  Social History Narrative   Lives at home with wife, son   caffeine use - 2 cups coffee daily, sodas 1 a day, occas tea   Social Determinants of Health   Financial Resource Strain:   . Difficulty of Paying Living Expenses: Not on file  Food Insecurity:   . Worried About Charity fundraiser in the Last Year: Not on file  . Ran Out of Food in the Last Year: Not on file  Transportation Needs:   . Lack of Transportation (Medical): Not on file  . Lack of Transportation (Non-Medical): Not on file  Physical Activity:   . Days of Exercise per Week: Not on file  . Minutes of Exercise per Session: Not on file  Stress:   . Feeling of Stress : Not on file  Social Connections:   . Frequency of Communication with  Friends and Family: Not on file  . Frequency of Social Gatherings with Friends and Family: Not on file  . Attends Religious Services: Not on file  . Active Member of Clubs or Organizations: Not on file  . Attends Archivist Meetings: Not on file  . Marital Status: Not on file  Intimate Partner Violence:   .  Fear of Current or Ex-Partner: Not on file  . Emotionally Abused: Not on file  . Physically Abused: Not on file  . Sexually Abused: Not on file     Current Outpatient Medications:  .  acetaminophen (TYLENOL) 500 MG tablet, Take 1,000 mg by mouth every 6 (six) hours as needed for moderate pain. , Disp: , Rfl:  .  Cholecalciferol (VITAMIN D3 PO), Take 1 tablet by mouth daily., Disp: , Rfl:  .  clopidogrel (PLAVIX) 75 MG tablet, Take 75 mg by mouth daily. , Disp: , Rfl:  .  Coenzyme Q10 (CO Q-10) 100 MG CAPS, Take 100 mg by mouth daily. , Disp: , Rfl:  .  donepezil (ARICEPT) 10 MG tablet, Take 10 mg by mouth at bedtime., Disp: , Rfl:  .  dutasteride (AVODART) 0.5 MG capsule, TAKE 1 CAPSULE BY MOUTH EVERY DAY, Disp: 90 capsule, Rfl: 3 .  EPINEPHrine 0.3 mg/0.3 mL IJ SOAJ injection, Inject 0.3 mg into the muscle as needed for anaphylaxis. , Disp: , Rfl:  .  fluticasone (FLONASE) 50 MCG/ACT nasal spray, TAKE 2 SPRAYS INTO EACH NOSTRIL EVERY DAY, Disp: 16 g, Rfl: 5 .  furosemide (LASIX) 40 MG tablet, Take 40 mg by mouth daily. , Disp: , Rfl:  .  glucosamine-chondroitin 500-400 MG tablet, Take 1 tablet by mouth 3 (three) times daily., Disp: , Rfl:  .  hydrOXYzine (ATARAX/VISTARIL) 10 MG tablet, Take 20 mg by mouth at bedtime. , Disp: , Rfl:  .  ketoconazole (NIZORAL) 2 % shampoo, Apply 1 application topically 2 (two) times a week. Apply to scalp twice weekly leave in for 5 to 10 mins., Disp: 120 mL, Rfl: 11 .  levothyroxine (SYNTHROID, LEVOTHROID) 50 MCG tablet, Take 50 mcg by mouth daily before breakfast. Take on an empty stomach with a glass of water at least 30-60 minutes before  breakfast. , Disp: , Rfl:  .  lidocaine (LIDODERM) 5 %, Place 1 patch onto the skin daily. , Disp: , Rfl:  .  Melatonin 3 MG TABS, Take 1 tablet by mouth at bedtime., Disp: , Rfl:  .  Methen-Hyosc-Meth Blue-Na Phos (UROGESIC-BLUE) 81.6 MG TABS, 1 tab every 8 hours as needed for burning, Disp: 20 tablet, Rfl: 0 .  mirabegron ER (MYRBETRIQ) 25 MG TB24 tablet, Take 25 mg by mouth daily., Disp: , Rfl:  .  phenazopyridine (PYRIDIUM) 100 MG tablet, Take 1 tablet (100 mg total) by mouth 3 (three) times daily as needed for pain., Disp: 10 tablet, Rfl: 0 .  RA KRILL OIL 500 MG CAPS, Take 500 mg by mouth daily. , Disp: , Rfl:  .  tamsulosin (FLOMAX) 0.4 MG CAPS capsule, TAKE 1 CAPSULE BY MOUTH EVERY DAY, Disp: 90 capsule, Rfl: 3 .  tiotropium (SPIRIVA) 18 MCG inhalation capsule, Place 1 capsule (18 mcg total) into inhaler and inhale daily., Disp: 90 capsule, Rfl: 1 .  TOLTERODINE TARTRATE PO, Take 4 mg by mouth daily., Disp: , Rfl:  .  topiramate (TOPAMAX) 50 MG tablet, Take 50 mg by mouth 2 (two) times daily., Disp: , Rfl:  .  Turmeric (QC TUMERIC COMPLEX) 500 MG CAPS, Take by mouth., Disp: , Rfl:  .  vitamin B-12 (CYANOCOBALAMIN) 1000 MCG tablet, Take 1,000 mcg by mouth daily., Disp: , Rfl:  .  ADVAIR HFA 637-85 MCG/ACT inhaler, INHALE TWO PUFFS TWICE A DAY, Disp: 24 g, Rfl: 6 .  albuterol (VENTOLIN HFA) 108 (90 Base) MCG/ACT inhaler, PLACE ONE INHALATION TWICE DAILY, Disp: 54 g, Rfl:  6 .  SYMBICORT 160-4.5 MCG/ACT inhaler, USE 2 PUFFS INTO LUNGS TWICE DAILY, Disp: 20.4 g, Rfl: 6   Allergies  Allergen Reactions  . Asa [Aspirin] Shortness Of Breath  . Bee Venom Anaphylaxis  . Nsaids Other (See Comments)    Makes him bleed.  . Tolmetin Other (See Comments)    Makes him bleed.    ROS Review of Systems  Constitutional: Negative.   HENT: Negative.   Eyes: Negative.   Respiratory: Negative.   Cardiovascular: Positive for leg swelling.  Gastrointestinal: Negative.   Endocrine: Negative.     Genitourinary: Negative.        + incontinence  Musculoskeletal: Negative.   Skin: Negative.   Allergic/Immunologic: Negative.   Neurological: Positive for tremors.  Hematological: Negative.   Psychiatric/Behavioral: Negative.        + Reports difficultly with recent memory loss  All other systems reviewed and are negative.    OBJECTIVE:    Physical Exam Vitals reviewed.  Constitutional:      Appearance: Normal appearance. He is obese.  HENT:     Mouth/Throat:     Mouth: Mucous membranes are moist.  Eyes:     Pupils: Pupils are equal, round, and reactive to light.  Neck:     Vascular: No carotid bruit.  Cardiovascular:     Rate and Rhythm: Normal rate and regular rhythm.     Pulses: Normal pulses.     Heart sounds: Normal heart sounds.  Pulmonary:     Effort: Pulmonary effort is normal.     Breath sounds: Normal breath sounds.  Abdominal:     General: Bowel sounds are normal.     Palpations: Abdomen is soft. There is no hepatomegaly, splenomegaly or mass.     Tenderness: There is no abdominal tenderness.     Hernia: No hernia is present.  Musculoskeletal:     Cervical back: Neck supple.     Right lower leg: 2+ Edema present.     Left lower leg: 1+ Edema present.     Right foot: Swelling present.     Left foot: Swelling present.  Feet:     Right foot:     Skin integrity: Erythema present. No skin breakdown.     Left foot:     Skin integrity: Erythema present. No skin breakdown.  Skin:    Findings: No rash.  Neurological:     Mental Status: He is alert and oriented to person, place, and time.     Motor: No weakness.  Psychiatric:        Mood and Affect: Mood normal.        Behavior: Behavior normal.     BP 125/80   Pulse 82   Ht 6' (1.829 m)   BMI 34.58 kg/m  Wt Readings from Last 3 Encounters:  07/12/20 255 lb (115.7 kg)  07/09/20 255 lb (115.7 kg)  06/25/20 250 lb (113.4 kg)    Health Maintenance Due  Topic Date Due  . Hepatitis C Screening   Never done  . OPHTHALMOLOGY EXAM  Never done  . URINE MICROALBUMIN  Never done  . COVID-19 Vaccine (1) Never done  . TETANUS/TDAP  Never done  . PNA vac Low Risk Adult (1 of 2 - PCV13) Never done  . HEMOGLOBIN A1C  12/01/2017  . INFLUENZA VACCINE  06/23/2020  . FOOT EXAM  07/09/2020    There are no preventive care reminders to display for this patient.  CBC Latest Ref Rng &  Units 07/15/2017 07/13/2017 01/16/2016  WBC 3.8 - 10.6 K/uL 12.0(H) 13.8(H) -  Hemoglobin 13.0 - 18.0 g/dL 13.8 13.6 -  Hematocrit 40 - 52 % 40.6 39.8(L) 44.1  Platelets 150 - 440 K/uL 163 181 -   CMP Latest Ref Rng & Units 06/05/2020 07/13/2017 01/16/2016  Glucose 65 - 99 mg/dL 176(H) 124(H) -  BUN 7 - 25 mg/dL 17 11 -  Creatinine 0.70 - 1.18 mg/dL 1.09 0.87 -  Sodium 135 - 146 mmol/L 139 140 -  Potassium 3.5 - 5.3 mmol/L 3.9 4.0 -  Chloride 98 - 110 mmol/L 102 107 -  CO2 20 - 32 mmol/L 27 26 -  Calcium 8.6 - 10.3 mg/dL 9.0 8.9 -  Total Protein 6.1 - 8.1 g/dL 6.3 6.6 6.4  Total Bilirubin 0.2 - 1.2 mg/dL 0.6 1.0 0.3  Alkaline Phos 38 - 126 U/L - 54 57  AST 10 - 35 U/L 13 20 30   ALT 9 - 46 U/L 11 13(L) 34    Lab Results  Component Value Date   TSH 6.430 (H) 01/16/2016   Lab Results  Component Value Date   ALBUMIN 3.4 (L) 07/13/2017   ANIONGAP 7 07/13/2017   Lab Results  Component Value Date   CHOL 187 01/16/2016   CHOL 159 10/30/2014   CHOL 172 07/26/2013   HDL 26 (L) 01/16/2016   HDL 32 (L) 10/30/2014   HDL 37 (L) 07/26/2013   LDLCALC 110 (H) 01/16/2016   LDLCALC 98 10/30/2014   LDLCALC 85 07/26/2013   CHOLHDL 7.2 (H) 01/16/2016   Lab Results  Component Value Date   TRIG 253 (H) 01/16/2016   Lab Results  Component Value Date   HGBA1C 5.7 (H) 05/31/2017   HGBA1C 6.2 (H) 05/21/2015   HGBA1C 6.9 (H) 11/05/2014      ASSESSMENT & PLAN:   Problem List Items Addressed This Visit      Respiratory   Sleep apnea    Uses CPAP every night, no excessive daytime somnolence         Nervous  and Auditory   Tremor, essential - Primary    Mild tremor, no cogwheel sign. Will continue to monitor.         Musculoskeletal and Integument   Arthritis    Patient with lower back pain per norm, controlled with OTC meds.          No orders of the defined types were placed in this encounter.   Follow-up: Return in about 2 months (around 11/06/2020).    Beckie Salts, [place] [address], [place], [place] [address]   By signing my name below, I, [employer], attest that this documentation has been prepared under the direction and in the presence of Beckie Salts, FNP Electronically Signed: Beckie Salts, [place] 09/26/20, 11:15 AM

## 2020-09-06 NOTE — Assessment & Plan Note (Signed)
Uses CPAP every night, no excessive daytime somnolence

## 2020-09-06 NOTE — Assessment & Plan Note (Signed)
Mild tremor, no cogwheel sign. Will continue to monitor.

## 2020-09-06 NOTE — Assessment & Plan Note (Signed)
Patient with lower back pain per norm, controlled with OTC meds.

## 2020-09-21 ENCOUNTER — Other Ambulatory Visit: Payer: Self-pay | Admitting: Internal Medicine

## 2020-09-25 DIAGNOSIS — M1711 Unilateral primary osteoarthritis, right knee: Secondary | ICD-10-CM | POA: Insufficient documentation

## 2020-09-30 DIAGNOSIS — S83281D Other tear of lateral meniscus, current injury, right knee, subsequent encounter: Secondary | ICD-10-CM | POA: Diagnosis not present

## 2020-09-30 DIAGNOSIS — M170 Bilateral primary osteoarthritis of knee: Secondary | ICD-10-CM | POA: Diagnosis not present

## 2020-10-04 ENCOUNTER — Ambulatory Visit (INDEPENDENT_AMBULATORY_CARE_PROVIDER_SITE_OTHER): Payer: Medicare PPO | Admitting: Family Medicine

## 2020-10-04 ENCOUNTER — Other Ambulatory Visit: Payer: Self-pay

## 2020-10-04 ENCOUNTER — Encounter: Payer: Self-pay | Admitting: Family Medicine

## 2020-10-04 VITALS — BP 138/79 | HR 76 | Ht 69.0 in

## 2020-10-04 DIAGNOSIS — G250 Essential tremor: Secondary | ICD-10-CM

## 2020-10-04 DIAGNOSIS — E1140 Type 2 diabetes mellitus with diabetic neuropathy, unspecified: Secondary | ICD-10-CM

## 2020-10-04 DIAGNOSIS — F0390 Unspecified dementia without behavioral disturbance: Secondary | ICD-10-CM

## 2020-10-04 NOTE — Assessment & Plan Note (Signed)
Dementia controlled with medication, no behavioral disturbances.

## 2020-10-04 NOTE — Progress Notes (Signed)
Established Patient Office Visit  SUBJECTIVE:  Subjective  Patient ID: Eddie Carter, male    DOB: [birth year]  Age: 79 y.o. MRN: [id number]  CC:  Chief Complaint  Patient presents with  . Tremors    patient having trouble holding on to things at times, having episodes of shaking     HPI Eddie Carter is a 79 y.o. male presenting today for tremor and DM control.   Past Medical History:  Diagnosis Date  . Arthritis   . Asthma   . BPH (benign prostatic hyperplasia)   . 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 Complication of anesthesia    after septoplasty and uvulectomy-was in icu- [facility] - 10 yrs. ago  . COPD (chronic obstructive pulmonary disease) ([place])   . Depression   . Diabetes mellitus without complication ([place])   . Diverticulitis   . ED (erectile dysfunction)   . Full dentures   . GERD (gastroesophageal reflux disease)    no meds  . Headache    difficulty with headaches- 1950's , again in 1967, 2008- again problems with migrraines   . History of GI bleed   . History of multiple pulmonary nodules   . History of shingles   . History of urethral stricture   . HOH (hard of hearing)   . HOH (hard of hearing)   . Hypogonadism in male   . IBS (irritable bowel syndrome)   . Joint pain   . Medial meniscus, posterior horn derangement    left knee  . Melanosis coli   . Obesity   . Pneumonia 2014   [facility]  . Shortness of breath   . Skin cancer   . Sleep apnea    uses a cpap, most nights   . Stroke [facility]) 1995   some speech and thought processes slow  . Traumatic tear of lateral meniscus of right knee    right knee  . Urinary frequency   . Urinary incontinence     Past Surgical History:  Procedure Laterality Date  . APPENDECTOMY  1963  . BACK SURGERY    . CARPAL TUNNEL RELEASE     left  . CATARACT EXTRACTION W/PHACO Left 09/13/2018   Procedure: CATARACT EXTRACTION PHACO AND INTRAOCULAR LENS PLACEMENT (IOC);  Surgeon: Birder Robson, MD;  Location:  [facility] ORS;  Service: Ophthalmology;  Laterality: Left;  [place]  00:42 CDE 6.73 Fluid pack lot #[id number]  . CATARACT EXTRACTION W/PHACO Right 11/23/2019   Procedure: CATARACT EXTRACTION PHACO AND INTRAOCULAR LENS PLACEMENT ([place]) RIGHT;  Surgeon: Birder Robson, MD;  Location: [facility] ORS;  Service: Ophthalmology;  Laterality: Right;  [place] 01:10.1 CDE 10.25 Fluid Pack Lot # [id number]  . COLONOSCOPY    . COLONOSCOPY WITH PROPOFOL N/A 12/07/2016   Procedure: COLONOSCOPY WITH PROPOFOL;  Surgeon: Lollie Sails, MD;  Location: [facility] ENDOSCOPY;  Service: Endoscopy;  Laterality: N/A;  . COLONOSCOPY WITH PROPOFOL N/A 06/26/2019   Procedure: COLONOSCOPY WITH PROPOFOL;  Surgeon: Lollie Sails, MD;  Location: [facility] ENDOSCOPY;  Service: Endoscopy;  Laterality: N/A;  . EYE SURGERY     foreign body removed fr. eye, ? side   . FOOT FOREIGN BODY REMOVAL  1976   left foot -multiple pieces glass  . HEMORROIDECTOMY  1995  . KNEE ARTHROSCOPY Bilateral 01/17/2013   Procedure: ARTHROSCOPY KNEE BILATERAL WITH MEDIAL AND LATERAL MENISECTOMIES;  Surgeon: Lorn Junes, MD;  Location: [place];  Service: Orthopedics;  Laterality: Bilateral;  .  KYPHOPLASTY N/A 07/23/2017   Procedure: KYPHOPLASTY- L1;  Surgeon: Hessie Knows, MD;  Location: [facility] ORS;  Service: Orthopedics;  Laterality: N/A;  . LUMBAR LAMINECTOMY N/A 11/05/2014   Procedure: LEFT L3-4 MICRODISCECTOMY;  Surgeon: Jessy Oto, MD;  Location: [place];  Service: Orthopedics;  Laterality: N/A;  . LUMBAR LAMINECTOMY/DECOMPRESSION MICRODISCECTOMY N/A 02/12/2015   Procedure: RE-DO LEFT L3-4 MICRODISCECTOMY;  Surgeon: Jessy Oto, MD;  Location: [place];  Service: Orthopedics;  Laterality: N/A;  . NASAL SEPTUM SURGERY  1995  . TONSILLECTOMY    . UPPER GI ENDOSCOPY    . UVULOPALATOPHARYNGOPLASTY (UPPP)/TONSILLECTOMY/SEPTOPLASTY  1995   same time with septoplasty-ended up ICU almost trached    Family History  Problem Relation Age of Onset  .  Cancer Mother   . Heart attack Father   . Heart disease Father   . Heart disease Other   . Arthritis Other   . Prostate cancer Neg Hx   . Kidney disease Neg Hx     Social History   Socioeconomic History  . Marital status: Married    Spouse name: Mardene Celeste  . Number of children: 2  . Years of education: 30  . Highest education level: Not on file  Occupational History    Comment: register of deeds  Tobacco Use  . Smoking status: Former Smoker    Packs/day: 1.50    Years: 40.00    Pack years: 60.00    Types: Cigarettes    Quit date: 01/14/1996    Years since quitting: 24.7  . Smokeless tobacco: Never Used  Vaping Use  . Vaping Use: Never used  Substance and Sexual Activity  . Alcohol use: No    Comment: wine in a month  . Drug use: No  . Sexual activity: Never  Other Topics Concern  . Not on file  Social History Narrative   Lives at home with wife, son   caffeine use - 2 cups coffee daily, sodas 1 a day, occas tea   Social Determinants of Health   Financial Resource Strain:   . Difficulty of Paying Living Expenses: Not on file  Food Insecurity:   . Worried About Charity fundraiser in the Last Year: Not on file  . Ran Out of Food in the Last Year: Not on file  Transportation Needs:   . Lack of Transportation (Medical): Not on file  . Lack of Transportation (Non-Medical): Not on file  Physical Activity:   . Days of Exercise per Week: Not on file  . Minutes of Exercise per Session: Not on file  Stress:   . Feeling of Stress : Not on file  Social Connections:   . Frequency of Communication with Friends and Family: Not on file  . Frequency of Social Gatherings with Friends and Family: Not on file  . Attends Religious Services: Not on file  . Active Member of Clubs or Organizations: Not on file  . Attends Archivist Meetings: Not on file  . Marital Status: Not on file  Intimate Partner Violence:   . Fear of Current or Ex-Partner: Not on file  .  Emotionally Abused: Not on file  . Physically Abused: Not on file  . Sexually Abused: Not on file     Current Outpatient Medications:  .  acetaminophen (TYLENOL) 500 MG tablet, Take 1,000 mg by mouth every 6 (six) hours as needed for moderate pain. , Disp: , Rfl:  .  ADVAIR HFA 230-21 MCG/ACT inhaler, INHALE TWO PUFFS  TWICE A DAY, Disp: 24 g, Rfl: 6 .  albuterol (VENTOLIN HFA) 108 (90 Base) MCG/ACT inhaler, PLACE ONE INHALATION TWICE DAILY, Disp: 54 g, Rfl: 6 .  Cholecalciferol (VITAMIN D3 PO), Take 1 tablet by mouth daily., Disp: , Rfl:  .  clopidogrel (PLAVIX) 75 MG tablet, Take 75 mg by mouth daily. , Disp: , Rfl:  .  Coenzyme Q10 (CO Q-10) 100 MG CAPS, Take 100 mg by mouth daily. , Disp: , Rfl:  .  donepezil (ARICEPT) 10 MG tablet, Take 10 mg by mouth at bedtime., Disp: , Rfl:  .  dutasteride (AVODART) 0.5 MG capsule, TAKE 1 CAPSULE BY MOUTH EVERY DAY, Disp: 90 capsule, Rfl: 3 .  EPINEPHrine 0.3 mg/0.3 mL IJ SOAJ injection, Inject 0.3 mg into the muscle as needed for anaphylaxis. , Disp: , Rfl:  .  fluticasone (FLONASE) 50 MCG/ACT nasal spray, TAKE 2 SPRAYS INTO EACH NOSTRIL EVERY DAY, Disp: 16 g, Rfl: 5 .  furosemide (LASIX) 40 MG tablet, Take 40 mg by mouth daily. , Disp: , Rfl:  .  glucosamine-chondroitin 500-400 MG tablet, Take 1 tablet by mouth 3 (three) times daily., Disp: , Rfl:  .  hydrOXYzine (ATARAX/VISTARIL) 10 MG tablet, Take 20 mg by mouth at bedtime. , Disp: , Rfl:  .  ketoconazole (NIZORAL) 2 % shampoo, Apply 1 application topically 2 (two) times a week. Apply to scalp twice weekly leave in for 5 to 10 mins., Disp: 120 mL, Rfl: 11 .  levothyroxine (SYNTHROID, LEVOTHROID) 50 MCG tablet, Take 50 mcg by mouth daily before breakfast. Take on an empty stomach with a glass of water at least 30-60 minutes before breakfast. , Disp: , Rfl:  .  lidocaine (LIDODERM) 5 %, Place 1 patch onto the skin daily. , Disp: , Rfl:  .  Melatonin 3 MG TABS, Take 1 tablet by mouth at bedtime., Disp:  , Rfl:  .  Methen-Hyosc-Meth Blue-Na Phos (UROGESIC-BLUE) 81.6 MG TABS, 1 tab every 8 hours as needed for burning, Disp: 20 tablet, Rfl: 0 .  mirabegron ER (MYRBETRIQ) 25 MG TB24 tablet, Take 25 mg by mouth daily., Disp: , Rfl:  .  phenazopyridine (PYRIDIUM) 100 MG tablet, Take 1 tablet (100 mg total) by mouth 3 (three) times daily as needed for pain., Disp: 10 tablet, Rfl: 0 .  RA KRILL OIL 500 MG CAPS, Take 500 mg by mouth daily. , Disp: , Rfl:  .  SYMBICORT 160-4.5 MCG/ACT inhaler, USE 2 PUFFS INTO LUNGS TWICE DAILY, Disp: 20.4 g, Rfl: 6 .  tamsulosin (FLOMAX) 0.4 MG CAPS capsule, TAKE 1 CAPSULE BY MOUTH EVERY DAY, Disp: 90 capsule, Rfl: 3 .  tiotropium (SPIRIVA) 18 MCG inhalation capsule, Place 1 capsule (18 mcg total) into inhaler and inhale daily., Disp: 90 capsule, Rfl: 1 .  TOLTERODINE TARTRATE PO, Take 4 mg by mouth daily., Disp: , Rfl:  .  topiramate (TOPAMAX) 50 MG tablet, Take 50 mg by mouth 2 (two) times daily., Disp: , Rfl:  .  Turmeric (QC TUMERIC COMPLEX) 500 MG CAPS, Take by mouth., Disp: , Rfl:  .  vitamin B-12 (CYANOCOBALAMIN) 1000 MCG tablet, Take 1,000 mcg by mouth daily., Disp: , Rfl:    Allergies  Allergen Reactions  . Asa [Aspirin] Shortness Of Breath  . Bee Venom Anaphylaxis  . Nsaids Other (See Comments)    Makes him bleed.  . Tolmetin Other (See Comments)    Makes him bleed.    ROS Review of Systems  Constitutional: Negative.  HENT: Negative.   Respiratory: Negative.   Cardiovascular: Negative.   Gastrointestinal: Negative.   Musculoskeletal: Positive for gait problem.  Neurological: Positive for tremors.  Psychiatric/Behavioral: Negative.      OBJECTIVE:    Physical Exam Vitals and nursing note reviewed.  Constitutional:      Appearance: He is obese.  HENT:     Head: Normocephalic.     Nose: Nose normal.     Mouth/Throat:     Mouth: Mucous membranes are moist.  Eyes:     Pupils: Pupils are equal, round, and reactive to light.    Cardiovascular:     Rate and Rhythm: Normal rate and regular rhythm.  Pulmonary:     Effort: Pulmonary effort is normal.  Abdominal:     General: Abdomen is flat.  Skin:    Capillary Refill: Capillary refill takes less than 2 seconds.  Neurological:     General: No focal deficit present.     BP 138/79   Pulse 76   Ht 5\' 9"  (1.753 m)   BMI 37.66 kg/m  Wt Readings from Last 3 Encounters:  07/12/20 255 lb (115.7 kg)  07/09/20 255 lb (115.7 kg)  06/25/20 250 lb (113.4 kg)    Health Maintenance Due  Topic Date Due  . Hepatitis C Screening  Never done  . OPHTHALMOLOGY EXAM  Never done  . URINE MICROALBUMIN  Never done  . COVID-19 Vaccine (1) Never done  . TETANUS/TDAP  Never done  . PNA vac Low Risk Adult (1 of 2 - PCV13) Never done  . HEMOGLOBIN A1C  12/01/2017  . INFLUENZA VACCINE  06/23/2020  . FOOT EXAM  07/09/2020    There are no preventive care reminders to display for this patient.  CBC Latest Ref Rng & Units 07/15/2017 07/13/2017 01/16/2016  WBC 3.8 - 10.6 K/uL 12.0(H) 13.8(H) -  Hemoglobin 13.0 - 18.0 g/dL 13.8 13.6 -  Hematocrit 40 - 52 % 40.6 39.8(L) 44.1  Platelets 150 - 440 K/uL 163 181 -   CMP Latest Ref Rng & Units 06/05/2020 07/13/2017 01/16/2016  Glucose 65 - 99 mg/dL 176(H) 124(H) -  BUN 7 - 25 mg/dL 17 11 -  Creatinine 0.70 - 1.18 mg/dL 1.09 0.87 -  Sodium 135 - 146 mmol/L 139 140 -  Potassium 3.5 - 5.3 mmol/L 3.9 4.0 -  Chloride 98 - 110 mmol/L 102 107 -  CO2 20 - 32 mmol/L 27 26 -  Calcium 8.6 - 10.3 mg/dL 9.0 8.9 -  Total Protein 6.1 - 8.1 g/dL 6.3 6.6 6.4  Total Bilirubin 0.2 - 1.2 mg/dL 0.6 1.0 0.3  Alkaline Phos 38 - 126 U/L - 54 57  AST 10 - 35 U/L 13 20 30   ALT 9 - 46 U/L 11 13(L) 34    Lab Results  Component Value Date   TSH 6.430 (H) 01/16/2016   Lab Results  Component Value Date   ALBUMIN 3.4 (L) 07/13/2017   ANIONGAP 7 07/13/2017   Lab Results  Component Value Date   CHOL 187 01/16/2016   CHOL 159 10/30/2014   CHOL 172  07/26/2013   HDL 26 (L) 01/16/2016   HDL 32 (L) 10/30/2014   HDL 37 (L) 07/26/2013   LDLCALC 110 (H) 01/16/2016   LDLCALC 98 10/30/2014   LDLCALC 85 07/26/2013   CHOLHDL 7.2 (H) 01/16/2016   Lab Results  Component Value Date   TRIG 253 (H) 01/16/2016   Lab Results  Component Value Date   HGBA1C  5.7 (H) 05/31/2017   HGBA1C 6.2 (H) 05/21/2015   HGBA1C 6.9 (H) 11/05/2014      ASSESSMENT & PLAN:   Problem List Items Addressed This Visit      Endocrine   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 Discussed foot care.  Neuropathy untreated at this time due to not tolerating gabapentin well, A1C controlled. Pain controlled.          Nervous and Auditory   Dementia without behavioral disturbance ([facility])    Dementia controlled with medication, no behavioral disturbances.       Tremor, essential    Medication Topomax being taken, R hand tremor getting worse per patient, on exam no tremor today.          No orders of the defined types were placed in this encounter.     Follow-up: No follow-ups on file.    Beckie Salts, [place] [address], [place], [place] [address]

## 2020-10-04 NOTE — Assessment & Plan Note (Signed)
Medication Topomax being taken, R hand tremor getting worse per patient, on exam no tremor today.

## 2020-10-04 NOTE — Assessment & Plan Note (Signed)
Diabetes mellitus Type II, under good control.. Discussed foot care.  Neuropathy untreated at this time due to not tolerating gabapentin well, A1C controlled. Pain controlled.

## 2020-10-14 DIAGNOSIS — M1711 Unilateral primary osteoarthritis, right knee: Secondary | ICD-10-CM | POA: Diagnosis not present

## 2020-10-15 ENCOUNTER — Ambulatory Visit (INDEPENDENT_AMBULATORY_CARE_PROVIDER_SITE_OTHER): Payer: Medicare PPO | Admitting: Vascular Surgery

## 2020-10-15 ENCOUNTER — Other Ambulatory Visit: Payer: Self-pay

## 2020-10-15 VITALS — BP 116/72 | HR 72 | Ht 72.0 in | Wt 250.0 lb

## 2020-10-15 DIAGNOSIS — M5441 Lumbago with sciatica, right side: Secondary | ICD-10-CM | POA: Diagnosis not present

## 2020-10-15 DIAGNOSIS — Z87438 Personal history of other diseases of male genital organs: Secondary | ICD-10-CM | POA: Insufficient documentation

## 2020-10-15 DIAGNOSIS — R270 Ataxia, unspecified: Secondary | ICD-10-CM

## 2020-10-15 DIAGNOSIS — N400 Enlarged prostate without lower urinary tract symptoms: Secondary | ICD-10-CM | POA: Insufficient documentation

## 2020-10-15 DIAGNOSIS — R519 Headache, unspecified: Secondary | ICD-10-CM | POA: Insufficient documentation

## 2020-10-15 DIAGNOSIS — G8929 Other chronic pain: Secondary | ICD-10-CM

## 2020-10-15 DIAGNOSIS — E1140 Type 2 diabetes mellitus with diabetic neuropathy, unspecified: Secondary | ICD-10-CM

## 2020-10-15 DIAGNOSIS — I890 Lymphedema, not elsewhere classified: Secondary | ICD-10-CM | POA: Insufficient documentation

## 2020-10-15 NOTE — Assessment & Plan Note (Signed)
His leg swelling is actually doing a little bit better with increasing his activity and wear elevation.  He is not using a pump or compression socks.  No new ulcerations or wounds.  Discussed the importance of increasing activity, elevation, and if he can do some compression that would be good as well.  Return to clinic in 6 months for follow-up.

## 2020-10-15 NOTE — Progress Notes (Signed)
MRN : [id number]  Eddie Carter is a 79 y.o. (1941-01-10) male who presents with chief complaint of  Chief Complaint  Patient presents with  . Follow-up    41Mo no studies  .  History of Present Illness: Patient returns today in follow up of his leg swelling.  His legs are less swollen than they were at his last couple of visits.  He says he has been elevating the more and he is trying to walk more with a walker.  He is in a motorized wheelchair today.  He is not using a pump or compression socks at this point.  No new ulcerations or infection.  No fevers or chills.  Current Outpatient Medications  Medication Sig Dispense Refill  . acetaminophen (TYLENOL) 500 MG tablet Take 1,000 mg by mouth every 6 (six) hours as needed for moderate pain.     Marland Kitchen ADVAIR HFA 230-21 MCG/ACT inhaler INHALE TWO PUFFS TWICE A DAY 24 g 6  . albuterol (VENTOLIN HFA) 108 (90 Base) MCG/ACT inhaler PLACE ONE INHALATION TWICE DAILY 54 g 6  . Cholecalciferol (VITAMIN D3 PO) Take 1 tablet by mouth daily.    . clopidogrel (PLAVIX) 75 MG tablet Take 75 mg by mouth daily.     . Coenzyme Q10 (CO Q-10) 100 MG CAPS Take 100 mg by mouth daily.     Marland Kitchen donepezil (ARICEPT) 10 MG tablet Take 10 mg by mouth at bedtime.    . dutasteride (AVODART) 0.5 MG capsule TAKE 1 CAPSULE BY MOUTH EVERY DAY 90 capsule 3  . EPINEPHrine 0.3 mg/0.3 mL IJ SOAJ injection Inject 0.3 mg into the muscle as needed for anaphylaxis.     . fluticasone (FLONASE) 50 MCG/ACT nasal spray TAKE 2 SPRAYS INTO EACH NOSTRIL EVERY DAY 16 g 5  . furosemide (LASIX) 40 MG tablet Take 40 mg by mouth daily.     Marland Kitchen glucosamine-chondroitin 500-400 MG tablet Take 1 tablet by mouth 3 (three) times daily.    . hydrOXYzine (ATARAX/VISTARIL) 10 MG tablet Take 20 mg by mouth at bedtime.     Marland Kitchen ketoconazole (NIZORAL) 2 % shampoo Apply 1 application topically 2 (two) times a week. Apply to scalp twice weekly leave in for 5 to 10 mins. 120 mL 11  . levothyroxine (SYNTHROID,  LEVOTHROID) 50 MCG tablet Take 50 mcg by mouth daily before breakfast. Take on an empty stomach with a glass of water at least 30-60 minutes before breakfast.     . lidocaine (LIDODERM) 5 % Place 1 patch onto the skin daily.     . Melatonin 3 MG TABS Take 1 tablet by mouth at bedtime.    . Methen-Hyosc-Meth Blue-Na Phos (UROGESIC-BLUE) 81.6 MG TABS 1 tab every 8 hours as needed for burning 20 tablet 0  . mirabegron ER (MYRBETRIQ) 25 MG TB24 tablet Take 25 mg by mouth daily.    . phenazopyridine (PYRIDIUM) 100 MG tablet Take 1 tablet (100 mg total) by mouth 3 (three) times daily as needed for pain. 10 tablet 0  . RA KRILL OIL 500 MG CAPS Take 500 mg by mouth daily.     . SYMBICORT 160-4.5 MCG/ACT inhaler USE 2 PUFFS INTO LUNGS TWICE DAILY 20.4 g 6  . tamsulosin (FLOMAX) 0.4 MG CAPS capsule TAKE 1 CAPSULE BY MOUTH EVERY DAY 90 capsule 3  . tiotropium (SPIRIVA) 18 MCG inhalation capsule Place 1 capsule (18 mcg total) into inhaler and inhale daily. 90 capsule 1  . TOLTERODINE TARTRATE PO Take  4 mg by mouth daily.    Marland Kitchen topiramate (TOPAMAX) 50 MG tablet Take 50 mg by mouth 2 (two) times daily.    . TOVIAZ 8 MG TB24 tablet Take 8 mg by mouth daily.    . Turmeric (QC TUMERIC COMPLEX) 500 MG CAPS Take by mouth.    . vitamin B-12 (CYANOCOBALAMIN) 1000 MCG tablet Take 1,000 mcg by mouth daily.     No current facility-administered medications for this visit.    Past Medical History:  Diagnosis Date  . Arthritis   . Asthma   . BPH (benign prostatic hyperplasia)   . 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 Complication of anesthesia    after septoplasty and uvulectomy-was in icu- [facility] - 10 yrs. ago  . COPD (chronic obstructive pulmonary disease) ([place])   . Depression   . Diabetes mellitus without complication ([place])   . Diverticulitis   . ED (erectile dysfunction)   . Full dentures   . GERD (gastroesophageal reflux disease)    no meds  . Headache    difficulty with headaches- 1950's ,  again in 1967, 2008- again problems with migrraines   . History of GI bleed   . History of multiple pulmonary nodules   . History of shingles   . History of urethral stricture   . HOH (hard of hearing)   . HOH (hard of hearing)   . Hypogonadism in male   . IBS (irritable bowel syndrome)   . Joint pain   . Medial meniscus, posterior horn derangement    left knee  . Melanosis coli   . Obesity   . Pneumonia 2014   [facility]  . Shortness of breath   . Skin cancer   . Sleep apnea    uses a cpap, most nights   . Stroke [facility]) 1995   some speech and thought processes slow  . Traumatic tear of lateral meniscus of right knee    right knee  . Urinary frequency   . Urinary incontinence     Past Surgical History:  Procedure Laterality Date  . APPENDECTOMY  1963  . BACK SURGERY    . CARPAL TUNNEL RELEASE     left  . CATARACT EXTRACTION W/PHACO Left 09/13/2018   Procedure: CATARACT EXTRACTION PHACO AND INTRAOCULAR LENS PLACEMENT (IOC);  Surgeon: Birder Robson, MD;  Location: [facility] ORS;  Service: Ophthalmology;  Laterality: Left;  [place]  00:42 CDE 6.73 Fluid pack lot #[id number]  . CATARACT EXTRACTION W/PHACO Right 11/23/2019   Procedure: CATARACT EXTRACTION PHACO AND INTRAOCULAR LENS PLACEMENT ([place]) RIGHT;  Surgeon: Birder Robson, MD;  Location: [facility] ORS;  Service: Ophthalmology;  Laterality: Right;  [place] 01:10.1 CDE 10.25 Fluid Pack Lot # [id number]  . COLONOSCOPY    . COLONOSCOPY WITH PROPOFOL N/A 12/07/2016   Procedure: COLONOSCOPY WITH PROPOFOL;  Surgeon: Lollie Sails, MD;  Location: [facility] ENDOSCOPY;  Service: Endoscopy;  Laterality: N/A;  . COLONOSCOPY WITH PROPOFOL N/A 06/26/2019   Procedure: COLONOSCOPY WITH PROPOFOL;  Surgeon: Lollie Sails, MD;  Location: [facility] ENDOSCOPY;  Service: Endoscopy;  Laterality: N/A;  . EYE SURGERY     foreign body removed fr. eye, ? side   . FOOT FOREIGN BODY REMOVAL  1976   left foot -multiple pieces glass  . HEMORROIDECTOMY  1995  . KNEE  ARTHROSCOPY Bilateral 01/17/2013   Procedure: ARTHROSCOPY KNEE BILATERAL WITH MEDIAL AND LATERAL MENISECTOMIES;  Surgeon: Lorn Junes, MD;  Location: [place];  Service: Orthopedics;  Laterality: Bilateral;  . KYPHOPLASTY N/A 07/23/2017   Procedure: KYPHOPLASTY- L1;  Surgeon: Hessie Knows, MD;  Location: [facility] ORS;  Service: Orthopedics;  Laterality: N/A;  . LUMBAR LAMINECTOMY N/A 11/05/2014   Procedure: LEFT L3-4 MICRODISCECTOMY;  Surgeon: Jessy Oto, MD;  Location: [place];  Service: Orthopedics;  Laterality: N/A;  . LUMBAR LAMINECTOMY/DECOMPRESSION MICRODISCECTOMY N/A 02/12/2015   Procedure: RE-DO LEFT L3-4 MICRODISCECTOMY;  Surgeon: Jessy Oto, MD;  Location: [place];  Service: Orthopedics;  Laterality: N/A;  . NASAL SEPTUM SURGERY  1995  . TONSILLECTOMY    . UPPER GI ENDOSCOPY    . UVULOPALATOPHARYNGOPLASTY (UPPP)/TONSILLECTOMY/SEPTOPLASTY  1995   same time with septoplasty-ended up ICU almost trached     Social History   Tobacco Use  . Smoking status: Former Smoker    Packs/day: 1.50    Years: 40.00    Pack years: 60.00    Types: Cigarettes    Quit date: 01/14/1996    Years since quitting: 24.7  . Smokeless tobacco: Never Used  Vaping Use  . Vaping Use: Never used  Substance Use Topics  . Alcohol use: No    Comment: wine in a month  . Drug use: No      Family History  Problem Relation Age of Onset  . Cancer Mother   . Heart attack Father   . Heart disease Father   . Heart disease Other   . Arthritis Other   . Prostate cancer Neg Hx   . Kidney disease Neg Hx      Allergies  Allergen Reactions  . Asa [Aspirin] Shortness Of Breath  . Bee Venom Anaphylaxis  . Nsaids Other (See Comments)    Makes him bleed.  . Tolmetin Other (See Comments)    Makes him bleed.    REVIEW OF SYSTEMS (Negative unless checked)  Constitutional: [] ?Weight loss  [] ?Fever  [] ?Chills Cardiac: [] ?Chest pain   [] ?Chest pressure   [] ?Palpitations   [] ?Shortness of  breath when laying flat   [] ?Shortness of breath at rest   [] ?Shortness of breath with exertion. Vascular:  [] ?Pain in legs with walking   [] ?Pain in legs at rest   [] ?Pain in legs when laying flat   [] ?Claudication   [] ?Pain in feet when walking  [] ?Pain in feet at rest  [] ?Pain in feet when laying flat   [] ?History of DVT   [] ?Phlebitis   [x] ?Swelling in legs   [] ?Varicose veins   [] ?Non-healing ulcers Pulmonary:   [] ?Uses home oxygen   [] ?Productive cough   [] ?Hemoptysis   [] ?Wheeze  [] ?COPD   [] ?Asthma Neurologic:  [x] ?Dizziness  [] ?Blackouts   [] ?Seizures   [] ?History of stroke   [] ?History of TIA  [] ?Aphasia   [] ?Temporary blindness   [] ?Dysphagia   [x] ?Weakness or numbness in arms   [x] ?Weakness or numbness in legs Musculoskeletal:  [x] ?Arthritis   [] ?Joint swelling   [x] ?Joint pain   [] ?Low back pain Hematologic:  [] ?Easy bruising  [] ?Easy bleeding   [] ?Hypercoagulable state   [] ?Anemic  [] ?Hepatitis Gastrointestinal:  [] ?Blood in stool   [] ?Vomiting blood  [] ?Gastroesophageal reflux/heartburn   [] ?Abdominal pain Genitourinary:  [] ?Chronic kidney disease   [] ?Difficult urination  [] ?Frequent urination  [] ?Burning with urination   [] ?Hematuria Skin:  [] ?Rashes   [] ?Ulcers   [] ?Wounds Psychological:  [] ?History of anxiety   [] ? History of major depression.  Physical Examination  BP 116/72   Pulse 72   Ht 6' (1.829 m)   Wt 250 lb (113.4 kg)  BMI 33.91 kg/m  Gen:  WD/WN, NAD Head: [place]/AT, No temporalis wasting. Ear/Nose/Throat: Hearing grossly intact, nares w/o erythema or drainage Eyes: Conjunctiva clear. Sclera non-icteric Neck: Supple.  Trachea midline Pulmonary:  Good air movement, no use of accessory muscles.  Cardiac: Irregular Vascular:  Vessel Right Left  Radial Palpable Palpable           Musculoskeletal: Using a motorized wheelchair.  No deformity or atrophy.  Moderate stasis dermatitis changes present on the right, mild to moderate stasis dermatitis changes present on  the left.  1+ bilateral lower extremity edema. Neurologic: Sensation grossly intact in extremities.  Symmetrical.  Speech is fluent.  Psychiatric: Judgment intact, Mood & affect appropriate for pt's clinical situation. Dermatologic: No rashes or ulcers noted.  No cellulitis or open wounds.      Labs No results found for this or any previous visit (from the past 2160 hour(s)).  Radiology No results found.  Assessment/Plan Diabetes mellitus ([place]) blood glucose control important in reducing the progression of atherosclerotic disease. Also, involved in wound healing. On appropriate medications.   Low back pain Sounds like he is getting epidural injections.  His mobility is extremely limited which markedly worsens his lower extremity swelling.  Ataxia Balance is very poor.  He walks very little at this point and has frequent falls.  Lymphedema His leg swelling is actually doing a little bit better with increasing his activity and wear elevation.  He is not using a pump or compression socks.  No new ulcerations or wounds.  Discussed the importance of increasing activity, elevation, and if he can do some compression that would be good as well.  Return to clinic in 6 months for follow-up.    Leotis Pain, MD  10/15/2020 11:52 AM    This note was created with Dragon medical transcription system.  Any errors from dictation are purely unintentional

## 2020-10-21 DIAGNOSIS — M1711 Unilateral primary osteoarthritis, right knee: Secondary | ICD-10-CM | POA: Diagnosis not present

## 2020-10-23 DIAGNOSIS — M545 Low back pain, unspecified: Secondary | ICD-10-CM | POA: Diagnosis not present

## 2020-10-23 DIAGNOSIS — G8929 Other chronic pain: Secondary | ICD-10-CM | POA: Diagnosis not present

## 2020-10-23 DIAGNOSIS — E119 Type 2 diabetes mellitus without complications: Secondary | ICD-10-CM | POA: Diagnosis not present

## 2020-10-23 DIAGNOSIS — M5459 Other low back pain: Secondary | ICD-10-CM | POA: Diagnosis not present

## 2020-11-05 DIAGNOSIS — M461 Sacroiliitis, not elsewhere classified: Secondary | ICD-10-CM | POA: Diagnosis not present

## 2020-11-05 DIAGNOSIS — M48061 Spinal stenosis, lumbar region without neurogenic claudication: Secondary | ICD-10-CM | POA: Diagnosis not present

## 2020-11-05 DIAGNOSIS — G8929 Other chronic pain: Secondary | ICD-10-CM | POA: Diagnosis not present

## 2020-11-05 DIAGNOSIS — E088 Diabetes mellitus due to underlying condition with unspecified complications: Secondary | ICD-10-CM | POA: Diagnosis not present

## 2020-11-05 DIAGNOSIS — M47816 Spondylosis without myelopathy or radiculopathy, lumbar region: Secondary | ICD-10-CM | POA: Diagnosis not present

## 2020-11-05 DIAGNOSIS — M25511 Pain in right shoulder: Secondary | ICD-10-CM | POA: Diagnosis not present

## 2020-11-05 DIAGNOSIS — M545 Low back pain, unspecified: Secondary | ICD-10-CM | POA: Diagnosis not present

## 2020-11-05 DIAGNOSIS — M25561 Pain in right knee: Secondary | ICD-10-CM | POA: Diagnosis not present

## 2020-11-05 DIAGNOSIS — M1711 Unilateral primary osteoarthritis, right knee: Secondary | ICD-10-CM | POA: Diagnosis not present

## 2020-11-13 ENCOUNTER — Other Ambulatory Visit: Payer: Self-pay | Admitting: Internal Medicine

## 2020-11-27 DIAGNOSIS — M19011 Primary osteoarthritis, right shoulder: Secondary | ICD-10-CM | POA: Diagnosis not present

## 2020-11-27 DIAGNOSIS — M25511 Pain in right shoulder: Secondary | ICD-10-CM | POA: Diagnosis not present

## 2020-11-27 DIAGNOSIS — S46011D Strain of muscle(s) and tendon(s) of the rotator cuff of right shoulder, subsequent encounter: Secondary | ICD-10-CM | POA: Diagnosis not present

## 2020-11-27 DIAGNOSIS — M12811 Other specific arthropathies, not elsewhere classified, right shoulder: Secondary | ICD-10-CM | POA: Diagnosis not present

## 2020-12-02 ENCOUNTER — Other Ambulatory Visit: Payer: Self-pay

## 2020-12-02 ENCOUNTER — Other Ambulatory Visit (INDEPENDENT_AMBULATORY_CARE_PROVIDER_SITE_OTHER): Payer: Medicare PPO | Admitting: Internal Medicine

## 2020-12-02 VITALS — BP 126/65 | HR 72 | Ht 72.0 in | Wt 260.0 lb

## 2020-12-02 DIAGNOSIS — J069 Acute upper respiratory infection, unspecified: Secondary | ICD-10-CM | POA: Diagnosis not present

## 2020-12-02 DIAGNOSIS — G8929 Other chronic pain: Secondary | ICD-10-CM

## 2020-12-02 DIAGNOSIS — E781 Pure hyperglyceridemia: Secondary | ICD-10-CM

## 2020-12-02 DIAGNOSIS — M79606 Pain in leg, unspecified: Secondary | ICD-10-CM | POA: Diagnosis not present

## 2020-12-02 DIAGNOSIS — E1140 Type 2 diabetes mellitus with diabetic neuropathy, unspecified: Secondary | ICD-10-CM

## 2020-12-02 DIAGNOSIS — G44039 Episodic paroxysmal hemicrania, not intractable: Secondary | ICD-10-CM

## 2020-12-02 DIAGNOSIS — G250 Essential tremor: Secondary | ICD-10-CM

## 2020-12-02 LAB — POC COVID19 BINAXNOW: SARS Coronavirus 2 Ag: NEGATIVE

## 2020-12-02 MED ORDER — AZITHROMYCIN 250 MG PO TABS: ORAL_TABLET | ORAL | 0 refills | Status: DC

## 2020-12-02 NOTE — Assessment & Plan Note (Signed)
Tremors are stable at the present time patient however is wheelchair-bound

## 2020-12-02 NOTE — Assessment & Plan Note (Signed)
Patient was advised to follow low-cholesterol low triglyceride diet.  Diet has been explained to him before.  He has lost some weight.

## 2020-12-02 NOTE — Assessment & Plan Note (Signed)
Patient is a lot of stress and headache.  His Covid test was negative.

## 2020-12-02 NOTE — Assessment & Plan Note (Signed)
Patient has a chronic pain in the both legs and both legs.  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  He is wheelchair-bound.

## 2020-12-02 NOTE — Assessment & Plan Note (Signed)
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  He has seen the back surgeon as well as a pain specialist in the past.

## 2020-12-02 NOTE — Progress Notes (Signed)
Established Patient Office Visit  Subjective:  Patient ID: Eddie Carter, male    DOB: [birth year]  Age: 80 y.o. MRN: [id number]  CC: No chief complaint on file.   HPI  Eddie Carter presents for uri/patient complaining of headache no nausea or vomiting.  Covid test was done for him in the office which was negative.  He denies any history of fever or chills.  Swelling of the leg has decreased.  Patient has been seen by a vascular surgeon but he is not he is using his pump for edema.  Past Medical History:  Diagnosis Date  . Arthritis   . Asthma   . BPH (benign prostatic hyperplasia)   . 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 Complication of anesthesia    after septoplasty and uvulectomy-was in icu- [facility] - 10 yrs. ago  . COPD (chronic obstructive pulmonary disease) ([place])   . Depression   . Diabetes mellitus without complication ([place])   . Diverticulitis   . ED (erectile dysfunction)   . Full dentures   . GERD (gastroesophageal reflux disease)    no meds  . Headache    difficulty with headaches- 1950's , again in 1967, 2008- again problems with migrraines   . History of GI bleed   . History of multiple pulmonary nodules   . History of shingles   . History of urethral stricture   . HOH (hard of hearing)   . HOH (hard of hearing)   . Hypogonadism in male   . IBS (irritable bowel syndrome)   . Joint pain   . Medial meniscus, posterior horn derangement    left knee  . Melanosis coli   . Obesity   . Pneumonia 2014   [facility]  . Shortness of breath   . Skin cancer   . Sleep apnea    uses a cpap, most nights   . Stroke [facility] 1995   some speech and thought processes slow  . Traumatic tear of lateral meniscus of right knee    right knee  . Urinary frequency   . Urinary incontinence     Past Surgical History:  Procedure Laterality Date  . APPENDECTOMY  1963  . BACK SURGERY    . CARPAL TUNNEL RELEASE     left  . CATARACT EXTRACTION W/PHACO Left 09/13/2018    Procedure: CATARACT EXTRACTION PHACO AND INTRAOCULAR LENS PLACEMENT (IOC);  Surgeon: Birder Robson, MD;  Location: [facility] ORS;  Service: Ophthalmology;  Laterality: Left;  [place]  00:42 CDE 6.73 Fluid pack lot #@ [id number]  . CATARACT EXTRACTION W/PHACO Right 11/23/2019   Procedure: CATARACT EXTRACTION PHACO AND INTRAOCULAR LENS PLACEMENT ([place]) RIGHT;  Surgeon: Birder Robson, MD;  Location: [facility] ORS;  Service: Ophthalmology;  Laterality: Right;  [place] 01:10.1 CDE 10.25 Fluid Pack Lot # [id number]  . COLONOSCOPY    . COLONOSCOPY WITH PROPOFOL N/A 12/07/2016   Procedure: COLONOSCOPY WITH PROPOFOL;  Surgeon: Lollie Sails, MD;  Location: [facility] ENDOSCOPY;  Service: Endoscopy;  Laterality: N/A;  . COLONOSCOPY WITH PROPOFOL N/A 06/26/2019   Procedure: COLONOSCOPY WITH PROPOFOL;  Surgeon: Lollie Sails, MD;  Location: [facility] ENDOSCOPY;  Service: Endoscopy;  Laterality: N/A;  . EYE SURGERY     foreign body removed fr. eye, ? side   . FOOT FOREIGN BODY REMOVAL  1976   left foot -multiple pieces glass  . HEMORROIDECTOMY  1995  . KNEE ARTHROSCOPY Bilateral 01/17/2013   Procedure: ARTHROSCOPY KNEE BILATERAL WITH MEDIAL  AND LATERAL MENISECTOMIES;  Surgeon: Lorn Junes, MD;  Location: [place];  Service: Orthopedics;  Laterality: Bilateral;  . KYPHOPLASTY N/A 07/23/2017   Procedure: KYPHOPLASTY- L1;  Surgeon: Hessie Knows, MD;  Location: [facility] ORS;  Service: Orthopedics;  Laterality: N/A;  . LUMBAR LAMINECTOMY N/A 11/05/2014   Procedure: LEFT L3-4 MICRODISCECTOMY;  Surgeon: Jessy Oto, MD;  Location: [place];  Service: Orthopedics;  Laterality: N/A;  . LUMBAR LAMINECTOMY/DECOMPRESSION MICRODISCECTOMY N/A 02/12/2015   Procedure: RE-DO LEFT L3-4 MICRODISCECTOMY;  Surgeon: Jessy Oto, MD;  Location: [place];  Service: Orthopedics;  Laterality: N/A;  . NASAL SEPTUM SURGERY  1995  . TONSILLECTOMY    . UPPER GI ENDOSCOPY    . UVULOPALATOPHARYNGOPLASTY (UPPP)/TONSILLECTOMY/SEPTOPLASTY  1995    same time with septoplasty-ended up ICU almost trached    Family History  Problem Relation Age of Onset  . Cancer Mother   . Heart attack Father   . Heart disease Father   . Heart disease Other   . Arthritis Other   . Prostate cancer Neg Hx   . Kidney disease Neg Hx     Social History   Socioeconomic History  . Marital status: Married    Spouse name: Mardene Celeste  . Number of children: 2  . Years of education: 9  . Highest education level: Not on file  Occupational History    Comment: register of deeds  Tobacco Use  . Smoking status: Former Smoker    Packs/day: 1.50    Years: 40.00    Pack years: 60.00    Types: Cigarettes    Quit date: 01/14/1996    Years since quitting: 24.9  . Smokeless tobacco: Never Used  Vaping Use  . Vaping Use: Never used  Substance and Sexual Activity  . Alcohol use: No    Comment: wine in a month  . Drug use: No  . Sexual activity: Never  Other Topics Concern  . Not on file  Social History Narrative   Lives at home with wife, son   caffeine use - 2 cups coffee daily, sodas 1 a day, occas tea   Social Determinants of Health   Financial Resource Strain: Not on file  Food Insecurity: Not on file  Transportation Needs: Not on file  Physical Activity: Not on file  Stress: Not on file  Social Connections: Not on file  Intimate Partner Violence: Not on file     Current Outpatient Medications:  .  azithromycin (ZITHROMAX) 250 MG tablet, 2 tab po daily  For 3 days, Disp: 6 tablet, Rfl: 0 .  acetaminophen (TYLENOL) 500 MG tablet, Take 1,000 mg by mouth every 6 (six) hours as needed for moderate pain. , Disp: , Rfl:  .  ADVAIR HFA [id number] MCG/ACT inhaler, INHALE TWO PUFFS TWICE A DAY, Disp: 24 g, Rfl: 6 .  albuterol (VENTOLIN HFA) 108 (90 Base) MCG/ACT inhaler, PLACE ONE INHALATION TWICE DAILY, Disp: 54 g, Rfl: 6 .  Cholecalciferol (VITAMIN D3 PO), Take 1 tablet by mouth daily., Disp: , Rfl:  .  clopidogrel (PLAVIX) 75 MG tablet, Take 75  mg by mouth daily. , Disp: , Rfl:  .  Coenzyme Q10 (CO Q-10) 100 MG CAPS, Take 100 mg by mouth daily. , Disp: , Rfl:  .  donepezil (ARICEPT) 10 MG tablet, Take 10 mg by mouth at bedtime., Disp: , Rfl:  .  dutasteride (AVODART) 0.5 MG capsule, TAKE 1 CAPSULE BY MOUTH EVERY DAY, Disp: 90 capsule, Rfl: 3 .  EPINEPHrine 0.3 mg/0.3 mL IJ SOAJ injection, Inject 0.3 mg into the muscle as needed for anaphylaxis. , Disp: , Rfl:  .  fluticasone (FLONASE) 50 MCG/ACT nasal spray, TAKE 2 SPRAYS INTO EACH NOSTRIL EVERY DAY, Disp: 16 g, Rfl: 5 .  furosemide (LASIX) 40 MG tablet, TAKE ONE TABLET BY MOUTH EVERY DAY, Disp: 90 tablet, Rfl: 1 .  glucosamine-chondroitin 500-400 MG tablet, Take 1 tablet by mouth 3 (three) times daily., Disp: , Rfl:  .  hydrOXYzine (ATARAX/VISTARIL) 10 MG tablet, Take 20 mg by mouth at bedtime. , Disp: , Rfl:  .  ketoconazole (NIZORAL) 2 % shampoo, Apply 1 application topically 2 (two) times a week. Apply to scalp twice weekly leave in for 5 to 10 mins., Disp: 120 mL, Rfl: 11 .  levothyroxine (SYNTHROID, LEVOTHROID) 50 MCG tablet, Take 50 mcg by mouth daily before breakfast. Take on an empty stomach with a glass of water at least 30-60 minutes before breakfast. , Disp: , Rfl:  .  lidocaine (LIDODERM) 5 %, Place 1 patch onto the skin daily. , Disp: , Rfl:  .  Melatonin 3 MG TABS, Take 1 tablet by mouth at bedtime., Disp: , Rfl:  .  Methen-Hyosc-Meth Blue-Na Phos (UROGESIC-BLUE) 81.6 MG TABS, 1 tab every 8 hours as needed for burning, Disp: 20 tablet, Rfl: 0 .  mirabegron ER (MYRBETRIQ) 25 MG TB24 tablet, Take 25 mg by mouth daily., Disp: , Rfl:  .  phenazopyridine (PYRIDIUM) 100 MG tablet, Take 1 tablet (100 mg total) by mouth 3 (three) times daily as needed for pain., Disp: 10 tablet, Rfl: 0 .  RA KRILL OIL 500 MG CAPS, Take 500 mg by mouth daily. , Disp: , Rfl:  .  SYMBICORT 160-4.5 MCG/ACT inhaler, USE 2 PUFFS INTO LUNGS TWICE DAILY, Disp: 20.4 g, Rfl: 6 .  tamsulosin (FLOMAX) 0.4 MG  CAPS capsule, TAKE 1 CAPSULE BY MOUTH EVERY DAY, Disp: 90 capsule, Rfl: 3 .  tiotropium (SPIRIVA) 18 MCG inhalation capsule, Place 1 capsule (18 mcg total) into inhaler and inhale daily., Disp: 90 capsule, Rfl: 1 .  TOLTERODINE TARTRATE PO, Take 4 mg by mouth daily., Disp: , Rfl:  .  topiramate (TOPAMAX) 50 MG tablet, Take 50 mg by mouth 2 (two) times daily., Disp: , Rfl:  .  TOVIAZ 8 MG TB24 tablet, Take 8 mg by mouth daily., Disp: , Rfl:  .  Turmeric (QC TUMERIC COMPLEX) 500 MG CAPS, Take by mouth., Disp: , Rfl:  .  vitamin B-12 (CYANOCOBALAMIN) 1000 MCG tablet, Take 1,000 mcg by mouth daily., Disp: , Rfl:    Allergies  Allergen Reactions  . Asa [Aspirin] Shortness Of Breath  . Bee Venom Anaphylaxis  . Nsaids Other (See Comments)    Makes him bleed.  . Tolmetin Other (See Comments)    Makes him bleed.    ROS Review of Systems  Constitutional: Negative.  Negative for chills, diaphoresis and fatigue.  HENT: Positive for congestion, sinus pressure and sore throat.   Eyes: Negative.   Respiratory: Negative.  Negative for apnea and shortness of breath.   Cardiovascular: Negative.   Gastrointestinal: Negative.   Endocrine: Negative.   Genitourinary: Negative.  Negative for dysuria.  Musculoskeletal: Positive for arthralgias, back pain, gait problem and joint swelling.  Skin: Negative.   Allergic/Immunologic: Negative.   Neurological: Negative for light-headedness.  Hematological: Negative.   Psychiatric/Behavioral: Negative.   All other systems reviewed and are negative.     Objective:    Physical Exam Vitals  reviewed.  Constitutional:      Appearance: Normal appearance.  HENT:     Mouth/Throat:     Mouth: Mucous membranes are moist.  Eyes:     Pupils: Pupils are equal, round, and reactive to light.  Neck:     Vascular: No carotid bruit.  Cardiovascular:     Rate and Rhythm: Normal rate and regular rhythm.     Pulses: Normal pulses.     Heart sounds: Normal heart  sounds.  Pulmonary:     Effort: Pulmonary effort is normal.     Breath sounds: Normal breath sounds.  Abdominal:     General: Bowel sounds are normal.     Palpations: Abdomen is soft. There is no hepatomegaly, splenomegaly or mass.     Tenderness: There is no abdominal tenderness.     Hernia: No hernia is present.  Musculoskeletal:     Cervical back: Neck supple.     Right lower leg: No edema.     Left lower leg: No edema.     Comments: Patient is wheelchair-bound.  He has swelling of the both knees.  He has trouble walking because of the back pain has arthritis of the left both knees.  At the present time he complains of sore throat and he was given Z-Pak as directed  Skin:    Findings: No rash.  Neurological:     Mental Status: He is alert and oriented to person, place, and time.     Motor: No weakness.  Psychiatric:        Mood and Affect: Mood normal.        Behavior: Behavior normal.     There were no vitals taken for this visit. Wt Readings from Last 3 Encounters:  10/15/20 250 lb (113.4 kg)  07/12/20 255 lb (115.7 kg)  07/09/20 255 lb (115.7 kg)     Health Maintenance Due  Topic Date Due  . Hepatitis C Screening  Never done  . OPHTHALMOLOGY EXAM  Never done  . URINE MICROALBUMIN  Never done  . COVID-19 Vaccine (1) Never done  . TETANUS/TDAP  Never done  . PNA vac Low Risk Adult (1 of 2 - PCV13) Never done  . HEMOGLOBIN A1C  12/01/2017  . INFLUENZA VACCINE  06/23/2020  . FOOT EXAM  07/09/2020    There are no preventive care reminders to display for this patient.  Lab Results  Component Value Date   TSH 6.430 (H) 01/16/2016   Lab Results  Component Value Date   WBC 12.0 (H) 07/15/2017   HGB 13.8 07/15/2017   HCT 40.6 07/15/2017   MCV 91.9 07/15/2017   PLT 163 07/15/2017   Lab Results  Component Value Date   NA 139 06/05/2020   K 3.9 06/05/2020   CO2 27 06/05/2020   GLUCOSE 176 (H) 06/05/2020   BUN 17 06/05/2020   CREATININE 1.09 06/05/2020    BILITOT 0.6 06/05/2020   ALKPHOS 54 07/13/2017   AST 13 06/05/2020   ALT 11 06/05/2020   PROT 6.3 06/05/2020   ALBUMIN 3.4 (L) 07/13/2017   CALCIUM 9.0 06/05/2020   ANIONGAP 7 07/13/2017   Lab Results  Component Value Date   CHOL 187 01/16/2016   Lab Results  Component Value Date   HDL 26 (L) 01/16/2016   Lab Results  Component Value Date   LDLCALC 110 (H) 01/16/2016   Lab Results  Component Value Date   TRIG 253 (H) 01/16/2016   Lab Results  Component  Value Date   CHOLHDL 7.2 (H) 01/16/2016   Lab Results  Component Value Date   HGBA1C 5.7 (H) 05/31/2017      Assessment & Plan:   Problem List Items Addressed This Visit      Respiratory   Viral upper respiratory tract infection - Primary    Patient was started on Z-Pak as directed.      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  - I encouraged the patient to monitor diet. I encouraged the patient to eat low-carb and low-sugar to help prevent blood sugar spikes.  - I encouraged the patient to continue following their prescribed treatment plan for diabetes - I informed the patient to get help if blood sugar drops below 54mg /dL, or if suddenly have trouble thinking clearly or breathing.            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  He has seen the back surgeon as well as a pain specialist in the past.      Cephalalgia    Patient is a lot of stress and headache.  His Covid test was negative.      Hypertriglyceridemia    Patient was advised to follow low-cholesterol low triglyceride diet.  Diet has been explained to him  before.  He has lost some weight.      Chronic lower extremity pain (Location of Secondary source of pain) (Bilateral) (L>R)    Patient has a chronic pain in the both legs and both legs.  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  He is wheelchair-bound.         Meds ordered this encounter  Medications  . azithromycin (ZITHROMAX) 250 MG tablet    Sig: 2 tab po daily  For 3 days    Dispense:  6 tablet    Refill:  0    Follow-up: No follow-ups on file.    [name], MD

## 2020-12-02 NOTE — Assessment & Plan Note (Signed)
Patient was started on Z-Pak as directed.

## 2020-12-02 NOTE — Assessment & Plan Note (Signed)
-   I encouraged the patient to regularly check blood sugar.  - I encouraged the patient to monitor diet. I encouraged the patient to eat low-carb and low-sugar to help prevent blood sugar spikes.  - I encouraged the patient to continue following their prescribed treatment plan for diabetes - I informed the patient to get help if blood sugar drops below 54mg/dL, or if suddenly have trouble thinking clearly or breathing.     

## 2020-12-03 NOTE — Addendum Note (Signed)
Addended by: Minette Headland A on: 12/03/2020 08:55 AM   Modules accepted: Orders

## 2020-12-13 ENCOUNTER — Other Ambulatory Visit: Payer: Self-pay | Admitting: Urology

## 2020-12-13 ENCOUNTER — Other Ambulatory Visit: Payer: Self-pay | Admitting: Internal Medicine

## 2020-12-19 ENCOUNTER — Encounter: Payer: Self-pay | Admitting: Family Medicine

## 2020-12-19 ENCOUNTER — Ambulatory Visit (INDEPENDENT_AMBULATORY_CARE_PROVIDER_SITE_OTHER): Payer: Medicare PPO | Admitting: Family Medicine

## 2020-12-19 VITALS — BP 117/75 | HR 86 | Ht 69.0 in

## 2020-12-19 DIAGNOSIS — R1084 Generalized abdominal pain: Secondary | ICD-10-CM | POA: Diagnosis not present

## 2020-12-19 LAB — CBC WITH DIFFERENTIAL/PLATELET
Absolute Monocytes: 929 cells/uL (ref 200–950)
Basophils Absolute: 81 cells/uL (ref 0–200)
Basophils Relative: 0.8 %
Eosinophils Absolute: 323 cells/uL (ref 15–500)
Eosinophils Relative: 3.2 %
HCT: 43.4 % (ref 38.5–50.0)
Hemoglobin: 14.7 g/dL (ref 13.2–17.1)
Lymphs Abs: 1384 cells/uL (ref 850–3900)
MCH: 29.3 pg (ref 27.0–33.0)
MCHC: 33.9 g/dL (ref 32.0–36.0)
MCV: 86.5 fL (ref 80.0–100.0)
MPV: 11.6 fL (ref 7.5–12.5)
Monocytes Relative: 9.2 %
Neutro Abs: 7383 cells/uL (ref 1500–7800)
Neutrophils Relative %: 73.1 %
Platelets: 222 10*3/uL (ref 140–400)
RBC: 5.02 10*6/uL (ref 4.20–5.80)
RDW: 14.9 % (ref 11.0–15.0)
Total Lymphocyte: 13.7 %
WBC: 10.1 10*3/uL (ref 3.8–10.8)

## 2020-12-19 MED ORDER — METRONIDAZOLE 500 MG PO TABS: 500.0000 mg | ORAL_TABLET | Freq: Three times a day (TID) | ORAL | 0 refills | Status: DC

## 2020-12-19 MED ORDER — AMOXICILLIN 500 MG PO CAPS: 500.0000 mg | ORAL_CAPSULE | Freq: Three times a day (TID) | ORAL | 0 refills | Status: DC

## 2020-12-19 NOTE — Assessment & Plan Note (Addendum)
Patient describes 3 days history of mid abdominal pain without n/v/d. He says that it feels similar to his ulcers that he had for an extended amount of time years ago. He denies blood in his stool, pain worse after eating, described as burning.  Plan- CBC with Diff, I am going to start therapy for H Pylori, patient to call 911 with any worsening pain or n/v develop.

## 2020-12-19 NOTE — Progress Notes (Signed)
Established Patient Office Visit  SUBJECTIVE:  Subjective  Patient ID: Eddie Carter, male    DOB: 12-09-1940  Age: 80 y.o. MRN: [id number]  CC:  Chief Complaint  Patient presents with  . Abdominal Pain    Patient complains of abdominal pain x3 days. Pain is located in the middle of his abdomen, he states he has had an ulcer in the past and feels like it could be it again. The pain increases after eating, no nausea, vomiting, diarrhea. Patient states he is only having pain and feels very fatigued.    HPI TERREN KALLSTROM is a 80 y.o. male presenting today for     Past Medical History:  Diagnosis Date  . Arthritis   . Asthma   . BPH (benign prostatic hyperplasia)   . 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 Complication of anesthesia    after septoplasty and uvulectomy-was in icu- [facility] - 10 yrs. ago  . COPD (chronic obstructive pulmonary disease) ([place])   . Depression   . Diabetes mellitus without complication ([place])   . Diverticulitis   . ED (erectile dysfunction)   . Full dentures   . GERD (gastroesophageal reflux disease)    no meds  . Headache    difficulty with headaches- 1950's , again in 1967, 2008- again problems with migrraines   . History of GI bleed   . History of multiple pulmonary nodules   . History of shingles   . History of urethral stricture   . HOH (hard of hearing)   . HOH (hard of hearing)   . Hypogonadism in male   . IBS (irritable bowel syndrome)   . Joint pain   . Medial meniscus, posterior horn derangement    left knee  . Melanosis coli   . Obesity   . Pneumonia 2014   [facility]  . Shortness of breath   . Skin cancer   . Sleep apnea    uses a cpap, most nights   . Stroke [facility]) 1995   some speech and thought processes slow  . Traumatic tear of lateral meniscus of right knee    right knee  . Urinary frequency   . Urinary incontinence     Past Surgical History:  Procedure Laterality Date  . APPENDECTOMY  1963  . BACK SURGERY     . CARPAL TUNNEL RELEASE     left  . CATARACT EXTRACTION W/PHACO Left 09/13/2018   Procedure: CATARACT EXTRACTION PHACO AND INTRAOCULAR LENS PLACEMENT (IOC);  Surgeon: Birder Robson, MD;  Location: [facility] ORS;  Service: Ophthalmology;  Laterality: Left;  [place]  00:42 CDE 6.73 Fluid pack lot #@ [id number]  . CATARACT EXTRACTION W/PHACO Right 11/23/2019   Procedure: CATARACT EXTRACTION PHACO AND INTRAOCULAR LENS PLACEMENT ([place]) RIGHT;  Surgeon: Birder Robson, MD;  Location: [facility] ORS;  Service: Ophthalmology;  Laterality: Right;  [place] 01:10.1 CDE 10.25 Fluid Pack Lot # [id number]  . COLONOSCOPY    . COLONOSCOPY WITH PROPOFOL N/A 12/07/2016   Procedure: COLONOSCOPY WITH PROPOFOL;  Surgeon: Lollie Sails, MD;  Location: [facility] ENDOSCOPY;  Service: Endoscopy;  Laterality: N/A;  . COLONOSCOPY WITH PROPOFOL N/A 06/26/2019   Procedure: COLONOSCOPY WITH PROPOFOL;  Surgeon: Lollie Sails, MD;  Location: [facility] ENDOSCOPY;  Service: Endoscopy;  Laterality: N/A;  . EYE SURGERY     foreign body removed fr. eye, ? side   . FOOT FOREIGN BODY REMOVAL  1976   left foot -multiple pieces glass  .  HEMORROIDECTOMY  1995  . KNEE ARTHROSCOPY Bilateral 01/17/2013   Procedure: ARTHROSCOPY KNEE BILATERAL WITH MEDIAL AND LATERAL MENISECTOMIES;  Surgeon: Lorn Junes, MD;  Location: [place];  Service: Orthopedics;  Laterality: Bilateral;  . KYPHOPLASTY N/A 07/23/2017   Procedure: KYPHOPLASTY- L1;  Surgeon: Hessie Knows, MD;  Location: [facility] ORS;  Service: Orthopedics;  Laterality: N/A;  . LUMBAR LAMINECTOMY N/A 11/05/2014   Procedure: LEFT L3-4 MICRODISCECTOMY;  Surgeon: Jessy Oto, MD;  Location: [place];  Service: Orthopedics;  Laterality: N/A;  . LUMBAR LAMINECTOMY/DECOMPRESSION MICRODISCECTOMY N/A 02/12/2015   Procedure: RE-DO LEFT L3-4 MICRODISCECTOMY;  Surgeon: Jessy Oto, MD;  Location: [place];  Service: Orthopedics;  Laterality: N/A;  . NASAL SEPTUM SURGERY  1995  . TONSILLECTOMY    .  UPPER GI ENDOSCOPY    . UVULOPALATOPHARYNGOPLASTY (UPPP)/TONSILLECTOMY/SEPTOPLASTY  1995   same time with septoplasty-ended up ICU almost trached    Family History  Problem Relation Age of Onset  . Cancer Mother   . Heart attack Father   . Heart disease Father   . Heart disease Other   . Arthritis Other   . Prostate cancer Neg Hx   . Kidney disease Neg Hx     Social History   Socioeconomic History  . Marital status: Married    Spouse name: Mardene Celeste  . Number of children: 2  . Years of education: 61  . Highest education level: Not on file  Occupational History    Comment: register of deeds  Tobacco Use  . Smoking status: Former Smoker    Packs/day: 1.50    Years: 40.00    Pack years: 60.00    Types: Cigarettes    Quit date: 01/14/1996    Years since quitting: 24.9  . Smokeless tobacco: Never Used  Vaping Use  . Vaping Use: Never used  Substance and Sexual Activity  . Alcohol use: No    Comment: wine in a month  . Drug use: No  . Sexual activity: Never  Other Topics Concern  . Not on file  Social History Narrative   Lives at home with wife, son   caffeine use - 2 cups coffee daily, sodas 1 a day, occas tea   Social Determinants of Health   Financial Resource Strain: Not on file  Food Insecurity: Not on file  Transportation Needs: Not on file  Physical Activity: Not on file  Stress: Not on file  Social Connections: Not on file  Intimate Partner Violence: Not on file     Current Outpatient Medications:  .  amoxicillin (AMOXIL) 500 MG capsule, Take 1 capsule (500 mg total) by mouth 3 (three) times daily., Disp: 42 capsule, Rfl: 0 .  dutasteride (AVODART) 0.5 MG capsule, TAKE 1 CAPSULE BY MOUTH EVERY DAY, Disp: 90 capsule, Rfl: 3 .  metroNIDAZOLE (FLAGYL) 500 MG tablet, Take 1 tablet (500 mg total) by mouth 3 (three) times daily., Disp: 42 tablet, Rfl: 0 .  acetaminophen (TYLENOL) 500 MG tablet, Take 1,000 mg by mouth every 6 (six) hours as needed for moderate  pain. , Disp: , Rfl:  .  ADVAIR HFA [id number] MCG/ACT inhaler, INHALE TWO PUFFS TWICE A DAY, Disp: 24 g, Rfl: 6 .  albuterol (VENTOLIN HFA) 108 (90 Base) MCG/ACT inhaler, PLACE ONE INHALATION TWICE DAILY, Disp: 54 g, Rfl: 6 .  azithromycin (ZITHROMAX) 250 MG tablet, 2 tab po daily  For 3 days, Disp: 6 tablet, Rfl: 0 .  Cholecalciferol (VITAMIN D3 PO), Take 1  tablet by mouth daily., Disp: , Rfl:  .  clopidogrel (PLAVIX) 75 MG tablet, Take 75 mg by mouth daily. , Disp: , Rfl:  .  Coenzyme Q10 (CO Q-10) 100 MG CAPS, Take 100 mg by mouth daily. , Disp: , Rfl:  .  donepezil (ARICEPT) 10 MG tablet, Take 10 mg by mouth at bedtime., Disp: , Rfl:  .  EPINEPHrine 0.3 mg/0.3 mL IJ SOAJ injection, Inject 0.3 mg into the muscle as needed for anaphylaxis. , Disp: , Rfl:  .  fluticasone (FLONASE) 50 MCG/ACT nasal spray, TAKE 2 SPRAYS INTO EACH NOSTRIL EVERY DAY, Disp: 16 g, Rfl: 5 .  furosemide (LASIX) 40 MG tablet, TAKE ONE TABLET BY MOUTH EVERY DAY, Disp: 90 tablet, Rfl: 1 .  glucosamine-chondroitin 500-400 MG tablet, Take 1 tablet by mouth 3 (three) times daily., Disp: , Rfl:  .  hydrOXYzine (ATARAX/VISTARIL) 10 MG tablet, Take 20 mg by mouth at bedtime. , Disp: , Rfl:  .  ketoconazole (NIZORAL) 2 % shampoo, Apply 1 application topically 2 (two) times a week. Apply to scalp twice weekly leave in for 5 to 10 mins., Disp: 120 mL, Rfl: 11 .  levothyroxine (SYNTHROID) 50 MCG tablet, TAKE 1 TABLET EVERY DAY ON EMPTY STOMACHWITH A GLASS OF WATER AT LEAST 30-60 MINBEFORE BREAKFAST, Disp: 90 tablet, Rfl: 2 .  lidocaine (LIDODERM) 5 %, Place 1 patch onto the skin daily. , Disp: , Rfl:  .  Melatonin 3 MG TABS, Take 1 tablet by mouth at bedtime., Disp: , Rfl:  .  Methen-Hyosc-Meth Blue-Na Phos (UROGESIC-BLUE) 81.6 MG TABS, 1 tab every 8 hours as needed for burning, Disp: 20 tablet, Rfl: 0 .  mirabegron ER (MYRBETRIQ) 25 MG TB24 tablet, Take 25 mg by mouth daily., Disp: , Rfl:  .  phenazopyridine (PYRIDIUM) 100 MG tablet,  Take 1 tablet (100 mg total) by mouth 3 (three) times daily as needed for pain., Disp: 10 tablet, Rfl: 0 .  RA KRILL OIL 500 MG CAPS, Take 500 mg by mouth daily. , Disp: , Rfl:  .  SYMBICORT 160-4.5 MCG/ACT inhaler, USE 2 PUFFS INTO LUNGS TWICE DAILY, Disp: 20.4 g, Rfl: 6 .  tamsulosin (FLOMAX) 0.4 MG CAPS capsule, TAKE 1 CAPSULE BY MOUTH EVERY DAY, Disp: 90 capsule, Rfl: 3 .  tiotropium (SPIRIVA) 18 MCG inhalation capsule, Place 1 capsule (18 mcg total) into inhaler and inhale daily., Disp: 90 capsule, Rfl: 1 .  TOLTERODINE TARTRATE PO, Take 4 mg by mouth daily., Disp: , Rfl:  .  topiramate (TOPAMAX) 50 MG tablet, Take 50 mg by mouth 2 (two) times daily., Disp: , Rfl:  .  TOVIAZ 8 MG TB24 tablet, Take 8 mg by mouth daily., Disp: , Rfl:  .  Turmeric (QC TUMERIC COMPLEX) 500 MG CAPS, Take by mouth., Disp: , Rfl:  .  vitamin B-12 (CYANOCOBALAMIN) 1000 MCG tablet, Take 1,000 mcg by mouth daily., Disp: , Rfl:    Allergies  Allergen Reactions  . Asa [Aspirin] Shortness Of Breath  . Bee Venom Anaphylaxis  . Nsaids Other (See Comments)    Makes him bleed.  . Tolmetin Other (See Comments)    Makes him bleed.    ROS Review of Systems  Constitutional: Positive for appetite change and fatigue.  HENT: Negative.   Cardiovascular: Negative.   Gastrointestinal: Positive for abdominal pain. Negative for anal bleeding, blood in stool, constipation, diarrhea, nausea, rectal pain and vomiting.  Genitourinary: Negative.   Musculoskeletal: Negative.   Skin: Positive for pallor.  Neurological: Negative.   Psychiatric/Behavioral: Negative.      OBJECTIVE:    Physical Exam Vitals and nursing note reviewed.  HENT:     Head: Normocephalic.     Mouth/Throat:     Mouth: Mucous membranes are moist.  Cardiovascular:     Rate and Rhythm: Normal rate and regular rhythm.  Pulmonary:     Effort: Pulmonary effort is normal.  Abdominal:     General: Bowel sounds are increased. There is distension.      Comments: No rebound tenderness, sounds are present throughout, neg murphy's sign.   Neurological:     Mental Status: He is alert.     BP 117/75   Pulse 86   Ht 5\' 9"  (1.753 m)   BMI 38.40 kg/m  Wt Readings from Last 3 Encounters:  12/03/20 260 lb (117.9 kg)  10/15/20 250 lb (113.4 kg)  07/12/20 255 lb (115.7 kg)    Health Maintenance Due  Topic Date Due  . Hepatitis C Screening  Never done  . OPHTHALMOLOGY EXAM  Never done  . URINE MICROALBUMIN  Never done  . COVID-19 Vaccine (1) Never done  . TETANUS/TDAP  Never done  . PNA vac Low Risk Adult (1 of 2 - PCV13) Never done  . HEMOGLOBIN A1C  12/01/2017  . INFLUENZA VACCINE  06/23/2020  . FOOT EXAM  07/09/2020    There are no preventive care reminders to display for this patient.  CBC Latest Ref Rng & Units 07/15/2017 07/13/2017 01/16/2016  WBC 3.8 - 10.6 K/uL 12.0(H) 13.8(H) -  Hemoglobin 13.0 - 18.0 g/dL 13.8 13.6 -  Hematocrit 40.0 - 52.0 % 40.6 39.8(L) 44.1  Platelets 150 - 440 K/uL 163 181 -   CMP Latest Ref Rng & Units 06/05/2020 07/13/2017 01/16/2016  Glucose 65 - 99 mg/dL 176(H) 124(H) -  BUN 7 - 25 mg/dL 17 11 -  Creatinine 0.70 - 1.18 mg/dL 1.09 0.87 -  Sodium 135 - 146 mmol/L 139 140 -  Potassium 3.5 - 5.3 mmol/L 3.9 4.0 -  Chloride 98 - 110 mmol/L 102 107 -  CO2 20 - 32 mmol/L 27 26 -  Calcium 8.6 - 10.3 mg/dL 9.0 8.9 -  Total Protein 6.1 - 8.1 g/dL 6.3 6.6 6.4  Total Bilirubin 0.2 - 1.2 mg/dL 0.6 1.0 0.3  Alkaline Phos 38 - 126 U/L - 54 57  AST 10 - 35 U/L 13 20 30   ALT 9 - 46 U/L 11 13(L) 34    Lab Results  Component Value Date   TSH 6.430 (H) 01/16/2016   Lab Results  Component Value Date   ALBUMIN 3.4 (L) 07/13/2017   ANIONGAP 7 07/13/2017   Lab Results  Component Value Date   CHOL 187 01/16/2016   CHOL 159 10/30/2014   CHOL 172 07/26/2013   HDL 26 (L) 01/16/2016   HDL 32 (L) 10/30/2014   HDL 37 (L) 07/26/2013   LDLCALC 110 (H) 01/16/2016   LDLCALC 98 10/30/2014   LDLCALC 85 07/26/2013    CHOLHDL 7.2 (H) 01/16/2016   Lab Results  Component Value Date   TRIG 253 (H) 01/16/2016   Lab Results  Component Value Date   HGBA1C 5.7 (H) 05/31/2017   HGBA1C 6.2 (H) 05/21/2015   HGBA1C 6.9 (H) 11/05/2014      ASSESSMENT & PLAN:   Problem List Items Addressed This Visit      Other   Generalized abdominal pain - Primary    Patient describes 3 days  history of mid abdominal pain without n/v/d. He says that it feels similar to his ulcers that he had for an extended amount of time years ago. He denies blood in his stool, pain worse after eating, described as burning.  Plan- CBC with Diff, I am going to start therapy for H Pylori, patient to call 911 with any worsening pain or n/v develop.       Relevant Orders   CBC w/Diff/Platelet      Meds ordered this encounter  Medications  . amoxicillin (AMOXIL) 500 MG capsule    Sig: Take 1 capsule (500 mg total) by mouth 3 (three) times daily.    Dispense:  42 capsule    Refill:  0  . metroNIDAZOLE (FLAGYL) 500 MG tablet    Sig: Take 1 tablet (500 mg total) by mouth 3 (three) times daily.    Dispense:  42 tablet    Refill:  0      Follow-up: No follow-ups on file.    Beckie Salts, [place] [address] [place], [place] [address]

## 2021-01-03 ENCOUNTER — Other Ambulatory Visit: Payer: Self-pay

## 2021-01-03 ENCOUNTER — Inpatient Hospital Stay
Admission: EM | Admit: 2021-01-03 | Discharge: 2021-01-18 | DRG: 843 | Disposition: A | Discharge status: Hospice | Payer: Medicare PPO | Attending: Hospitalist | Admitting: Hospitalist

## 2021-01-03 ENCOUNTER — Emergency Department: Payer: Medicare PPO

## 2021-01-03 DIAGNOSIS — C7A1 Malignant poorly differentiated neuroendocrine tumors: Principal | ICD-10-CM | POA: Diagnosis present

## 2021-01-03 DIAGNOSIS — I639 Cerebral infarction, unspecified: Secondary | ICD-10-CM | POA: Diagnosis present

## 2021-01-03 DIAGNOSIS — N138 Other obstructive and reflux uropathy: Secondary | ICD-10-CM | POA: Diagnosis present

## 2021-01-03 DIAGNOSIS — Z888 Allergy status to other drugs, medicaments and biological substances status: Secondary | ICD-10-CM

## 2021-01-03 DIAGNOSIS — R531 Weakness: Secondary | ICD-10-CM

## 2021-01-03 DIAGNOSIS — K219 Gastro-esophageal reflux disease without esophagitis: Secondary | ICD-10-CM | POA: Diagnosis present

## 2021-01-03 DIAGNOSIS — I69328 Other speech and language deficits following cerebral infarction: Secondary | ICD-10-CM | POA: Diagnosis not present

## 2021-01-03 DIAGNOSIS — F0390 Unspecified dementia without behavioral disturbance: Secondary | ICD-10-CM | POA: Diagnosis not present

## 2021-01-03 DIAGNOSIS — G893 Neoplasm related pain (acute) (chronic): Secondary | ICD-10-CM | POA: Diagnosis present

## 2021-01-03 DIAGNOSIS — Z809 Family history of malignant neoplasm, unspecified: Secondary | ICD-10-CM

## 2021-01-03 DIAGNOSIS — Z87891 Personal history of nicotine dependence: Secondary | ICD-10-CM

## 2021-01-03 DIAGNOSIS — Z20822 Contact with and (suspected) exposure to covid-19: Secondary | ICD-10-CM | POA: Diagnosis present

## 2021-01-03 DIAGNOSIS — R627 Adult failure to thrive: Secondary | ICD-10-CM | POA: Diagnosis not present

## 2021-01-03 DIAGNOSIS — M1990 Unspecified osteoarthritis, unspecified site: Secondary | ICD-10-CM | POA: Diagnosis present

## 2021-01-03 DIAGNOSIS — R410 Disorientation, unspecified: Secondary | ICD-10-CM | POA: Diagnosis not present

## 2021-01-03 DIAGNOSIS — Z85828 Personal history of other malignant neoplasm of skin: Secondary | ICD-10-CM

## 2021-01-03 DIAGNOSIS — N401 Enlarged prostate with lower urinary tract symptoms: Secondary | ICD-10-CM | POA: Diagnosis not present

## 2021-01-03 DIAGNOSIS — Z7189 Other specified counseling: Secondary | ICD-10-CM

## 2021-01-03 DIAGNOSIS — R160 Hepatomegaly, not elsewhere classified: Secondary | ICD-10-CM

## 2021-01-03 DIAGNOSIS — E039 Hypothyroidism, unspecified: Secondary | ICD-10-CM | POA: Diagnosis present

## 2021-01-03 DIAGNOSIS — C7B8 Other secondary neuroendocrine tumors: Secondary | ICD-10-CM | POA: Diagnosis not present

## 2021-01-03 DIAGNOSIS — C3490 Malignant neoplasm of unspecified part of unspecified bronchus or lung: Secondary | ICD-10-CM | POA: Diagnosis not present

## 2021-01-03 DIAGNOSIS — C7931 Secondary malignant neoplasm of brain: Secondary | ICD-10-CM | POA: Diagnosis present

## 2021-01-03 DIAGNOSIS — E860 Dehydration: Secondary | ICD-10-CM | POA: Diagnosis present

## 2021-01-03 DIAGNOSIS — J849 Interstitial pulmonary disease, unspecified: Secondary | ICD-10-CM | POA: Diagnosis not present

## 2021-01-03 DIAGNOSIS — F32A Depression, unspecified: Secondary | ICD-10-CM | POA: Diagnosis present

## 2021-01-03 DIAGNOSIS — R918 Other nonspecific abnormal finding of lung field: Secondary | ICD-10-CM | POA: Diagnosis not present

## 2021-01-03 DIAGNOSIS — E1140 Type 2 diabetes mellitus with diabetic neuropathy, unspecified: Secondary | ICD-10-CM | POA: Diagnosis present

## 2021-01-03 DIAGNOSIS — Z8249 Family history of ischemic heart disease and other diseases of the circulatory system: Secondary | ICD-10-CM

## 2021-01-03 DIAGNOSIS — R7989 Other specified abnormal findings of blood chemistry: Secondary | ICD-10-CM | POA: Diagnosis not present

## 2021-01-03 DIAGNOSIS — R300 Dysuria: Secondary | ICD-10-CM | POA: Diagnosis not present

## 2021-01-03 DIAGNOSIS — K589 Irritable bowel syndrome without diarrhea: Secondary | ICD-10-CM | POA: Diagnosis present

## 2021-01-03 DIAGNOSIS — R45851 Suicidal ideations: Secondary | ICD-10-CM | POA: Diagnosis not present

## 2021-01-03 DIAGNOSIS — K769 Liver disease, unspecified: Secondary | ICD-10-CM | POA: Diagnosis not present

## 2021-01-03 DIAGNOSIS — C7A8 Other malignant neuroendocrine tumors: Secondary | ICD-10-CM

## 2021-01-03 DIAGNOSIS — I69318 Other symptoms and signs involving cognitive functions following cerebral infarction: Secondary | ICD-10-CM | POA: Diagnosis not present

## 2021-01-03 DIAGNOSIS — I6782 Cerebral ischemia: Secondary | ICD-10-CM | POA: Diagnosis not present

## 2021-01-03 DIAGNOSIS — F419 Anxiety disorder, unspecified: Secondary | ICD-10-CM | POA: Diagnosis present

## 2021-01-03 DIAGNOSIS — R911 Solitary pulmonary nodule: Secondary | ICD-10-CM | POA: Diagnosis not present

## 2021-01-03 DIAGNOSIS — R079 Chest pain, unspecified: Secondary | ICD-10-CM | POA: Diagnosis present

## 2021-01-03 DIAGNOSIS — Z79890 Hormone replacement therapy (postmenopausal): Secondary | ICD-10-CM

## 2021-01-03 DIAGNOSIS — Z886 Allergy status to analgesic agent status: Secondary | ICD-10-CM

## 2021-01-03 DIAGNOSIS — R630 Anorexia: Secondary | ICD-10-CM | POA: Diagnosis not present

## 2021-01-03 DIAGNOSIS — F322 Major depressive disorder, single episode, severe without psychotic features: Secondary | ICD-10-CM | POA: Diagnosis not present

## 2021-01-03 DIAGNOSIS — G4733 Obstructive sleep apnea (adult) (pediatric): Secondary | ICD-10-CM | POA: Diagnosis present

## 2021-01-03 DIAGNOSIS — G9341 Metabolic encephalopathy: Secondary | ICD-10-CM | POA: Diagnosis not present

## 2021-01-03 DIAGNOSIS — Z515 Encounter for palliative care: Secondary | ICD-10-CM

## 2021-01-03 DIAGNOSIS — J449 Chronic obstructive pulmonary disease, unspecified: Secondary | ICD-10-CM | POA: Diagnosis not present

## 2021-01-03 DIAGNOSIS — Z66 Do not resuscitate: Secondary | ICD-10-CM | POA: Diagnosis present

## 2021-01-03 DIAGNOSIS — Z91030 Bee allergy status: Secondary | ICD-10-CM

## 2021-01-03 DIAGNOSIS — R9431 Abnormal electrocardiogram [ECG] [EKG]: Secondary | ICD-10-CM | POA: Diagnosis not present

## 2021-01-03 DIAGNOSIS — E11649 Type 2 diabetes mellitus with hypoglycemia without coma: Secondary | ICD-10-CM | POA: Diagnosis not present

## 2021-01-03 DIAGNOSIS — R945 Abnormal results of liver function studies: Secondary | ICD-10-CM

## 2021-01-03 DIAGNOSIS — Z7902 Long term (current) use of antithrombotics/antiplatelets: Secondary | ICD-10-CM

## 2021-01-03 DIAGNOSIS — E872 Acidosis: Secondary | ICD-10-CM | POA: Diagnosis present

## 2021-01-03 DIAGNOSIS — C787 Secondary malignant neoplasm of liver and intrahepatic bile duct: Secondary | ICD-10-CM | POA: Diagnosis present

## 2021-01-03 DIAGNOSIS — Z79899 Other long term (current) drug therapy: Secondary | ICD-10-CM

## 2021-01-03 DIAGNOSIS — R109 Unspecified abdominal pain: Secondary | ICD-10-CM | POA: Diagnosis not present

## 2021-01-03 DIAGNOSIS — K7689 Other specified diseases of liver: Secondary | ICD-10-CM | POA: Diagnosis not present

## 2021-01-03 DIAGNOSIS — I5021 Acute systolic (congestive) heart failure: Secondary | ICD-10-CM | POA: Diagnosis not present

## 2021-01-03 DIAGNOSIS — F0150 Vascular dementia without behavioral disturbance: Secondary | ICD-10-CM | POA: Diagnosis not present

## 2021-01-03 DIAGNOSIS — R42 Dizziness and giddiness: Secondary | ICD-10-CM | POA: Diagnosis not present

## 2021-01-03 DIAGNOSIS — J431 Panlobular emphysema: Secondary | ICD-10-CM | POA: Diagnosis not present

## 2021-01-03 LAB — CBC WITH DIFFERENTIAL/PLATELET
Abs Immature Granulocytes: 0.05 10*3/uL (ref 0.00–0.07)
Basophils Absolute: 0.1 10*3/uL (ref 0.0–0.1)
Basophils Relative: 1 %
Eosinophils Absolute: 0.1 10*3/uL (ref 0.0–0.5)
Eosinophils Relative: 1 %
HCT: 47 % (ref 39.0–52.0)
Hemoglobin: 15.2 g/dL (ref 13.0–17.0)
Immature Granulocytes: 1 %
Lymphocytes Relative: 15 %
Lymphs Abs: 1.4 10*3/uL (ref 0.7–4.0)
MCH: 29.5 pg (ref 26.0–34.0)
MCHC: 32.3 g/dL (ref 30.0–36.0)
MCV: 91.1 fL (ref 80.0–100.0)
Monocytes Absolute: 0.8 10*3/uL (ref 0.1–1.0)
Monocytes Relative: 8 %
Neutro Abs: 6.7 10*3/uL (ref 1.7–7.7)
Neutrophils Relative %: 74 %
Platelets: 230 10*3/uL (ref 150–400)
RBC: 5.16 MIL/uL (ref 4.22–5.81)
RDW: 18.1 % — ABNORMAL HIGH (ref 11.5–15.5)
WBC: 9.1 10*3/uL (ref 4.0–10.5)
nRBC: 0 % (ref 0.0–0.2)

## 2021-01-03 LAB — URINALYSIS, ROUTINE W REFLEX MICROSCOPIC
Bilirubin Urine: NEGATIVE
Glucose, UA: NEGATIVE mg/dL
Hgb urine dipstick: NEGATIVE
Ketones, ur: NEGATIVE mg/dL
Leukocytes,Ua: NEGATIVE
Nitrite: NEGATIVE
Protein, ur: NEGATIVE mg/dL
Specific Gravity, Urine: 1.043 — ABNORMAL HIGH (ref 1.005–1.030)
pH: 5 (ref 5.0–8.0)

## 2021-01-03 LAB — COMPREHENSIVE METABOLIC PANEL
ALT: 102 U/L — ABNORMAL HIGH (ref 0–44)
AST: 261 U/L — ABNORMAL HIGH (ref 15–41)
Albumin: 3.4 g/dL — ABNORMAL LOW (ref 3.5–5.0)
Alkaline Phosphatase: 616 U/L — ABNORMAL HIGH (ref 38–126)
Anion gap: 13 (ref 5–15)
BUN: 10 mg/dL (ref 8–23)
CO2: 26 mmol/L (ref 22–32)
Calcium: 9.3 mg/dL (ref 8.9–10.3)
Chloride: 96 mmol/L — ABNORMAL LOW (ref 98–111)
Creatinine, Ser: 0.9 mg/dL (ref 0.61–1.24)
GFR, Estimated: >60 mL/min (ref >60)
Glucose, Bld: 93 mg/dL (ref 70–99)
Potassium: 3.8 mmol/L (ref 3.5–5.1)
Sodium: 135 mmol/L (ref 135–145)
Total Bilirubin: 3.3 mg/dL — ABNORMAL HIGH (ref 0.3–1.2)
Total Protein: 7.6 g/dL (ref 6.5–8.1)

## 2021-01-03 LAB — LACTIC ACID, PLASMA
Lactic Acid, Venous: 3.4 mmol/L (ref 0.5–1.9)
Lactic Acid, Venous: 2.5 mmol/L (ref 0.5–1.9)

## 2021-01-03 LAB — RESP PANEL BY RT-PCR (FLU A&B, COVID) ARPGX2
Influenza A by PCR: NEGATIVE
Influenza B by PCR: NEGATIVE
SARS Coronavirus 2 by RT PCR: NEGATIVE

## 2021-01-03 LAB — PROTIME-INR
INR: 1.2 (ref 0.8–1.2)
Prothrombin Time: 14.3 seconds (ref 11.4–15.2)

## 2021-01-03 LAB — TROPONIN I (HIGH SENSITIVITY)
Troponin I (High Sensitivity): 11 ng/L (ref <18)
Troponin I (High Sensitivity): 12 ng/L (ref <18)

## 2021-01-03 LAB — LIPASE, BLOOD: Lipase: 45 U/L (ref 11–51)

## 2021-01-03 LAB — APTT: aPTT: 25 seconds (ref 24–36)

## 2021-01-03 LAB — FIBRIN DERIVATIVES D-DIMER (ARMC ONLY): Fibrin derivatives D-dimer (ARMC): 2330.78 ng/mL (FEU) — ABNORMAL HIGH (ref 0.00–499.00)

## 2021-01-03 MED ORDER — MIRABEGRON ER 25 MG PO TB24
25.0000 mg | ORAL_TABLET | Freq: Every day | ORAL | Status: DC
  Administered 2021-01-04 – 2021-01-09 (×5): 25 mg via ORAL
  Filled 2021-01-03 (×6): qty 1

## 2021-01-03 MED ORDER — OXYCODONE HCL 5 MG PO TABS
5.0000 mg | ORAL_TABLET | Freq: Four times a day (QID) | ORAL | Status: DC | PRN
  Administered 2021-01-03 – 2021-01-08 (×6): 5 mg via ORAL
  Filled 2021-01-03 (×6): qty 1

## 2021-01-03 MED ORDER — GLUCOSAMINE-CHONDROITIN 500-400 MG PO TABS: 1.0000 | ORAL_TABLET | Freq: Three times a day (TID) | ORAL | Status: DC

## 2021-01-03 MED ORDER — FESOTERODINE FUMARATE ER 8 MG PO TB24
8.0000 mg | ORAL_TABLET | Freq: Every day | ORAL | Status: DC
  Administered 2021-01-03 – 2021-01-09 (×6): 8 mg via ORAL
  Filled 2021-01-03 (×7): qty 1

## 2021-01-03 MED ORDER — MELATONIN 3 MG PO TABS
3.0000 mg | ORAL_TABLET | Freq: Every day | ORAL | Status: DC
  Administered 2021-01-03 – 2021-01-17 (×15): 3 mg via ORAL
  Filled 2021-01-03 (×16): qty 1

## 2021-01-03 MED ORDER — DM-GUAIFENESIN ER 30-600 MG PO TB12: 1.0000 | ORAL_TABLET | Freq: Two times a day (BID) | ORAL | Status: DC | PRN

## 2021-01-03 MED ORDER — MOMETASONE FURO-FORMOTEROL FUM 200-5 MCG/ACT IN AERO
2.0000 | INHALATION_SPRAY | Freq: Two times a day (BID) | RESPIRATORY_TRACT | Status: DC
  Administered 2021-01-03 – 2021-01-18 (×22): 2 via RESPIRATORY_TRACT
  Filled 2021-01-03: qty 8.8

## 2021-01-03 MED ORDER — CO Q-10 100 MG PO CAPS: 100.0000 mg | ORAL_CAPSULE | Freq: Every day | ORAL | Status: DC

## 2021-01-03 MED ORDER — IOHEXOL 300 MG/ML  SOLN
100.0000 mL | Freq: Once | INTRAMUSCULAR | Status: AC | PRN
  Administered 2021-01-03: 100 mL via INTRAVENOUS

## 2021-01-03 MED ORDER — SODIUM CHLORIDE 0.9 % IV SOLN: INTRAVENOUS | Status: DC

## 2021-01-03 MED ORDER — DONEPEZIL HCL 5 MG PO TABS
10.0000 mg | ORAL_TABLET | Freq: Every day | ORAL | Status: DC
  Administered 2021-01-03 – 2021-01-17 (×15): 10 mg via ORAL
  Filled 2021-01-03 (×15): qty 2

## 2021-01-03 MED ORDER — HYDROXYZINE HCL 10 MG PO TABS
10.0000 mg | ORAL_TABLET | Freq: Three times a day (TID) | ORAL | Status: DC | PRN
  Filled 2021-01-03: qty 1

## 2021-01-03 MED ORDER — HEPARIN BOLUS VIA INFUSION
6100.0000 [IU] | Freq: Once | INTRAVENOUS | Status: AC
  Administered 2021-01-03: 22:00:00 6100 [IU] via INTRAVENOUS
  Filled 2021-01-03: qty 6100

## 2021-01-03 MED ORDER — HEPARIN (PORCINE) 25000 UT/250ML-% IV SOLN
1750.0000 [IU]/h | INTRAVENOUS | Status: DC
  Administered 2021-01-03: 22:00:00 1700 [IU]/h via INTRAVENOUS
  Filled 2021-01-03 (×2): qty 250

## 2021-01-03 MED ORDER — HYDROXYZINE HCL 10 MG PO TABS
20.0000 mg | ORAL_TABLET | Freq: Every day | ORAL | Status: DC
  Administered 2021-01-03 – 2021-01-05 (×3): 20 mg via ORAL
  Administered 2021-01-06: 10 mg via ORAL
  Administered 2021-01-07 – 2021-01-17 (×11): 20 mg via ORAL
  Filled 2021-01-03 (×16): qty 2

## 2021-01-03 MED ORDER — ONDANSETRON HCL 4 MG/2ML IJ SOLN
4.0000 mg | INTRAMUSCULAR | Status: AC
  Administered 2021-01-03: 4 mg via INTRAVENOUS
  Filled 2021-01-03: qty 2

## 2021-01-03 MED ORDER — SODIUM CHLORIDE 0.9 % IV BOLUS
500.0000 mL | Freq: Once | INTRAVENOUS | Status: AC
  Administered 2021-01-03: 500 mL via INTRAVENOUS

## 2021-01-03 MED ORDER — TAMSULOSIN HCL 0.4 MG PO CAPS
0.4000 mg | ORAL_CAPSULE | Freq: Every day | ORAL | Status: DC
  Administered 2021-01-04 – 2021-01-18 (×14): 0.4 mg via ORAL
  Filled 2021-01-03 (×14): qty 1

## 2021-01-03 MED ORDER — VITAMIN D3 25 MCG (1000 UNIT) PO TABS
1000.0000 [IU] | ORAL_TABLET | Freq: Every day | ORAL | Status: DC
  Administered 2021-01-04 – 2021-01-18 (×13): 1000 [IU] via ORAL
  Filled 2021-01-03 (×25): qty 1

## 2021-01-03 MED ORDER — LEVOTHYROXINE SODIUM 50 MCG PO TABS
50.0000 ug | ORAL_TABLET | Freq: Every day | ORAL | Status: DC
  Administered 2021-01-04 – 2021-01-18 (×14): 50 ug via ORAL
  Filled 2021-01-03 (×13): qty 1

## 2021-01-03 MED ORDER — TOPIRAMATE 25 MG PO TABS
50.0000 mg | ORAL_TABLET | Freq: Two times a day (BID) | ORAL | Status: DC
  Administered 2021-01-03 – 2021-01-18 (×29): 50 mg via ORAL
  Filled 2021-01-03 (×31): qty 2

## 2021-01-03 MED ORDER — ALBUTEROL SULFATE HFA 108 (90 BASE) MCG/ACT IN AERS
2.0000 | INHALATION_SPRAY | RESPIRATORY_TRACT | Status: DC | PRN
  Filled 2021-01-03: qty 6.7

## 2021-01-03 MED ORDER — ACETAMINOPHEN 500 MG PO TABS
1000.0000 mg | ORAL_TABLET | ORAL | Status: AC
  Administered 2021-01-03: 1000 mg via ORAL
  Filled 2021-01-03: qty 2

## 2021-01-03 MED ORDER — TIOTROPIUM BROMIDE MONOHYDRATE 18 MCG IN CAPS
18.0000 ug | ORAL_CAPSULE | Freq: Every day | RESPIRATORY_TRACT | Status: DC
  Administered 2021-01-03 – 2021-01-18 (×11): 18 ug via RESPIRATORY_TRACT
  Filled 2021-01-03 (×2): qty 5

## 2021-01-03 MED ORDER — HEPARIN SODIUM (PORCINE) 5000 UNIT/ML IJ SOLN
5000.0000 [IU] | Freq: Three times a day (TID) | INTRAMUSCULAR | Status: DC
  Administered 2021-01-03: 21:00:00 5000 [IU] via SUBCUTANEOUS
  Filled 2021-01-03: qty 1

## 2021-01-03 MED ORDER — HYDROXYZINE HCL 50 MG/ML IM SOLN
25.0000 mg | Freq: Four times a day (QID) | INTRAMUSCULAR | Status: DC | PRN
  Filled 2021-01-03: qty 0.5

## 2021-01-03 MED ORDER — HYDRALAZINE HCL 20 MG/ML IJ SOLN: 5.0000 mg | INTRAMUSCULAR | Status: DC | PRN

## 2021-01-03 MED ORDER — DUTASTERIDE 0.5 MG PO CAPS
0.5000 mg | ORAL_CAPSULE | Freq: Every day | ORAL | Status: DC
Start: 2021-01-04 — End: 2021-01-18
  Administered 2021-01-04 – 2021-01-18 (×14): 0.5 mg via ORAL
  Filled 2021-01-03 (×15): qty 1

## 2021-01-03 MED ORDER — MORPHINE SULFATE (PF) 2 MG/ML IV SOLN: 0.5000 mg | INTRAVENOUS | Status: DC | PRN

## 2021-01-03 MED ORDER — FLUTICASONE PROPIONATE 50 MCG/ACT NA SUSP
1.0000 | Freq: Every day | NASAL | Status: DC
  Administered 2021-01-03 – 2021-01-18 (×10): 1 via NASAL
  Filled 2021-01-03: qty 16

## 2021-01-03 MED ORDER — VITAMIN B-12 1000 MCG PO TABS
1000.0000 ug | ORAL_TABLET | Freq: Every day | ORAL | Status: DC
  Administered 2021-01-04 – 2021-01-18 (×13): 1000 ug via ORAL
  Filled 2021-01-03 (×13): qty 1

## 2021-01-03 NOTE — Consult Note (Signed)
Hematology/Oncology Consult note [facility] Telephone:([phone] [phone]  Patient Care Team: [name], MD as PCP - General (Cardiology) Jessy Oto, MD as Consulting Physician (Orthopedic Surgery)   Name of the patient: Eddie Carter  [id number]  1941/05/15    Reason for consult: Concern for metastatic lung cancer  Requesting physician: Dr. Blaine Hamper  Date of visit: 01/03/2021    History of presenting illness-patient is a 80 year old male with a past medical history significant for hypothyroidism,GERD, diabetes, basal cell cancer of the face, BPH among other medical problems.  He presented to the hospital with symptoms of abdominal pain and weakness.He had CT abdomen and pelvis with contrast which showed a 5.6 x 4.4 cm lobulated mass in the left lower lobe concerning for malignancy.  Multiple densities noted in the liver concerning for diffuse hepatic metastases with the largest one measuring 7.1 x 4.3 cm.  2.4 cm left periaortic lymph node concerning for metastatic disease.  Oncology consulted for further management.  Patient lives alone. He is going through divorce from his ex wife who he was married for 55 years. 40 pack year history of smoking but says does njot smoke presently  ECOG PS- 1-2  Pain scale- 4   Review of systems- Review of Systems  Constitutional: Positive for malaise/fatigue. Negative for chills, fever and weight loss.  HENT: Negative for congestion, ear discharge and nosebleeds.   Eyes: Negative for blurred vision.  Respiratory: Negative for cough, hemoptysis, sputum production, shortness of breath and wheezing.   Cardiovascular: Negative for chest pain, palpitations, orthopnea and claudication.  Gastrointestinal: Negative for abdominal pain, blood in stool, constipation, diarrhea, heartburn, melena, nausea and vomiting.       Ruq abdominal pain  Genitourinary: Negative for dysuria, flank pain, frequency, hematuria and  urgency.  Musculoskeletal: Negative for back pain, joint pain and myalgias.  Skin: Negative for rash.  Neurological: Negative for dizziness, tingling, focal weakness, seizures, weakness and headaches.  Endo/Heme/Allergies: Does not bruise/bleed easily.  Psychiatric/Behavioral: Negative for depression and suicidal ideas. The patient does not have insomnia.     Allergies  Allergen Reactions  . Asa [Aspirin] Shortness Of Breath  . Bee Venom Anaphylaxis  . Nsaids Other (See Comments)    Makes him bleed.  . Tolmetin Other (See Comments)    Makes him bleed.    Patient Active Problem List   Diagnosis Date Noted  . Lung mass 01/03/2021  . Abnormal LFTs 01/03/2021  . Hypothyroidism 01/03/2021  . Elevated lactic acid level 01/03/2021  . Liver metastasis ([place]) 01/03/2021  . Generalized abdominal pain 12/19/2020  . Viral upper respiratory tract infection 12/02/2020  . Lymphedema 10/15/2020  . Benign prostatic hyperplasia 10/15/2020  . Recurrent headache 10/15/2020  . Hx of erectile dysfunction 10/15/2020  . Primary osteoarthritis of right knee 09/25/2020  . Tremor, essential 09/06/2020  . Joint pain 06/14/2020  . Morbid obesity ([place]) 06/14/2020  . Swelling of limb 06/14/2020  . Bilateral sacroiliitis ([place]) 05/15/2020  . Edema of both lower legs 05/09/2020  . Spondylosis of lumbar region without myelopathy or radiculopathy 03/26/2020  . Pain due to onychomycosis of toenails of both feet 07/10/2019  . Coagulation defect ([place]) 07/10/2019  . Joint pain and swelling due to Lyme disease 11/04/2017  . Headache disorder 10/11/2017  . Chronic pain of right knee 10/11/2017  . Ataxia 09/01/2017  . Loss of memory 09/01/2017  . Physical deconditioning 09/01/2017  . Trochanteric bursitis of both hips 09/01/2017  . Meralgia paresthetica, left  lower limb 09/01/2017  . Class 2 obesity with alveolar hypoventilation and body mass index (BMI) of 35.0 to 35.9 in adult ([place]) 08/10/2017  . Left hip  pain 08/09/2017  . Pain in right leg 08/09/2017  . History of headache 08/04/2017  . Dementia without behavioral disturbance ([place]) 08/04/2017  . H/O sleep apnea 08/04/2017  . Closed compression fracture of L1 lumbar vertebra 07/15/2017  . Back pain 07/15/2017  . Aspiration into airway 07/15/2017  . Wheezing 07/15/2017  . Leukocytosis 07/15/2017  . Bilateral calf pain 07/15/2017  . Low back pain 07/13/2017  . Cephalalgia 01/29/2016  . Obesity (BMI 35.0-39.9 without comorbidity) 01/29/2016  . Hypertriglyceridemia 01/29/2016  . H/O respiratory system disease 01/29/2016  . H/O erectile dysfunction 01/29/2016  . Cerebrovascular accident, old 01/29/2016  . 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 Chronic obstructive pulmonary disease ([place]) 01/29/2016  . Benign fibroma of prostate 01/29/2016  . Chronic pain 01/29/2016  . 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 Abnormal MRI, lumbar spine 01/29/2016  . Chronic low back pain (Location of Primary Source of Pain) (Bilateral) (L>R) 01/29/2016  . Chronic lower extremity pain (Location of Secondary source of pain) (Bilateral) (L>R) 01/29/2016  . Long term current use of opiate analgesic 01/29/2016  . Long term prescription opiate use 01/29/2016  . Opiate use 01/29/2016  . Encounter for therapeutic drug level monitoring 01/29/2016  . Encounter for pain management planning 01/29/2016  . Chronic lumbar radicular pain (Location of Secondary source of pain) (Bilateral) (L4 Dermatome) (L>R) 01/29/2016  . Chronic hip pain (Location of Tertiary source of pain) (Bilateral) (L>R) 01/29/2016  . Chronic knee pain (Bilateral) (L>R) 01/29/2016  . Chronic anticoagulation (Plavix) 01/29/2016  . Chronic bilateral low back pain without sciatica 01/29/2016  . Erectile dysfunction of organic origin 01/22/2016  . BPH with obstruction/lower urinary tract symptoms 07/24/2015  . Hypogonadism in male 07/24/2015  . HNP (herniated  nucleus pulposus), lumbar 11/05/2014    Class: Acute  . Herniated nucleus pulposus, lumbar 11/05/2014  . Pulmonary nodules 10/25/2013  . ILD (interstitial lung disease) ([place]) 08/22/2013  . Traumatic tear of lateral meniscus of right knee   . Medial meniscus, posterior horn derangement   . COPD (chronic obstructive pulmonary disease) ([place])   . Shortness of breath   . Sleep apnea   . Stroke ([place])   . Arthritis   . GERD (gastroesophageal reflux disease)   . History of GI bleed   . HOH (hard of hearing)   . Full dentures   . Complication of anesthesia      Past Medical History:  Diagnosis Date  . Arthritis   . Asthma   . BPH (benign prostatic hyperplasia)   . Cancer ([place])    face- basal cell  . Complication of anesthesia    after septoplasty and uvulectomy-was in icu- [facility] - 10 yrs. ago  . COPD (chronic obstructive pulmonary disease) ([place])   . Depression   . Diabetes mellitus without complication ([place])   . Diverticulitis   . ED (erectile dysfunction)   . Full dentures   . GERD (gastroesophageal reflux disease)    no meds  . Headache    difficulty with headaches- 1950's , again in 1967, 2008- again problems with migrraines   . History of GI bleed   . History of multiple pulmonary nodules   . History of shingles   . History of urethral stricture   . HOH (hard of hearing)   .  HOH (hard of hearing)   . Hypogonadism in male   . IBS (irritable bowel syndrome)   . Joint pain   . Medial meniscus, posterior horn derangement    left knee  . Melanosis coli   . Obesity   . Pneumonia 2014   [facility]  . Shortness of breath   . Skin cancer   . Sleep apnea    uses a cpap, most nights   . Stroke [facility]) 1995   some speech and thought processes slow  . Traumatic tear of lateral meniscus of right knee    right knee  . Urinary frequency   . Urinary incontinence      Past Surgical History:  Procedure Laterality Date  . APPENDECTOMY  1963  . BACK SURGERY    .  CARPAL TUNNEL RELEASE     left  . CATARACT EXTRACTION W/PHACO Left 09/13/2018   Procedure: CATARACT EXTRACTION PHACO AND INTRAOCULAR LENS PLACEMENT (IOC);  Surgeon: Birder Robson, MD;  Location: [facility] ORS;  Service: Ophthalmology;  Laterality: Left;  [place]  00:42 CDE 6.73 Fluid pack lot #@ [id number]  . CATARACT EXTRACTION W/PHACO Right 11/23/2019   Procedure: CATARACT EXTRACTION PHACO AND INTRAOCULAR LENS PLACEMENT ([place]) RIGHT;  Surgeon: Birder Robson, MD;  Location: [facility] ORS;  Service: Ophthalmology;  Laterality: Right;  [place] 01:10.1 CDE 10.25 Fluid Pack Lot # [id number]  . COLONOSCOPY    . COLONOSCOPY WITH PROPOFOL N/A 12/07/2016   Procedure: COLONOSCOPY WITH PROPOFOL;  Surgeon: Lollie Sails, MD;  Location[facility] [address] ENDOSCOPY;  Service: Endoscopy;  Laterality: N/A;  . COLONOSCOPY WITH PROPOFOL N/A 06/26/2019   Procedure: COLONOSCOPY WITH PROPOFOL;  Surgeon: Lollie Sails, MD;  Location: [facility] ENDOSCOPY;  Service: Endoscopy;  Laterality: N/A;  . EYE SURGERY     foreign body removed fr. eye, ? side   . FOOT FOREIGN BODY REMOVAL  1976   left foot -multiple pieces glass  . HEMORROIDECTOMY  1995  . KNEE ARTHROSCOPY Bilateral 01/17/2013   Procedure: ARTHROSCOPY KNEE BILATERAL WITH MEDIAL AND LATERAL MENISECTOMIES;  Surgeon: Lorn Junes, MD;  Location: [place];  Service: Orthopedics;  Laterality: Bilateral;  . KYPHOPLASTY N/A 07/23/2017   Procedure: KYPHOPLASTY- L1;  Surgeon: Hessie Knows, MD;  Location: [facility] ORS;  Service: Orthopedics;  Laterality: N/A;  . LUMBAR LAMINECTOMY N/A 11/05/2014   Procedure: LEFT L3-4 MICRODISCECTOMY;  Surgeon: Jessy Oto, MD;  Location: [place];  Service: Orthopedics;  Laterality: N/A;  . LUMBAR LAMINECTOMY/DECOMPRESSION MICRODISCECTOMY N/A 02/12/2015   Procedure: RE-DO LEFT L3-4 MICRODISCECTOMY;  Surgeon: Jessy Oto, MD;  Location: [place];  Service: Orthopedics;  Laterality: N/A;  . NASAL SEPTUM SURGERY  1995  . TONSILLECTOMY    . UPPER  GI ENDOSCOPY    . UVULOPALATOPHARYNGOPLASTY (UPPP)/TONSILLECTOMY/SEPTOPLASTY  1995   same time with septoplasty-ended up ICU almost trached    Social History   Socioeconomic History  . Marital status: Married    Spouse name: Mardene Celeste  . Number of children: 2  . Years of education: 19  . Highest education level: Not on file  Occupational History    Comment: register of deeds  Tobacco Use  . Smoking status: Former Smoker    Packs/day: 1.50    Years: 40.00    Pack years: 60.00    Types: Cigarettes    Quit date: 01/14/1996    Years since quitting: 24.9  . Smokeless tobacco: Never Used  Vaping Use  . Vaping Use: Never used  Substance and Sexual Activity  .  Alcohol use: No    Comment: wine in a month  . Drug use: No  . Sexual activity: Never  Other Topics Concern  . Not on file  Social History Narrative   Lives at home with wife, son   caffeine use - 2 cups coffee daily, sodas 1 a day, occas tea   Social Determinants of Health   Financial Resource Strain: Not on file  Food Insecurity: Not on file  Transportation Needs: Not on file  Physical Activity: Not on file  Stress: Not on file  Social Connections: Not on file  Intimate Partner Violence: Not on file     Family History  Problem Relation Age of Onset  . Cancer Mother   . Heart attack Father   . Heart disease Father   . Heart disease Other   . Arthritis Other   . Prostate cancer Neg Hx   . Kidney disease Neg Hx      Current Facility-Administered Medications:  .  0.9 %  sodium chloride infusion, , Intravenous, Continuous, Ivor Costa, MD .  albuterol (VENTOLIN HFA) 108 (90 Base) MCG/ACT inhaler 2 puff, 2 puff, Inhalation, Q4H PRN, Ivor Costa, MD .  dextromethorphan-guaiFENesin ([place] DM) 30-600 MG per 12 hr tablet 1 tablet, 1 tablet, Oral, BID PRN, Ivor Costa, MD .  hydrOXYzine (ATARAX/VISTARIL) tablet 10 mg, 10 mg, Oral, TID PRN, Ivor Costa, MD .  hydrOXYzine (VISTARIL) injection 25 mg, 25 mg,  Intramuscular, Q6H PRN, Ivor Costa, MD .  morphine 2 MG/ML injection 0.5 mg, 0.5 mg, Intravenous, Q4H PRN, Ivor Costa, MD .  oxyCODONE (Oxy IR/ROXICODONE) immediate release tablet 5 mg, 5 mg, Oral, Q6H PRN, Ivor Costa, MD  Current Outpatient Medications:  .  dutasteride (AVODART) 0.5 MG capsule, TAKE 1 CAPSULE BY MOUTH EVERY DAY, Disp: 90 capsule, Rfl: 3 .  acetaminophen (TYLENOL) 500 MG tablet, Take 1,000 mg by mouth every 6 (six) hours as needed for moderate pain. , Disp: , Rfl:  .  ADVAIR HFA 096-04 MCG/ACT inhaler, INHALE TWO PUFFS TWICE A DAY, Disp: 24 g, Rfl: 6 .  albuterol (VENTOLIN HFA) 108 (90 Base) MCG/ACT inhaler, PLACE ONE INHALATION TWICE DAILY, Disp: 54 g, Rfl: 6 .  amoxicillin (AMOXIL) 500 MG capsule, Take 1 capsule (500 mg total) by mouth 3 (three) times daily., Disp: 42 capsule, Rfl: 0 .  azithromycin (ZITHROMAX) 250 MG tablet, 2 tab po daily  For 3 days, Disp: 6 tablet, Rfl: 0 .  Cholecalciferol (VITAMIN D3 PO), Take 1 tablet by mouth daily., Disp: , Rfl:  .  clopidogrel (PLAVIX) 75 MG tablet, Take 75 mg by mouth daily. , Disp: , Rfl:  .  Coenzyme Q10 (CO Q-10) 100 MG CAPS, Take 100 mg by mouth daily. , Disp: , Rfl:  .  donepezil (ARICEPT) 10 MG tablet, Take 10 mg by mouth at bedtime., Disp: , Rfl:  .  EPINEPHrine 0.3 mg/0.3 mL IJ SOAJ injection, Inject 0.3 mg into the muscle as needed for anaphylaxis. , Disp: , Rfl:  .  fluticasone (FLONASE) 50 MCG/ACT nasal spray, TAKE 2 SPRAYS INTO EACH NOSTRIL EVERY DAY, Disp: 16 g, Rfl: 5 .  furosemide (LASIX) 40 MG tablet, TAKE ONE TABLET BY MOUTH EVERY DAY, Disp: 90 tablet, Rfl: 1 .  glucosamine-chondroitin 500-400 MG tablet, Take 1 tablet by mouth 3 (three) times daily., Disp: , Rfl:  .  hydrOXYzine (ATARAX/VISTARIL) 10 MG tablet, Take 20 mg by mouth at bedtime. , Disp: , Rfl:  .  ketoconazole (NIZORAL) 2 %  shampoo, Apply 1 application topically 2 (two) times a week. Apply to scalp twice weekly leave in for 5 to 10 mins., Disp: 120 mL,  Rfl: 11 .  levothyroxine (SYNTHROID) 50 MCG tablet, TAKE 1 TABLET EVERY DAY ON EMPTY STOMACHWITH A GLASS OF WATER AT LEAST 30-60 MINBEFORE BREAKFAST, Disp: 90 tablet, Rfl: 2 .  lidocaine (LIDODERM) 5 %, Place 1 patch onto the skin daily. , Disp: , Rfl:  .  Melatonin 3 MG TABS, Take 1 tablet by mouth at bedtime., Disp: , Rfl:  .  Methen-Hyosc-Meth Blue-Na Phos (UROGESIC-BLUE) 81.6 MG TABS, 1 tab every 8 hours as needed for burning, Disp: 20 tablet, Rfl: 0 .  metroNIDAZOLE (FLAGYL) 500 MG tablet, Take 1 tablet (500 mg total) by mouth 3 (three) times daily., Disp: 42 tablet, Rfl: 0 .  mirabegron ER (MYRBETRIQ) 25 MG TB24 tablet, Take 25 mg by mouth daily., Disp: , Rfl:  .  phenazopyridine (PYRIDIUM) 100 MG tablet, Take 1 tablet (100 mg total) by mouth 3 (three) times daily as needed for pain., Disp: 10 tablet, Rfl: 0 .  RA KRILL OIL 500 MG CAPS, Take 500 mg by mouth daily. , Disp: , Rfl:  .  SYMBICORT 160-4.5 MCG/ACT inhaler, USE 2 PUFFS INTO LUNGS TWICE DAILY, Disp: 20.4 g, Rfl: 6 .  tamsulosin (FLOMAX) 0.4 MG CAPS capsule, TAKE 1 CAPSULE BY MOUTH EVERY DAY, Disp: 90 capsule, Rfl: 3 .  tiotropium (SPIRIVA) 18 MCG inhalation capsule, Place 1 capsule (18 mcg total) into inhaler and inhale daily., Disp: 90 capsule, Rfl: 1 .  TOLTERODINE TARTRATE PO, Take 4 mg by mouth daily., Disp: , Rfl:  .  topiramate (TOPAMAX) 50 MG tablet, Take 50 mg by mouth 2 (two) times daily., Disp: , Rfl:  .  TOVIAZ 8 MG TB24 tablet, Take 8 mg by mouth daily., Disp: , Rfl:  .  Turmeric (QC TUMERIC COMPLEX) 500 MG CAPS, Take by mouth., Disp: , Rfl:  .  vitamin B-12 (CYANOCOBALAMIN) 1000 MCG tablet, Take 1,000 mcg by mouth daily., Disp: , Rfl:    Physical exam:  Vitals:   01/03/21 1245 01/03/21 1359 01/03/21 1400 01/03/21 1530  BP: 122/63 128/83 128/83 113/66  Pulse: 75 78 78 77  Resp: 17 18 15 13   Temp:      TempSrc:      SpO2: 98% 95% 92% 94%  Weight:      Height:       Physical Exam Constitutional:      General:  He is not in acute distress. Eyes:     Extraocular Movements: EOM normal.  Cardiovascular:     Rate and Rhythm: Normal rate and regular rhythm.     Heart sounds: Normal heart sounds.  Pulmonary:     Effort: Pulmonary effort is normal.     Breath sounds: Normal breath sounds.  Abdominal:     Palpations: Abdomen is soft.     Comments: Mild ttp ruq  Skin:    General: Skin is warm and dry.  Neurological:     Mental Status: He is alert and oriented to person, place, and time.        CMP Latest Ref Rng & Units 01/03/2021  Glucose 70 - 99 mg/dL 93  BUN 8 - 23 mg/dL 10  Creatinine 0.61 - 1.24 mg/dL 0.90  Sodium 135 - 145 mmol/L 135  Potassium 3.5 - 5.1 mmol/L 3.8  Chloride 98 - 111 mmol/L 96(L)  CO2 22 - 32 mmol/L 26  Calcium 8.9 -  10.3 mg/dL 9.3  Total Protein 6.5 - 8.1 g/dL 7.6  Total Bilirubin 0.3 - 1.2 mg/dL 3.3(H)  Alkaline Phos 38 - 126 U/L 616(H)  AST 15 - 41 U/L 261(H)  ALT 0 - 44 U/L 102(H)   CBC Latest Ref Rng & Units 01/03/2021  WBC 4.0 - 10.5 K/uL 9.1  Hemoglobin 13.0 - 17.0 g/dL 15.2  Hematocrit 39.0 - 52.0 % 47.0  Platelets 150 - 400 K/uL 230    @IMAGES @  CT ABDOMEN PELVIS W CONTRAST  Result Date: 01/03/2021 CLINICAL DATA:  Right lower quadrant abdominal pain. EXAM: CT ABDOMEN AND PELVIS WITH CONTRAST TECHNIQUE: Multidetector CT imaging of the abdomen and pelvis was performed using the standard protocol following bolus administration of intravenous contrast. CONTRAST:  122mL OMNIPAQUE IOHEXOL 300 MG/ML  SOLN COMPARISON:  October 29, 2014. FINDINGS: Lower chest: 5.6 x 4.4 cm lobulated mass is noted in the left lower lobe laterally concerning for malignancy. Hepatobiliary: No gallstones or biliary dilatation is noted. Multiple rounded low densities are noted throughout the liver of varying sizes consistent with diffuse hepatic metastases. The largest measures 7.1 x 4.3 cm in the left hepatic lobe. Pancreas: Unremarkable. No pancreatic ductal dilatation or surrounding  inflammatory changes. Spleen: Normal in size without focal abnormality. Adrenals/Urinary Tract: Adrenal glands are unremarkable. Kidneys are normal, without renal calculi, focal lesion, or hydronephrosis. Bladder is unremarkable. Stomach/Bowel: Stomach appears normal. Status post appendectomy. There is no evidence of bowel obstruction or inflammation. Vascular/Lymphatic: Atherosclerosis of abdominal aorta is noted without aneurysm or dissection. 2.4 cm left periaortic lymph node is noted concerning for metastatic disease. Reproductive: Prostate is unremarkable. Other: No abdominal wall hernia or abnormality. No abdominopelvic ascites. Musculoskeletal: No acute or significant osseous findings. 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 IMPRESSION: 1. 5.6 x 4.4 cm lobulated mass is noted in the left lower lobe laterally concerning for malignancy. 2. Multiple rounded low densities are noted throughout the liver of varying sizes consistent with diffuse hepatic metastases. The largest measures 7.1 x 4.3 cm in the left hepatic lobe. 3. 2.4 cm left periaortic lymph node is noted concerning for metastatic disease. 4. Aortic atherosclerosis. Aortic Atherosclerosis (ICD10-I70.0). Electronically Signed   By: Marijo Conception M.D.   On: 01/03/2021 13:51    Assessment and plan- Patient is a 80 y.o. male admitted for abdominal pain and weakness found to have left lower lobe lung mass and multiple sites of liver metastases  I have discussed CT abdomen pelvis findings with the patient.  Overall findings concerning for metastatic lung cancer with liver metastases.  He will need biopsy for tissue diagnosis.  If patient remains in the hospital over the weekend he can be kept n.p.o. after midnight for an ultrasound-guided liver biopsy on Monday morning. Discussed he has stage 4 disease. Treatments will be  palliative not curative.   He will need a PET CT scan as an outpatient which I will arrange.I will order mri brain for tomorrow  Neoplasm related pain: Recommend starting him on as needed oxycodone 5 mg every 4 hours as needed which can be continued upon discharge and I will further titrated as an outpatient.  Recommend starting him on bowel medications to prevent opioid-induced constipation.   Total face to face encounter time for this patient visit was 79min with >50% of time spent in counseling and coordination of care.    Visit  Diagnosis 1. Weakness   2. Liver mass   3. Lung mass                                                                                 4. Neoplasm related pain                                                                               5. Goals of care counselling  Dr. Randa Evens, MD, MPH [facility] [phone] 01/03/2021 9:32 PM

## 2021-01-03 NOTE — ED Triage Notes (Signed)
Pt brought in via EMS from home.  Pt reports generalized weakness x 2 weeks.  Pt also reports frequent hx of UTI, reports of malodorous urine.  Per EMS, VS stable, CBG 101

## 2021-01-03 NOTE — H&P (Signed)
History and Physical    Eddie Carter:[id number] DOB: 09-09-1941 DOA: 01/03/2021  Referring MD/NP/PA:   PCP: [name], MD   Patient coming from:  The patient is coming from home.  At baseline, pt is independent for most of ADL.        Chief Complaint: Abdominal pain, chest pain, generalized weakness  HPI: Eddie Carter is a 80 y.o. male with medical history significant of diet-controlled diabetes, COPD, asthma, stroke, GERD, hypothyroidism, depression, anxiety, OSA on CPAP, IBS, urethral stricture, diverticulitis, GI bleeding, BPH, interstitial lung disease, dementia, who presents with abdominal pain, chest pain, generalized weakness.  Patient states that he has been having abdominal pain for more than 3 weeks.  The abdominal pain is located in the right side of abdomen from right upper to right lower quadrant, associated with nausea, no vomiting or diarrhea.  Patient has poor appetite and decreased oral intake. He also reports right-sided chest pain, which is pleuritic, 5 out of 10 severity, sharp, aggravated by deep breath.  Associated with mild shortness of breath and mild dry cough.  No fever or chills.  No symptoms of UTI or unilateral weakness.  Does not have recent fall, no head injury, no dark stool or rectal bleeding.  ED Course: pt was found to have WBC 9.1, lactic acid 3.4, 2.5, pending urinalysis, lipase 45, abnormal liver function (ALP 616, AST 261, ALT 102, total bilirubin 3.3), electrolytes renal function okay, pending COVID-19 PCR, temperature normal, blood pressure 128/83, heart rate 78, RR 18, oxygen saturation 95% on room air.  CT abdomen/pelvis showed left lower lobe mass with liver metastasis.  Patient is admitted to [place] bed as inpatient.  Dr. Janese Banks of oncology is consulted.  CT-adb/pelvis with contrast 1. 5.6 x 4.4 cm lobulated mass is noted in the left lower lobe laterally concerning for malignancy. 2. Multiple rounded low densities are noted throughout the  liver of varying sizes consistent with diffuse hepatic metastases. The largest measures 7.1 x 4.3 cm in the left hepatic lobe. 3. 2.4 cm left periaortic lymph node is noted concerning for metastatic disease. 4. Aortic atherosclerosis.   Review of Systems:   General: no fevers, chills, no body weight gain, has poor appetite, has fatigue HEENT: no blurry vision, hearing changes or sore throat Respiratory: has dyspnea, coughing, no wheezing CV: has chest pain, no palpitations GI: has nausea, abdominal pain, no diarrhea, constipationvomiting, GU: no dysuria, burning on urination, increased urinary frequency, hematuria  Ext: no leg edema Neuro: no unilateral weakness, numbness, or tingling, no vision change or hearing loss Skin: no rash, no skin tear. MSK: No muscle spasm, no deformity, no limitation of range of movement in spin Heme: No easy bruising.  Travel history: No recent long distant travel.  Allergy:  Allergies  Allergen Reactions  . Asa [Aspirin] Shortness Of Breath  . Bee Venom Anaphylaxis  . Nsaids Other (See Comments)    Makes him bleed.  . Tolmetin Other (See Comments)    Makes him bleed.    Past Medical History:  Diagnosis Date  . Arthritis   . Asthma   . BPH (benign prostatic hyperplasia)   . 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 Complication of anesthesia    after septoplasty and uvulectomy-was in icu- [facility] - 10 yrs. ago  . COPD (chronic obstructive pulmonary disease) ([place])   . Depression   . Diabetes mellitus without complication ([place])   . Diverticulitis   . ED (erectile  dysfunction)   . Full dentures   . GERD (gastroesophageal reflux disease)    no meds  . Headache    difficulty with headaches- 1950's , again in 1967, 2008- again problems with migrraines   . History of GI bleed   . History of multiple pulmonary nodules   . History of shingles   . History of urethral stricture   . HOH (hard of hearing)   . HOH (hard of hearing)   .  Hypogonadism in male   . IBS (irritable bowel syndrome)   . Joint pain   . Medial meniscus, posterior horn derangement    left knee  . Melanosis coli   . Obesity   . Pneumonia 2014   [facility]  . Shortness of breath   . Skin cancer   . Sleep apnea    uses a cpap, most nights   . Stroke [facility]) 1995   some speech and thought processes slow  . Traumatic tear of lateral meniscus of right knee    right knee  . Urinary frequency   . Urinary incontinence     Past Surgical History:  Procedure Laterality Date  . APPENDECTOMY  1963  . BACK SURGERY    . CARPAL TUNNEL RELEASE     left  . CATARACT EXTRACTION W/PHACO Left 09/13/2018   Procedure: CATARACT EXTRACTION PHACO AND INTRAOCULAR LENS PLACEMENT (IOC);  Surgeon: Birder Robson, MD;  Location: [facility] ORS;  Service: Ophthalmology;  Laterality: Left;  [place]  00:42 CDE 6.73 Fluid pack lot #[id number]  . CATARACT EXTRACTION W/PHACO Right 11/23/2019   Procedure: CATARACT EXTRACTION PHACO AND INTRAOCULAR LENS PLACEMENT ([place]) RIGHT;  Surgeon: Birder Robson, MD;  Location: [facility] ORS;  Service: Ophthalmology;  Laterality: Right;  [place] 01:10.1 CDE 10.25 Fluid Pack Lot # [id number]  . COLONOSCOPY    . COLONOSCOPY WITH PROPOFOL N/A 12/07/2016   Procedure: COLONOSCOPY WITH PROPOFOL;  Surgeon: Lollie Sails, MD;  Location: [facility] ENDOSCOPY;  Service: Endoscopy;  Laterality: N/A;  . COLONOSCOPY WITH PROPOFOL N/A 06/26/2019   Procedure: COLONOSCOPY WITH PROPOFOL;  Surgeon: Lollie Sails, MD;  Location: [facility] ENDOSCOPY;  Service: Endoscopy;  Laterality: N/A;  . EYE SURGERY     foreign body removed fr. eye, ? side   . FOOT FOREIGN BODY REMOVAL  1976   left foot -multiple pieces glass  . HEMORROIDECTOMY  1995  . KNEE ARTHROSCOPY Bilateral 01/17/2013   Procedure: ARTHROSCOPY KNEE BILATERAL WITH MEDIAL AND LATERAL MENISECTOMIES;  Surgeon: Lorn Junes, MD;  Location: [place];  Service: Orthopedics;  Laterality: Bilateral;  .  KYPHOPLASTY N/A 07/23/2017   Procedure: KYPHOPLASTY- L1;  Surgeon: Hessie Knows, MD;  Location: [facility] ORS;  Service: Orthopedics;  Laterality: N/A;  . LUMBAR LAMINECTOMY N/A 11/05/2014   Procedure: LEFT L3-4 MICRODISCECTOMY;  Surgeon: Jessy Oto, MD;  Location: [place];  Service: Orthopedics;  Laterality: N/A;  . LUMBAR LAMINECTOMY/DECOMPRESSION MICRODISCECTOMY N/A 02/12/2015   Procedure: RE-DO LEFT L3-4 MICRODISCECTOMY;  Surgeon: Jessy Oto, MD;  Location: [place];  Service: Orthopedics;  Laterality: N/A;  . NASAL SEPTUM SURGERY  1995  . TONSILLECTOMY    . UPPER GI ENDOSCOPY    . UVULOPALATOPHARYNGOPLASTY (UPPP)/TONSILLECTOMY/SEPTOPLASTY  1995   same time with septoplasty-ended up ICU almost trached    Social History:  reports that he quit smoking about 24 years ago. His smoking use included cigarettes. He has a 60.00 pack-year smoking history. He has never used smokeless tobacco. He reports that he does not  drink alcohol and does not use drugs.  Family History:  Family History  Problem Relation Age of Onset  . Cancer Mother   . Heart attack Father   . Heart disease Father   . Heart disease Other   . Arthritis Other   . Prostate cancer Neg Hx   . Kidney disease Neg Hx      Prior to Admission medications   Medication Sig Start Date End Date Taking? Authorizing Provider  dutasteride (AVODART) 0.5 MG capsule TAKE 1 CAPSULE BY MOUTH EVERY DAY 12/13/20   McGowan, Larene Beach A, PA-C  acetaminophen (TYLENOL) 500 MG tablet Take 1,000 mg by mouth every 6 (six) hours as needed for moderate pain.     [provider]  ADVAIR HFA [phone] MCG/ACT inhaler INHALE TWO PUFFS TWICE A DAY 09/23/20   [name], MD  albuterol (VENTOLIN HFA) 108 (90 Base) MCG/ACT inhaler PLACE ONE INHALATION TWICE DAILY 09/23/20   [name], MD  amoxicillin (AMOXIL) 500 MG capsule Take 1 capsule (500 mg total) by mouth 3 (three) times daily. 12/19/20   Beckie Salts, FNP  azithromycin (ZITHROMAX) 250 MG tablet  2 tab po daily  For 3 days 12/02/20   [name], MD  Cholecalciferol (VITAMIN D3 PO) Take 1 tablet by mouth daily.    [provider]  clopidogrel (PLAVIX) 75 MG tablet Take 75 mg by mouth daily.     [provider]  Coenzyme Q10 (CO Q-10) 100 MG CAPS Take 100 mg by mouth daily.     [provider]  donepezil (ARICEPT) 10 MG tablet Take 10 mg by mouth at bedtime.    [provider]  EPINEPHrine 0.3 mg/0.3 mL IJ SOAJ injection Inject 0.3 mg into the muscle as needed for anaphylaxis.  08/09/18   [provider]  fluticasone (FLONASE) 50 MCG/ACT nasal spray TAKE 2 SPRAYS INTO EACH NOSTRIL EVERY DAY 05/30/20   [name], MD  furosemide (LASIX) 40 MG tablet TAKE ONE TABLET BY MOUTH EVERY DAY 11/25/20   [name], MD  glucosamine-chondroitin 500-400 MG tablet Take 1 tablet by mouth 3 (three) times daily.    [provider]  hydrOXYzine (ATARAX/VISTARIL) 10 MG tablet Take 20 mg by mouth at bedtime.  06/10/13   [provider]  ketoconazole (NIZORAL) 2 % shampoo Apply 1 application topically 2 (two) times a week. Apply to scalp twice weekly leave in for 5 to 10 mins. 04/04/20   Ralene Bathe, MD  levothyroxine (SYNTHROID) 50 MCG tablet TAKE 1 TABLET EVERY DAY ON EMPTY STOMACHWITH A GLASS OF WATER AT LEAST 30-60 MINBEFORE BREAKFAST 12/13/20   Beckie Salts, FNP  lidocaine (LIDODERM) 5 % Place 1 patch onto the skin daily.  11/08/18   [provider]  Melatonin 3 MG TABS Take 1 tablet by mouth at bedtime.    [provider]  Methen-Hyosc-Meth Blue-Na Phos (UROGESIC-BLUE) 81.6 MG TABS 1 tab every 8 hours as needed for burning 07/12/20   Stoioff, Ronda Fairly, MD  metroNIDAZOLE (FLAGYL) 500 MG tablet Take 1 tablet (500 mg total) by mouth 3 (three) times daily. 12/19/20   Beckie Salts, FNP  mirabegron ER (MYRBETRIQ) 25 MG TB24 tablet Take 25 mg by mouth daily.    [provider]  phenazopyridine (PYRIDIUM) 100 MG tablet  Take 1 tablet (100 mg total) by mouth 3 (three) times daily as needed for pain. 06/25/20   McGowan, Larene Beach A, PA-C  RA KRILL OIL 500 MG CAPS Take 500 mg by  mouth daily.     [provider]  SYMBICORT 160-4.5 MCG/ACT inhaler USE 2 PUFFS INTO LUNGS TWICE DAILY 09/23/20   [name], MD  tamsulosin (FLOMAX) 0.4 MG CAPS capsule TAKE 1 CAPSULE BY MOUTH EVERY DAY 03/25/20   McGowan, Larene Beach A, PA-C  tiotropium (SPIRIVA) 18 MCG inhalation capsule Place 1 capsule (18 mcg total) into inhaler and inhale daily. 08/20/14   Juanito Doom, MD  TOLTERODINE TARTRATE PO Take 4 mg by mouth daily.    [provider]  topiramate (TOPAMAX) 50 MG tablet Take 50 mg by mouth 2 (two) times daily.    [provider]  TOVIAZ 8 MG TB24 tablet Take 8 mg by mouth daily. 09/21/20   [provider]  Turmeric (QC TUMERIC COMPLEX) 500 MG CAPS Take by mouth.    [provider]  vitamin B-12 (CYANOCOBALAMIN) 1000 MCG tablet Take 1,000 mcg by mouth daily.    [provider]    Physical Exam: Vitals:   01/03/21 1245 01/03/21 1359 01/03/21 1400 01/03/21 1530  BP: 122/63 128/83 128/83 113/66  Pulse: 75 78 78 77  Resp: 17 18 15 13   Temp:      TempSrc:      SpO2: 98% 95% 92% 94%  Weight:      Height:       General: Not in acute distress HEENT:       Eyes: PERRL, EOMI, no scleral icterus.       ENT: No discharge from the ears and nose, no pharynx injection, no tonsillar enlargement.        Neck: No JVD, no bruit, no mass felt. Heme: No neck lymph node enlargement. Cardiac: S1/S2, RRR, No murmurs, No gallops or rubs. Respiratory: No rales, wheezing, rhonchi or rubs. GI: Soft, nondistended, has tenderness in right sided of abdomen, no rebound pain, no organomegaly, BS present. GU: No hematuria Ext: No pitting leg edema bilaterally. 1+DP/PT pulse bilaterally. Musculoskeletal: No joint deformities, No joint redness or warmth, no limitation of ROM in spin. Skin: No  rashes.  Neuro: Alert, oriented X3, cranial nerves II-XII grossly intact, moves all extremities normally.  Psych: Patient is not psychotic, no suicidal or hemocidal ideation.  Labs on Admission: I have personally reviewed following labs and imaging studies  CBC: Recent Labs  Lab 01/03/21 1111  WBC 9.1  NEUTROABS 6.7  HGB 15.2  HCT 47.0  MCV 91.1  PLT 902   Basic Metabolic Panel: Recent Labs  Lab 01/03/21 1111  NA 135  K 3.8  CL 96*  CO2 26  GLUCOSE 93  BUN 10  CREATININE 0.90  CALCIUM 9.3   GFR: Estimated Creatinine Clearance: 89.7 mL/min (by C-G formula based on SCr of 0.9 mg/dL). Liver Function Tests: Recent Labs  Lab 01/03/21 1111  AST 261*  ALT 102*  ALKPHOS 616*  BILITOT 3.3*  PROT 7.6  ALBUMIN 3.4*   Recent Labs  Lab 01/03/21 1111  LIPASE 45   No results for input(s): AMMONIA in the last 168 hours. Coagulation Profile: No results for input(s): INR, PROTIME in the last 168 hours. Cardiac Enzymes: No results for input(s): CKTOTAL, CKMB, CKMBINDEX, TROPONINI in the last 168 hours. BNP (last 3 results) No results for input(s): PROBNP in the last 8760 hours. HbA1C: No results for input(s): HGBA1C in the last 72 hours. CBG: No results for input(s): GLUCAP in the last 168 hours. Lipid Profile: No results for input(s): CHOL, HDL, LDLCALC, TRIG, CHOLHDL, LDLDIRECT in the last 72 hours.  Thyroid Function Tests: No results for input(s): TSH, T4TOTAL, FREET4, T3FREE, THYROIDAB in the last 72 hours. Anemia Panel: No results for input(s): VITAMINB12, FOLATE, FERRITIN, TIBC, IRON, RETICCTPCT in the last 72 hours. Urine analysis:    Component Value Date/Time   COLORURINE AMBER (A) 01/03/2021 1623   APPEARANCEUR CLEAR (A) 01/03/2021 1623   APPEARANCEUR Cloudy (A) 07/12/2020 1323   LABSPEC 1.043 (H) 01/03/2021 1623   LABSPEC 1.012 07/25/2013 1646   PHURINE 5.0 01/03/2021 1623   GLUCOSEU NEGATIVE 01/03/2021 1623   GLUCOSEU Negative 07/25/2013 1646   HGBUR  NEGATIVE 01/03/2021 1623   BILIRUBINUR NEGATIVE 01/03/2021 1623   BILIRUBINUR Negative 07/12/2020 1323   BILIRUBINUR Negative 07/25/2013 1646   KETONESUR NEGATIVE 01/03/2021 1623   PROTEINUR NEGATIVE 01/03/2021 1623   NITRITE NEGATIVE 01/03/2021 1623   LEUKOCYTESUR NEGATIVE 01/03/2021 1623   LEUKOCYTESUR Negative 07/25/2013 1646   Sepsis Labs: @LABRCNTIP (procalcitonin:4,lacticidven:4) ) Recent Results (from the past 240 hour(s))  Resp Panel by RT-PCR (Flu A&B, Covid) Nasopharyngeal Swab     Status: None   Collection Time: 01/03/21  3:55 PM   Specimen: Nasopharyngeal Swab; Nasopharyngeal(NP) swabs in vial transport medium  Result Value Ref Range Status   SARS Coronavirus 2 by RT PCR NEGATIVE NEGATIVE Final    Comment: (NOTE) SARS-CoV-2 target nucleic acids are NOT DETECTED.  The SARS-CoV-2 RNA is generally detectable in upper respiratory specimens during the acute phase of infection. The lowest concentration of SARS-CoV-2 viral copies this assay can detect is 138 copies/mL. A negative result does not preclude SARS-Cov-2 infection and should not be used as the sole basis for treatment or other patient management decisions. A negative result may occur with  improper specimen collection/handling, submission of specimen other than nasopharyngeal swab, presence of viral mutation(s) within the areas targeted by this assay, and inadequate number of viral copies(<138 copies/mL). A negative result must be combined with clinical observations, patient history, and epidemiological information. The expected result is Negative.  Fact Sheet for Patients:  EntrepreneurPulse.com.au  Fact Sheet for Healthcare Providers:  IncredibleEmployment.be  This test is no t yet approved or cleared by the [place] FDA and  has been authorized for detection and/or diagnosis of SARS-CoV-2 by FDA under an Emergency Use Authorization (EUA). This EUA will remain  in  effect (meaning this test can be used) for the duration of the COVID-19 declaration under Section 564(b)(1) of the Act, 21 U.S.C.section 360bbb-3(b)(1), unless the authorization is terminated  or revoked sooner.       Influenza A by PCR NEGATIVE NEGATIVE Final   Influenza B by PCR NEGATIVE NEGATIVE Final    Comment: (NOTE) The Xpert Xpress SARS-CoV-2/FLU/RSV plus assay is intended as an aid in the diagnosis of influenza from Nasopharyngeal swab specimens and should not be used as a sole basis for treatment. Nasal washings and aspirates are unacceptable for Xpert Xpress SARS-CoV-2/FLU/RSV testing.  Fact Sheet for Patients: EntrepreneurPulse.com.au  Fact Sheet for Healthcare Providers: IncredibleEmployment.be  This test is not yet approved or cleared by the [place] FDA and has been authorized for detection and/or diagnosis of SARS-CoV-2 by FDA under an Emergency Use Authorization (EUA). This EUA will remain in effect (meaning this test can be used) for the duration of the COVID-19 declaration under Section 564(b)(1) of the Act, 21 U.S.C. section 360bbb-3(b)(1), unless the authorization is terminated or revoked.  Performed at [facility], [address] [place], [place] [address]      Radiological Exams on Admission: [place]  Result Date: 01/03/2021 CLINICAL DATA:  Right lower quadrant abdominal pain. EXAM: CT ABDOMEN AND PELVIS WITH CONTRAST TECHNIQUE: Multidetector CT imaging of the abdomen and pelvis was performed using the standard protocol following bolus administration of intravenous contrast. CONTRAST:  145mL OMNIPAQUE IOHEXOL 300 MG/ML  SOLN COMPARISON:  October 29, 2014. FINDINGS: Lower chest: 5.6 x 4.4 cm lobulated mass is noted in the left lower lobe laterally concerning for malignancy. Hepatobiliary: No gallstones or biliary dilatation is noted. Multiple rounded low densities are noted throughout  the liver of varying sizes consistent with diffuse hepatic metastases. The largest measures 7.1 x 4.3 cm in the left hepatic lobe. Pancreas: Unremarkable. No pancreatic ductal dilatation or surrounding inflammatory changes. Spleen: Normal in size without focal abnormality. Adrenals/Urinary Tract: Adrenal glands are unremarkable. Kidneys are normal, without renal calculi, focal lesion, or hydronephrosis. Bladder is unremarkable. Stomach/Bowel: Stomach appears normal. Status post appendectomy. There is no evidence of bowel obstruction or inflammation. Vascular/Lymphatic: Atherosclerosis of abdominal aorta is noted without aneurysm or dissection. 2.4 cm left periaortic lymph node is noted concerning for metastatic disease. Reproductive: Prostate is unremarkable. Other: No abdominal wall hernia or abnormality. No abdominopelvic ascites. Musculoskeletal: No acute or significant osseous findings. 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 IMPRESSION: 1. 5.6 x 4.4 cm lobulated mass is noted in the left lower lobe laterally concerning for malignancy. 2. Multiple rounded low densities are noted throughout the liver of varying sizes consistent with diffuse hepatic metastases. The largest measures 7.1 x 4.3 cm in the left hepatic lobe. 3. 2.4 cm left periaortic lymph node is noted concerning for metastatic disease. 4. Aortic atherosclerosis. Aortic Atherosclerosis (ICD10-I70.0). Electronically Signed   By: Marijo Conception M.D.   On: 01/03/2021 13:51     EKG: I have personally reviewed.  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  Dr. Janese Banks of oncology is consulted.  -Admitted to MedSurg bed as inpatient -N.p.o. 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  Patient not taking medications. -check CBG qAM  Dementia without behavioral disturbance (HCC) -Donepezil  Abnormal LFTs: Most likely due to metastasized to liver disease -Avoid using Tylenol  Hypothyroidism -Synthroid  Elevated lactic acid level: Acid 3.4, 2.5, likely due to dehydration.  No signs of infection. -IV fluid: 500 cc normal saline and then 75 cc/h  Chest pain: Patient has a pleuritic chest pain.  Has shortness breath, but no oxygen desaturation.  We need to rule out PE. Get D-dimer, if D-dimer is positive. will start IV heparin empirically.  Since patient  just received IV contrast, cannot do CT angiogram tonight.  Because of possible malignant lung mass, VQ scan is not a good choice.   -Follow-up D-dimer -Troponin x3 -Check A1c, FLP -As needed morphine for severe pain  Depression and anxiety: Stable, no suicidal or homicidal ideations. -Continue home medications     DVT ppx: SQ Heparin    Code Status:  DNR (I discussed with patient and explained the meaning of CODE STATUS.  Patient is very sure that he wants to be DNR)  Family Communication: there is no contact number listed in [place].  Patient does  not want me to call her sister.  He states that it is okay to call his son who is working in our hospital as a Marine scientist, but pt could not remember his son's phone number.  Disposition Plan:  Anticipate discharge back to previous environment Consults called:  Dr. Janese Banks of oncology Admission status and Level of care: Med-Surg:   as inpt        Status is: Inpatient  Remains inpatient appropriate because:Inpatient level of care appropriate due to severity of illness   Dispo: The patient is from: Home              Anticipated d/c is to: Home              Anticipated d/c date is: 2 days              Patient currently is not medically stable to d/c.   Difficult to place patient No            Date of Service 01/03/2021    [facility]   If 7PM-7AM, please contact night-coverage [email or web address] 01/03/2021, 4:53 PM

## 2021-01-03 NOTE — ED Notes (Signed)
Critical lactic acid 3.4 reported to Dr. Jacqualine Code

## 2021-01-03 NOTE — Plan of Care (Signed)
  Problem: Education: Goal: Knowledge of General Education information will improve Description: Including pain rating scale, medication(s)/side effects and non-pharmacologic comfort measures Outcome: Progressing   Problem: Pain Managment: Goal: General experience of comfort will improve Outcome: Progressing   Problem: Safety: Goal: Ability to remain free from injury will improve Outcome: Progressing   

## 2021-01-03 NOTE — Consult Note (Addendum)
ANTICOAGULATION CONSULT NOTE - Consult  Pharmacy Consult for Heparin gtt Indication: pulmonary embolus  Allergies  Allergen Reactions  . Asa [Aspirin] Shortness Of Breath  . Bee Venom Anaphylaxis  . Nsaids Other (See Comments)    Makes him bleed.  . Tolmetin Other (See Comments)    Makes him bleed.    Patient Measurements: Height: 6' (182.9 cm) Weight: 117.9 kg (260 lb) IBW/kg (Calculated) : 77.6 Heparin Dosing Weight: 103 kg   Vital Signs: Temp: 98.6 F (37 C) (02/11 2029) Temp Source: Oral (02/11 2029) BP: 114/55 (02/11 2029) Pulse Rate: 84 (02/11 2029)  Labs: Recent Labs    01/03/21 1111 01/03/21 1849  HGB 15.2  --   HCT 47.0  --   PLT 230  --   APTT  --  25  LABPROT  --  14.3  INR  --  1.2  CREATININE 0.90  --   TROPONINIHS  --  11    Estimated Creatinine Clearance: 89.7 mL/min (by C-G formula based on SCr of 0.9 mg/dL).  Heparin Dosing Weight: 103 kg   Medications:  Allergic to ASA and NSAIDs PTA: Plavix 75mg  QD Inpatient: Heparin 5k units SQ Q8h --> heparin gtt  Assessment: 80yo Male with h/o diet-controlled DM, COPD/Asthma, stroke, GERD, hypothyroidism, MDD/Anxiety, OSA (on CPAP), IBS, urethral stricture, diverticulitis, GI bleeding, BPH, interstitial lung dz, dementia presenting with abd'l pain x3wks, CP, and generalized weakness. C/f malignant lung mass; holding plavix for biopsy. Pharmacy consulted for mgmt of hep gtt started empirically for concern of PE.  Baseline: aPTT 25s; PT/INR 14.3/1.2; H/H 15.2/47; Plts 230; trop 12  Date Time HL Rate/Comment    Goal of Therapy:  Heparin level 0.3-0.7 units/ml Monitor platelets by anticoagulation protocol: Yes   Plan:  Give 6100 units bolus x 1 Start heparin infusion at 1700 units/hr Check anti-Xa level in 8 hours and daily while on heparin Continue to monitor H&H and platelets  Lorna Dibble 01/03/2021,8:58 PM

## 2021-01-03 NOTE — ED Provider Notes (Signed)
[facility] Emergency Department Provider Note   ____________________________________________   Event Date/Time   First MD Initiated Contact with Patient 01/03/21 1111     (approximate)  I have reviewed the triage vital signs and the nursing notes.   HISTORY  Chief Complaint Weakness (Weakness x 2 weeks, possible UTI, malodorous urine)    HPI Eddie Carter is a 80 y.o. male for evaluation of abdominal pain and weakness  For 2 weeks now has been experiencing a pain service right mid to right lower abdomen.  Is been accompanied by nausea decreased appetite and increasing pain.  No chest pain or trouble breathing.  No cough.  No fevers or chills.  Pain seems to be gradually worsening to the point now that he is having severe difficulty getting up and about moving around and getting even to his wheelchair.  His attorney came up to have signed some paperwork with him today and found him to be in the bed unable to get himself up having urinated in the bed and very fatigued prompting call 911  Patient reports he seems to be getting worse on his doctor for the same a few days ago and there think he may have recurrence of an ulcer disease.  Past Medical History:  Diagnosis Date  . Arthritis   . Asthma   . BPH (benign prostatic hyperplasia)   . 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 Complication of anesthesia    after septoplasty and uvulectomy-was in icu- [facility] - 10 yrs. ago  . COPD (chronic obstructive pulmonary disease) ([place])   . Depression   . Diabetes mellitus without complication ([place])   . Diverticulitis   . ED (erectile dysfunction)   . Full dentures   . GERD (gastroesophageal reflux disease)    no meds  . Headache    difficulty with headaches- 1950's , again in 1967, 2008- again problems with migrraines   . History of GI bleed   . History of multiple pulmonary nodules   . History of shingles   . History of urethral stricture   . HOH  (hard of hearing)   . HOH (hard of hearing)   . Hypogonadism in male   . IBS (irritable bowel syndrome)   . Joint pain   . Medial meniscus, posterior horn derangement    left knee  . Melanosis coli   . Obesity   . Pneumonia 2014   [facility]  . Shortness of breath   . Skin cancer   . Sleep apnea    uses a cpap, most nights   . Stroke [facility]) 1995   some speech and thought processes slow  . Traumatic tear of lateral meniscus of right knee    right knee  . Urinary frequency   . Urinary incontinence     Patient Active Problem List   Diagnosis Date Noted  . Lung mass 01/03/2021  . Abnormal LFTs 01/03/2021  . Hypothyroidism 01/03/2021  . Elevated lactic acid level 01/03/2021  . 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 Generalized abdominal pain 12/19/2020  . Viral upper respiratory tract infection 12/02/2020  . Lymphedema 10/15/2020  . Benign prostatic hyperplasia 10/15/2020  . Recurrent headache 10/15/2020  . Hx of erectile dysfunction 10/15/2020  . Primary osteoarthritis of right knee 09/25/2020  . Tremor, essential 09/06/2020  . Joint pain 06/14/2020  . Morbid obesity ([place]) 06/14/2020  . Swelling of limb 06/14/2020  . Bilateral sacroiliitis ([place]) 05/15/2020  . Edema  of both lower legs 05/09/2020  . Spondylosis of lumbar region without myelopathy or radiculopathy 03/26/2020  . Pain due to onychomycosis of toenails of both feet 07/10/2019  . Coagulation defect ([place]) 07/10/2019  . Joint pain and swelling due to Lyme disease 11/04/2017  . Headache disorder 10/11/2017  . Chronic pain of right knee 10/11/2017  . Ataxia 09/01/2017  . Loss of memory 09/01/2017  . Physical deconditioning 09/01/2017  . Trochanteric bursitis of both hips 09/01/2017  . Meralgia paresthetica, left lower limb 09/01/2017  . Class 2 obesity with alveolar hypoventilation and body mass index (BMI) of 35.0 to 35.9 in adult ([place]) 08/10/2017  . Left hip pain 08/09/2017  . Pain in right leg 08/09/2017  . History  of headache 08/04/2017  . Dementia without behavioral disturbance ([place]) 08/04/2017  . H/O sleep apnea 08/04/2017  . Closed compression fracture of L1 lumbar vertebra 07/15/2017  . Back pain 07/15/2017  . Aspiration into airway 07/15/2017  . Wheezing 07/15/2017  . Leukocytosis 07/15/2017  . Bilateral calf pain 07/15/2017  . Low back pain 07/13/2017  . Cephalalgia 01/29/2016  . Obesity (BMI 35.0-39.9 without comorbidity) 01/29/2016  . Hypertriglyceridemia 01/29/2016  . H/O respiratory system disease 01/29/2016  . H/O erectile dysfunction 01/29/2016  . Cerebrovascular accident, old 01/29/2016  . 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 Chronic obstructive pulmonary disease ([place]) 01/29/2016  . Benign fibroma of prostate 01/29/2016  . Chronic pain 01/29/2016  . 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 Abnormal MRI, lumbar spine 01/29/2016  . Chronic low back pain (Location of Primary Source of Pain) (Bilateral) (L>R) 01/29/2016  . Chronic lower extremity pain (Location of Secondary source of pain) (Bilateral) (L>R) 01/29/2016  . Long term current use of opiate analgesic 01/29/2016  . Long term prescription opiate use 01/29/2016  . Opiate use 01/29/2016  . Encounter for therapeutic drug level monitoring 01/29/2016  . Encounter for pain management planning 01/29/2016  . Chronic lumbar radicular pain (Location of Secondary source of pain) (Bilateral) (L4 Dermatome) (L>R) 01/29/2016  . Chronic hip pain (Location of Tertiary source of pain) (Bilateral) (L>R) 01/29/2016  . Chronic knee pain (Bilateral) (L>R) 01/29/2016  . Chronic anticoagulation (Plavix) 01/29/2016  . Chronic bilateral low back pain without sciatica 01/29/2016  . Erectile dysfunction of organic origin 01/22/2016  . BPH with obstruction/lower urinary tract symptoms 07/24/2015  . Hypogonadism in male 07/24/2015  . HNP (herniated nucleus pulposus), lumbar 11/05/2014    Class: Acute  . Herniated  nucleus pulposus, lumbar 11/05/2014  . Pulmonary nodules 10/25/2013  . ILD (interstitial lung disease) ([place]) 08/22/2013  . Traumatic tear of lateral meniscus of right knee   . Medial meniscus, posterior horn derangement   . COPD (chronic obstructive pulmonary disease) ([place])   . Shortness of breath   . Sleep apnea   . Stroke ([place])   . Arthritis   . GERD (gastroesophageal reflux disease)   . History of GI bleed   . HOH (hard of hearing)   . Full dentures   . Complication of anesthesia     Past Surgical History:  Procedure Laterality Date  . APPENDECTOMY  1963  . BACK SURGERY    . CARPAL TUNNEL RELEASE     left  . CATARACT EXTRACTION W/PHACO Left 09/13/2018   Procedure: CATARACT EXTRACTION PHACO AND INTRAOCULAR LENS PLACEMENT (IOC);  Surgeon: Birder Robson, MD;  Location: [facility] ORS;  Service: Ophthalmology;  Laterality: Left;  [place]  00:42 CDE 6.73 Fluid pack lot #@  [id number]  . CATARACT EXTRACTION W/PHACO Right 11/23/2019   Procedure: CATARACT EXTRACTION PHACO AND INTRAOCULAR LENS PLACEMENT ([place]) RIGHT;  Surgeon: Birder Robson, MD;  Location: [facility] ORS;  Service: Ophthalmology;  Laterality: Right;  [place] 01:10.1 CDE 10.25 Fluid Pack Lot # [id number]  . COLONOSCOPY    . COLONOSCOPY WITH PROPOFOL N/A 12/07/2016   Procedure: COLONOSCOPY WITH PROPOFOL;  Surgeon: Lollie Sails, MD;  Location: [facility] ENDOSCOPY;  Service: Endoscopy;  Laterality: N/A;  . COLONOSCOPY WITH PROPOFOL N/A 06/26/2019   Procedure: COLONOSCOPY WITH PROPOFOL;  Surgeon: Lollie Sails, MD;  Location: [facility] ENDOSCOPY;  Service: Endoscopy;  Laterality: N/A;  . EYE SURGERY     foreign body removed fr. eye, ? side   . FOOT FOREIGN BODY REMOVAL  1976   left foot -multiple pieces glass  . HEMORROIDECTOMY  1995  . KNEE ARTHROSCOPY Bilateral 01/17/2013   Procedure: ARTHROSCOPY KNEE BILATERAL WITH MEDIAL AND LATERAL MENISECTOMIES;  Surgeon: Lorn Junes, MD;  Location: [place];  Service: Orthopedics;   Laterality: Bilateral;  . KYPHOPLASTY N/A 07/23/2017   Procedure: KYPHOPLASTY- L1;  Surgeon: Hessie Knows, MD;  Location: [facility] ORS;  Service: Orthopedics;  Laterality: N/A;  . LUMBAR LAMINECTOMY N/A 11/05/2014   Procedure: LEFT L3-4 MICRODISCECTOMY;  Surgeon: Jessy Oto, MD;  Location: [place];  Service: Orthopedics;  Laterality: N/A;  . LUMBAR LAMINECTOMY/DECOMPRESSION MICRODISCECTOMY N/A 02/12/2015   Procedure: RE-DO LEFT L3-4 MICRODISCECTOMY;  Surgeon: Jessy Oto, MD;  Location: [place];  Service: Orthopedics;  Laterality: N/A;  . NASAL SEPTUM SURGERY  1995  . TONSILLECTOMY    . UPPER GI ENDOSCOPY    . UVULOPALATOPHARYNGOPLASTY (UPPP)/TONSILLECTOMY/SEPTOPLASTY  1995   same time with septoplasty-ended up ICU almost trached    Prior to Admission medications   Medication Sig Start Date End Date Taking? Authorizing Provider  dutasteride (AVODART) 0.5 MG capsule TAKE 1 CAPSULE BY MOUTH EVERY DAY 12/13/20   McGowan, Larene Beach A, PA-C  acetaminophen (TYLENOL) 500 MG tablet Take 1,000 mg by mouth every 6 (six) hours as needed for moderate pain.     [provider]  ADVAIR HFA [phone] MCG/ACT inhaler INHALE TWO PUFFS TWICE A DAY 09/23/20   [name], MD  albuterol (VENTOLIN HFA) 108 (90 Base) MCG/ACT inhaler PLACE ONE INHALATION TWICE DAILY 09/23/20   [name], MD  amoxicillin (AMOXIL) 500 MG capsule Take 1 capsule (500 mg total) by mouth 3 (three) times daily. 12/19/20   Beckie Salts, FNP  azithromycin (ZITHROMAX) 250 MG tablet 2 tab po daily  For 3 days 12/02/20   [name], MD  Cholecalciferol (VITAMIN D3 PO) Take 1 tablet by mouth daily.    [provider]  clopidogrel (PLAVIX) 75 MG tablet Take 75 mg by mouth daily.     [provider]  Coenzyme Q10 (CO Q-10) 100 MG CAPS Take 100 mg by mouth daily.     [provider]  donepezil (ARICEPT) 10 MG tablet Take 10 mg by mouth at bedtime.    [provider]  EPINEPHrine 0.3 mg/0.3 mL IJ SOAJ  injection Inject 0.3 mg into the muscle as needed for anaphylaxis.  08/09/18   [provider]  fluticasone (FLONASE) 50 MCG/ACT nasal spray TAKE 2 SPRAYS INTO EACH NOSTRIL EVERY DAY 05/30/20   [name], MD  furosemide (LASIX) 40 MG tablet TAKE ONE TABLET BY MOUTH EVERY DAY 11/25/20   [name], MD  glucosamine-chondroitin 500-400 MG tablet Take 1 tablet by mouth 3 (three) times  daily.    [provider]  hydrOXYzine (ATARAX/VISTARIL) 10 MG tablet Take 20 mg by mouth at bedtime.  06/10/13   [provider]  ketoconazole (NIZORAL) 2 % shampoo Apply 1 application topically 2 (two) times a week. Apply to scalp twice weekly leave in for 5 to 10 mins. 04/04/20   Ralene Bathe, MD  levothyroxine (SYNTHROID) 50 MCG tablet TAKE 1 TABLET EVERY DAY ON EMPTY STOMACHWITH A GLASS OF WATER AT LEAST 30-60 MINBEFORE BREAKFAST 12/13/20   Beckie Salts, FNP  lidocaine (LIDODERM) 5 % Place 1 patch onto the skin daily.  11/08/18   [provider]  Melatonin 3 MG TABS Take 1 tablet by mouth at bedtime.    [provider]  Methen-Hyosc-Meth Blue-Na Phos (UROGESIC-BLUE) 81.6 MG TABS 1 tab every 8 hours as needed for burning 07/12/20   Stoioff, Ronda Fairly, MD  metroNIDAZOLE (FLAGYL) 500 MG tablet Take 1 tablet (500 mg total) by mouth 3 (three) times daily. 12/19/20   Beckie Salts, FNP  mirabegron ER (MYRBETRIQ) 25 MG TB24 tablet Take 25 mg by mouth daily.    [provider]  phenazopyridine (PYRIDIUM) 100 MG tablet Take 1 tablet (100 mg total) by mouth 3 (three) times daily as needed for pain. 06/25/20   McGowan, Larene Beach A, PA-C  RA KRILL OIL 500 MG CAPS Take 500 mg by mouth daily.     [provider]  SYMBICORT 160-4.5 MCG/ACT inhaler USE 2 PUFFS INTO LUNGS TWICE DAILY 09/23/20   [name], MD  tamsulosin (FLOMAX) 0.4 MG CAPS capsule TAKE 1 CAPSULE BY MOUTH EVERY DAY 03/25/20   McGowan, Larene Beach A, PA-C  tiotropium (SPIRIVA) 18 MCG inhalation capsule Place 1  capsule (18 mcg total) into inhaler and inhale daily. 08/20/14   Juanito Doom, MD  TOLTERODINE TARTRATE PO Take 4 mg by mouth daily.    [provider]  topiramate (TOPAMAX) 50 MG tablet Take 50 mg by mouth 2 (two) times daily.    [provider]  TOVIAZ 8 MG TB24 tablet Take 8 mg by mouth daily. 09/21/20   [provider]  Turmeric (QC TUMERIC COMPLEX) 500 MG CAPS Take by mouth.    [provider]  vitamin B-12 (CYANOCOBALAMIN) 1000 MCG tablet Take 1,000 mcg by mouth daily.    [provider]    Allergies Asa [aspirin], Bee venom, Nsaids, and Tolmetin  Family History  Problem Relation Age of Onset  . Cancer Mother   . Heart attack Father   . Heart disease Father   . Heart disease Other   . Arthritis Other   . Prostate cancer Neg Hx   . Kidney disease Neg Hx     Social History Social History   Tobacco Use  . Smoking status: Former Smoker    Packs/day: 1.50    Years: 40.00    Pack years: 60.00    Types: Cigarettes    Quit date: 01/14/1996    Years since quitting: 24.9  . Smokeless tobacco: Never Used  Vaping Use  . Vaping Use: Never used  Substance Use Topics  . Alcohol use: No    Comment: wine in a month  . Drug use: No    Review of Systems Constitutional: No fever/chills Eyes: No visual changes. ENT: No sore throat. Cardiovascular: Denies chest pain. Respiratory: Denies shortness of breath. Gastrointestinal: See HPI Genitourinary: Negative for dysuria. Musculoskeletal: Chronic back pain Skin: Negative for rash. Neurological: Negative for headaches, areas of focal weakness or  numbness.    ____________________________________________   PHYSICAL EXAM:  VITAL SIGNS: ED Triage Vitals  Enc Vitals Group     BP 01/03/21 1107 127/81     Pulse Rate 01/03/21 1102 77     Resp 01/03/21 1102 18     Temp 01/03/21 1102 97.9 F (36.6 C)     Temp Source 01/03/21 1102 Oral     SpO2 01/03/21 1102 98 %     Weight  01/03/21 1104 260 lb (117.9 kg)     Height 01/03/21 1104 6' (1.829 m)     Head Circumference --      Peak Flow --      Pain Score 01/03/21 1104 7     Pain Loc --      Pain Edu? --      Excl. in [place]? --     Constitutional: Alert and oriented.  Appears generally fatigued, generally weak but no focal weakness.  Fully oriented Eyes: Conjunctivae are normal. Head: Atraumatic. Nose: No congestion/rhinnorhea. Mouth/Throat: Mucous membranes are slightly dry. Neck: No stridor.  Cardiovascular: Normal rate, regular rhythm. Grossly normal heart sounds.  Good peripheral circulation. Respiratory: Normal respiratory effort.  No retractions. Lungs CTAB. Gastrointestinal: Soft and nontender except across the mid to right upper abdomen reports moderate tenderness to palpation. No distention. Musculoskeletal: No lower extremity tenderness nor edema. Neurologic:  Normal speech and language. No gross focal neurologic deficits are appreciated.  Skin:  Skin is warm, dry and intact. No rash noted. Psychiatric: Mood and affect are normal. Speech and behavior are normal.  ____________________________________________   LABS (all labs ordered are listed, but only abnormal results are displayed)  Labs Reviewed  COMPREHENSIVE METABOLIC PANEL - Abnormal; Notable for the following components:      Result Value   Chloride 96 (*)    Albumin 3.4 (*)    AST 261 (*)    ALT 102 (*)    Alkaline Phosphatase 616 (*)    Total Bilirubin 3.3 (*)    All other components within normal limits  CBC WITH DIFFERENTIAL/PLATELET - Abnormal; Notable for the following components:   RDW 18.1 (*)    All other components within normal limits  LACTIC ACID, PLASMA - Abnormal; Notable for the following components:   Lactic Acid, Venous 3.4 (*)    All other components within normal limits  LACTIC ACID, PLASMA - Abnormal; Notable for the following components:   Lactic Acid, Venous 2.5 (*)    All other components within normal  limits  CULTURE, BLOOD (ROUTINE X 2)  CULTURE, BLOOD (ROUTINE X 2)  URINE CULTURE  RESP PANEL BY RT-PCR (FLU A&B, COVID) ARPGX2  LIPASE, BLOOD  URINALYSIS, ROUTINE W REFLEX MICROSCOPIC   ____________________________________________  EKG  Normal sinus rhythm, frequent PACs Right bundle blanch block Heart rate 80 No acute ischemic changes noted ____________________________________________  RADIOLOGY  CT ABDOMEN PELVIS W CONTRAST  Result Date: 01/03/2021 CLINICAL DATA:  Right lower quadrant abdominal pain. EXAM: CT ABDOMEN AND PELVIS WITH CONTRAST TECHNIQUE: Multidetector CT imaging of the abdomen and pelvis was performed using the standard protocol following bolus administration of intravenous contrast. CONTRAST:  158mL OMNIPAQUE IOHEXOL 300 MG/ML  SOLN COMPARISON:  October 29, 2014. FINDINGS: Lower chest: 5.6 x 4.4 cm lobulated mass is noted in the left lower lobe laterally concerning for malignancy. Hepatobiliary: No gallstones or biliary dilatation is noted. Multiple rounded low densities are noted throughout the liver of varying sizes consistent with diffuse hepatic metastases. The largest measures 7.1 x  4.3 cm in the left hepatic lobe. Pancreas: Unremarkable. No pancreatic ductal dilatation or surrounding inflammatory changes. Spleen: Normal in size without focal abnormality. Adrenals/Urinary Tract: Adrenal glands are unremarkable. Kidneys are normal, without renal calculi, focal lesion, or hydronephrosis. Bladder is unremarkable. Stomach/Bowel: Stomach appears normal. Status post appendectomy. There is no evidence of bowel obstruction or inflammation. Vascular/Lymphatic: Atherosclerosis of abdominal aorta is noted without aneurysm or dissection. 2.4 cm left periaortic lymph node is noted concerning for metastatic disease. Reproductive: Prostate is unremarkable. Other: No abdominal wall hernia or abnormality. No abdominopelvic ascites. Musculoskeletal: No acute or significant osseous  findings. 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 IMPRESSION: 1. 5.6 x 4.4 cm lobulated mass is noted in the left lower lobe laterally concerning for malignancy. 2. Multiple rounded low densities are noted throughout the liver of varying sizes consistent with diffuse hepatic metastases. The largest measures 7.1 x 4.3 cm in the left hepatic lobe. 3. 2.4 cm left periaortic lymph node is noted concerning for metastatic disease. 4. Aortic atherosclerosis. Aortic Atherosclerosis (ICD10-I70.0). Electronically Signed   By: Marijo Conception M.D.   On: 01/03/2021 13:51    CT scan reviewed, discussed with the patient and notified him of critical results including concerning findings of mass in his left lung as well as in his liver.  Patient denies history of malignancy except for some skin cancers in the past.   ____________________________________________   PROCEDURES  Procedure(s) performed: None  Procedures  Critical Care performed: No  ____________________________________________   INITIAL IMPRESSION / ASSESSMENT AND PLAN / ED COURSE  Pertinent labs & imaging results that were available during my care of the patient were reviewed by me and considered in my medical decision making (see chart for details).   Patient presents for progressive weakness associated with a worsening right flank pain for what what 1 2 weeks  He appears generally weak no focal findings, no neurologic symptoms.  However he does have increasing pain and tenderness of the right side.  We will proceed with CT imaging to further evaluate.  Denies any acute pulmonary issues or cardiac symptoms.  No acute vascular symptoms  ----------------------------------------- 4:13 PM on 01/03/2021 -----------------------------------------   We will admit to the hospital for further work-up of what appears  to be in new metastatic disease.  Discussed with Dr. Blaine Hamper of the hospitalist service.  Patient has significant generalized weakness to the point he is no longer ambulatory and are able to get himself out of bed at home, he lives by himself.  He will need care coordination and anticipate oncology consultation which Dr. Blaine Hamper and discussed.  Urinalysis pending at this time.  Patient understanding agreeable with plan for admission      ____________________________________________   FINAL CLINICAL IMPRESSION(S) / ED DIAGNOSES  Final diagnoses:  Weakness  Liver mass  Lung mass        Note:  This document was prepared using Dragon voice recognition software and may include unintentional dictation errors       Delman Kitten, MD 01/03/21 1614

## 2021-01-04 ENCOUNTER — Inpatient Hospital Stay: Payer: Medicare PPO

## 2021-01-04 ENCOUNTER — Encounter: Payer: Self-pay | Admitting: Internal Medicine

## 2021-01-04 DIAGNOSIS — R7989 Other specified abnormal findings of blood chemistry: Secondary | ICD-10-CM | POA: Diagnosis not present

## 2021-01-04 DIAGNOSIS — R918 Other nonspecific abnormal finding of lung field: Secondary | ICD-10-CM | POA: Diagnosis not present

## 2021-01-04 DIAGNOSIS — F0390 Unspecified dementia without behavioral disturbance: Secondary | ICD-10-CM

## 2021-01-04 LAB — COMPREHENSIVE METABOLIC PANEL
ALT: 83 U/L — ABNORMAL HIGH (ref 0–44)
AST: 229 U/L — ABNORMAL HIGH (ref 15–41)
Albumin: 2.7 g/dL — ABNORMAL LOW (ref 3.5–5.0)
Alkaline Phosphatase: 528 U/L — ABNORMAL HIGH (ref 38–126)
Anion gap: 12 (ref 5–15)
BUN: 13 mg/dL (ref 8–23)
CO2: 26 mmol/L (ref 22–32)
Calcium: 9 mg/dL (ref 8.9–10.3)
Chloride: 99 mmol/L (ref 98–111)
Creatinine, Ser: 0.89 mg/dL (ref 0.61–1.24)
GFR, Estimated: >60 mL/min (ref >60)
Glucose, Bld: 92 mg/dL (ref 70–99)
Potassium: 4.2 mmol/L (ref 3.5–5.1)
Sodium: 137 mmol/L (ref 135–145)
Total Bilirubin: 2.7 mg/dL — ABNORMAL HIGH (ref 0.3–1.2)
Total Protein: 6 g/dL — ABNORMAL LOW (ref 6.5–8.1)

## 2021-01-04 LAB — LIPID PANEL
Cholesterol: 171 mg/dL (ref 0–200)
HDL: 19 mg/dL — ABNORMAL LOW (ref >40)
LDL Cholesterol: 116 mg/dL — ABNORMAL HIGH (ref 0–99)
Total CHOL/HDL Ratio: 9 RATIO
Triglycerides: 178 mg/dL — ABNORMAL HIGH (ref <150)
VLDL: 36 mg/dL (ref 0–40)

## 2021-01-04 LAB — CBC
HCT: 39.6 % (ref 39.0–52.0)
Hemoglobin: 12.8 g/dL — ABNORMAL LOW (ref 13.0–17.0)
MCH: 29.8 pg (ref 26.0–34.0)
MCHC: 32.3 g/dL (ref 30.0–36.0)
MCV: 92.1 fL (ref 80.0–100.0)
Platelets: 197 10*3/uL (ref 150–400)
RBC: 4.3 MIL/uL (ref 4.22–5.81)
RDW: 17.9 % — ABNORMAL HIGH (ref 11.5–15.5)
WBC: 8.7 10*3/uL (ref 4.0–10.5)
nRBC: 0 % (ref 0.0–0.2)

## 2021-01-04 LAB — GLUCOSE, CAPILLARY
Glucose-Capillary: 88 mg/dL (ref 70–99)
Glucose-Capillary: 135 mg/dL — ABNORMAL HIGH (ref 70–99)
Glucose-Capillary: 126 mg/dL — ABNORMAL HIGH (ref 70–99)
Glucose-Capillary: 100 mg/dL — ABNORMAL HIGH (ref 70–99)

## 2021-01-04 LAB — HEPARIN LEVEL (UNFRACTIONATED)
Heparin Unfractionated: 0.31 IU/mL (ref 0.30–0.70)
Heparin Unfractionated: 0.26 IU/mL — ABNORMAL LOW (ref 0.30–0.70)

## 2021-01-04 LAB — TROPONIN I (HIGH SENSITIVITY): Troponin I (High Sensitivity): 13 ng/L (ref <18)

## 2021-01-04 LAB — HEMOGLOBIN A1C
Hgb A1c MFr Bld: 5 % (ref 4.8–5.6)
Mean Plasma Glucose: 96.8 mg/dL

## 2021-01-04 MED ORDER — LORAZEPAM 1 MG PO TABS
1.0000 mg | ORAL_TABLET | Freq: Once | ORAL | Status: AC
  Administered 2021-01-04: 1 mg via ORAL
  Filled 2021-01-04: qty 1

## 2021-01-04 MED ORDER — IOHEXOL 350 MG/ML SOLN
75.0000 mL | Freq: Once | INTRAVENOUS | Status: AC | PRN
  Administered 2021-01-04: 15:00:00 75 mL via INTRAVENOUS

## 2021-01-04 MED ORDER — HEPARIN BOLUS VIA INFUSION
1500.0000 [IU] | Freq: Once | INTRAVENOUS | Status: AC
  Administered 2021-01-04: 1500 [IU] via INTRAVENOUS
  Filled 2021-01-04: qty 1500

## 2021-01-04 MED ORDER — HEPARIN (PORCINE) 25000 UT/250ML-% IV SOLN
1950.0000 [IU]/h | INTRAVENOUS | Status: DC
  Administered 2021-01-05 (×2): 1950 [IU]/h via INTRAVENOUS
  Filled 2021-01-04: qty 250

## 2021-01-04 MED ORDER — OXYCODONE HCL ER 10 MG PO T12A
20.0000 mg | EXTENDED_RELEASE_TABLET | Freq: Two times a day (BID) | ORAL | Status: DC
Start: 2021-01-04 — End: 2021-01-05
  Administered 2021-01-04: 22:00:00 20 mg via ORAL
  Filled 2021-01-04: qty 2

## 2021-01-04 MED ORDER — GADOBUTROL 1 MMOL/ML IV SOLN
10.0000 mL | Freq: Once | INTRAVENOUS | Status: AC | PRN
  Administered 2021-01-04: 10 mL via INTRAVENOUS

## 2021-01-04 NOTE — Progress Notes (Signed)
OT Cancellation Note  Patient Details Name: Eddie Carter MRN: [id number] DOB: 11-18-1941   Cancelled Treatment:    Reason Eval/Treat Not Completed: Other (comment)  OT consult received and chart reviewed. Pt pending CT to r/o PE at this time. Will f/u for OT evaluation at later date/time as appropriate. Thank you.  Gerrianne Scale, [place], OTR/L ascom [phone] 01/04/21, 10:56 AM

## 2021-01-04 NOTE — Progress Notes (Signed)
  Chaplain On-Call responded to Order Requisition reading "someone to talk to; unexpected test results".  Chaplain sat at patient's bedside and provided much listening support as patient described the difficult diagnosis that he has received. Patient stated that there are cancerous masses in his liver and lungs. He is awaiting a biopsy procedure this afternoon which will provide more information.  Chaplain provided spiritual and emotional support for the patient, and assured him of further availability of Chaplains as needed.  [place]ax Marquavion Venhuizen M.Div., [facility]

## 2021-01-04 NOTE — Progress Notes (Signed)
PT Cancellation Note  Patient Details Name: SELIG WAMPOLE MRN: [id number] DOB: 10-Sep-1941   Cancelled Treatment:    Reason Eval/Treat Not Completed: Medical issues which prohibited therapy. PT orders received and pt chart reviewed. Per RN report and chart review, pt pending chest CT to rule out PE. Will follow up with therapy intervention pending imaging, as appropriate.  Herminio Commons, PT, DPT 10:49 AM,01/04/21

## 2021-01-04 NOTE — Consult Note (Signed)
ANTICOAGULATION CONSULT NOTE - Consult  Pharmacy Consult for Heparin gtt Indication: pulmonary embolus  Allergies  Allergen Reactions  . Asa [Aspirin] Shortness Of Breath  . Bee Venom Anaphylaxis  . Nsaids Other (See Comments)    Makes him bleed.  . Tolmetin Other (See Comments)    Makes him bleed.    Patient Measurements: Height: 6' (182.9 cm) Weight: 117.9 kg (260 lb) IBW/kg (Calculated) : 77.6 Heparin Dosing Weight: 103 kg   Vital Signs: Temp: 98.5 F (36.9 C) (02/12 1142) Temp Source: Oral (02/12 0723) BP: 116/61 (02/12 1142) Pulse Rate: 77 (02/12 1142)  Labs: Recent Labs    01/03/21 1111 01/03/21 1849 01/03/21 2045 01/04/21 0604  HGB 15.2  --   --  12.8*  HCT 47.0  --   --  39.6  PLT 230  --   --  197  APTT  --  25  --   --   LABPROT  --  14.3  --   --   INR  --  1.2  --   --   HEPARINUNFRC  --   --   --  0.31  CREATININE 0.90  --   --  0.89  TROPONINIHS  --  11 12 13     Estimated Creatinine Clearance: 90.7 mL/min (by C-G formula based on SCr of 0.89 mg/dL).  Heparin Dosing Weight: 103 kg   Medications:  Allergic to ASA and NSAIDs PTA: Plavix 75mg  QD Inpatient: Heparin 5k units SQ Q8h --> heparin gtt  Assessment: 80yo Male with h/o diet-controlled DM, COPD/Asthma, stroke, GERD, hypothyroidism, MDD/Anxiety, OSA (on CPAP), IBS, urethral stricture, diverticulitis, GI bleeding, BPH, interstitial lung dz, dementia presenting with abd'l pain x3wks, CP, and generalized weakness. C/f malignant lung mass; holding plavix for biopsy. Pharmacy consulted for mgmt of hep gtt started empirically for concern of PE.  Baseline: aPTT 25s; PT/INR 14.3/1.2; H/H 15.2/47; Plts 230; trop 12  Date Time HL Rate/Comment 02/12 0604 0.31 Barely therapeutic. Will    slightly increase drip rate to    1750 unit/hr   Goal of Therapy:  Heparin level 0.3-0.7 units/ml Monitor platelets by anticoagulation protocol: Yes   Plan:  Give 6100 units bolus x 1 Start heparin infusion at  1700 units/hr Check anti-Xa level in 8 hours and daily while on heparin Continue to monitor H&H and platelets   2/12 0604 HL= 0.31. barely therapeutic. Will slightly increase drip rate to 1750 units/hr. Recheck in 8 hrs.  Eufemia Prindle A 01/04/2021,1:34 PM

## 2021-01-04 NOTE — Evaluation (Signed)
Occupational Therapy Evaluation Patient Details Name: Eddie Carter MRN: [id number] DOB: 18-Jan-1941 Today's Date: 01/04/2021    History of Present Illness Pt is a 80 y/o M with PMH: arthritis, asthma, BPH, GERD, DM, GIB, HOH, obesity, SOB, OSA, stroke, and interstial lung disease. Pt presented to ED with c/o abdominal pain, chest pain, generalized weakness. 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   Clinical Impression   Pt seen for OT evaluation this date in setting of acute hospitalization with abd pain and subsequently was found to have cancer metastasis. Pt reports living at home alone in [facility] with ramped entrance and primarily using a power chair for fxl mobility. States he used to have aide help for bathing a few days a week, but "she just stopped coming". States he used to have some assist from his son for IADLs, but his son works and pt has largely had to be self reliant including driving himself and using motorized scooter at [employer]. Pt presents this date somewhat sad given the news of his prognosis and physically, he is grossly weak and deconditioned. However, pt still with some good strength, especially in UEs and good rehab potential. OT facilitates therapeutic listening and therapeutic use of self while pt considers his mortality. OT also allows pt to consider where therapy falls in his priorities given today's news. While pt does not feel up to getting OOB during this session, OT educates and motivates regarding his current strength and potential. Pt has moderate strength in his trunk and extremities should he choose to have therapy help him maintain current skill level or decreased risk of declining. Pt is agreeable to trying occupational therapy and continuing to consider his options at this time. OT covers benefits of therapy participation in setting of poor cancer prognosis and pt agrees that at least maintaining strength  is a current priority of his. Pt currently requiring SETUP to MIN A with bed level UB ADLs, MOD/MAX A with LB ADLs. Will continue to follow acutely as pt wishes and continue to assess for his goals and appropriateness of therapy. Pt wishes to go home, but will not be able to without significantly increased assistance including [facility] [place] therapy and aides.    Follow Up Recommendations  Home health OT;Supervision/Assistance - 24 hour    Equipment Recommendations  None recommended by OT (pt reports having all necessary equipment)    Recommendations for Other Services       Precautions / Restrictions Precautions Precautions: Fall Restrictions Weight Bearing Restrictions: No      Mobility Bed Mobility Overal bed mobility: Needs Assistance Bed Mobility: Rolling Rolling: Mod assist              Transfers                 General transfer comment: declines to perform sup to sit transition or standing with OT this date. He has just found out his cancer prognosis is worse via CT scan today and is presents somewhat dejected.    Balance                                           ADL either performed or assessed with clinical judgement   ADL Overall ADL's : Needs assistance/impaired  General ADL Comments: requires SETUP to MIN A with bed level UB ADLs, MOD/MAX A with LB ADLs. declines OOB activity at time of OT evaluation     Vision Baseline Vision/History: Wears glasses Wears Glasses: At all times Patient Visual Report: No change from baseline       Perception     Praxis      Pertinent Vitals/Pain Pain Assessment: 0-10 Pain Score: 7  Pain Location: lower back/bottom/stomach area Pain Descriptors / Indicators: Sore Pain Intervention(s): Limited activity within patient's tolerance;Monitored during session     Hand Dominance Right   Extremity/Trunk Assessment Upper Extremity Assessment Upper  Extremity Assessment: RUE deficits/detail;LUE deficits/detail RUE Deficits / Details: shld ROM limited to 1/2 range with pt reporting h/o torn rotator cuff. grip MMT grossly 4-/5 LUE Deficits / Details: Shld ROM WFL, shld, elbow, and grip MMT grossly 4-/5   Lower Extremity Assessment Lower Extremity Assessment: Defer to PT evaluation       Communication Communication Communication: No difficulties   Cognition Arousal/Alertness: Awake/alert Behavior During Therapy: WFL for tasks assessed/performed Overall Cognitive Status: Within Functional Limits for tasks assessed                                 General Comments: appropriate throughout and kind, somewhat dejected/sad and processing cancer dx. But appropriate overall.   General Comments       Exercises Other Exercises Other Exercises: OT facilitates therapeutic listening and therapeutic use of self while pt considers his mortality. OT also allows pt to consider where therapy falls in his priorities given today's news. While pt does not feel up to getting OOB during this session, OT educates and motivates regarding his current strength and potential. Pt has moderate strength in his trunk and extremities should he choose to have therapy help him maintain current skill level or decreased risk of declining. Pt is agreeable to trying occupational therapy and continuing to consider his options at this time. OT covers benefits of therapy participation in setting of poor cancer prognosis and pt agrees that at least maintaining strength is a current priority of his.   Shoulder Instructions      Home Living Family/patient expects to be discharged to:: Private residence Living Arrangements: Alone Available Help at Discharge: Family Type of Home: House Home Access: [place]: One level     Bathroom Shower/Tub: [place]: Handicapped height     Home Equipment: Environmental consultant - 2  wheels;Walker - 4 wheels;Bedside commode;Shower seat;Wheelchair - power          Prior Functioning/Environment Level of Independence: Needs assistance  Gait / Transfers Assistance Needed: uses power wheelchair primarily for fxl mobility. States it's been about a year since he was up walking wtih a walker. ADL's / Homemaking Assistance Needed: reports he performs basic self care I'ly, struggles  to perform IADLs, but states he does his own cooking and cleaning. Pt runs his own errands but gets motor scooter at grocery store.   Comments: did have aide help about a year ago, but states "she just stopped coming"        OT Problem List: Decreased strength;Decreased activity tolerance;Cardiopulmonary status limiting activity;Obesity;Pain      OT Treatment/Interventions: Self-care/ADL training;DME and/or AE instruction;Therapeutic activities;Balance training;Therapeutic exercise;Energy conservation;Patient/family education    OT Goals(Current goals can be found in the care plan section) Acute Rehab OT Goals Patient Stated  Goal: to continue to process and figure out what I want, but for right now-to stay as good as possible OT Goal Formulation: With patient Time For Goal Achievement: 01/18/21 Potential to Achieve Goals: Good ADL Goals Pt Will Transfer to Toilet: with min guard assist;with min assist (with LRAD to [facility] versus restroom given pt's tolerance) Pt/caregiver will Perform Home Exercise Program: Increased strength;Both right and left upper extremity;With Supervision (mindful of R shoulder limitations) Additional ADL Goal #1: Pt will tolerate static sitting x5-6 minutes to engage in ADLs versus therapeutic activity to increase functional activity tolerance.  OT Frequency: Min 1X/week   Barriers to D/C:            Co-evaluation              AM-PAC OT "6 Clicks" Daily Activity     Outcome Measure Help from another person eating meals?: None Help from another person taking  care of personal grooming?: None Help from another person toileting, which includes using toliet, bedpan, or urinal?: A Lot Help from another person bathing (including washing, rinsing, drying)?: A Little Help from another person to put on and taking off regular upper body clothing?: None Help from another person to put on and taking off regular lower body clothing?: A Lot 6 Click Score: 19   End of Session Nurse Communication: Mobility status  Activity Tolerance: Patient tolerated treatment well Patient left: in bed;with call bell/phone within reach  OT Visit Diagnosis: Muscle weakness (generalized) (M62.81);Pain Pain - part of body:  (back/bottom/stomach)                Time: 5329-9242 OT Time Calculation (min): 46 min Charges:  OT General Charges $OT Visit: 1 Visit OT Evaluation $OT Eval Moderate Complexity: 1 Mod OT Treatments $Self Care/Home Management : 23-37 mins $Therapeutic Activity: 8-22 mins  Gerrianne Scale, MS, OTR/L ascom [phone] 01/04/21, 7:20 PM

## 2021-01-04 NOTE — Progress Notes (Signed)
PROGRESS NOTE    Eddie Carter  WRU:[id number] DOB: 28-Oct-1941 DOA: 01/03/2021 PCP: Eddie [place], MD   Chief complaint.  Chest pain and abdominal pain. Brief Narrative:  Eddie Carter is a 80 y.o. male with medical history significant of diet-controlled diabetes, COPD, asthma, stroke, GERD, hypothyroidism, depression, anxiety, OSA on CPAP, IBS, urethral stricture, diverticulitis, GI bleeding, BPH, interstitial lung disease, dementia, who presents with abdominal pain, chest pain, generalized weakness. 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  Lung mass with liver metastasis. Severe abdominal pain and chest pain secondary to cancer. Patient has been seen by oncology, scheduled biopsy on Monday. Currently has a severe pain in the chest and the right upper quadrant.  This is secondary to cancer.  Will add scheduled OxyContin and as needed oxycodone. Patient will be in the hospital for pain control. Oncology also ordered brain MRI and CT angiogram of the chest.  Will follow on the results Plan to discharge home on Monday after biopsies taken.  2.  Interstitial lung disease and COPD. Stable, no bronchospasm.  No hypoxemia  3.  Type 2 diabetes. Well-controlled.  4.  Chronic dementia.  Follow.  5.  Lactic acidosis. Secondary to cancer.    DVT prophylaxis: Heparin Code Status: DNR Family Communication:  Disposition Plan:  .   Status is: Inpatient  Remains inpatient appropriate  because:Inpatient level of care appropriate due to severity of illness   Dispo: The patient is from: Home              Anticipated d/c is to: Home              Anticipated d/c date is: 2 days              Patient currently is not medically stable to d/c.   Difficult to place patient No        I/O last 3 completed shifts: In: 1355.5 [I.V.:855.5; IV Piggyback:500] Out: 50 [Urine:50] No intake/output data recorded.     Consultants:   Oncology  Procedures: None  Antimicrobials:None  Subjective: Patient currently is still complaining of severe pain in the right chest and the right upper quadrant.  Worse with any movement. He does not have a significant short of breath or cough. No nausea vomiting. No fever or chills.  Objective: Vitals:   01/03/21 1715 01/03/21 1825 01/03/21 2029 01/04/21 0723  BP: (!) 130/93 123/67 (!) 114/55 115/66  Pulse: 81 87 84 78  Resp: 16 18 16 18   Temp:  98.5 F (36.9 C) 98.6 F (37 C) 98.4 F (36.9 C)  TempSrc:   Oral Oral  SpO2: 93% 93% 93% 91%  Weight:      Height:        Intake/Output Summary (Last 24 hours) at 01/04/2021 0949 Last data filed at 01/04/2021 9147 Gross per 24 hour  Intake 1355.51 ml  Output 50 ml  Net 1305.51 ml   Filed Weights   01/03/21 1104  Weight: 117.9  kg    Examination:  General exam: Appears calm and comfortable  Respiratory system: Clear to auscultation. Respiratory effort normal. Cardiovascular system: S1 & S2 heard, RRR. No JVD, murmurs, rubs, gallops or clicks. No pedal edema. Gastrointestinal system: Abdomen is nondistended, soft and nontender.  Significant hepatomegaly with tenderness.  Normal bowel sounds heard. Central nervous system: Alert and oriented. No focal neurological deficits. Extremities: Symmetric  Skin: No rashes, lesions or ulcers Psychiatry: Judgement and insight appear normal. Mood & affect appropriate.     Data Reviewed: I have personally reviewed following labs and  imaging studies  CBC: Recent Labs  Lab 01/03/21 1111 01/04/21 0604  WBC 9.1 8.7  NEUTROABS 6.7  --   HGB 15.2 12.8*  HCT 47.0 39.6  MCV 91.1 92.1  PLT 230 778   Basic Metabolic Panel: Recent Labs  Lab 01/03/21 1111 01/04/21 0604  NA 135 137  K 3.8 4.2  CL 96* 99  CO2 26 26  GLUCOSE 93 92  BUN 10 13  CREATININE 0.90 0.89  CALCIUM 9.3 9.0   GFR: Estimated Creatinine Clearance: 90.7 mL/min (by C-G formula based on SCr of 0.89 mg/dL). Liver Function Tests: Recent Labs  Lab 01/03/21 1111 01/04/21 0604  AST 261* 229*  ALT 102* 83*  ALKPHOS 616* 528*  BILITOT 3.3* 2.7*  PROT 7.6 6.0*  ALBUMIN 3.4* 2.7*   Recent Labs  Lab 01/03/21 1111  LIPASE 45   No results for input(s): AMMONIA in the last 168 hours. Coagulation Profile: Recent Labs  Lab 01/03/21 1849  INR 1.2   Cardiac Enzymes: No results for input(s): CKTOTAL, CKMB, CKMBINDEX, TROPONINI in the last 168 hours. BNP (last 3 results) No results for input(s): PROBNP in the last 8760 hours. HbA1C: No results for input(s): HGBA1C in the last 72 hours. CBG: Recent Labs  Lab 01/04/21 0816  GLUCAP 88   Lipid Profile: Recent Labs    01/04/21 0604  CHOL 171  HDL 19*  LDLCALC 116*  TRIG 178*  CHOLHDL 9.0   Thyroid Function Tests: No results for input(s): TSH, T4TOTAL, FREET4, T3FREE, THYROIDAB in the last 72 hours. Anemia Panel: No results for input(s): VITAMINB12, FOLATE, FERRITIN, TIBC, IRON, RETICCTPCT in the last 72 hours. Sepsis Labs: Recent Labs  Lab 01/03/21 1111 01/03/21 1410  LATICACIDVEN 3.4* 2.5*    Recent Results (from the past 240 hour(s))  Culture, blood (Routine X 2) w Reflex to ID Panel     Status: None (Preliminary result)   Collection Time: 01/03/21 11:11 AM   Specimen: BLOOD  Result Value Ref Range Status   Specimen Description BLOOD LEFT FOREARM  Final   Special Requests   Final    BOTTLES DRAWN AEROBIC AND ANAEROBIC Blood Culture adequate volume   Culture   Final     NO GROWTH < 24 HOURS Performed at [facility], [address], [place], [place] [address]    Report Status PENDING  Incomplete  Culture, blood (Routine X 2) w Reflex to ID Panel     Status: None (Preliminary result)   Collection Time: 01/03/21 11:11 AM   Specimen: BLOOD  Result Value Ref Range Status   Specimen Description BLOOD LEFT AC  Final   Special Requests   Final    BOTTLES DRAWN AEROBIC AND ANAEROBIC Blood Culture adequate volume   Culture   Final    NO GROWTH < 24 HOURS Performed at [facility], [address] [place], [place] [address]    Report Status  PENDING  Incomplete  Resp Panel by RT-PCR (Flu A&B, Covid) Nasopharyngeal Swab     Status: None   Collection Time: 01/03/21  3:55 PM   Specimen: Nasopharyngeal Swab; Nasopharyngeal(NP) swabs in vial transport medium  Result Value Ref Range Status   SARS Coronavirus 2 by RT PCR NEGATIVE NEGATIVE Final    Comment: (NOTE) SARS-CoV-2 target nucleic acids are NOT DETECTED.  The SARS-CoV-2 RNA is generally detectable in upper respiratory specimens during the acute phase of infection. The lowest concentration of SARS-CoV-2 viral copies this assay can detect is 138 copies/mL. A negative result does not preclude SARS-Cov-2 infection and should not be used as the sole basis for treatment or other patient management decisions. A negative result may occur with  improper specimen collection/handling, submission of specimen other than nasopharyngeal swab, presence of viral mutation(s) within the areas targeted by this assay, and inadequate number of viral copies(<138 copies/mL). A negative result must be combined with clinical observations, patient history, and epidemiological information. The expected result is Negative.  Fact Sheet for Patients:  EntrepreneurPulse.com.au  Fact Sheet for Healthcare Providers:  IncredibleEmployment.be  This test is no t yet approved or  cleared by the [place] FDA and  has been authorized for detection and/or diagnosis of SARS-CoV-2 by FDA under an Emergency Use Authorization (EUA). This EUA will remain  in effect (meaning this test can be used) for the duration of the COVID-19 declaration under Section 564(b)(1) of the Act, 21 U.S.C.section 360bbb-3(b)(1), unless the authorization is terminated  or revoked sooner.       Influenza A by PCR NEGATIVE NEGATIVE Final   Influenza B by PCR NEGATIVE NEGATIVE Final    Comment: (NOTE) The Xpert Xpress SARS-CoV-2/FLU/RSV plus assay is intended as an aid in the diagnosis of influenza from Nasopharyngeal swab specimens and should not be used as a sole basis for treatment. Nasal washings and aspirates are unacceptable for Xpert Xpress SARS-CoV-2/FLU/RSV testing.  Fact Sheet for Patients: EntrepreneurPulse.com.au  Fact Sheet for Healthcare Providers: IncredibleEmployment.be  This test is not yet approved or cleared by the [place] FDA and has been authorized for detection and/or diagnosis of SARS-CoV-2 by FDA under an Emergency Use Authorization (EUA). This EUA will remain in effect (meaning this test can be used) for the duration of the COVID-19 declaration under Section 564(b)(1) of the Act, 21 U.S.C. section 360bbb-3(b)(1), unless the authorization is terminated or revoked.  Performed at [facility], [address], [place], [place] [address]          Radiology Studies: CT ABDOMEN PELVIS W CONTRAST  Result Date: 01/03/2021 CLINICAL DATA:  Right lower quadrant abdominal pain. EXAM: CT ABDOMEN AND PELVIS WITH CONTRAST TECHNIQUE: Multidetector CT imaging of the abdomen and pelvis was performed using the standard protocol following bolus administration of intravenous contrast. CONTRAST:  171mL OMNIPAQUE IOHEXOL 300 MG/ML  SOLN COMPARISON:  October 29, 2014. FINDINGS: Lower chest: 5.6 x 4.4 cm lobulated mass is  noted in the left lower lobe laterally concerning for malignancy. Hepatobiliary: No gallstones or biliary dilatation is noted. Multiple rounded low densities are noted throughout the liver of varying sizes consistent with diffuse hepatic metastases. The largest measures 7.1 x 4.3 cm in the left hepatic lobe. Pancreas: Unremarkable. No pancreatic ductal dilatation or surrounding inflammatory changes. Spleen: Normal in size without focal abnormality. Adrenals/Urinary Tract: Adrenal glands are unremarkable. Kidneys are normal, without renal calculi, focal lesion, or hydronephrosis. Bladder is unremarkable. Stomach/Bowel: Stomach appears normal. Status post appendectomy. There is  no evidence of bowel obstruction or inflammation. Vascular/Lymphatic: Atherosclerosis of abdominal aorta is noted without aneurysm or dissection. 2.4 cm left periaortic lymph node is noted concerning for metastatic disease. Reproductive: Prostate is unremarkable. Other: No abdominal wall hernia or abnormality. No abdominopelvic ascites. Musculoskeletal: No acute or significant osseous findings. 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 IMPRESSION: 1. 5.6 x 4.4 cm lobulated mass is noted in the left lower lobe laterally concerning for malignancy. 2. Multiple rounded low densities are noted throughout the liver of varying sizes consistent with diffuse hepatic metastases. The largest measures 7.1 x 4.3 cm in the left hepatic lobe. 3. 2.4 cm left periaortic lymph node is noted concerning for metastatic disease. 4. Aortic atherosclerosis. Aortic Atherosclerosis (ICD10-I70.0). Electronically Signed   By: Marijo Conception M.D.   On: 01/03/2021 13:51        Scheduled Meds: . cholecalciferol  1,000 Units Oral Daily  . donepezil  10 mg Oral QHS  . dutasteride  0.5 mg Oral Daily  . fesoterodine  8 mg Oral Daily  .  fluticasone  1 spray Each Nare Daily  . hydrOXYzine  20 mg Oral QHS  . levothyroxine  50 mcg Oral Q0600  . LORazepam  1 mg Oral Once  . melatonin  3 mg Oral QHS  . mirabegron ER  25 mg Oral Daily  . mometasone-formoterol  2 puff Inhalation BID  . oxyCODONE  20 mg Oral Q12H  . tamsulosin  0.4 mg Oral Daily  . tiotropium  18 mcg Inhalation Daily  . topiramate  50 mg Oral BID  . vitamin B-12  1,000 mcg Oral Daily   Continuous Infusions: . heparin 1,700 Units/hr (01/04/21 5790)     LOS: 1 day    Time spent: 27 minutes    Sharen Hones, MD [facility]   To contact the attending provider between 7A-7P or the covering provider during after hours 7P-7A, please log into the web site [email or web address] and access using universal [place] password for that web site. If you do not have the password, please call the hospital operator.  01/04/2021, 9:49 AM

## 2021-01-04 NOTE — Progress Notes (Signed)
Pt refused cpap

## 2021-01-04 NOTE — Progress Notes (Signed)
Initial Nutrition Assessment  DOCUMENTATION CODES:      INTERVENTION:  With diet advancement -Ensure Enlive po TID, each supplement provides 350 kcal and 20 grams of protein -MVI with minerals daily  NUTRITION DIAGNOSIS:   Inadequate oral intake related to poor appetite,nausea as evidenced by per patient/family report.    GOAL:   Patient will meet greater than or equal to 90% of their needs    MONITOR:   Labs,I & O's,Diet advancement,Supplement acceptance,PO intake,Weight trends,Skin  REASON FOR ASSESSMENT:   Malnutrition Screening Tool    ASSESSMENT:  80 year old male admitted with lung mass. Past medical history significant of diet-controlled DM, COPD, asthma, GERD, hypothyroidism, depression, anxiety, OSA on CPAP, IBS, urethral stricture, diverticulitis, GIB, BPH, ILD, dementia, presents with 3 week onset of abdominal pain associated with nausea without vomiting, poor oral intake, and right sided pleuritic chest pain.  RD working remotely.  Pt is s/p CT abdomen/pelvis with contrast that revealed large left lower lobe mass with multiple liver metastasis. He has been seen by oncology, plans for biopsy on 2/14.  Attempted to reach pt via phone this afternoon, however no answer. Per notes, he complains of severe pain in right chest and right upper quadrant. Plans for CT to r/o PE this afternoon. Pt is currently NPO, will monitor for diet advancement and provide Ensure supplement TID given suspected poor oral intake secondary to pain.   Per chart, weights stable over the last 11 months.  Medications reviewed and include: D3, Aricept, Avodart, Ativan, Melatonin, Oxycodone, B12, Topomax, IV Heparin 17 ml/hr  Labs: CBGs 135,88, Alkaline Phosphatase 528 (H), AST 229 (H), ALT 83 (H) A1c 5.0 (WNL) on 01/04/21  NUTRITION - FOCUSED PHYSICAL EXAM:  Unable to complete at this time  Diet Order:   Diet Order            Diet NPO time specified  Diet effective now                  EDUCATION NEEDS:   Not appropriate for education at this time  Skin:  Skin Assessment: Reviewed RN Assessment  Last BM:  2/10  Height:   Ht Readings from Last 1 Encounters:  01/03/21 6' (1.829 m)    Weight:   Wt Readings from Last 1 Encounters:  01/03/21 117.9 kg    BMI:  Body mass index is 35.26 kg/m.  Estimated Nutritional Needs:   Kcal:  3299-2426  Protein:  130-145  Fluid:  >/= 2.5 L   Lajuan Lines, RD, LDN Clinical Nutrition After Hours/Weekend Pager # in [place]

## 2021-01-04 NOTE — Consult Note (Signed)
ANTICOAGULATION CONSULT NOTE - Consult  Pharmacy Consult for Heparin gtt Indication: pulmonary embolus  Allergies  Allergen Reactions  . Asa [Aspirin] Shortness Of Breath  . Bee Venom Anaphylaxis  . Nsaids Other (See Comments)    Makes him bleed.  . Tolmetin Other (See Comments)    Makes him bleed.    Patient Measurements: Height: 6' (182.9 cm) Weight: 117.9 kg (260 lb) IBW/kg (Calculated) : 77.6 Heparin Dosing Weight: 103 kg   Vital Signs: Temp: 98.8 F (37.1 C) (02/12 2047) Temp Source: Oral (02/12 1543) BP: 96/53 (02/12 2047) Pulse Rate: 82 (02/12 2047)  Labs: Recent Labs    01/03/21 1111 01/03/21 1849 01/03/21 2045 01/04/21 0604 01/04/21 2153  HGB 15.2  --   --  12.8*  --   HCT 47.0  --   --  39.6  --   PLT 230  --   --  197  --   APTT  --  25  --   --   --   LABPROT  --  14.3  --   --   --   INR  --  1.2  --   --   --   HEPARINUNFRC  --   --   --  0.31 0.26*  CREATININE 0.90  --   --  0.89  --   TROPONINIHS  --  11 12 13   --     Estimated Creatinine Clearance: 90.7 mL/min (by C-G formula based on SCr of 0.89 mg/dL).  Heparin Dosing Weight: 103 kg   Medications:  Allergic to ASA and NSAIDs PTA: Plavix 75mg  QD Inpatient: Heparin 5k units SQ Q8h --> heparin gtt  Assessment: 80yo Male with h/o diet-controlled DM, COPD/Asthma, stroke, GERD, hypothyroidism, MDD/Anxiety, OSA (on CPAP), IBS, urethral stricture, diverticulitis, GI bleeding, BPH, interstitial lung dz, dementia presenting with abd'l pain x3wks, CP, and generalized weakness. C/f malignant lung mass; holding plavix for biopsy. Pharmacy consulted for mgmt of hep gtt started empirically for concern of PE.  Baseline: aPTT 25s; PT/INR 14.3/1.2; H/H 15.2/47; Plts 230; trop 12  Date Time HL Rate/Comment 02/12 0604 0.31 Barely therapeutic. Will    slightly increase drip rate to    1750 unit/hr  02/12 2153 0.26 subtherapeutic   Goal of Therapy:  Heparin level 0.3-0.7 units/ml Monitor platelets by  anticoagulation protocol: Yes   Plan:  Give heparin 1500 unit bolus x 1 Will increase drip rate to 1950 units/hr.  Recheck HL in 8 hrs. Monitor H&H and Plts.  Renda Rolls, PharmD, [facility] 01/04/2021 11:46 PM

## 2021-01-05 DIAGNOSIS — R918 Other nonspecific abnormal finding of lung field: Secondary | ICD-10-CM | POA: Diagnosis not present

## 2021-01-05 DIAGNOSIS — C787 Secondary malignant neoplasm of liver and intrahepatic bile duct: Secondary | ICD-10-CM | POA: Diagnosis not present

## 2021-01-05 DIAGNOSIS — J449 Chronic obstructive pulmonary disease, unspecified: Secondary | ICD-10-CM | POA: Diagnosis not present

## 2021-01-05 DIAGNOSIS — R7989 Other specified abnormal findings of blood chemistry: Secondary | ICD-10-CM | POA: Diagnosis not present

## 2021-01-05 LAB — CBC
HCT: 44.4 % (ref 39.0–52.0)
Hemoglobin: 14.2 g/dL (ref 13.0–17.0)
MCH: 29.8 pg (ref 26.0–34.0)
MCHC: 32 g/dL (ref 30.0–36.0)
MCV: 93.1 fL (ref 80.0–100.0)
Platelets: 185 10*3/uL (ref 150–400)
RBC: 4.77 MIL/uL (ref 4.22–5.81)
RDW: 18.5 % — ABNORMAL HIGH (ref 11.5–15.5)
WBC: 9.4 10*3/uL (ref 4.0–10.5)
nRBC: 0 % (ref 0.0–0.2)

## 2021-01-05 LAB — URINE CULTURE: Culture: NO GROWTH

## 2021-01-05 LAB — HEPARIN LEVEL (UNFRACTIONATED): Heparin Unfractionated: 0.32 IU/mL (ref 0.30–0.70)

## 2021-01-05 MED ORDER — OXYCODONE HCL ER 10 MG PO T12A
10.0000 mg | EXTENDED_RELEASE_TABLET | Freq: Two times a day (BID) | ORAL | Status: DC
  Administered 2021-01-05 – 2021-01-06 (×4): 10 mg via ORAL
  Filled 2021-01-05 (×4): qty 1

## 2021-01-05 MED ORDER — ENOXAPARIN SODIUM 40 MG/0.4ML ~~LOC~~ SOLN: 40.0000 mg | SUBCUTANEOUS | Status: DC

## 2021-01-05 MED ORDER — ENOXAPARIN SODIUM 60 MG/0.6ML ~~LOC~~ SOLN
0.5000 mg/kg | SUBCUTANEOUS | Status: DC
  Administered 2021-01-05: 60 mg via SUBCUTANEOUS
  Filled 2021-01-05: qty 0.6

## 2021-01-05 MED ORDER — SENNOSIDES-DOCUSATE SODIUM 8.6-50 MG PO TABS
2.0000 | ORAL_TABLET | Freq: Two times a day (BID) | ORAL | Status: DC
  Administered 2021-01-05 – 2021-01-18 (×19): 2 via ORAL
  Filled 2021-01-05 (×20): qty 2

## 2021-01-05 NOTE — Progress Notes (Signed)
PHARMACIST - PHYSICIAN COMMUNICATION  CONCERNING:  Enoxaparin (Lovenox) for DVT Prophylaxis    RECOMMENDATION: Patient was prescribed enoxaprin 40mg  q24 hours for VTE prophylaxis.   Filed Weights   01/03/21 1104  Weight: 117.9 kg (260 lb)    Body mass index is 35.26 kg/m.  Estimated Creatinine Clearance: 90.7 mL/min (by C-G formula based on SCr of 0.89 mg/dL).   Based on [place] patient is candidate for enoxaparin 0.5mg /kg TBW SQ every 24 hours based on BMI being >30.  DESCRIPTION: Pharmacy has adjusted enoxaparin dose per [facility] policy.  Patient is now receiving enoxaparin 60 mg every 24 hours    Berta Minor, PharmD Clinical Pharmacist  01/05/2021 8:22 AM

## 2021-01-05 NOTE — Progress Notes (Signed)
2nd attempt to visit with patient. Nurse had just gave pain meds, he was not willing to talk at the time.

## 2021-01-05 NOTE — Evaluation (Addendum)
Physical Therapy Evaluation Patient Details Name: Eddie Carter MRN: [id number] DOB: March 16, 1941 Today's Date: 01/05/2021   History of Present Illness  80 y.o. male with medical history significant of diet-controlled diabetes, COPD, asthma, stroke, GERD, hypothyroidism, depression, anxiety, OSA on CPAP, IBS, urethral stricture, diverticulitis, GI bleeding, BPH, interstitial lung disease, dementia, who presents with abdominal pain, chest pain, generalized weakness.  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  Clinical Impression  PT evaluation completed. Patient reports he uses a wheelchair as primary means of mobility at baseline and lives at home alone. Patient currently requires significant assistance with bed mobility and scooting transfers in bed. Patient has generalized weakness and will likely require physical assistance with mobility at discharge. Recommend to continue PT to maximize independence and address functional limitations listed below. SNF is recommended at this time for continued PT efforts.     Follow Up Recommendations SNF    Equipment Recommendations  None recommended by PT    Recommendations for Other Services       Precautions / Restrictions Precautions Precautions: Fall Restrictions Weight Bearing Restrictions: No      Mobility  Bed Mobility Overal bed mobility: Needs Assistance Bed Mobility: Supine to Sit;Sit to Supine     Supine to sit: Max assist;HOB elevated Sit to supine: Max assist   General bed mobility comments: assistance for LE and trunk support. verbal cues for sequencing and technique. extra time required for motor planning    Transfers Overall transfer level: Needs assistance   Transfers: Lateral/Scoot Transfers          Lateral/Scoot Transfers: Max assist General transfer comment: Max A for incremental scooting transfer performed along edge of bed x 4 bouts. verbal cues for LE and  hand positioning and technique. patient fatigued with activity. unable to stand at this time due to fatigue with minimal activity  Ambulation/Gait                Stairs            Wheelchair Mobility    Modified Rankin (Stroke Patients Only)       Balance Overall balance assessment: Needs assistance Sitting-balance support: Feet supported Sitting balance-Leahy Scale: Poor Sitting balance - Comments: poor balance initially progressing to fair with increased sitting time. bilateral UE support and minimal assistance to maintain midline required initially progressing to no UE support and supervision for safety                                     Pertinent Vitals/Pain Pain Assessment: Faces Faces Pain Scale: Hurts a little bit Pain Location: trunk Pain Descriptors / Indicators: Sore Pain Intervention(s): Limited activity within patient's tolerance    Home Living Family/patient expects to be discharged to:: Private residence Living Arrangements: Alone Available Help at Discharge:  (patient reports he will have no help from family at discharge) Type of Home: House Home Access: Ramped entrance     Home Layout: One level Home Equipment: [place] - 2 wheels;Walker - 4 wheels;Bedside commode;Shower seat;Wheelchair - power      Prior Function Level of Independence: Needs assistance   Gait / Transfers Assistance Needed: patient uses a wheelchair for primary means of mobility.  ADL's / Homemaking Assistance Needed: patient reports difficulty with ADLs but performing independently        Hand Dominance   Dominant Hand: Right  Extremity/Trunk Assessment   Upper Extremity Assessment Upper Extremity Assessment: Generalized weakness    Lower Extremity Assessment Lower Extremity Assessment: Generalized weakness (patient reports chronic LE weakness at baseline)       Communication   Communication: No difficulties  Cognition Arousal/Alertness:  Lethargic Behavior During Therapy: Flat affect                                   General Comments: patient is able to follow single step commands with extra time.      General Comments      Exercises     Assessment/Plan    PT Assessment Patient needs continued PT services  PT Problem List   Decreased strength;Decreased activity tolerance;Decreased balance;Decreased mobility;Decreased safety awareness;Decreased knowledge of precautions;       PT Treatment Interventions   DME instruction;Gait training;Functional mobility training;Therapeutic activities;Therapeutic exercise;Balance training;Neuromuscular re-education;   PT Goals (Current goals can be found in the Care Plan section)  Acute Rehab PT Goals Patient Stated Goal: to get stronger PT Goal Formulation: With patient Time For Goal Achievement: 01/19/21 Potential to Achieve Goals: Fair    Frequency  Min 2X/week   Barriers to discharge  Decreased caregiver support      Co-evaluation               AM-PAC PT "6 Clicks" Mobility  Outcome Measure Help needed turning from your back to your side while in a flat bed without using bedrails?: A Lot Help needed moving from lying on your back to sitting on the side of a flat bed without using bedrails?: A Lot Help needed moving to and from a bed to a chair (including a wheelchair)?: A Lot Help needed standing up from a chair using your arms (e.g., wheelchair or bedside chair)?: Total Help needed to walk in hospital room?: Total Help needed climbing 3-5 steps with a railing? : Total 6 Click Score: 9    End of Session   Activity Tolerance: Patient limited by fatigue Patient left: in bed;with call bell/phone within reach;with bed alarm set Nurse Communication: Mobility status      Time: 9381-8299 PT Time Calculation (min) (ACUTE ONLY): 18 min   Charges:   PT Evaluation $PT Eval Moderate Complexity: 1 Mod PT Treatments $Therapeutic Activity: 8-22  mins        Minna Merritts, PT, MPT  [name] 01/05/2021, 11:22 AM

## 2021-01-05 NOTE — Progress Notes (Signed)
PROGRESS NOTE    Eddie Carter  TOI:[id number] DOB: Mar 30, 1941 DOA: 01/03/2021 PCP: Eddie Athens, MD   Chief complaint.  Chest pain and abdominal pain. Brief Narrative:  Eddie Carter a 80 y.o.malewith medical history significant ofdiet-controlled diabetes, COPD, asthma, stroke, GERD, hypothyroidism, depression, anxiety, OSA on CPAP, IBS, urethral stricture, diverticulitis, GI bleeding, BPH, interstitial lung disease, dementia, who presents with abdominal pain, chest pain, generalized weakness. 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  Lung mass with liver metastasis. Severe abdominal pain and chest pain secondary to cancer. Brain metastasis. Reviewed CT angiogram of the chest, personally reviewed images, also went through the images with patient's son, a RN, patient has lung nodules in addition to large mass in the left lower lobe. MRI of the brain showed 2 small brain lesion with metastasis. Patient is followed by oncology. I will discontinue heparin drip at the patient CT angiogram did not show any PE. Patient is somehow sleepy today, I will reduce the dose of scheduled OxyContin to 10 mg twice a day.  #2.  Interstitial lung disease and COPD. Still stable without hypoxemia.  3.  Type 2 diabetes.  Well-controlled  4.  Chronic dementia. Follow-up  5.  Lactic acidosis secondary to cancer.  #6. Generalized weakness. Per patient's son, patient has significant weakness before  admission.  He was unable to transfer to bedside.  Not able to walk.  Will obtain PT and OT.  Patient may need nursing home placement.      DVT prophylaxis: Heparin Code Status: DNR Family Communication: Son updated Disposition Plan:  .   Status is: Inpatient  Remains inpatient appropriate because:Inpatient level of care appropriate due to severity of illness   Dispo: The patient is from: Home              Anticipated d/c is to: SNF              Anticipated d/c date is: 3 days              Patient currently is not medically stable to d/c.   Difficult to place patient No        I/O last 3 completed shifts: In: 1208.4 [I.V.:1208.4] Out: 50 [Urine:50] No intake/output data recorded.     Consultants:   Oncology  Procedures: None  Antimicrobials:None  Subjective: Patient has some confusion this morning, he slept well through the night.  Pain was better controlled. Denies any short of breath or cough. Abdominal pain much better, no nausea vomiting. No fever chills. No dysuria hematuria pain No headache or dizziness Objective: Vitals:   01/04/21 1142 01/04/21 1543 01/04/21 2047 01/05/21 0132  BP: 116/61 122/65 (!) 96/53 124/65  Pulse: 77 79 82 84  Resp: 18 (!) 24 18 16   Temp: 98.5 F (36.9 C) 98.2 F (36.8 C) 98.8 F (37.1 C) 98.5 F (36.9 C)  TempSrc:  Oral    SpO2: 92% 93% 90% 91%  Weight:  Height:        Intake/Output Summary (Last 24 hours) at 01/05/2021 0916 Last data filed at 01/05/2021 0300 Gross per 24 hour  Intake 352.86 ml  Output --  Net 352.86 ml   Filed Weights   01/03/21 1104  Weight: 117.9 kg    Examination:  General exam: Appears calm and comfortable  Respiratory system: Clear to auscultation. Respiratory effort normal. Cardiovascular system: S1 & S2 heard, RRR. No JVD, murmurs, rubs, gallops or clicks. No pedal edema. Gastrointestinal system: Abdomen is nondistended, soft and nontender. No organomegaly or masses felt.  Normal bowel sounds heard. Central nervous system: Drowsy and oriented x2. No focal neurological deficits. Extremities: Symmetric 5 x 5 power. Skin: No rashes, lesions or ulcers Psychiatry: Mood & affect appropriate.     Data Reviewed: I have personally reviewed following labs and imaging studies  CBC: Recent Labs  Lab 01/03/21 1111 01/04/21 0604 01/05/21 0800  WBC 9.1 8.7 9.4  NEUTROABS 6.7  --   --   HGB 15.2 12.8* 14.2  HCT 47.0 39.6 44.4  MCV 91.1 92.1 93.1  PLT 230 197 132   Basic Metabolic Panel: Recent Labs  Lab 01/03/21 1111 01/04/21 0604  NA 135 137  K 3.8 4.2  CL 96* 99  CO2 26 26  GLUCOSE 93 92  BUN 10 13  CREATININE 0.90 0.89  CALCIUM 9.3 9.0   GFR: Estimated Creatinine Clearance: 90.7 mL/min (by C-G formula based on SCr of 0.89 mg/dL). Liver Function Tests: Recent Labs  Lab 01/03/21 1111 01/04/21 0604  AST 261* 229*  ALT 102* 83*  ALKPHOS 616* 528*  BILITOT 3.3* 2.7*  PROT 7.6 6.0*  ALBUMIN 3.4* 2.7*   Recent Labs  Lab 01/03/21 1111  LIPASE 45   No results for input(s): AMMONIA in the last 168 hours. Coagulation Profile: Recent Labs  Lab 01/03/21 1849  INR 1.2   Cardiac Enzymes: No results for input(s): CKTOTAL, CKMB, CKMBINDEX, TROPONINI in the last 168 hours. BNP (last 3 results) No results for input(s): PROBNP in the last 8760 hours. HbA1C: Recent Labs    01/04/21 0604  HGBA1C 5.0   CBG: Recent Labs  Lab 01/04/21 0816 01/04/21 1138 01/04/21 1557 01/04/21 2128  GLUCAP 88 135* 126* 100*   Lipid Profile: Recent Labs    01/04/21 0604  CHOL 171  HDL 19*  LDLCALC 116*  TRIG 178*  CHOLHDL 9.0   Thyroid Function Tests: No results for input(s): TSH, T4TOTAL, FREET4, T3FREE, THYROIDAB in the last 72 hours. Anemia Panel: No results for input(s): VITAMINB12, FOLATE, FERRITIN, TIBC, IRON, RETICCTPCT in the last 72 hours. Sepsis Labs: Recent Labs  Lab 01/03/21 1111 01/03/21 1410  LATICACIDVEN 3.4* 2.5*    Recent  Results (from the past 240 hour(s))  Culture, blood (Routine X 2) w Reflex to ID Panel     Status: None (Preliminary result)   Collection Time: 01/03/21 11:11 AM   Specimen: BLOOD  Result Value Ref Range Status   Specimen Description BLOOD LEFT FOREARM  Final   Special Requests   Final    BOTTLES DRAWN AEROBIC AND ANAEROBIC Blood Culture adequate volume   Culture   Final    NO GROWTH 2 DAYS Performed at [facility], [address] [place], [place] [address]    Report Status PENDING  Incomplete  Culture, blood (Routine X 2) w Reflex to ID Panel     Status: None (Preliminary result)   Collection Time: 01/03/21 11:11 AM   Specimen:  BLOOD  Result Value Ref Range Status   Specimen Description BLOOD LEFT AC  Final   Special Requests   Final    BOTTLES DRAWN AEROBIC AND ANAEROBIC Blood Culture adequate volume   Culture   Final    NO GROWTH 2 DAYS Performed at [facility], [address], [place], [place] [address]    Report Status PENDING  Incomplete  Resp Panel by RT-PCR (Flu A&B, Covid) Nasopharyngeal Swab     Status: None   Collection Time: 01/03/21  3:55 PM   Specimen: Nasopharyngeal Swab; Nasopharyngeal(NP) swabs in vial transport medium  Result Value Ref Range Status   SARS Coronavirus 2 by RT PCR NEGATIVE NEGATIVE Final    Comment: (NOTE) SARS-CoV-2 target nucleic acids are NOT DETECTED.  The SARS-CoV-2 RNA is generally detectable in upper respiratory specimens during the acute phase of infection. The lowest concentration of SARS-CoV-2 viral copies this assay can detect is 138 copies/mL. A negative result does not preclude SARS-Cov-2 infection and should not be used as the sole basis for treatment or other patient management decisions. A negative result may occur with  improper specimen collection/handling, submission of specimen other than nasopharyngeal swab, presence of viral mutation(s) within the areas targeted by this assay, and inadequate number  of viral copies(<138 copies/mL). A negative result must be combined with clinical observations, patient history, and epidemiological information. The expected result is Negative.  Fact Sheet for Patients:  EntrepreneurPulse.com.au  Fact Sheet for Healthcare Providers:  IncredibleEmployment.be  This test is no t yet approved or cleared by the [place] FDA and  has been authorized for detection and/or diagnosis of SARS-CoV-2 by FDA under an Emergency Use Authorization (EUA). This EUA will remain  in effect (meaning this test can be used) for the duration of the COVID-19 declaration under Section 564(b)(1) of the Act, 21 U.S.C.section 360bbb-3(b)(1), unless the authorization is terminated  or revoked sooner.       Influenza A by PCR NEGATIVE NEGATIVE Final   Influenza B by PCR NEGATIVE NEGATIVE Final    Comment: (NOTE) The Xpert Xpress SARS-CoV-2/FLU/RSV plus assay is intended as an aid in the diagnosis of influenza from Nasopharyngeal swab specimens and should not be used as a sole basis for treatment. Nasal washings and aspirates are unacceptable for Xpert Xpress SARS-CoV-2/FLU/RSV testing.  Fact Sheet for Patients: EntrepreneurPulse.com.au  Fact Sheet for Healthcare Providers: IncredibleEmployment.be  This test is not yet approved or cleared by the [place] FDA and has been authorized for detection and/or diagnosis of SARS-CoV-2 by FDA under an Emergency Use Authorization (EUA). This EUA will remain in effect (meaning this test can be used) for the duration of the COVID-19 declaration under Section 564(b)(1) of the Act, 21 U.S.C. section 360bbb-3(b)(1), unless the authorization is terminated or revoked.  Performed at [facility], [address], [place], [place] [address]          Radiology Studies: CT ANGIO CHEST PE W OR WO CONTRAST  Result Date: 01/04/2021 CLINICAL  DATA:  Left lung mass noted on CT scan of the abdomen from yesterday. EXAM: CT ANGIOGRAPHY CHEST WITH CONTRAST TECHNIQUE: Multidetector CT imaging of the chest was performed using the standard protocol during bolus administration of intravenous contrast. Multiplanar CT image reconstructions and MIPs were obtained to evaluate the vascular anatomy. CONTRAST:  43mL OMNIPAQUE IOHEXOL 350 MG/ML SOLN COMPARISON:  CT abdomen/pelvis 01/03/2021. FINDINGS: Cardiovascular: High mild cardiac enlargement but no pericardial effusion. Moderate atherosclerotic calcifications involving the aorta no focal  aneurysm or dissection. Scattered coronary artery calcifications. The pulmonary arteries are unremarkable. No findings for pulmonary embolism. Mediastinum/Nodes: Scattered borderline enlarged mediastinal and hilar lymph nodes. Prevascular lymph node on image number 33/4 measures 9.5 mm. Small AP window nodes are less than 6 mm. Left hilar node on image number 49/4 measures 8 mm. Left-sided subcarinal lymph node on image 58/4 measures 10.5 mm. The esophagus is grossly normal.  The thyroid gland is unremarkable. Lungs/Pleura: As demonstrated on the recent abdominal CT scan there is a lobulated appearing mass in the left lower lobe. No air bronchograms to suggest this is an infiltrate. It measures approximately 6.5 x 5.2 cm and is worrisome for neoplasm. 10 mm nodule in the left upper lobe on image number 47/6. 8 mm subpleural nodule in the left lower lobe on image number 58/6. 5.5 mm right lower lobe pulmonary nodule on image number 50/6. Findings consistent with pulmonary metastatic disease. Underlying emphysematous changes and pulmonary scarring. Upper Abdomen: Innumerable hepatic metastatic lesions are noted. Musculoskeletal: No chest wall mass, supraclavicular or axillary lymphadenopathy the. The bony thorax is intact. No findings suspicious for osseous metastatic disease. Review of the MIP images confirms the above findings.  IMPRESSION: 1. 6.5 x 5.2 cm left lower lobe pulmonary mass worrisome for neoplasm. There are borderline enlarged hilar and mediastinal lymph nodes suspicious for metastatic adenopathy. 2. Bilateral pulmonary nodules consistent with pulmonary metastatic disease. 3. Innumerable hepatic metastatic lesions. 4. No findings suspicious for osseous metastatic disease. 5. PET-CT may be helpful for further evaluation and staging purposes. 6. Emphysema and aortic atherosclerosis. Aortic Atherosclerosis (ICD10-I70.0) and Emphysema (ICD10-J43.9). Electronically Signed   By: Marijo Sanes M.D.   On: 01/04/2021 15:33   MR BRAIN W WO CONTRAST  Result Date: 01/04/2021 CLINICAL DATA:  Non-small cell lung cancer.  Staging. EXAM: MRI HEAD WITHOUT AND WITH CONTRAST TECHNIQUE: Multiplanar, multiecho pulse sequences of the brain and surrounding structures were obtained without and with intravenous contrast. CONTRAST:  107mL GADAVIST GADOBUTROL 1 MMOL/ML IV SOLN COMPARISON:  Head CT 07/13/2017 FINDINGS: Brain: Axial and coronal postcontrast imaging are markedly degraded by patient motion. No focal abnormality affects the brainstem or cerebellum. Within the cerebral hemispheres, there is a punctate focus of restricted diffusion adjacent to the posterior body of the left lateral ventricle image 79. There is a punctate focus of restricted diffusion in a left parietal vertex gyrus, image 88. The diffusion findings are suspicious for tiny metastases, but that cannot be confirmed because of the motion degradation. Consider repeat axial and coronal imaging when the patient is able to tolerate the procedure better. Otherwise, there are mild chronic small-vessel ischemic changes of the hemispheric white matter. No cortical or large vessel territory infarction. No sign of hemorrhage, hydrocephalus or extra-axial collection. Vascular: Major vessels at the base of the brain show flow. Skull and upper cervical spine: Negative Sinuses/Orbits:  Clear/normal Other: None IMPRESSION: 1. Markedly motion degraded exam. Axial and coronal postcontrast imaging was markedly degraded and essentially nondiagnostic. 2. Two punctate foci of restricted diffusion in the left cerebral hemisphere, one adjacent to the posterior body of the left lateral ventricle and the other in a left parietal vertex gyrus. The diffusion findings are suspicious for tiny metastases, but that cannot be confirmed because of the motion degradation. Consider repeat axial and coronal postcontrast imaging when the patient is able to tolerate the procedure. Electronically Signed   By: Nelson Chimes M.D.   On: 01/04/2021 19:26   CT ABDOMEN PELVIS W CONTRAST  Result  Date: 01/03/2021 CLINICAL DATA:  Right lower quadrant abdominal pain. EXAM: CT ABDOMEN AND PELVIS WITH CONTRAST TECHNIQUE: Multidetector CT imaging of the abdomen and pelvis was performed using the standard protocol following bolus administration of intravenous contrast. CONTRAST:  122mL OMNIPAQUE IOHEXOL 300 MG/ML  SOLN COMPARISON:  October 29, 2014. FINDINGS: Lower chest: 5.6 x 4.4 cm lobulated mass is noted in the left lower lobe laterally concerning for malignancy. Hepatobiliary: No gallstones or biliary dilatation is noted. Multiple rounded low densities are noted throughout the liver of varying sizes consistent with diffuse hepatic metastases. The largest measures 7.1 x 4.3 cm in the left hepatic lobe. Pancreas: Unremarkable. No pancreatic ductal dilatation or surrounding inflammatory changes. Spleen: Normal in size without focal abnormality. Adrenals/Urinary Tract: Adrenal glands are unremarkable. Kidneys are normal, without renal calculi, focal lesion, or hydronephrosis. Bladder is unremarkable. Stomach/Bowel: Stomach appears normal. Status post appendectomy. There is no evidence of bowel obstruction or inflammation. Vascular/Lymphatic: Atherosclerosis of abdominal aorta is noted without aneurysm or dissection. 2.4 cm left  periaortic lymph node is noted concerning for metastatic disease. Reproductive: Prostate is unremarkable. Other: No abdominal wall hernia or abnormality. No abdominopelvic ascites. Musculoskeletal: No acute or significant osseous findings. 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 IMPRESSION: 1. 5.6 x 4.4 cm lobulated mass is noted in the left lower lobe laterally concerning for malignancy. 2. Multiple rounded low densities are noted throughout the liver of varying sizes consistent with diffuse hepatic metastases. The largest measures 7.1 x 4.3 cm in the left hepatic lobe. 3. 2.4 cm left periaortic lymph node is noted concerning for metastatic disease. 4. Aortic atherosclerosis. Aortic Atherosclerosis (ICD10-I70.0). Electronically Signed   By: Marijo Conception M.D.   On: 01/03/2021 13:51        Scheduled Meds: . cholecalciferol  1,000 Units Oral Daily  . donepezil  10 mg Oral QHS  . dutasteride  0.5 mg Oral Daily  . enoxaparin (LOVENOX) injection  0.5 mg/kg Subcutaneous Q24H  . fesoterodine  8 mg Oral Daily  . fluticasone  1 spray Each Nare Daily  . hydrOXYzine  20 mg Oral QHS  . levothyroxine  50 mcg Oral Q0600  . melatonin  3 mg Oral QHS  . mirabegron ER  25 mg Oral Daily  . mometasone-formoterol  2 puff Inhalation BID  . oxyCODONE  10 mg Oral Q12H  . tamsulosin  0.4 mg Oral Daily  . tiotropium  18 mcg Inhalation Daily  . topiramate  50 mg Oral BID  . vitamin B-12  1,000 mcg Oral Daily   Continuous Infusions:   LOS: 2 days    Time spent: 34 minutes, more than 50% of time involved in direct patient care.    Sharen Hones, MD [facility]   To contact the attending provider between 7A-7P or the covering provider during after hours 7P-7A, please log into the web site [email or web address] and access using universal [place] password for that web site. If  you do not have the password, please call the hospital operator.  01/05/2021, 9:16 AM

## 2021-01-06 ENCOUNTER — Inpatient Hospital Stay: Payer: Medicare PPO

## 2021-01-06 DIAGNOSIS — C787 Secondary malignant neoplasm of liver and intrahepatic bile duct: Secondary | ICD-10-CM | POA: Diagnosis not present

## 2021-01-06 DIAGNOSIS — F0390 Unspecified dementia without behavioral disturbance: Secondary | ICD-10-CM | POA: Diagnosis not present

## 2021-01-06 DIAGNOSIS — Z515 Encounter for palliative care: Secondary | ICD-10-CM | POA: Diagnosis not present

## 2021-01-06 DIAGNOSIS — R918 Other nonspecific abnormal finding of lung field: Secondary | ICD-10-CM | POA: Diagnosis not present

## 2021-01-06 DIAGNOSIS — Z7189 Other specified counseling: Secondary | ICD-10-CM

## 2021-01-06 DIAGNOSIS — C7A8 Other malignant neuroendocrine tumors: Secondary | ICD-10-CM

## 2021-01-06 DIAGNOSIS — J449 Chronic obstructive pulmonary disease, unspecified: Secondary | ICD-10-CM | POA: Diagnosis not present

## 2021-01-06 DIAGNOSIS — C7B8 Other secondary neuroendocrine tumors: Secondary | ICD-10-CM

## 2021-01-06 DIAGNOSIS — G893 Neoplasm related pain (acute) (chronic): Secondary | ICD-10-CM

## 2021-01-06 LAB — PROTIME-INR
INR: 1.3 — ABNORMAL HIGH (ref 0.8–1.2)
Prothrombin Time: 15.3 seconds — ABNORMAL HIGH (ref 11.4–15.2)

## 2021-01-06 MED ORDER — FENTANYL CITRATE (PF) 100 MCG/2ML IJ SOLN
INTRAMUSCULAR | Status: AC
  Filled 2021-01-06: qty 2

## 2021-01-06 MED ORDER — ENOXAPARIN SODIUM 60 MG/0.6ML ~~LOC~~ SOLN
0.5000 mg/kg | SUBCUTANEOUS | Status: DC
  Administered 2021-01-07 – 2021-01-18 (×10): 60 mg via SUBCUTANEOUS
  Filled 2021-01-06 (×10): qty 0.6

## 2021-01-06 MED ORDER — MIDAZOLAM HCL 2 MG/2ML IJ SOLN
INTRAMUSCULAR | Status: AC
  Filled 2021-01-06: qty 2

## 2021-01-06 NOTE — Consult Note (Signed)
[place]  [phone] [phone]   Name: Eddie Carter Date: 01/06/2021 MRN: [id number]  DOB: October 08, 1941  Patient Care Team: [name], MD as PCP - General (Cardiology) Jessy Oto, MD as Consulting Physician (Orthopedic Surgery) Telford Nab, RN as Oncology Nurse Navigator    REASON FOR CONSULTATION: Eddie Carter is a 80 y.o. male with multiple medical problems including COPD, OSA on CPAP, ILD, history of CVA, IBS, urethral stricture DM, and mild dementia.  Patient was admitted to the hospital on 01/03/2021 with persistent abdominal pain.  CT of the abdomen/pelvis revealed a left lower lobe mass and diffuse hepatic metastases.  MRI of the brain revealed two punctate foci in the left cerebral hemisphere concerning for tiny metastases.  Palliative care was consulted to help address goals.  SOCIAL HISTORY:     reports that he quit smoking about 24 years ago. His smoking use included cigarettes. He has a 60.00 pack-year smoking history. He has never used smokeless tobacco. He reports that he does not drink alcohol and does not use drugs.  Patient was living at home alone.  He recently divorced his wife after being married for 55 years.  He has a son who is a Equities trader.  ADVANCE DIRECTIVES:  None on file  CODE STATUS: DNR  PAST MEDICAL HISTORY: Past Medical History:  Diagnosis Date  . Arthritis   . Asthma   . BPH (benign prostatic hyperplasia)   . 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 Complication of anesthesia    after septoplasty and uvulectomy-was in icu- [facility] - 10 yrs. ago  . COPD (chronic obstructive pulmonary disease) ([place])   . Depression   . Diabetes mellitus without complication ([place])   . Diverticulitis   . ED (erectile dysfunction)   . Full dentures   . GERD (gastroesophageal reflux disease)    no meds  . Headache    difficulty with headaches- 1950's , again in 1967,  2008- again problems with migrraines   . History of GI bleed   . History of multiple pulmonary nodules   . History of shingles   . History of urethral stricture   . HOH (hard of hearing)   . HOH (hard of hearing)   . Hypogonadism in male   . IBS (irritable bowel syndrome)   . Joint pain   . Medial meniscus, posterior horn derangement    left knee  . Melanosis coli   . Obesity   . Pneumonia 2014   [facility]  . Shortness of breath   . Skin cancer   . Sleep apnea    uses a cpap, most nights   . Stroke [facility]) 1995   some speech and thought processes slow  . Traumatic tear of lateral meniscus of right knee    right knee  . Urinary frequency   . Urinary incontinence     PAST SURGICAL HISTORY:  Past Surgical History:  Procedure Laterality Date  . APPENDECTOMY  1963  . BACK SURGERY    . CARPAL TUNNEL RELEASE     left  . CATARACT EXTRACTION W/PHACO Left 09/13/2018   Procedure: CATARACT EXTRACTION PHACO AND INTRAOCULAR LENS PLACEMENT (IOC);  Surgeon: Birder Robson, MD;  Location: [facility] ORS;  Service: Ophthalmology;  Laterality: Left;  [place]  00:42 CDE 6.73 Fluid pack lot #[id number]  . CATARACT EXTRACTION W/PHACO Right 11/23/2019   Procedure: CATARACT EXTRACTION PHACO AND INTRAOCULAR LENS PLACEMENT (  IOC) RIGHT;  Surgeon: Birder Robson, MD;  Location: [facility] ORS;  Service: Ophthalmology;  Laterality: Right;  [place] 01:10.1 CDE 10.25 Fluid Pack Lot # [id number]  . COLONOSCOPY    . COLONOSCOPY WITH PROPOFOL N/A 12/07/2016   Procedure: COLONOSCOPY WITH PROPOFOL;  Surgeon: Lollie Sails, MD;  Location[facility] ENDOSCOPY;  Service: Endoscopy;  Laterality: N/A;  . COLONOSCOPY WITH PROPOFOL N/A 06/26/2019   Procedure: COLONOSCOPY WITH PROPOFOL;  Surgeon: Lollie Sails, MD;  Location: [facility] ENDOSCOPY;  Service: Endoscopy;  Laterality: N/A;  . EYE SURGERY     foreign body removed fr. eye, ? side   . FOOT FOREIGN BODY REMOVAL  1976   left foot -multiple pieces glass  . HEMORROIDECTOMY  1995   . KNEE ARTHROSCOPY Bilateral 01/17/2013   Procedure: ARTHROSCOPY KNEE BILATERAL WITH MEDIAL AND LATERAL MENISECTOMIES;  Surgeon: Lorn Junes, MD;  Location: [name];  Service: Orthopedics;  Laterality: Bilateral;  . KYPHOPLASTY N/A 07/23/2017   Procedure: KYPHOPLASTY- L1;  Surgeon: Hessie Knows, MD;  Location: [facility] ORS;  Service: Orthopedics;  Laterality: N/A;  . LUMBAR LAMINECTOMY N/A 11/05/2014   Procedure: LEFT L3-4 MICRODISCECTOMY;  Surgeon: Jessy Oto, MD;  Location: [place];  Service: Orthopedics;  Laterality: N/A;  . LUMBAR LAMINECTOMY/DECOMPRESSION MICRODISCECTOMY N/A 02/12/2015   Procedure: RE-DO LEFT L3-4 MICRODISCECTOMY;  Surgeon: Jessy Oto, MD;  Location: [place];  Service: Orthopedics;  Laterality: N/A;  . NASAL SEPTUM SURGERY  1995  . TONSILLECTOMY    . UPPER GI ENDOSCOPY    . UVULOPALATOPHARYNGOPLASTY (UPPP)/TONSILLECTOMY/SEPTOPLASTY  1995   same time with septoplasty-ended up ICU almost trached    HEMATOLOGY/ONCOLOGY HISTORY:  Oncology History   No history exists.    ALLERGIES:  is allergic to asa [aspirin], bee venom, nsaids, and tolmetin.  MEDICATIONS:  Current Facility-Administered Medications  Medication Dose Route Frequency Provider Last Rate Last Admin  . albuterol (VENTOLIN HFA) 108 (90 Base) MCG/ACT inhaler 2 puff  2 puff Inhalation Q4H PRN Ivor Costa, MD      . cholecalciferol (VITAMIN D) tablet 1,000 Units  1,000 Units Oral Daily Ivor Costa, MD   1,000 Units at 01/05/21 1055  . dextromethorphan-guaiFENesin (MUCINEX DM) 30-600 MG per 12 hr tablet 1 tablet  1 tablet Oral BID PRN Ivor Costa, MD      . donepezil (ARICEPT) tablet 10 mg  10 mg Oral QHS Ivor Costa, MD   10 mg at 01/05/21 2108  . dutasteride (AVODART) capsule 0.5 mg  0.5 mg Oral Daily Ivor Costa, MD   0.5 mg at 01/05/21 1055  . [START ON 01/07/2021] enoxaparin (LOVENOX) injection 60 mg  0.5 mg/kg Subcutaneous Q24H Dorothe Pea, RPH      . fesoterodine (TOVIAZ) tablet 8 mg   8 mg Oral Daily Ivor Costa, MD   8 mg at 01/05/21 1055  . fluticasone (FLONASE) 50 MCG/ACT nasal spray 1 spray  1 spray Each Nare Daily Ivor Costa, MD   1 spray at 01/05/21 1055  . hydrALAZINE (APRESOLINE) injection 5 mg  5 mg Intravenous Q2H PRN Ivor Costa, MD      . hydrOXYzine (ATARAX/VISTARIL) tablet 10 mg  10 mg Oral TID PRN Ivor Costa, MD      . hydrOXYzine (ATARAX/VISTARIL) tablet 20 mg  20 mg Oral QHS Ivor Costa, MD   20 mg at 01/05/21 2107  . hydrOXYzine (VISTARIL) injection 25 mg  25 mg Intramuscular Q6H PRN Ivor Costa, MD      . levothyroxine (SYNTHROID)  tablet 50 mcg  50 mcg Oral Q0600 Ivor Costa, MD   50 mcg at 01/06/21 0601  . melatonin tablet 3 mg  3 mg Oral QHS Ivor Costa, MD   3 mg at 01/05/21 2107  . mirabegron ER (MYRBETRIQ) tablet 25 mg  25 mg Oral Daily Ivor Costa, MD   25 mg at 01/05/21 1055  . mometasone-formoterol (DULERA) 200-5 MCG/ACT inhaler 2 puff  2 puff Inhalation BID Ivor Costa, MD   2 puff at 01/05/21 2109  . oxyCODONE (Oxy IR/ROXICODONE) immediate release tablet 5 mg  5 mg Oral Q6H PRN Ivor Costa, MD   5 mg at 01/04/21 0750  . oxyCODONE (OXYCONTIN) 12 hr tablet 10 mg  10 mg Oral Q12H Sharen Hones, MD   10 mg at 01/06/21 1026  . senna-docusate (Senokot-S) tablet 2 tablet  2 tablet Oral BID Sharen Hones, MD   2 tablet at 01/05/21 2107  . tamsulosin (FLOMAX) capsule 0.4 mg  0.4 mg Oral Daily Ivor Costa, MD   0.4 mg at 01/05/21 1055  . tiotropium (SPIRIVA) inhalation capsule ([facility] use ONLY) 18 mcg  18 mcg Inhalation Daily Ivor Costa, MD   18 mcg at 01/05/21 1056  . topiramate (TOPAMAX) tablet 50 mg  50 mg Oral BID Ivor Costa, MD   50 mg at 01/05/21 2108  . vitamin B-12 (CYANOCOBALAMIN) tablet 1,000 mcg  1,000 mcg Oral Daily Ivor Costa, MD   1,000 mcg at 01/05/21 1055    VITAL SIGNS: BP 109/60 (BP Location: Right Arm)   Pulse 75   Temp 99 F (37.2 C) (Oral)   Resp 16   Ht 6' (1.829 m)   Wt 260 lb (117.9 kg)   SpO2 93%   BMI 35.26 kg/m  Filed Weights    01/03/21 1104  Weight: 260 lb (117.9 kg)    Estimated body mass index is 35.26 kg/m as calculated from the following:   Height as of this encounter: 6' (1.829 m).   Weight as of this encounter: 260 lb (117.9 kg).  LABS: CBC:    Component Value Date/Time   WBC 9.4 01/05/2021 0800   HGB 14.2 01/05/2021 0800   HGB 17.5 10/28/2014 2234   HCT 44.4 01/05/2021 0800   HCT 44.1 01/16/2016 0843   PLT 185 01/05/2021 0800   PLT 189 10/28/2014 2234   MCV 93.1 01/05/2021 0800   MCV 94 10/28/2014 2234   NEUTROABS 6.7 01/03/2021 1111   NEUTROABS 13.2 (H) 07/28/2013 0643   LYMPHSABS 1.4 01/03/2021 1111   LYMPHSABS 1.6 07/28/2013 0643   MONOABS 0.8 01/03/2021 1111   MONOABS 0.8 07/28/2013 0643   EOSABS 0.1 01/03/2021 1111   EOSABS 0.0 07/28/2013 0643   BASOSABS 0.1 01/03/2021 1111   BASOSABS 0.0 07/28/2013 0643   Comprehensive Metabolic Panel:    Component Value Date/Time   NA 137 01/04/2021 0604   NA 138 10/28/2014 2234   K 4.2 01/04/2021 0604   K 3.9 10/28/2014 2234   CL 99 01/04/2021 0604   CL 100 10/28/2014 2234   CO2 26 01/04/2021 0604   CO2 28 10/28/2014 2234   BUN 13 01/04/2021 0604   BUN 22 (H) 10/28/2014 2234   CREATININE 0.89 01/04/2021 0604   CREATININE 1.09 06/05/2020 1210   GLUCOSE 92 01/04/2021 0604   GLUCOSE 126 (H) 10/28/2014 2234   CALCIUM 9.0 01/04/2021 0604   CALCIUM 8.9 10/28/2014 2234   AST 229 (H) 01/04/2021 0604   AST 37 10/28/2014 2234   ALT 83 (  H) 01/04/2021 0604   ALT 69 (H) 10/28/2014 2234   ALKPHOS 528 (H) 01/04/2021 0604   ALKPHOS 69 10/28/2014 2234   BILITOT 2.7 (H) 01/04/2021 0604   BILITOT 0.3 01/16/2016 0843   BILITOT 0.7 10/28/2014 2234   PROT 6.0 (L) 01/04/2021 0604   PROT 6.4 01/16/2016 0843   PROT 7.2 10/28/2014 2234   ALBUMIN 2.7 (L) 01/04/2021 0604   ALBUMIN 3.9 01/16/2016 0843   ALBUMIN 3.8 10/28/2014 2234    RADIOGRAPHIC STUDIES: CT ANGIO CHEST PE W OR WO CONTRAST  Result Date: 01/04/2021 CLINICAL DATA:  Left lung mass  noted on CT scan of the abdomen from yesterday. EXAM: CT ANGIOGRAPHY CHEST WITH CONTRAST TECHNIQUE: Multidetector CT imaging of the chest was performed using the standard protocol during bolus administration of intravenous contrast. Multiplanar CT image reconstructions and MIPs were obtained to evaluate the vascular anatomy. CONTRAST:  64mL OMNIPAQUE IOHEXOL 350 MG/ML SOLN COMPARISON:  CT abdomen/pelvis 01/03/2021. FINDINGS: Cardiovascular: High mild cardiac enlargement but no pericardial effusion. Moderate atherosclerotic calcifications involving the aorta no focal aneurysm or dissection. Scattered coronary artery calcifications. The pulmonary arteries are unremarkable. No findings for pulmonary embolism. Mediastinum/Nodes: Scattered borderline enlarged mediastinal and hilar lymph nodes. Prevascular lymph node on image number 33/4 measures 9.5 mm. Small AP window nodes are less than 6 mm. Left hilar node on image number 49/4 measures 8 mm. Left-sided subcarinal lymph node on image 58/4 measures 10.5 mm. The esophagus is grossly normal.  The thyroid gland is unremarkable. Lungs/Pleura: As demonstrated on the recent abdominal CT scan there is a lobulated appearing mass in the left lower lobe. No air bronchograms to suggest this is an infiltrate. It measures approximately 6.5 x 5.2 cm and is worrisome for neoplasm. 10 mm nodule in the left upper lobe on image number 47/6. 8 mm subpleural nodule in the left lower lobe on image number 58/6. 5.5 mm right lower lobe pulmonary nodule on image number 50/6. Findings consistent with pulmonary metastatic disease. Underlying emphysematous changes and pulmonary scarring. Upper Abdomen: Innumerable hepatic metastatic lesions are noted. Musculoskeletal: No chest wall mass, supraclavicular or axillary lymphadenopathy the. The bony thorax is intact. No findings suspicious for osseous metastatic disease. Review of the MIP images confirms the above findings. IMPRESSION: 1. 6.5 x 5.2  cm left lower lobe pulmonary mass worrisome for neoplasm. There are borderline enlarged hilar and mediastinal lymph nodes suspicious for metastatic adenopathy. 2. Bilateral pulmonary nodules consistent with pulmonary metastatic disease. 3. Innumerable hepatic metastatic lesions. 4. No findings suspicious for osseous metastatic disease. 5. PET-CT may be helpful for further evaluation and staging purposes. 6. Emphysema and aortic atherosclerosis. Aortic Atherosclerosis (ICD10-I70.0) and Emphysema (ICD10-J43.9). Electronically Signed   By: Marijo Sanes M.D.   On: 01/04/2021 15:33   MR BRAIN W WO CONTRAST  Result Date: 01/04/2021 CLINICAL DATA:  Non-small cell lung cancer.  Staging. EXAM: MRI HEAD WITHOUT AND WITH CONTRAST TECHNIQUE: Multiplanar, multiecho pulse sequences of the brain and surrounding structures were obtained without and with intravenous contrast. CONTRAST:  48mL GADAVIST GADOBUTROL 1 MMOL/ML IV SOLN COMPARISON:  Head CT 07/13/2017 FINDINGS: Brain: Axial and coronal postcontrast imaging are markedly degraded by patient motion. No focal abnormality affects the brainstem or cerebellum. Within the cerebral hemispheres, there is a punctate focus of restricted diffusion adjacent to the posterior body of the left lateral ventricle image 79. There is a punctate focus of restricted diffusion in a left parietal vertex gyrus, image 88. The diffusion findings are suspicious for tiny metastases,  but that cannot be confirmed because of the motion degradation. Consider repeat axial and coronal imaging when the patient is able to tolerate the procedure better. Otherwise, there are mild chronic small-vessel ischemic changes of the hemispheric white matter. No cortical or large vessel territory infarction. No sign of hemorrhage, hydrocephalus or extra-axial collection. Vascular: Major vessels at the base of the brain show flow. Skull and upper cervical spine: Negative Sinuses/Orbits: Clear/normal Other: None  IMPRESSION: 1. Markedly motion degraded exam. Axial and coronal postcontrast imaging was markedly degraded and essentially nondiagnostic. 2. Two punctate foci of restricted diffusion in the left cerebral hemisphere, one adjacent to the posterior body of the left lateral ventricle and the other in a left parietal vertex gyrus. The diffusion findings are suspicious for tiny metastases, but that cannot be confirmed because of the motion degradation. Consider repeat axial and coronal postcontrast imaging when the patient is able to tolerate the procedure. Electronically Signed   By: Nelson Chimes M.D.   On: 01/04/2021 19:26   CT ABDOMEN PELVIS W CONTRAST  Result Date: 01/03/2021 CLINICAL DATA:  Right lower quadrant abdominal pain. EXAM: CT ABDOMEN AND PELVIS WITH CONTRAST TECHNIQUE: Multidetector CT imaging of the abdomen and pelvis was performed using the standard protocol following bolus administration of intravenous contrast. CONTRAST:  181mL OMNIPAQUE IOHEXOL 300 MG/ML  SOLN COMPARISON:  October 29, 2014. FINDINGS: Lower chest: 5.6 x 4.4 cm lobulated mass is noted in the left lower lobe laterally concerning for malignancy. Hepatobiliary: No gallstones or biliary dilatation is noted. Multiple rounded low densities are noted throughout the liver of varying sizes consistent with diffuse hepatic metastases. The largest measures 7.1 x 4.3 cm in the left hepatic lobe. Pancreas: Unremarkable. No pancreatic ductal dilatation or surrounding inflammatory changes. Spleen: Normal in size without focal abnormality. Adrenals/Urinary Tract: Adrenal glands are unremarkable. Kidneys are normal, without renal calculi, focal lesion, or hydronephrosis. Bladder is unremarkable. Stomach/Bowel: Stomach appears normal. Status post appendectomy. There is no evidence of bowel obstruction or inflammation. Vascular/Lymphatic: Atherosclerosis of abdominal aorta is noted without aneurysm or dissection. 2.4 cm left periaortic lymph node is  noted concerning for metastatic disease. Reproductive: Prostate is unremarkable. Other: No abdominal wall hernia or abnormality. No abdominopelvic ascites. Musculoskeletal: No acute or significant osseous findings. 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 IMPRESSION: 1. 5.6 x 4.4 cm lobulated mass is noted in the left lower lobe laterally concerning for malignancy. 2. Multiple rounded low densities are noted throughout the liver of varying sizes consistent with diffuse hepatic metastases. The largest measures 7.1 x 4.3 cm in the left hepatic lobe. 3. 2.4 cm left periaortic lymph node is noted concerning for metastatic disease. 4. Aortic atherosclerosis. Aortic Atherosclerosis (ICD10-I70.0). Electronically Signed   By: Marijo Conception M.D.   On: 01/03/2021 13:51   US BIOPSY (LIVER)  Result Date: 01/06/2021 INDICATION: Lung and multiple liver lesions suspicious for metastatic disease EXAM: Ultrasound-guided biopsy of liver lesions MEDICATIONS: None. ANESTHESIA/SEDATION: None COMPLICATIONS: None immediate. PROCEDURE: Informed written consent was obtained from the patient after a thorough discussion of the procedural risks, benefits and alternatives. All questions were addressed. Maximal Sterile Barrier Technique was utilized including caps, mask, sterile gowns, sterile gloves, sterile drape, hand hygiene and skin antiseptic. A timeout was performed prior to the initiation of the procedure. Patient position supine on the ultrasound table. Epigastric skin prepped and draped in usual sterile  fashion. Following local lidocaine administration, 17 gauge introducer needle was advanced into 1 of the left hepatic lobe lesions, and four 18 gauge cores were obtained utilizing continuous ultrasound guidance. Gelfoam slurry was administered through the introducer needle at the biopsy site. Samples  were sent to pathology in formalin. Needle removed and hemostasis achieved with 5 minutes of manual compression. Post procedure ultrasound images showed no evidence of significant hemorrhage. IMPRESSION: Ultrasound-guided biopsy of left liver lesion as above. Electronically Signed   By: Miachel Roux M.D.   On: 01/06/2021 13:46    PERFORMANCE STATUS (ECOG) : 3 - Symptomatic, >50% confined to bed  Review of Systems Unless otherwise noted, a complete review of systems is negative.  Physical Exam General: NAD Pulmonary: Unlabored Extremities: no edema, no joint deformities Skin: no rashes Neurological: Weakness, some confusion, drowsy  IMPRESSION: I attempted to speak with patient to address goals.  However, patient was fairly drowsy at the time of my visit.  He fell asleep several times while I attempted to speak with him.  He did seem to understand that he is presumed to have an advanced malignancy.  However, I was really unable to elicit his goals for treatment given.  I offered to call his son but patient stated that he was at work.  Per RN, patient has told her that he is ready to die.  Hospice could be considered if patient is not interested in pursuing treatment.  Patient is noted to have progressive weakness.  SNF has been recommended.  I would recommend that patient be followed at SNF by palliative care.  PLAN: -Continue current scope of treatment -Probable SNF with palliative care following -Will return and speak with patient when he is more awake to further discuss goals  Case and plan discussed with Dr. Janese Banks  Time Total: 30 minutes  Visit consisted of counseling and education dealing with the complex and emotionally intense issues of symptom management and palliative care in the setting of serious and potentially life-threatening illness.Greater than 50%  of this time was spent counseling and coordinating care related to the above assessment and plan.  Signed by: Altha Harm, PhD, NP-C

## 2021-01-06 NOTE — Plan of Care (Signed)

## 2021-01-06 NOTE — Progress Notes (Signed)
Physical Therapy Treatment Patient Details Name: Eddie Carter MRN: [id number] DOB: June 09, 1941 Today's Date: 01/06/2021    History of Present Illness 80 y.o. male with medical history significant of diet-controlled diabetes, COPD, asthma, stroke, GERD, hypothyroidism, depression, anxiety, OSA on CPAP, IBS, urethral stricture, diverticulitis, GI bleeding, BPH, interstitial lung disease, dementia, who presents with abdominal pain, chest pain, generalized weakness.  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    PT Comments    Patient agreeable to PT treatment. Patient remains delayed with verbal responses and increased time required to follow commands. Patient was able to stand x 1 bout with maximal assistance for sit to stand transfer after multiple unsuccessful standing attempts. Standing tolerance of approximately 20 seconds with assistance. Patient continues to require significant assistance for bed mobility also. Plan for SNF remains an appropriate discharge recommendation. Recommend to continue PT to maximize independence and address remaining functional limitations.    Follow Up Recommendations  SNF     Equipment Recommendations  None recommended by PT    Recommendations for Other Services       Precautions / Restrictions Precautions Precautions: Fall Restrictions Weight Bearing Restrictions: No    Mobility  Bed Mobility Overal bed mobility: Needs Assistance Bed Mobility: Supine to Sit;Sit to Supine     Supine to sit: Max assist;HOB elevated Sit to supine: Max assist   General bed mobility comments: verbal cues for sequencing and technique. increased time required to complete tasks    Transfers Overall transfer level: Needs assistance   Transfers: Sit to/from Stand Sit to Stand: Max assist        Lateral/Scoot Transfers: Max assist General transfer comment: patient unable to stand initially with mutliple  attempts with maximal assistance and cues for technique. patient was then able to stand and clear buttocks fully from bed surface with flexed trunk posture with maximal assistance for lifting and lowering. patient continues to require maximal assistance for scooting in seated position with maximal verbal cues for technique  Ambulation/Gait             General Gait Details: unable to progress to ambulation due to poor standing tolerance   Stairs             Wheelchair Mobility    Modified Rankin (Stroke Patients Only)       Balance Overall balance assessment: Needs assistance Sitting-balance support: Feet supported;Bilateral upper extremity supported Sitting balance-Leahy Scale: Fair     Standing balance support: Single extremity supported Standing balance-Leahy Scale: Poor Standing balance comment: Min A- Mod A to maintain standing balance with standing tolerance of less than 30 seconds                            Cognition Arousal/Alertness: Awake/alert;Lethargic Behavior During Therapy: Flat affect Overall Cognitive Status: No family/caregiver present to determine baseline cognitive functioning                                 General Comments: patient required increased time for motor planning and following commands. patient able to follow single step commands consistently with extra time. delayed verbal responses with communicaton      Exercises      General Comments        Pertinent Vitals/Pain Pain Assessment: Faces Faces Pain Scale: Hurts a little bit Pain Location: bilateral legs Pain  Descriptors / Indicators: Sore Pain Intervention(s): Limited activity within patient's tolerance    Home Living                      Prior Function            PT Goals (current goals can now be found in the care plan section) Acute Rehab PT Goals Patient Stated Goal: to be able to get to wheelchair PT Goal Formulation: With  patient Time For Goal Achievement: 01/19/21 Potential to Achieve Goals: Fair Progress towards PT goals: Progressing toward goals    Frequency    Min 2X/week      PT Plan Current plan remains appropriate    Co-evaluation              AM-PAC PT "6 Clicks" Mobility   Outcome Measure  Help needed turning from your back to your side while in a flat bed without using bedrails?: A Lot Help needed moving from lying on your back to sitting on the side of a flat bed without using bedrails?: A Lot Help needed moving to and from a bed to a chair (including a wheelchair)?: A Lot Help needed standing up from a chair using your arms (e.g., wheelchair or bedside chair)?: A Lot Help needed to walk in hospital room?: Total Help needed climbing 3-5 steps with a railing? : Total 6 Click Score: 10    End of Session   Activity Tolerance: Patient limited by fatigue Patient left: in bed;with call bell/phone within reach;with bed alarm set Nurse Communication: Mobility status PT Visit Diagnosis: Unsteadiness on feet (R26.81);Muscle weakness (generalized) (M62.81);Difficulty in walking, not elsewhere classified (R26.2)     Time: 6433-2951 PT Time Calculation (min) (ACUTE ONLY): 23 min  Charges:  $Therapeutic Activity: 23-37 mins                     Minna Merritts, PT, MPT    [name] 01/06/2021, 1:14 PM

## 2021-01-06 NOTE — Consult Note (Addendum)
Chief Complaint: Liver lesions. Request is for liver biopsy  Referring Physician(s): Dr. Bryna Colander  Supervising Physician: Mir, Sharen Heck  Patient Status: [facility] - In-pt  History of Present Illness: Eddie Carter is a 80 y.o. male inpatient. Former smoker. History of DM, interstitial lung disease COPD, CVA, GERD, IBS, diverticulitis  dementia. Presented to the ED at [facility] with RLQ pain, ,chest pain and generalized weakness.  CT abdomen pelvis from 2.11.22  showed a left lower mass with densities in the liver. CT Abd Pelvis reads Multiple rounded low densities are noted throughout the liver of varying sizes consistent with diffuse hepatic metastases. The largest measures 7.1 x 4.3 cm in the left hepatic lobe. 3. 2.4 cm left periaortic lymph node is noted concerning for metastatic disease. Team is requesting a liver biopsy for further determination of metastatic lung disease.   Patient alert and laying in bed, calm and comfortable. He is sleepy but responsive to verbal stimuli and He is oriented to person and endorses "belly pain". Discussed liver biopsy with patient and his son. All questions were answered and concerns were addressed. Patient and son agree with plan.      Past Medical History:  Diagnosis Date  . Arthritis   . Asthma   . BPH (benign prostatic hyperplasia)   . 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 Complication of anesthesia    after septoplasty and uvulectomy-was in icu- [facility] - 10 yrs. ago  . COPD (chronic obstructive pulmonary disease) ([place])   . Depression   . Diabetes mellitus without complication ([place])   . Diverticulitis   . ED (erectile dysfunction)   . Full dentures   . GERD (gastroesophageal reflux disease)    no meds  . Headache    difficulty with headaches- 1950's , again in 1967, 2008- again problems with migrraines   . History of GI bleed   . History of multiple pulmonary nodules   . History of shingles   . History of urethral stricture   .  HOH (hard of hearing)   . HOH (hard of hearing)   . Hypogonadism in male   . IBS (irritable bowel syndrome)   . Joint pain   . Medial meniscus, posterior horn derangement    left knee  . Melanosis coli   . Obesity   . Pneumonia 2014   [facility]  . Shortness of breath   . Skin cancer   . Sleep apnea    uses a cpap, most nights   . Stroke [facility]) 1995   some speech and thought processes slow  . Traumatic tear of lateral meniscus of right knee    right knee  . Urinary frequency   . Urinary incontinence     Past Surgical History:  Procedure Laterality Date  . APPENDECTOMY  1963  . BACK SURGERY    . CARPAL TUNNEL RELEASE     left  . CATARACT EXTRACTION W/PHACO Left 09/13/2018   Procedure: CATARACT EXTRACTION PHACO AND INTRAOCULAR LENS PLACEMENT (IOC);  Surgeon: Birder Robson, MD;  Location: [facility] ORS;  Service: Ophthalmology;  Laterality: Left;  [place]  00:42 CDE 6.73 Fluid pack lot #[id number]  . CATARACT EXTRACTION W/PHACO Right 11/23/2019   Procedure: CATARACT EXTRACTION PHACO AND INTRAOCULAR LENS PLACEMENT ([place]) RIGHT;  Surgeon: Birder Robson, MD;  Location: [facility] ORS;  Service: Ophthalmology;  Laterality: Right;  [place] 01:10.1 CDE 10.25 Fluid Pack Lot # [id number]  . COLONOSCOPY    . COLONOSCOPY WITH PROPOFOL  N/A 12/07/2016   Procedure: COLONOSCOPY WITH PROPOFOL;  Surgeon: Lollie Sails, MD;  Location: [facility] ENDOSCOPY;  Service: Endoscopy;  Laterality: N/A;  . COLONOSCOPY WITH PROPOFOL N/A 06/26/2019   Procedure: COLONOSCOPY WITH PROPOFOL;  Surgeon: Lollie Sails, MD;  Location: [facility] ENDOSCOPY;  Service: Endoscopy;  Laterality: N/A;  . EYE SURGERY     foreign body removed fr. eye, ? side   . FOOT FOREIGN BODY REMOVAL  1976   left foot -multiple pieces glass  . HEMORROIDECTOMY  1995  . KNEE ARTHROSCOPY Bilateral 01/17/2013   Procedure: ARTHROSCOPY KNEE BILATERAL WITH MEDIAL AND LATERAL MENISECTOMIES;  Surgeon: Lorn Junes, MD;  Location: [place];   Service: Orthopedics;  Laterality: Bilateral;  . KYPHOPLASTY N/A 07/23/2017   Procedure: KYPHOPLASTY- L1;  Surgeon: Hessie Knows, MD;  Location: [facility] ORS;  Service: Orthopedics;  Laterality: N/A;  . LUMBAR LAMINECTOMY N/A 11/05/2014   Procedure: LEFT L3-4 MICRODISCECTOMY;  Surgeon: Jessy Oto, MD;  Location: [place];  Service: Orthopedics;  Laterality: N/A;  . LUMBAR LAMINECTOMY/DECOMPRESSION MICRODISCECTOMY N/A 02/12/2015   Procedure: RE-DO LEFT L3-4 MICRODISCECTOMY;  Surgeon: Jessy Oto, MD;  Location: [place];  Service: Orthopedics;  Laterality: N/A;  . NASAL SEPTUM SURGERY  1995  . TONSILLECTOMY    . UPPER GI ENDOSCOPY    . UVULOPALATOPHARYNGOPLASTY (UPPP)/TONSILLECTOMY/SEPTOPLASTY  1995   same time with septoplasty-ended up ICU almost trached    Allergies: Asa [aspirin], Bee venom, Nsaids, and Tolmetin  Medications: Prior to Admission medications   Medication Sig Start Date End Date Taking? Authorizing Provider  Cherry 119-41 MCG/ACT inhaler INHALE TWO PUFFS TWICE A DAY Patient taking differently: Inhale 2 puffs into the lungs daily. 09/23/20  Yes Masoud, Viann Shove, MD  amoxicillin (AMOXIL) 500 MG capsule Take 1 capsule (500 mg total) by mouth 3 (three) times daily. 12/19/20  Yes Beckie Salts, FNP  clopidogrel (PLAVIX) 75 MG tablet Take 75 mg by mouth daily.    Yes [provider]  Coenzyme Q10 (CO Q-10) 100 MG CAPS Take 100 mg by mouth daily.    Yes [provider]  dutasteride (AVODART) 0.5 MG capsule TAKE 1 CAPSULE BY MOUTH EVERY DAY Patient taking differently: Take 0.5 mg by mouth daily. 12/13/20  Yes McGowan, Larene [place] A, PA-C  fluticasone (FLONASE) 50 MCG/ACT nasal spray TAKE 2 SPRAYS INTO EACH NOSTRIL EVERY DAY Patient taking differently: Place 2 sprays into both nostrils daily. 05/30/20  Yes Masoud, Viann Shove, MD  furosemide (LASIX) 40 MG tablet TAKE ONE TABLET BY MOUTH EVERY DAY Patient taking differently: Take 40 mg by mouth daily. 11/25/20  Yes Masoud, Viann Shove, MD   hydrOXYzine (ATARAX/VISTARIL) 10 MG tablet Take 20 mg by mouth at bedtime.  06/10/13  Yes [provider]  levothyroxine (SYNTHROID) 50 MCG tablet TAKE 1 TABLET EVERY DAY ON EMPTY STOMACHWITH A GLASS OF WATER AT LEAST 30-60 [place] BREAKFAST Patient taking differently: Take 50 mcg by mouth daily before breakfast. 12/13/20  Yes Beckie Salts, FNP  Melatonin 3 MG TABS Take 1 tablet by mouth at bedtime.   Yes [provider]  metroNIDAZOLE (FLAGYL) 500 MG tablet Take 1 tablet (500 mg total) by mouth 3 (three) times daily. 12/19/20  Yes Beckie Salts, FNP  SYMBICORT 160-4.5 MCG/ACT inhaler USE 2 PUFFS INTO LUNGS TWICE DAILY Patient taking differently: Inhale 2 puffs into the lungs 2 (two) times daily. 09/23/20  Yes Masoud, Viann Shove, MD  tamsulosin (FLOMAX) 0.4 MG CAPS capsule TAKE 1 CAPSULE BY MOUTH EVERY DAY Patient  taking differently: Take 0.4 mg by mouth daily. 03/25/20  Yes McGowan, Shannon A, PA-C  TOVIAZ 8 MG TB24 tablet Take 8 mg by mouth daily. 09/21/20  Yes [provider]  vitamin B-12 (CYANOCOBALAMIN) 1000 MCG tablet Take 1,000 mcg by mouth daily.   Yes [provider]  acetaminophen (TYLENOL) 500 MG tablet Take 1,000 mg by mouth every 6 (six) hours as needed for moderate pain.     [provider]  albuterol (VENTOLIN HFA) 108 (90 Base) MCG/ACT inhaler PLACE ONE INHALATION TWICE DAILY Patient taking differently: Inhale 1 puff into the lungs 2 (two) times daily. 09/23/20   [name], MD  ketoconazole (NIZORAL) 2 % shampoo Apply 1 application topically 2 (two) times a week. Apply to scalp twice weekly leave in for 5 to 10 mins. 04/04/20   Ralene Bathe, MD     Family History  Problem Relation Age of Onset  . Cancer Mother   . Heart attack Father   . Heart disease Father   . Heart disease Other   . Arthritis Other   . Prostate cancer Neg Hx   . Kidney disease Neg Hx     Social History   Socioeconomic History  . Marital status: Married     Spouse name: Mardene Celeste  . Number of children: 2  . Years of education: 72  . Highest education level: Not on file  Occupational History    Comment: register of deeds  Tobacco Use  . Smoking status: Former Smoker    Packs/day: 1.50    Years: 40.00    Pack years: 60.00    Types: Cigarettes    Quit date: 01/14/1996    Years since quitting: 24.9  . Smokeless tobacco: Never Used  Vaping Use  . Vaping Use: Never used  Substance and Sexual Activity  . Alcohol use: No    Comment: wine in a month  . Drug use: No  . Sexual activity: Never  Other Topics Concern  . Not on file  Social History Narrative   Lives at home with wife, son   caffeine use - 2 cups coffee daily, sodas 1 a day, occas tea   Social Determinants of Health   Financial Resource Strain: Not on file  Food Insecurity: Not on file  Transportation Needs: Not on file  Physical Activity: Not on file  Stress: Not on file  Social Connections: Not on file     Review of Systems: A 12 point ROS discussed and pertinent positives are indicated in the HPI above.  All other systems are negative.  Review of Systems  Constitutional: Negative for fever.  HENT: Negative for congestion.   Respiratory: Negative for cough and shortness of breath.   Cardiovascular: Negative for chest pain.  Gastrointestinal: Positive for abdominal pain.  Neurological: Negative for headaches.  Psychiatric/Behavioral: Positive for confusion. Negative for behavioral problems.    Vital Signs: BP 109/60 (BP Location: Right Arm)   Pulse 75   Temp 99 F (37.2 C) (Oral)   Resp 16   Ht 6' (1.829 m)   Wt 260 lb (117.9 kg)   SpO2 93%   BMI 35.26 kg/m   Physical Exam Vitals and nursing note reviewed.  Constitutional:      Appearance: He is well-developed and well-nourished.  HENT:     Head: Normocephalic.  Cardiovascular:     Rate and Rhythm: Normal rate and regular rhythm.     Heart sounds: Normal heart sounds.  Pulmonary:  Effort:  Pulmonary effort is normal.     Breath sounds: Normal breath sounds.  Musculoskeletal:        General: Normal range of motion.     Cervical back: Normal range of motion.  Skin:    General: Skin is dry.  Neurological:     Mental Status: He is alert.     Comments: Oriented to person  Psychiatric:        Mood and Affect: Mood and affect normal.     Imaging: CT ANGIO CHEST PE W OR WO CONTRAST  Result Date: 01/04/2021 CLINICAL DATA:  Left lung mass noted on CT scan of the abdomen from yesterday. EXAM: CT ANGIOGRAPHY CHEST WITH CONTRAST TECHNIQUE: Multidetector CT imaging of the chest was performed using the standard protocol during bolus administration of intravenous contrast. Multiplanar CT image reconstructions and MIPs were obtained to evaluate the vascular anatomy. CONTRAST:  1mL OMNIPAQUE IOHEXOL 350 MG/ML SOLN COMPARISON:  CT abdomen/pelvis 01/03/2021. FINDINGS: Cardiovascular: High mild cardiac enlargement but no pericardial effusion. Moderate atherosclerotic calcifications involving the aorta no focal aneurysm or dissection. Scattered coronary artery calcifications. The pulmonary arteries are unremarkable. No findings for pulmonary embolism. Mediastinum/Nodes: Scattered borderline enlarged mediastinal and hilar lymph nodes. Prevascular lymph node on image number 33/4 measures 9.5 mm. Small AP window nodes are less than 6 mm. Left hilar node on image number 49/4 measures 8 mm. Left-sided subcarinal lymph node on image 58/4 measures 10.5 mm. The esophagus is grossly normal.  The thyroid gland is unremarkable. Lungs/Pleura: As demonstrated on the recent abdominal CT scan there is a lobulated appearing mass in the left lower lobe. No air bronchograms to suggest this is an infiltrate. It measures approximately 6.5 x 5.2 cm and is worrisome for neoplasm. 10 mm nodule in the left upper lobe on image number 47/6. 8 mm subpleural nodule in the left lower lobe on image number 58/6. 5.5 mm right lower  lobe pulmonary nodule on image number 50/6. Findings consistent with pulmonary metastatic disease. Underlying emphysematous changes and pulmonary scarring. Upper Abdomen: Innumerable hepatic metastatic lesions are noted. Musculoskeletal: No chest wall mass, supraclavicular or axillary lymphadenopathy the. The bony thorax is intact. No findings suspicious for osseous metastatic disease. Review of the MIP images confirms the above findings. IMPRESSION: 1. 6.5 x 5.2 cm left lower lobe pulmonary mass worrisome for neoplasm. There are borderline enlarged hilar and mediastinal lymph nodes suspicious for metastatic adenopathy. 2. Bilateral pulmonary nodules consistent with pulmonary metastatic disease. 3. Innumerable hepatic metastatic lesions. 4. No findings suspicious for osseous metastatic disease. 5. PET-CT may be helpful for further evaluation and staging purposes. 6. Emphysema and aortic atherosclerosis. Aortic Atherosclerosis (ICD10-I70.0) and Emphysema (ICD10-J43.9). Electronically Signed   By: Marijo Sanes M.D.   On: 01/04/2021 15:33   MR BRAIN W WO CONTRAST  Result Date: 01/04/2021 CLINICAL DATA:  Non-small cell lung cancer.  Staging. EXAM: MRI HEAD WITHOUT AND WITH CONTRAST TECHNIQUE: Multiplanar, multiecho pulse sequences of the brain and surrounding structures were obtained without and with intravenous contrast. CONTRAST:  65mL GADAVIST GADOBUTROL 1 MMOL/ML IV SOLN COMPARISON:  Head CT 07/13/2017 FINDINGS: Brain: Axial and coronal postcontrast imaging are markedly degraded by patient motion. No focal abnormality affects the brainstem or cerebellum. Within the cerebral hemispheres, there is a punctate focus of restricted diffusion adjacent to the posterior body of the left lateral ventricle image 79. There is a punctate focus of restricted diffusion in a left parietal vertex gyrus, image 88. The diffusion findings are suspicious for  tiny metastases, but that cannot be confirmed because of the motion  degradation. Consider repeat axial and coronal imaging when the patient is able to tolerate the procedure better. Otherwise, there are mild chronic small-vessel ischemic changes of the hemispheric white matter. No cortical or large vessel territory infarction. No sign of hemorrhage, hydrocephalus or extra-axial collection. Vascular: Major vessels at the base of the brain show flow. Skull and upper cervical spine: Negative Sinuses/Orbits: Clear/normal Other: None IMPRESSION: 1. Markedly motion degraded exam. Axial and coronal postcontrast imaging was markedly degraded and essentially nondiagnostic. 2. Two punctate foci of restricted diffusion in the left cerebral hemisphere, one adjacent to the posterior body of the left lateral ventricle and the other in a left parietal vertex gyrus. The diffusion findings are suspicious for tiny metastases, but that cannot be confirmed because of the motion degradation. Consider repeat axial and coronal postcontrast imaging when the patient is able to tolerate the procedure. Electronically Signed   By: Nelson Chimes M.D.   On: 01/04/2021 19:26   CT ABDOMEN PELVIS W CONTRAST  Result Date: 01/03/2021 CLINICAL DATA:  Right lower quadrant abdominal pain. EXAM: CT ABDOMEN AND PELVIS WITH CONTRAST TECHNIQUE: Multidetector CT imaging of the abdomen and pelvis was performed using the standard protocol following bolus administration of intravenous contrast. CONTRAST:  168mL OMNIPAQUE IOHEXOL 300 MG/ML  SOLN COMPARISON:  October 29, 2014. FINDINGS: Lower chest: 5.6 x 4.4 cm lobulated mass is noted in the left lower lobe laterally concerning for malignancy. Hepatobiliary: No gallstones or biliary dilatation is noted. Multiple rounded low densities are noted throughout the liver of varying sizes consistent with diffuse hepatic metastases. The largest measures 7.1 x 4.3 cm in the left hepatic lobe. Pancreas: Unremarkable. No pancreatic ductal dilatation or surrounding inflammatory changes.  Spleen: Normal in size without focal abnormality. Adrenals/Urinary Tract: Adrenal glands are unremarkable. Kidneys are normal, without renal calculi, focal lesion, or hydronephrosis. Bladder is unremarkable. Stomach/Bowel: Stomach appears normal. Status post appendectomy. There is no evidence of bowel obstruction or inflammation. Vascular/Lymphatic: Atherosclerosis of abdominal aorta is noted without aneurysm or dissection. 2.4 cm left periaortic lymph node is noted concerning for metastatic disease. Reproductive: Prostate is unremarkable. Other: No abdominal wall hernia or abnormality. No abdominopelvic ascites. Musculoskeletal: No acute or significant osseous findings. 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 IMPRESSION: 1. 5.6 x 4.4 cm lobulated mass is noted in the left lower lobe laterally concerning for malignancy. 2. Multiple rounded low densities are noted throughout the liver of varying sizes consistent with diffuse hepatic metastases. The largest measures 7.1 x 4.3 cm in the left hepatic lobe. 3. 2.4 cm left periaortic lymph node is noted concerning for metastatic disease. 4. Aortic atherosclerosis. Aortic Atherosclerosis (ICD10-I70.0). Electronically Signed   By: Marijo Conception M.D.   On: 01/03/2021 13:51    Labs:  CBC: Recent Labs    12/19/20 1427 01/03/21 1111 01/04/21 0604 01/05/21 0800  WBC 10.1 9.1 8.7 9.4  HGB 14.7 15.2 12.8* 14.2  HCT 43.4 47.0 39.6 44.4  PLT 222 230 197 185    COAGS: Recent Labs    01/03/21 1849 01/06/21 0947  INR 1.2 1.3*  APTT 25  --     BMP: Recent Labs    06/05/20 1210 01/03/21 1111 01/04/21 0604  NA 139 135 137  K 3.9 3.8 4.2  CL 102 96* 99  CO2 27 26  26  GLUCOSE 176* 93 92  BUN 17 10 13   CALCIUM 9.0 9.3 9.0  CREATININE 1.09 0.90 0.89  GFRNONAA 65 >60 >60  GFRAA 75  --   --     LIVER FUNCTION  TESTS: Recent Labs    06/05/20 1210 01/03/21 1111 01/04/21 0604  BILITOT 0.6 3.3* 2.7*  AST 13 261* 229*  ALT 11 102* 83*  ALKPHOS  --  616* 528*  PROT 6.3 7.6 6.0*  ALBUMIN  --  3.4* 2.7*    Assessment and Plan:  80 y.o. male inpatient. Former smoker. History of DM, interstitial lung disease COPD, CVA, GERD, IBS, diverticulitis  dementia. Presented [facility] the ED at [facility] with RLQ pain, ,chest pain and generalized weakness.  CT abdomen pelvis from 2.11.22  showed a left lower mass with densities in the liver. CT Abd Pelvis reads Multiple rounded low densities are noted throughout the liver of varying sizes consistent with diffuse hepatic metastases. The largest measures 7.1 x 4.3 cm in the left hepatic lobe. 3. 2.4 cm left periaortic lymph node is noted concerning for metastatic disease. Team is requesting a liver biopsy for further determination of metastatic lung disease.   AST 229, ALT 83,total protein 6.0, total bilirubin 2.7. INR 1.3 Heparin gtt has been discontinued and Lovenox prophylactic dosage was last given on 2.13.22 @ 21:09. Allergies include NSAIDS. Patient has been NPO since midnight  Risks and benefits of ;liver was discussed with the patient and the patient' son via telephone  including, but not limited to bleeding, infection, damage to adjacent structures or low yield requiring additional tests.  All of the questions were answered and there is agreement to proceed.  Consent signed and in chart.  Thank you for this interesting consult.  I greatly enjoyed meeting ISHAAN VILLAMAR and look forward to participating in their care.  A copy of this report was sent to the requesting provider on this date.  Electronically Signed: Jacqualine Mau, NP 01/06/2021, 11:24 AM   I spent a total of 40 Minutes    in face to face in clinical consultation, greater than 50% of which was counseling/coordinating care for liver biopsy

## 2021-01-06 NOTE — TOC Initial Note (Signed)
Transition of Care [facility]) - Initial/Assessment Note    Patient Details  Name: Eddie Carter MRN: [id number] Date of Birth: 05-01-1941  Transition of Care [facility]) CM/SW Contact:    Shelbie Hutching, RN Phone Number: 01/06/2021, 1:00 PM  Clinical Narrative:                 Patient admitted to the hospital for lung mass, and liver metastasis.  Patient having IR guided liver biopsy today.  Per oncology notes patient is stage 4 and any treatment will be palliative.   RNCM met with patient and patient's son at the bedside this morning.  Patient is quiet and is delayed in answering questions.  With delayed responses it is hard to gage patient's orientation although he does seem to answer most questions appropriately but per the son the answers are not completely accurate.   Patient does live alone, recently divorced his wife of 72 years.  Patient's son has not seen him in 2 months, and reports that his father is hard to help.   Patient and son both agree to SNF placement at discharge.  SNF workup started.      Expected Discharge Plan: Skilled Nursing Facility Barriers to Discharge: Continued Medical Work up   Patient Goals and CMS Choice Patient states their goals for this hospitalization and ongoing recovery are:: Patient agrees to SNF and says that he would want chemo and radiation after biopsy comes back if warrented CMS Medicare.gov Compare Post Acute Care list provided to:: Patient Choice offered to / list presented to : Patient  Expected Discharge Plan and Services Expected Discharge Plan: [place]   Discharge Planning Services: CM Consult Post Acute Care Choice: [place] Living arrangements for the past 2 months: Single Family Home                 DME Arranged: N/A         HH Arranged: NA          Prior Living Arrangements/Services Living arrangements for the past 2 months: Single Family Home Lives with:: Self Patient language and need for  interpreter reviewed:: Yes Do you feel safe going back to the place where you live?: Yes      Need for Family Participation in Patient Care: Yes (Comment) (lung mass) Care giver support system in place?: Yes (comment) (son) Current home services: DME (wheelchair, walker) Criminal Activity/Legal Involvement Pertinent to Current Situation/Hospitalization: No - Comment as needed  Activities of Daily Living Home Assistive Devices/Equipment: Wheelchair ADL Screening (condition at time of admission) Patient's cognitive ability adequate to safely complete daily activities?: Yes Is the patient deaf or have difficulty hearing?: No Does the patient have difficulty seeing, even when wearing glasses/contacts?: No Does the patient have difficulty concentrating, remembering, or making decisions?: No Patient able to express need for assistance with ADLs?: Yes Does the patient have difficulty dressing or bathing?: Yes Independently performs ADLs?: No Communication: Independent Dressing (OT): Needs assistance Is this a change from baseline?: Pre-admission baseline Grooming: Needs assistance Is this a change from baseline?: Pre-admission baseline Feeding: Independent Bathing: Needs assistance Is this a change from baseline?: Pre-admission baseline Toileting: Needs assistance Is this a change from baseline?: Pre-admission baseline In/Out Bed: Needs assistance Is this a change from baseline?: Pre-admission baseline Walks in Home: Needs assistance Is this a change from baseline?: Pre-admission baseline Does the patient have difficulty walking or climbing stairs?: Yes Weakness of Legs: Both Weakness of Arms/Hands: None  Permission  Sought/Granted Permission sought to share information with : Case Manager,Family Chief Financial Officer Permission granted to share information with : Yes, Verbal Permission Granted  Share Information with NAME: [name]  Permission granted to share info w  AGENCY: SNF's  Permission granted to share info w Relationship: son     Emotional Assessment Appearance:: Appears stated age Attitude/Demeanor/Rapport: Inconsistent,Lethargic Affect (typically observed): Flat,Withdrawn Orientation: : Oriented to Self,Oriented to Place,Oriented to  Time,Oriented to Situation Alcohol / Substance Use: Not Applicable Psych Involvement: No (comment)  Admission diagnosis:  Lung mass [R91.8] Weakness [R53.1] Liver mass [R16.0] Patient Active Problem List   Diagnosis Date Noted  . Lung mass 01/03/2021  . Abnormal LFTs 01/03/2021  . Hypothyroidism 01/03/2021  . Elevated lactic acid level 01/03/2021  . Liver metastasis ([place]) 01/03/2021  . Chest pain 01/03/2021  . Generalized abdominal pain 12/19/2020  . Viral upper respiratory tract infection 12/02/2020  . Lymphedema 10/15/2020  . Benign prostatic hyperplasia 10/15/2020  . Recurrent headache 10/15/2020  . Hx of erectile dysfunction 10/15/2020  . Primary osteoarthritis of right knee 09/25/2020  . Tremor, essential 09/06/2020  . Joint pain 06/14/2020  . Morbid obesity ([place]) 06/14/2020  . Swelling of limb 06/14/2020  . Bilateral sacroiliitis ([place]) 05/15/2020  . Edema of both lower legs 05/09/2020  . Spondylosis of lumbar region without myelopathy or radiculopathy 03/26/2020  . Pain due to onychomycosis of toenails of both feet 07/10/2019  . Coagulation defect ([place]) 07/10/2019  . Joint pain and swelling due to Lyme disease 11/04/2017  . Headache disorder 10/11/2017  . Chronic pain of right knee 10/11/2017  . Ataxia 09/01/2017  . Loss of memory 09/01/2017  . Physical deconditioning 09/01/2017  . Trochanteric bursitis of both hips 09/01/2017  . Meralgia paresthetica, left lower limb 09/01/2017  . Class 2 obesity with alveolar hypoventilation and body mass index (BMI) of 35.0 to 35.9 in adult ([place]) 08/10/2017  . Left hip pain 08/09/2017  . Pain in right leg 08/09/2017  . History of headache  08/04/2017  . Dementia without behavioral disturbance ([place]) 08/04/2017  . H/O sleep apnea 08/04/2017  . Closed compression fracture of L1 lumbar vertebra 07/15/2017  . Back pain 07/15/2017  . Aspiration into airway 07/15/2017  . Wheezing 07/15/2017  . Leukocytosis 07/15/2017  . Bilateral calf pain 07/15/2017  . Low back pain 07/13/2017  . Cephalalgia 01/29/2016  . Obesity (BMI 35.0-39.9 without comorbidity) 01/29/2016  . Hypertriglyceridemia 01/29/2016  . H/O respiratory system disease 01/29/2016  . H/O erectile dysfunction 01/29/2016  . Cerebrovascular accident, old 01/29/2016  . 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 Chronic obstructive pulmonary disease ([place]) 01/29/2016  . Benign fibroma of prostate 01/29/2016  . Chronic pain 01/29/2016  . 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 Abnormal MRI, lumbar spine 01/29/2016  . Chronic low back pain (Location of Primary Source of Pain) (Bilateral) (L>R) 01/29/2016  . Chronic lower extremity pain (Location of Secondary source of pain) (Bilateral) (L>R) 01/29/2016  . Long term current use of opiate analgesic 01/29/2016  . Long term prescription opiate use 01/29/2016  . Opiate use 01/29/2016  . Encounter for therapeutic drug level monitoring 01/29/2016  . Encounter for pain management planning 01/29/2016  . Chronic lumbar radicular pain (Location of Secondary source of pain) (Bilateral) (L4 Dermatome) (L>R) 01/29/2016  . Chronic hip pain (Location of Tertiary source of pain) (Bilateral) (L>R) 01/29/2016  . Chronic knee pain (Bilateral) (L>R) 01/29/2016  . Chronic anticoagulation (Plavix) 01/29/2016  . Chronic bilateral low  back pain without sciatica 01/29/2016  . Erectile dysfunction of organic origin 01/22/2016  . BPH with obstruction/lower urinary tract symptoms 07/24/2015  . Hypogonadism in male 07/24/2015  . HNP (herniated nucleus pulposus), lumbar 11/05/2014    Class: Acute  . Herniated nucleus  pulposus, lumbar 11/05/2014  . Pulmonary nodules 10/25/2013  . ILD (interstitial lung disease) ([place]) 08/22/2013  . Traumatic tear of lateral meniscus of right knee   . Medial meniscus, posterior horn derangement   . COPD (chronic obstructive pulmonary disease) ([place])   . Shortness of breath   . Sleep apnea   . Stroke ([place])   . Arthritis   . GERD (gastroesophageal reflux disease)   . History of GI bleed   . HOH (hard of hearing)   . Full dentures   . Complication of anesthesia    PCP:  [name], MD Pharmacy:   [place], [place] [address] [place] [address] Phone: [phone] Fax: [phone]     Social Determinants of Health (SDOH) Interventions    Readmission Risk Interventions No flowsheet data found.

## 2021-01-06 NOTE — Progress Notes (Signed)
PROGRESS NOTE    Eddie Carter  BWG:[id number] DOB: July 05, 1941 DOA: 01/03/2021 PCP: [name], MD   Chief complaint.  Abdominal pain and chest pain. Brief Narrative:  Eddie Carter a 80 y.o.malewith medical history significant ofdiet-controlled diabetes, COPD, asthma, stroke, GERD, hypothyroidism, depression, anxiety, OSA on CPAP, IBS, urethral stricture, diverticulitis, GI bleeding, BPH, interstitial lung disease, dementia, who presents with abdominal pain, chest pain, generalized weakness. 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  Liver biopsy performed on 2/14.   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  Lung mass with liver metastasis. Severe abdominal pain and chest pain secondary to cancer. Brain metastasis. Followed by oncology, status post biopsy. Continue pain control with scheduled OxyContin and as needed oxycodone.  #2.  Interstitial lung disease and COPD Still stable.  3.  Type 2 diabetes. Controlled  4.  Chronic dementia.  5.  Lactic acidosis secondary to cancer.    DVT prophylaxis: Lovenox Code Status: SNF Family Communication:  Disposition Plan:  .   Status is: Inpatient  Remains inpatient appropriate because:Inpatient level of care appropriate due to severity of illness   Dispo: The patient is from: Home              Anticipated d/c is to: SNF              Anticipated d/c date is: 2 days              Patient currently is not medically stable to d/c.   Difficult to place patient  No        I/O last 3 completed shifts: In: 340.4 [I.V.:340.4] Out: -  No intake/output data recorded.     Consultants:   Oncology  Procedures: Liver biopsy.  Antimicrobials:None  Subjective: Patient is still sleepy today from sedation, status post liver biopsy. Abdominal pain and chest pain are better controlled. No fever chills  No dysuria hematuria   Objective: Vitals:   01/05/21 1945 01/06/21 0004 01/06/21 0418 01/06/21 0716  BP: 123/69 122/67 130/69 109/60  Pulse: 96 94 88 75  Resp: 20 16 20 16   Temp: 98.6 F (37 C) 97.9 F (36.6 C) 97.9 F (36.6 C) 99 F (37.2 C)  TempSrc: Oral   Oral  SpO2: 94% 92% 93% 93%  Weight:      Height:       No intake or output data in the 24 hours ending 01/06/21 1507 Filed Weights   01/03/21 1104  Weight: 117.9 kg    Examination:  General exam: Appears calm and comfortable  Respiratory system: Clear to auscultation. Respiratory effort normal. Cardiovascular system: S1 & S2 heard, RRR. No JVD, murmurs, rubs, gallops or clicks. No pedal edema. Gastrointestinal system: Abdomen is nondistended, soft and nontender. No organomegaly or masses felt. Normal bowel sounds heard. Central nervous system: Drowsy and oriented x2. No focal neurological deficits. Extremities: Symmetric 5 x 5 power. Skin: No rashes, lesions or ulcers Psychiatry:  Mood & affect appropriate.     Data Reviewed: I have personally  reviewed following labs and imaging studies  CBC: Recent Labs  Lab 01/03/21 1111 01/04/21 0604 01/05/21 0800  WBC 9.1 8.7 9.4  NEUTROABS 6.7  --   --   HGB 15.2 12.8* 14.2  HCT 47.0 39.6 44.4  MCV 91.1 92.1 93.1  PLT 230 197 662   Basic Metabolic Panel: Recent Labs  Lab 01/03/21 1111 01/04/21 0604  NA 135 137  K 3.8 4.2  CL 96* 99  CO2 26 26  GLUCOSE 93 92  BUN 10 13  CREATININE 0.90 0.89  CALCIUM 9.3 9.0   GFR: Estimated Creatinine Clearance: 90.7 mL/min (by C-G formula based on SCr of 0.89  mg/dL). Liver Function Tests: Recent Labs  Lab 01/03/21 1111 01/04/21 0604  AST 261* 229*  ALT 102* 83*  ALKPHOS 616* 528*  BILITOT 3.3* 2.7*  PROT 7.6 6.0*  ALBUMIN 3.4* 2.7*   Recent Labs  Lab 01/03/21 1111  LIPASE 45   No results for input(s): AMMONIA in the last 168 hours. Coagulation Profile: Recent Labs  Lab 01/03/21 1849 01/06/21 0947  INR 1.2 1.3*   Cardiac Enzymes: No results for input(s): CKTOTAL, CKMB, CKMBINDEX, TROPONINI in the last 168 hours. BNP (last 3 results) No results for input(s): PROBNP in the last 8760 hours. HbA1C: Recent Labs    01/04/21 0604  HGBA1C 5.0   CBG: Recent Labs  Lab 01/04/21 0816 01/04/21 1138 01/04/21 1557 01/04/21 2128  GLUCAP 88 135* 126* 100*   Lipid Profile: Recent Labs    01/04/21 0604  CHOL 171  HDL 19*  LDLCALC 116*  TRIG 178*  CHOLHDL 9.0   Thyroid Function Tests: No results for input(s): TSH, T4TOTAL, FREET4, T3FREE, THYROIDAB in the last 72 hours. Anemia Panel: No results for input(s): VITAMINB12, FOLATE, FERRITIN, TIBC, IRON, RETICCTPCT in the last 72 hours. Sepsis Labs: Recent Labs  Lab 01/03/21 1111 01/03/21 1410  LATICACIDVEN 3.4* 2.5*    Recent Results (from the past 240 hour(s))  Culture, blood (Routine X 2) w Reflex to ID Panel     Status: None (Preliminary result)   Collection Time: 01/03/21 11:11 AM   Specimen: BLOOD  Result Value Ref Range Status   Specimen Description BLOOD LEFT FOREARM  Final   Special Requests   Final    BOTTLES DRAWN AEROBIC AND ANAEROBIC Blood Culture adequate volume   Culture   Final    NO GROWTH 3 DAYS Performed at [facility], [address] [place], [place] [address]    Report Status PENDING  Incomplete  Culture, blood (Routine X 2) w Reflex to ID Panel     Status: None (Preliminary result)   Collection Time: 01/03/21 11:11 AM   Specimen: BLOOD  Result Value Ref Range Status   Specimen Description BLOOD LEFT AC  Final   Special Requests    Final    BOTTLES DRAWN AEROBIC AND ANAEROBIC Blood Culture adequate volume   Culture   Final    NO GROWTH 3 DAYS Performed at [facility], [address] [place], [place] [address]    Report Status PENDING  Incomplete  Resp Panel by RT-PCR (Flu A&B, Covid) Nasopharyngeal Swab     Status: None   Collection Time: 01/03/21  3:55 PM   Specimen: Nasopharyngeal Swab; Nasopharyngeal(NP) swabs in vial transport medium  Result Value Ref Range Status   SARS Coronavirus 2 by RT PCR NEGATIVE NEGATIVE Final    Comment: (NOTE) SARS-CoV-2 target nucleic acids are NOT DETECTED.  The SARS-CoV-2 RNA is  generally detectable in upper respiratory specimens during the acute phase of infection. The lowest concentration of SARS-CoV-2 viral copies this assay can detect is 138 copies/mL. A negative result does not preclude SARS-Cov-2 infection and should not be used as the sole basis for treatment or other patient management decisions. A negative result may occur with  improper specimen collection/handling, submission of specimen other than nasopharyngeal swab, presence of viral mutation(s) within the areas targeted by this assay, and inadequate number of viral copies(<138 copies/mL). A negative result must be combined with clinical observations, patient history, and epidemiological information. The expected result is Negative.  Fact Sheet for Patients:  EntrepreneurPulse.com.au  Fact Sheet for Healthcare Providers:  IncredibleEmployment.be  This test is no t yet approved or cleared by the [place] FDA and  has been authorized for detection and/or diagnosis of SARS-CoV-2 by FDA under an Emergency Use Authorization (EUA). This EUA will remain  in effect (meaning this test can be used) for the duration of the COVID-19 declaration under Section 564(b)(1) of the Act, 21 U.S.C.section 360bbb-3(b)(1), unless the authorization is terminated  or revoked  sooner.       Influenza A by PCR NEGATIVE NEGATIVE Final   Influenza B by PCR NEGATIVE NEGATIVE Final    Comment: (NOTE) The Xpert Xpress SARS-CoV-2/FLU/RSV plus assay is intended as an aid in the diagnosis of influenza from Nasopharyngeal swab specimens and should not be used as a sole basis for treatment. Nasal washings and aspirates are unacceptable for Xpert Xpress SARS-CoV-2/FLU/RSV testing.  Fact Sheet for Patients: EntrepreneurPulse.com.au  Fact Sheet for Healthcare Providers: IncredibleEmployment.be  This test is not yet approved or cleared by the [place] FDA and has been authorized for detection and/or diagnosis of SARS-CoV-2 by FDA under an Emergency Use Authorization (EUA). This EUA will remain in effect (meaning this test can be used) for the duration of the COVID-19 declaration under Section 564(b)(1) of the Act, 21 U.S.C. section 360bbb-3(b)(1), unless the authorization is terminated or revoked.  Performed at [facility], [address] [place], [place] [address]   Urine Culture     Status: None   Collection Time: 01/03/21  4:23 PM   Specimen: Urine, Random  Result Value Ref Range Status   Specimen Description   Final    URINE, RANDOM Performed at [facility], [address], [place], [place] [address]    Special Requests   Final    NONE Performed at [facility], [address] [place], [place] [address]    Culture   Final    NO GROWTH Performed at [facility], [place] [address] [place], [place] [address]    Report Status 01/05/2021 FINAL  Final         Radiology Studies: CT ANGIO CHEST PE W OR WO CONTRAST  Result Date: 01/04/2021 CLINICAL DATA:  Left lung mass noted on CT scan of the abdomen from yesterday. EXAM: CT ANGIOGRAPHY CHEST WITH CONTRAST TECHNIQUE: Multidetector CT imaging of the chest was performed using the standard protocol during bolus administration of  intravenous contrast. Multiplanar CT image reconstructions and MIPs were obtained to evaluate the vascular anatomy. CONTRAST:  51mL OMNIPAQUE IOHEXOL 350 MG/ML SOLN COMPARISON:  CT abdomen/pelvis 01/03/2021. FINDINGS: Cardiovascular: High mild cardiac enlargement but no pericardial effusion. Moderate atherosclerotic calcifications involving the aorta no focal aneurysm or dissection. Scattered coronary artery calcifications. The pulmonary arteries are unremarkable. No findings for pulmonary embolism. Mediastinum/Nodes: Scattered borderline enlarged mediastinal and hilar lymph nodes. Prevascular  lymph node on image number 33/4 measures 9.5 mm. Small AP window nodes are less than 6 mm. Left hilar node on image number 49/4 measures 8 mm. Left-sided subcarinal lymph node on image 58/4 measures 10.5 mm. The esophagus is grossly normal.  The thyroid gland is unremarkable. Lungs/Pleura: As demonstrated on the recent abdominal CT scan there is a lobulated appearing mass in the left lower lobe. No air bronchograms to suggest this is an infiltrate. It measures approximately 6.5 x 5.2 cm and is worrisome for neoplasm. 10 mm nodule in the left upper lobe on image number 47/6. 8 mm subpleural nodule in the left lower lobe on image number 58/6. 5.5 mm right lower lobe pulmonary nodule on image number 50/6. Findings consistent with pulmonary metastatic disease. Underlying emphysematous changes and pulmonary scarring. Upper Abdomen: Innumerable hepatic metastatic lesions are noted. Musculoskeletal: No chest wall mass, supraclavicular or axillary lymphadenopathy the. The bony thorax is intact. No findings suspicious for osseous metastatic disease. Review of the MIP images confirms the above findings. IMPRESSION: 1. 6.5 x 5.2 cm left lower lobe pulmonary mass worrisome for neoplasm. There are borderline enlarged hilar and mediastinal lymph nodes suspicious for metastatic adenopathy. 2. Bilateral pulmonary nodules consistent with  pulmonary metastatic disease. 3. Innumerable hepatic metastatic lesions. 4. No findings suspicious for osseous metastatic disease. 5. PET-CT may be helpful for further evaluation and staging purposes. 6. Emphysema and aortic atherosclerosis. Aortic Atherosclerosis (ICD10-I70.0) and Emphysema (ICD10-J43.9). Electronically Signed   By: Marijo Sanes M.D.   On: 01/04/2021 15:33   MR BRAIN W WO CONTRAST  Result Date: 01/04/2021 CLINICAL DATA:  Non-small cell lung cancer.  Staging. EXAM: MRI HEAD WITHOUT AND WITH CONTRAST TECHNIQUE: Multiplanar, multiecho pulse sequences of the brain and surrounding structures were obtained without and with intravenous contrast. CONTRAST:  58mL GADAVIST GADOBUTROL 1 MMOL/ML IV SOLN COMPARISON:  Head CT 07/13/2017 FINDINGS: Brain: Axial and coronal postcontrast imaging are markedly degraded by patient motion. No focal abnormality affects the brainstem or cerebellum. Within the cerebral hemispheres, there is a punctate focus of restricted diffusion adjacent to the posterior body of the left lateral ventricle image 79. There is a punctate focus of restricted diffusion in a left parietal vertex gyrus, image 88. The diffusion findings are suspicious for tiny metastases, but that cannot be confirmed because of the motion degradation. Consider repeat axial and coronal imaging when the patient is able to tolerate the procedure better. Otherwise, there are mild chronic small-vessel ischemic changes of the hemispheric white matter. No cortical or large vessel territory infarction. No sign of hemorrhage, hydrocephalus or extra-axial collection. Vascular: Major vessels at the base of the brain show flow. Skull and upper cervical spine: Negative Sinuses/Orbits: Clear/normal Other: None IMPRESSION: 1. Markedly motion degraded exam. Axial and coronal postcontrast imaging was markedly degraded and essentially nondiagnostic. 2. Two punctate foci of restricted diffusion in the left cerebral  hemisphere, one adjacent to the posterior body of the left lateral ventricle and the other in a left parietal vertex gyrus. The diffusion findings are suspicious for tiny metastases, but that cannot be confirmed because of the motion degradation. Consider repeat axial and coronal postcontrast imaging when the patient is able to tolerate the procedure. Electronically Signed   By: Nelson Chimes M.D.   On: 01/04/2021 19:26   US BIOPSY (LIVER)  Result Date: 01/06/2021 INDICATION: Lung and multiple liver lesions suspicious for metastatic disease EXAM: Ultrasound-guided biopsy of liver lesions MEDICATIONS: None. ANESTHESIA/SEDATION: None COMPLICATIONS: None immediate. PROCEDURE: Informed written consent  was obtained from the patient after a thorough discussion of the procedural risks, benefits and alternatives. All questions were addressed. Maximal Sterile Barrier Technique was utilized including caps, mask, sterile gowns, sterile gloves, sterile drape, hand hygiene and skin antiseptic. A timeout was performed prior to the initiation of the procedure. Patient position supine on the ultrasound table. Epigastric skin prepped and draped in usual sterile fashion. Following local lidocaine administration, 17 gauge introducer needle was advanced into 1 of the left hepatic lobe lesions, and four 18 gauge cores were obtained utilizing continuous ultrasound guidance. Gelfoam slurry was administered through the introducer needle at the biopsy site. Samples were sent to pathology in formalin. Needle removed and hemostasis achieved with 5 minutes of manual compression. Post procedure ultrasound images showed no evidence of significant hemorrhage. IMPRESSION: Ultrasound-guided biopsy of left liver lesion as above. Electronically Signed   By: Miachel Roux M.D.   On: 01/06/2021 13:46        Scheduled Meds: . cholecalciferol  1,000 Units Oral Daily  . donepezil  10 mg Oral QHS  . dutasteride  0.5 mg Oral Daily  . [START ON  01/07/2021] enoxaparin (LOVENOX) injection  0.5 mg/kg Subcutaneous Q24H  . fesoterodine  8 mg Oral Daily  . fluticasone  1 spray Each Nare Daily  . hydrOXYzine  20 mg Oral QHS  . levothyroxine  50 mcg Oral Q0600  . melatonin  3 mg Oral QHS  . mirabegron ER  25 mg Oral Daily  . mometasone-formoterol  2 puff Inhalation BID  . oxyCODONE  10 mg Oral Q12H  . senna-docusate  2 tablet Oral BID  . tamsulosin  0.4 mg Oral Daily  . tiotropium  18 mcg Inhalation Daily  . topiramate  50 mg Oral BID  . vitamin B-12  1,000 mcg Oral Daily   Continuous Infusions:   LOS: 3 days    Time spent: 26 minutes    Sharen Hones, MD [facility]   To contact the attending provider between 7A-7P or the covering provider during after hours 7P-7A, please log into the web site [email or web address] and access using universal [place] password for that web site. If you do not have the password, please call the hospital operator.  01/06/2021, 3:07 PM

## 2021-01-06 NOTE — Progress Notes (Signed)
I spoke to IR. Plan for liver biopsy today. Floor to confirm timing with IR  Dr. Randa Evens, MD, MPH [place] [facility] at [facility] [phone] 01/06/2021 8:05 AM

## 2021-01-06 NOTE — NC FL2 (Signed)
[place] LEVEL OF CARE SCREENING TOOL     IDENTIFICATION  Patient Name: Eddie Carter Birthdate: [birth year] Sex: male Admission Date (Current Location): 01/03/2021  [place] and [place] Number:  Engineering geologist and Address:  [facility], [address] [place], [place] [address]      Provider Number: [id number]  Attending Physician Name and Address:  Sharen Hones, MD  Relative Name and Phone Number:  Pasqual Farias (son) [phone]    Current Level of Care: Hospital Recommended Level of Care: [place] Prior Approval Number:    Date Approved/Denied:   PASRR Number: [id number]  Discharge Plan: SNF    Current Diagnoses: Patient Active Problem List   Diagnosis Date Noted  . Lung mass 01/03/2021  . Abnormal LFTs 01/03/2021  . Hypothyroidism 01/03/2021  . Elevated lactic acid level 01/03/2021  . Liver metastasis ([place]) 01/03/2021  . Chest pain 01/03/2021  . Generalized abdominal pain 12/19/2020  . Viral upper respiratory tract infection 12/02/2020  . Lymphedema 10/15/2020  . Benign prostatic hyperplasia 10/15/2020  . Recurrent headache 10/15/2020  . Hx of erectile dysfunction 10/15/2020  . Primary osteoarthritis of right knee 09/25/2020  . Tremor, essential 09/06/2020  . Joint pain 06/14/2020  . Morbid obesity ([place]) 06/14/2020  . Swelling of limb 06/14/2020  . Bilateral sacroiliitis ([place]) 05/15/2020  . Edema of both lower legs 05/09/2020  . Spondylosis of lumbar region without myelopathy or radiculopathy 03/26/2020  . Pain due to onychomycosis of toenails of both feet 07/10/2019  . Coagulation defect ([place]) 07/10/2019  . Joint pain and swelling due to Lyme disease 11/04/2017  . Headache disorder 10/11/2017  . Chronic pain of right knee 10/11/2017  . Ataxia 09/01/2017  . Loss of memory 09/01/2017  . Physical deconditioning 09/01/2017  . Trochanteric bursitis of both hips 09/01/2017  . Meralgia  paresthetica, left lower limb 09/01/2017  . Class 2 obesity with alveolar hypoventilation and body mass index (BMI) of 35.0 to 35.9 in adult ([place]) 08/10/2017  . Left hip pain 08/09/2017  . Pain in right leg 08/09/2017  . History of headache 08/04/2017  . Dementia without behavioral disturbance ([place]) 08/04/2017  . H/O sleep apnea 08/04/2017  . Closed compression fracture of L1 lumbar vertebra 07/15/2017  . Back pain 07/15/2017  . Aspiration into airway 07/15/2017  . Wheezing 07/15/2017  . Leukocytosis 07/15/2017  . Bilateral calf pain 07/15/2017  . Low back pain 07/13/2017  . Cephalalgia 01/29/2016  . Obesity (BMI 35.0-39.9 without comorbidity) 01/29/2016  . Hypertriglyceridemia 01/29/2016  . H/O respiratory system disease 01/29/2016  . H/O erectile dysfunction 01/29/2016  . Cerebrovascular accident, old 01/29/2016  . 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 Chronic obstructive pulmonary disease ([place]) 01/29/2016  . Benign fibroma of prostate 01/29/2016  . Chronic pain 01/29/2016  . 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 Abnormal MRI, lumbar spine 01/29/2016  . Chronic low back pain (Location of Primary Source of Pain) (Bilateral) (L>R) 01/29/2016  . Chronic lower extremity pain (Location of Secondary source of pain) (Bilateral) (L>R) 01/29/2016  . Long term current use of opiate analgesic 01/29/2016  . Long term prescription opiate use 01/29/2016  . Opiate use 01/29/2016  . Encounter for therapeutic drug level monitoring 01/29/2016  . Encounter for pain management planning 01/29/2016  . Chronic lumbar radicular pain (Location of Secondary source of pain) (Bilateral) (L4 Dermatome) (L>R) 01/29/2016  . Chronic hip pain (Location of Tertiary source of pain) (Bilateral) (L>R)  01/29/2016  . Chronic knee pain (Bilateral) (L>R) 01/29/2016  . Chronic anticoagulation (Plavix) 01/29/2016  . Chronic bilateral low back pain without sciatica 01/29/2016  .  Erectile dysfunction of organic origin 01/22/2016  . BPH with obstruction/lower urinary tract symptoms 07/24/2015  . Hypogonadism in male 07/24/2015  . HNP (herniated nucleus pulposus), lumbar 11/05/2014  . Herniated nucleus pulposus, lumbar 11/05/2014  . Pulmonary nodules 10/25/2013  . ILD (interstitial lung disease) ([place]) 08/22/2013  . Traumatic tear of lateral meniscus of right knee   . Medial meniscus, posterior horn derangement   . COPD (chronic obstructive pulmonary disease) ([place])   . Shortness of breath   . Sleep apnea   . Stroke ([place])   . Arthritis   . GERD (gastroesophageal reflux disease)   . History of GI bleed   . HOH (hard of hearing)   . Full dentures   . Complication of anesthesia     Orientation RESPIRATION BLADDER Height & Weight     Self,Time,Situation,Place  Normal Continent Weight: 117.9 kg Height:  6' (182.9 cm)  BEHAVIORAL SYMPTOMS/MOOD NEUROLOGICAL BOWEL NUTRITION STATUS      Continent Diet (see discharge summary)  AMBULATORY STATUS COMMUNICATION OF NEEDS Skin   Extensive Assist Verbally Normal                       Personal Care Assistance Level of Assistance  Bathing,Feeding,Dressing Bathing Assistance: Limited assistance Feeding assistance: Limited assistance Dressing Assistance: Limited assistance     Functional Limitations Info             SPECIAL CARE FACTORS FREQUENCY  PT (By licensed PT),OT (By licensed OT)     PT Frequency: 5 times per week OT Frequency: 5 times per week            Contractures Contractures Info: Not present    Additional Factors Info  Code Status,Allergies Code Status Info: DNR Allergies Info: ASA, bee venom, NSAIDS [place]           Current Medications (01/06/2021):  This is the current hospital active medication list Current Facility-Administered Medications  Medication Dose Route Frequency Provider Last Rate Last Admin  . albuterol (VENTOLIN HFA) 108 (90 Base) MCG/ACT inhaler 2 puff  2 puff  Inhalation Q4H PRN Ivor Costa, MD      . cholecalciferol (VITAMIN D) tablet 1,000 Units  1,000 Units Oral Daily Ivor Costa, MD   1,000 Units at 01/05/21 1055  . dextromethorphan-guaiFENesin (MUCINEX DM) 30-600 MG per 12 hr tablet 1 tablet  1 tablet Oral BID PRN Ivor Costa, MD      . donepezil (ARICEPT) tablet 10 mg  10 mg Oral QHS Ivor Costa, MD   10 mg at 01/05/21 2108  . dutasteride (AVODART) capsule 0.5 mg  0.5 mg Oral Daily Ivor Costa, MD   0.5 mg at 01/05/21 1055  . fesoterodine (TOVIAZ) tablet 8 mg  8 mg Oral Daily Ivor Costa, MD   8 mg at 01/05/21 1055  . fluticasone (FLONASE) 50 MCG/ACT nasal spray 1 spray  1 spray Each Nare Daily Ivor Costa, MD   1 spray at 01/05/21 1055  . hydrALAZINE (APRESOLINE) injection 5 mg  5 mg Intravenous Q2H PRN Ivor Costa, MD      . hydrOXYzine (ATARAX/VISTARIL) tablet 10 mg  10 mg Oral TID PRN Ivor Costa, MD      . hydrOXYzine (ATARAX/VISTARIL) tablet 20 mg  20 mg Oral QHS Ivor Costa, MD   20 mg at 01/05/21  2107  . hydrOXYzine (VISTARIL) injection 25 mg  25 mg Intramuscular Q6H PRN Ivor Costa, MD      . levothyroxine (SYNTHROID) tablet 50 mcg  50 mcg Oral Q0600 Ivor Costa, MD   50 mcg at 01/06/21 0601  . melatonin tablet 3 mg  3 mg Oral QHS Ivor Costa, MD   3 mg at 01/05/21 2107  . mirabegron ER (MYRBETRIQ) tablet 25 mg  25 mg Oral Daily Ivor Costa, MD   25 mg at 01/05/21 1055  . mometasone-formoterol (DULERA) 200-5 MCG/ACT inhaler 2 puff  2 puff Inhalation BID Ivor Costa, MD   2 puff at 01/05/21 2109  . oxyCODONE (Oxy IR/ROXICODONE) immediate release tablet 5 mg  5 mg Oral Q6H PRN Ivor Costa, MD   5 mg at 01/04/21 0750  . oxyCODONE (OXYCONTIN) 12 hr tablet 10 mg  10 mg Oral Q12H Sharen Hones, MD   10 mg at 01/06/21 1026  . senna-docusate (Senokot-S) tablet 2 tablet  2 tablet Oral BID Sharen Hones, MD   2 tablet at 01/05/21 2107  . tamsulosin (FLOMAX) capsule 0.4 mg  0.4 mg Oral Daily Ivor Costa, MD   0.4 mg at 01/05/21 1055  . tiotropium (SPIRIVA) inhalation  capsule ([facility] use ONLY) 18 mcg  18 mcg Inhalation Daily Ivor Costa, MD   18 mcg at 01/05/21 1056  . topiramate (TOPAMAX) tablet 50 mg  50 mg Oral BID Ivor Costa, MD   50 mg at 01/05/21 2108  . vitamin B-12 (CYANOCOBALAMIN) tablet 1,000 mcg  1,000 mcg Oral Daily Ivor Costa, MD   1,000 mcg at 01/05/21 1055     Discharge Medications: Please see discharge summary for a list of discharge medications.  Relevant Imaging Results:  Relevant Lab Results:   Additional Information SS# [id number]  Shelbie Hutching, RN

## 2021-01-06 NOTE — Consult Note (Signed)
  Consult received for patient with new diagnosis of cancer reported to have made statements implying suicidal ideation.  Chart reviewed.  Came to see patient this afternoon.  He was unable to engage in conversation.  Eyes were open but he was unable to answer any questions.  Seemed acutely delirious.  Nursing tells me he just came back from his liver biopsy and was probably still sedated from anesthesia.  I will check in and see him tomorrow.

## 2021-01-06 NOTE — Care Management Important Message (Signed)
Important Message  Patient Details  Name: Eddie Carter MRN: [id number] Date of Birth: [birth year]   Medicare Important Message Given:  Yes     Dannette Barbara 01/06/2021, 2:30 PM

## 2021-01-06 NOTE — Progress Notes (Signed)
Hematology/Oncology Consult note [facility]  [phone] [phone]  Patient Care Team: [name], MD as PCP - General (Cardiology) Jessy Oto, MD as Consulting Physician (Orthopedic Surgery) Telford Nab, RN as Oncology Nurse Navigator   Name of the patient: Eddie Carter  [id number]  1941/03/02   Date of visit: 01/06/2021  Interval history- patient appeared drowsy at the time of my visit today. Did not give much history  ECOG PS- 3 Pain scale- 2 Opioid associated constipation- no  Review of systems- Review of Systems  Unable to perform ROS: Other      Allergies  Allergen Reactions  . Asa [Aspirin] Shortness Of Breath  . Bee Venom Anaphylaxis  . Nsaids Other (See Comments)    Makes him bleed.  . Tolmetin Other (See Comments)    Makes him bleed.     Past Medical History:  Diagnosis Date  . Arthritis   . Asthma   . BPH (benign prostatic hyperplasia)   . 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 Complication of anesthesia    after septoplasty and uvulectomy-was in icu- [facility] - 10 yrs. ago  . COPD (chronic obstructive pulmonary disease) ([place])   . Depression   . Diabetes mellitus without complication ([place])   . Diverticulitis   . ED (erectile dysfunction)   . Full dentures   . GERD (gastroesophageal reflux disease)    no meds  . Headache    difficulty with headaches- 1950's , again in 1967, 2008- again problems with migrraines   . History of GI bleed   . History of multiple pulmonary nodules   . History of shingles   . History of urethral stricture   . HOH (hard of hearing)   . HOH (hard of hearing)   . Hypogonadism in male   . IBS (irritable bowel syndrome)   . Joint pain   . Medial meniscus, posterior horn derangement    left knee  . Melanosis coli   . Obesity   . Pneumonia 2014   [facility]  . Shortness of breath   . Skin cancer   . Sleep apnea    uses a cpap, most nights   . Stroke [facility])  1995   some speech and thought processes slow  . Traumatic tear of lateral meniscus of right knee    right knee  . Urinary frequency   . Urinary incontinence      Past Surgical History:  Procedure Laterality Date  . APPENDECTOMY  1963  . BACK SURGERY    . CARPAL TUNNEL RELEASE     left  . CATARACT EXTRACTION W/PHACO Left 09/13/2018   Procedure: CATARACT EXTRACTION PHACO AND INTRAOCULAR LENS PLACEMENT (IOC);  Surgeon: Birder Robson, MD;  Location: [facility] ORS;  Service: Ophthalmology;  Laterality: Left;  [place]  00:42 CDE 6.73 Fluid pack lot #[id number]  . CATARACT EXTRACTION W/PHACO Right 11/23/2019   Procedure: CATARACT EXTRACTION PHACO AND INTRAOCULAR LENS PLACEMENT ([place]) RIGHT;  Surgeon: Birder Robson, MD;  Location: [facility] ORS;  Service: Ophthalmology;  Laterality: Right;  [place] 01:10.1 CDE 10.25 Fluid Pack Lot # [id number]  . COLONOSCOPY    . COLONOSCOPY WITH PROPOFOL N/A 12/07/2016   Procedure: COLONOSCOPY WITH PROPOFOL;  Surgeon: Lollie Sails, MD;  Location: [facility] ENDOSCOPY;  Service: Endoscopy;  Laterality: N/A;  . COLONOSCOPY WITH PROPOFOL N/A 06/26/2019   Procedure: COLONOSCOPY WITH PROPOFOL;  Surgeon: Lollie Sails, MD;  Location: [facility] ENDOSCOPY;  Service:  Endoscopy;  Laterality: N/A;  . EYE SURGERY     foreign body removed fr. eye, ? side   . FOOT FOREIGN BODY REMOVAL  1976   left foot -multiple pieces glass  . HEMORROIDECTOMY  1995  . KNEE ARTHROSCOPY Bilateral 01/17/2013   Procedure: ARTHROSCOPY KNEE BILATERAL WITH MEDIAL AND LATERAL MENISECTOMIES;  Surgeon: Lorn Junes, MD;  Location: [place];  Service: Orthopedics;  Laterality: Bilateral;  . KYPHOPLASTY N/A 07/23/2017   Procedure: KYPHOPLASTY- L1;  Surgeon: Hessie Knows, MD;  Location: [facility] ORS;  Service: Orthopedics;  Laterality: N/A;  . LUMBAR LAMINECTOMY N/A 11/05/2014   Procedure: LEFT L3-4 MICRODISCECTOMY;  Surgeon: Jessy Oto, MD;  Location: [place];  Service: Orthopedics;  Laterality:  N/A;  . LUMBAR LAMINECTOMY/DECOMPRESSION MICRODISCECTOMY N/A 02/12/2015   Procedure: RE-DO LEFT L3-4 MICRODISCECTOMY;  Surgeon: Jessy Oto, MD;  Location: [place];  Service: Orthopedics;  Laterality: N/A;  . NASAL SEPTUM SURGERY  1995  . TONSILLECTOMY    . UPPER GI ENDOSCOPY    . UVULOPALATOPHARYNGOPLASTY (UPPP)/TONSILLECTOMY/SEPTOPLASTY  1995   same time with septoplasty-ended up ICU almost trached    Social History   Socioeconomic History  . Marital status: Married    Spouse name: Mardene Celeste  . Number of children: 2  . Years of education: 28  . Highest education level: Not on file  Occupational History    Comment: register of deeds  Tobacco Use  . Smoking status: Former Smoker    Packs/day: 1.50    Years: 40.00    Pack years: 60.00    Types: Cigarettes    Quit date: 01/14/1996    Years since quitting: 24.9  . Smokeless tobacco: Never Used  Vaping Use  . Vaping Use: Never used  Substance and Sexual Activity  . Alcohol use: No    Comment: wine in a month  . Drug use: No  . Sexual activity: Never  Other Topics Concern  . Not on file  Social History Narrative   Lives at home with wife, son   caffeine use - 2 cups coffee daily, sodas 1 a day, occas tea   Social Determinants of Health   Financial Resource Strain: Not on file  Food Insecurity: Not on file  Transportation Needs: Not on file  Physical Activity: Not on file  Stress: Not on file  Social Connections: Not on file  Intimate Partner Violence: Not on file    Family History  Problem Relation Age of Onset  . Cancer Mother   . Heart attack Father   . Heart disease Father   . Heart disease Other   . Arthritis Other   . Prostate cancer Neg Hx   . Kidney disease Neg Hx      Current Facility-Administered Medications:  .  albuterol (VENTOLIN HFA) 108 (90 Base) MCG/ACT inhaler 2 puff, 2 puff, Inhalation, Q4H PRN, Ivor Costa, MD .  cholecalciferol (VITAMIN D) tablet 1,000 Units, 1,000 Units, Oral, Daily, Ivor Costa, MD, 1,000 Units at 01/05/21 1055 .  dextromethorphan-guaiFENesin (Mobile DM) 30-600 MG per 12 hr tablet 1 tablet, 1 tablet, Oral, BID PRN, Ivor Costa, MD .  donepezil (ARICEPT) tablet 10 mg, 10 mg, Oral, QHS, Ivor Costa, MD, 10 mg at 01/05/21 2108 .  dutasteride (AVODART) capsule 0.5 mg, 0.5 mg, Oral, Daily, Ivor Costa, MD, 0.5 mg at 01/05/21 1055 .  [START ON 01/07/2021] enoxaparin (LOVENOX) injection 60 mg, 0.5 mg/kg, Subcutaneous, Q24H, Dorothe Pea, RPH .  fesoterodine (TOVIAZ) tablet  8 mg, 8 mg, Oral, Daily, Ivor Costa, MD, 8 mg at 01/05/21 1055 .  fluticasone (FLONASE) 50 MCG/ACT nasal spray 1 spray, 1 spray, Each Nare, Daily, Ivor Costa, MD, 1 spray at 01/05/21 1055 .  hydrALAZINE (APRESOLINE) injection 5 mg, 5 mg, Intravenous, Q2H PRN, Ivor Costa, MD .  hydrOXYzine (ATARAX/VISTARIL) tablet 10 mg, 10 mg, Oral, TID PRN, Ivor Costa, MD .  hydrOXYzine (ATARAX/VISTARIL) tablet 20 mg, 20 mg, Oral, QHS, Ivor Costa, MD, 20 mg at 01/05/21 2107 .  hydrOXYzine (VISTARIL) injection 25 mg, 25 mg, Intramuscular, Q6H PRN, Ivor Costa, MD .  levothyroxine (SYNTHROID) tablet 50 mcg, 50 mcg, Oral, Q0600, Ivor Costa, MD, 50 mcg at 01/06/21 0601 .  melatonin tablet 3 mg, 3 mg, Oral, QHS, Ivor Costa, MD, 3 mg at 01/05/21 2107 .  mirabegron ER (MYRBETRIQ) tablet 25 mg, 25 mg, Oral, Daily, Ivor Costa, MD, 25 mg at 01/05/21 1055 .  mometasone-formoterol (DULERA) 200-5 MCG/ACT inhaler 2 puff, 2 puff, Inhalation, BID, Ivor Costa, MD, 2 puff at 01/05/21 2109 .  oxyCODONE (Oxy IR/ROXICODONE) immediate release tablet 5 mg, 5 mg, Oral, Q6H PRN, Ivor Costa, MD, 5 mg at 01/04/21 0750 .  oxyCODONE (OXYCONTIN) 12 hr tablet 10 mg, 10 mg, Oral, Q12H, Sharen Hones, MD, 10 mg at 01/06/21 1026 .  senna-docusate (Senokot-S) tablet 2 tablet, 2 tablet, Oral, BID, Sharen Hones, MD, 2 tablet at 01/05/21 2107 .  tamsulosin (FLOMAX) capsule 0.4 mg, 0.4 mg, Oral, Daily, Ivor Costa, MD, 0.4 mg at 01/05/21 1055 .  tiotropium  (SPIRIVA) inhalation capsule ([facility] use ONLY) 18 mcg, 18 mcg, Inhalation, Daily, Ivor Costa, MD, 18 mcg at 01/05/21 1056 .  topiramate (TOPAMAX) tablet 50 mg, 50 mg, Oral, BID, Ivor Costa, MD, 50 mg at 01/05/21 2108 .  vitamin B-12 (CYANOCOBALAMIN) tablet 1,000 mcg, 1,000 mcg, Oral, Daily, Ivor Costa, MD, 1,000 mcg at 01/05/21 1055  Physical exam:  Vitals:   01/05/21 1945 01/06/21 0004 01/06/21 0418 01/06/21 0716  BP: 123/69 122/67 130/69 109/60  Pulse: 96 94 88 75  Resp: 20 16 20 16   Temp: 98.6 F (37 C) 97.9 F (36.6 C) 97.9 F (36.6 C) 99 F (37.2 C)  TempSrc: Oral   Oral  SpO2: 94% 92% 93% 93%  Weight:      Height:       Physical Exam Constitutional:      General: He is not in acute distress. Eyes:     Extraocular Movements: EOM normal.  Cardiovascular:     Rate and Rhythm: Normal rate and regular rhythm.     Heart sounds: Normal heart sounds.  Pulmonary:     Effort: Pulmonary effort is normal.     Breath sounds: Normal breath sounds.  Abdominal:     General: Bowel sounds are normal.  Skin:    General: Skin is warm and dry.  Neurological:     Mental Status: He is alert.      CMP Latest Ref Rng & Units 01/04/2021  Glucose 70 - 99 mg/dL 92  BUN 8 - 23 mg/dL 13  Creatinine 0.61 - 1.24 mg/dL 0.89  Sodium 135 - 145 mmol/L 137  Potassium 3.5 - 5.1 mmol/L 4.2  Chloride 98 - 111 mmol/L 99  CO2 22 - 32 mmol/L 26  Calcium 8.9 - 10.3 mg/dL 9.0  Total Protein 6.5 - 8.1 g/dL 6.0(L)  Total Bilirubin 0.3 - 1.2 mg/dL 2.7(H)  Alkaline Phos 38 - 126 U/L 528(H)  AST 15 - 41 U/L 229(H)  ALT 0 - 44 U/L 83(H)   CBC Latest Ref Rng & Units 01/05/2021  WBC 4.0 - 10.5 K/uL 9.4  Hemoglobin 13.0 - 17.0 g/dL 14.2  Hematocrit 39.0 - 52.0 % 44.4  Platelets 150 - 400 K/uL 185    @IMAGES @  CT ANGIO CHEST PE W OR WO CONTRAST  Result Date: 01/04/2021 CLINICAL DATA:  Left lung mass noted on CT scan of the abdomen from yesterday. EXAM: CT ANGIOGRAPHY CHEST WITH CONTRAST TECHNIQUE:  Multidetector CT imaging of the chest was performed using the standard protocol during bolus administration of intravenous contrast. Multiplanar CT image reconstructions and MIPs were obtained to evaluate the vascular anatomy. CONTRAST:  69mL OMNIPAQUE IOHEXOL 350 MG/ML SOLN COMPARISON:  CT abdomen/pelvis 01/03/2021. FINDINGS: Cardiovascular: High mild cardiac enlargement but no pericardial effusion. Moderate atherosclerotic calcifications involving the aorta no focal aneurysm or dissection. Scattered coronary artery calcifications. The pulmonary arteries are unremarkable. No findings for pulmonary embolism. Mediastinum/Nodes: Scattered borderline enlarged mediastinal and hilar lymph nodes. Prevascular lymph node on image number 33/4 measures 9.5 mm. Small AP window nodes are less than 6 mm. Left hilar node on image number 49/4 measures 8 mm. Left-sided subcarinal lymph node on image 58/4 measures 10.5 mm. The esophagus is grossly normal.  The thyroid gland is unremarkable. Lungs/Pleura: As demonstrated on the recent abdominal CT scan there is a lobulated appearing mass in the left lower lobe. No air bronchograms to suggest this is an infiltrate. It measures approximately 6.5 x 5.2 cm and is worrisome for neoplasm. 10 mm nodule in the left upper lobe on image number 47/6. 8 mm subpleural nodule in the left lower lobe on image number 58/6. 5.5 mm right lower lobe pulmonary nodule on image number 50/6. Findings consistent with pulmonary metastatic disease. Underlying emphysematous changes and pulmonary scarring. Upper Abdomen: Innumerable hepatic metastatic lesions are noted. Musculoskeletal: No chest wall mass, supraclavicular or axillary lymphadenopathy the. The bony thorax is intact. No findings suspicious for osseous metastatic disease. Review of the MIP images confirms the above findings. IMPRESSION: 1. 6.5 x 5.2 cm left lower lobe pulmonary mass worrisome for neoplasm. There are borderline enlarged hilar and  mediastinal lymph nodes suspicious for metastatic adenopathy. 2. Bilateral pulmonary nodules consistent with pulmonary metastatic disease. 3. Innumerable hepatic metastatic lesions. 4. No findings suspicious for osseous metastatic disease. 5. PET-CT may be helpful for further evaluation and staging purposes. 6. Emphysema and aortic atherosclerosis. Aortic Atherosclerosis (ICD10-I70.0) and Emphysema (ICD10-J43.9). Electronically Signed   By: Marijo Sanes M.D.   On: 01/04/2021 15:33   MR BRAIN W WO CONTRAST  Result Date: 01/04/2021 CLINICAL DATA:  Non-small cell lung cancer.  Staging. EXAM: MRI HEAD WITHOUT AND WITH CONTRAST TECHNIQUE: Multiplanar, multiecho pulse sequences of the brain and surrounding structures were obtained without and with intravenous contrast. CONTRAST:  23mL GADAVIST GADOBUTROL 1 MMOL/ML IV SOLN COMPARISON:  Head CT 07/13/2017 FINDINGS: Brain: Axial and coronal postcontrast imaging are markedly degraded by patient motion. No focal abnormality affects the brainstem or cerebellum. Within the cerebral hemispheres, there is a punctate focus of restricted diffusion adjacent to the posterior body of the left lateral ventricle image 79. There is a punctate focus of restricted diffusion in a left parietal vertex gyrus, image 88. The diffusion findings are suspicious for tiny metastases, but that cannot be confirmed because of the motion degradation. Consider repeat axial and coronal imaging when the patient is able to tolerate the procedure better. Otherwise, there are mild chronic small-vessel ischemic changes of the hemispheric  white matter. No cortical or large vessel territory infarction. No sign of hemorrhage, hydrocephalus or extra-axial collection. Vascular: Major vessels at the base of the brain show flow. Skull and upper cervical spine: Negative Sinuses/Orbits: Clear/normal Other: None IMPRESSION: 1. Markedly motion degraded exam. Axial and coronal postcontrast imaging was markedly  degraded and essentially nondiagnostic. 2. Two punctate foci of restricted diffusion in the left cerebral hemisphere, one adjacent to the posterior body of the left lateral ventricle and the other in a left parietal vertex gyrus. The diffusion findings are suspicious for tiny metastases, but that cannot be confirmed because of the motion degradation. Consider repeat axial and coronal postcontrast imaging when the patient is able to tolerate the procedure. Electronically Signed   By: Nelson Chimes M.D.   On: 01/04/2021 19:26   CT ABDOMEN PELVIS W CONTRAST  Result Date: 01/03/2021 CLINICAL DATA:  Right lower quadrant abdominal pain. EXAM: CT ABDOMEN AND PELVIS WITH CONTRAST TECHNIQUE: Multidetector CT imaging of the abdomen and pelvis was performed using the standard protocol following bolus administration of intravenous contrast. CONTRAST:  184mL OMNIPAQUE IOHEXOL 300 MG/ML  SOLN COMPARISON:  October 29, 2014. FINDINGS: Lower chest: 5.6 x 4.4 cm lobulated mass is noted in the left lower lobe laterally concerning for malignancy. Hepatobiliary: No gallstones or biliary dilatation is noted. Multiple rounded low densities are noted throughout the liver of varying sizes consistent with diffuse hepatic metastases. The largest measures 7.1 x 4.3 cm in the left hepatic lobe. Pancreas: Unremarkable. No pancreatic ductal dilatation or surrounding inflammatory changes. Spleen: Normal in size without focal abnormality. Adrenals/Urinary Tract: Adrenal glands are unremarkable. Kidneys are normal, without renal calculi, focal lesion, or hydronephrosis. Bladder is unremarkable. Stomach/Bowel: Stomach appears normal. Status post appendectomy. There is no evidence of bowel obstruction or inflammation. Vascular/Lymphatic: Atherosclerosis of abdominal aorta is noted without aneurysm or dissection. 2.4 cm left periaortic lymph node is noted concerning for metastatic disease. Reproductive: Prostate is unremarkable. Other: No abdominal  wall hernia or abnormality. No abdominopelvic ascites. Musculoskeletal: No acute or significant osseous findings. 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 IMPRESSION: 1. 5.6 x 4.4 cm lobulated mass is noted in the left lower lobe laterally concerning for malignancy. 2. Multiple rounded low densities are noted throughout the liver of varying sizes consistent with diffuse hepatic metastases. The largest measures 7.1 x 4.3 cm in the left hepatic lobe. 3. 2.4 cm left periaortic lymph node is noted concerning for metastatic disease. 4. Aortic atherosclerosis. Aortic Atherosclerosis (ICD10-I70.0). Electronically Signed   By: Marijo Conception M.D.   On: 01/03/2021 13:51   US BIOPSY (LIVER)  Result Date: 01/06/2021 INDICATION: Lung and multiple liver lesions suspicious for metastatic disease EXAM: Ultrasound-guided biopsy of liver lesions MEDICATIONS: None. ANESTHESIA/SEDATION: None COMPLICATIONS: None immediate. PROCEDURE: Informed written consent was obtained from the patient after a thorough discussion of the procedural risks, benefits and alternatives. All questions were addressed. Maximal Sterile Barrier Technique was utilized including caps, mask, sterile gowns, sterile gloves, sterile drape, hand hygiene and skin antiseptic. A timeout was performed prior to the initiation of the procedure. Patient position supine on the ultrasound table. Epigastric skin prepped and draped in usual sterile fashion. Following local lidocaine administration, 17 gauge introducer needle was advanced into 1 of the left hepatic lobe lesions, and four 18 gauge cores were obtained utilizing continuous ultrasound guidance. Gelfoam slurry was administered through the introducer  needle at the biopsy site. Samples were sent to pathology in formalin. Needle removed and hemostasis achieved with 5 minutes of manual  compression. Post procedure ultrasound images showed no evidence of significant hemorrhage. IMPRESSION: Ultrasound-guided biopsy of left liver lesion as above. Electronically Signed   By: Miachel Roux M.D.   On: 01/06/2021 13:46     Assessment and plan- Patient is a 80 y.o. male with lung mass and liver metastases admitetd for abdominal pain  S/p liver biopsy today. Will discuss pathology as an outpatient. I did try to explain mri brain results but patient drowsy and likely did not understand the results. It is unclear if patient has brain mets due to significant motion artifact. Will repeat mri brain 3 months and decide if he needs WBRT  Neoplasm related pain: if patient remains drowsy tomorrow, consider holding oxycontin and continue prn oxycodone alone  Disposition- likely SNF and discussion of treatment plan as an outpatient   Visit Diagnosis 1. Weakness   2. Liver mass   3. Lung mass   4. Liver metastases ([place])      Dr. Randa Evens, MD, MPH [facility] at [facility] [phone] 01/06/2021 9:10 PM

## 2021-01-06 NOTE — Procedures (Signed)
Interventional Radiology Procedure Note  Procedure: Liver lesion biopsy  Indication: Multiple liver lesions  Findings: Please refer to procedural dictation for full description.  Complications: None  EBL: < 10 mL  Miachel Roux, MD [phone]

## 2021-01-07 DIAGNOSIS — R45851 Suicidal ideations: Secondary | ICD-10-CM

## 2021-01-07 DIAGNOSIS — F322 Major depressive disorder, single episode, severe without psychotic features: Secondary | ICD-10-CM

## 2021-01-07 DIAGNOSIS — F0390 Unspecified dementia without behavioral disturbance: Secondary | ICD-10-CM | POA: Diagnosis not present

## 2021-01-07 DIAGNOSIS — Z515 Encounter for palliative care: Secondary | ICD-10-CM | POA: Diagnosis not present

## 2021-01-07 DIAGNOSIS — C787 Secondary malignant neoplasm of liver and intrahepatic bile duct: Secondary | ICD-10-CM | POA: Diagnosis not present

## 2021-01-07 DIAGNOSIS — R918 Other nonspecific abnormal finding of lung field: Secondary | ICD-10-CM | POA: Diagnosis not present

## 2021-01-07 DIAGNOSIS — R410 Disorientation, unspecified: Secondary | ICD-10-CM

## 2021-01-07 DIAGNOSIS — R627 Adult failure to thrive: Secondary | ICD-10-CM

## 2021-01-07 DIAGNOSIS — R630 Anorexia: Secondary | ICD-10-CM

## 2021-01-07 MED ORDER — MEGESTROL ACETATE 400 MG/10ML PO SUSP
400.0000 mg | Freq: Every day | ORAL | Status: DC
  Administered 2021-01-07 – 2021-01-18 (×12): 400 mg via ORAL
  Filled 2021-01-07 (×12): qty 10

## 2021-01-07 MED ORDER — ADULT MULTIVITAMIN W/MINERALS CH
1.0000 | ORAL_TABLET | Freq: Every day | ORAL | Status: DC
  Administered 2021-01-07 – 2021-01-18 (×11): 1 via ORAL
  Filled 2021-01-07 (×11): qty 1

## 2021-01-07 MED ORDER — ENSURE ENLIVE PO LIQD
237.0000 mL | Freq: Three times a day (TID) | ORAL | Status: DC
  Administered 2021-01-07 – 2021-01-18 (×25): 237 mL via ORAL

## 2021-01-07 NOTE — Consult Note (Signed)
[facility] Face-to-Face Psychiatry Consult   Reason for Consult: Consult for 81 year old man with recent cancer diagnosis.  Following up on his having made a suicidal statement. 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 male patient admitted with "I am terrible".  HPI: Patient seen chart reviewed.  I also spoke for quite a while with the patient's son and medical power of attorney [name] today.  Patient diagnosed with tumor with evidence of metastases to liver and now likely metastases to brain as well.  Patient was observed to make a statement about wanting to shoot himself yesterday morning.  Last night I tried to interview him but he was delirious after his biopsy.  Came by to see him this afternoon.  Patient remains confused and difficult to engage.  He could not stay awake for most of the interview.  Falling asleep or shutting his eyes.  He would then suddenly open his eyes occasionally and seem like he was going to become engageable only to drift away again.  During the moments that he was alert he acknowledged feeling very bad and having a very bad mood.  Stated that of course he had suicidal thoughts and that he would kill himself with a shotgun if he had the chance.  Patient replied  to my questions about whether there was anything whatsoever that could make him feel slightly better or help to give him peace or ease his pain was not able to come up with any answer to that.  Past Psychiatric History: Son reports that recently perhaps over a month or more the patient has been showing some change in personality.  The patient recently separated from and divorced his wife of almost 71 years just because of his own behavior.  It sounds like he has been impulsive with disinhibited behavior and anger outbursts.  Son reports that at baseline the patient with a very difficult personality but that the impulsivity has been worse recently.  No known past psychiatric diagnosis or treatment that the son can tell me.  I asked the patient whether he had ever tried to kill himself and he said that he had years ago but then lost the thread of the conversation and could not give me any more detail  Risk to Self:   Risk to Others:   Prior Inpatient Therapy:   Prior Outpatient Therapy:    Past Medical History:  Past Medical History:  Diagnosis Date  . Arthritis   . Asthma   . BPH (benign prostatic hyperplasia)   . 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 Complication of anesthesia    after septoplasty and uvulectomy-was in icu- [facility] - 10 yrs. ago  . COPD (chronic obstructive pulmonary disease) (  [place])   . Depression   . Diabetes mellitus without complication ([place])   . Diverticulitis   . ED (erectile dysfunction)   . Full dentures   . GERD (gastroesophageal reflux disease)    no meds  . Headache    difficulty with headaches- 1950's , again in 1967, 2008- again problems with migrraines   . History of GI bleed   . History of multiple pulmonary nodules   . History of shingles   . History of urethral stricture   . HOH (hard of hearing)   . HOH (hard of hearing)   . Hypogonadism in male   . IBS (irritable bowel syndrome)   . Joint pain   . Medial meniscus, posterior horn  derangement    left knee  . Melanosis coli   . Obesity   . Pneumonia 2014   [facility]  . Shortness of breath   . Skin cancer   . Sleep apnea    uses a cpap, most nights   . Stroke [facility]) 1995   some speech and thought processes slow  . Traumatic tear of lateral meniscus of right knee    right knee  . Urinary frequency   . Urinary incontinence     Past Surgical History:  Procedure Laterality Date  . APPENDECTOMY  1963  . BACK SURGERY    . CARPAL TUNNEL RELEASE     left  . CATARACT EXTRACTION W/PHACO Left 09/13/2018   Procedure: CATARACT EXTRACTION PHACO AND INTRAOCULAR LENS PLACEMENT (IOC);  Surgeon: Birder Robson, MD;  Location: [facility] ORS;  Service: Ophthalmology;  Laterality: Left;  [place]  00:42 CDE 6.73 Fluid pack lot #[id number]  . CATARACT EXTRACTION W/PHACO Right 11/23/2019   Procedure: CATARACT EXTRACTION PHACO AND INTRAOCULAR LENS PLACEMENT ([place]) RIGHT;  Surgeon: Birder Robson, MD;  Location: [facility] ORS;  Service: Ophthalmology;  Laterality: Right;  [place] 01:10.1 CDE 10.25 Fluid Pack Lot # [id number]  . COLONOSCOPY    . COLONOSCOPY WITH PROPOFOL N/A 12/07/2016   Procedure: COLONOSCOPY WITH PROPOFOL;  Surgeon: Lollie Sails, MD;  Location[facility] ENDOSCOPY;  Service: Endoscopy;  Laterality: N/A;  . COLONOSCOPY WITH PROPOFOL N/A 06/26/2019   Procedure: COLONOSCOPY WITH PROPOFOL;  Surgeon: Lollie Sails, MD;  Location: [facility] ENDOSCOPY;  Service: Endoscopy;  Laterality: N/A;  . EYE SURGERY     foreign body removed fr. eye, ? side   . FOOT FOREIGN BODY REMOVAL  1976   left foot -multiple pieces glass  . HEMORROIDECTOMY  1995  . KNEE ARTHROSCOPY Bilateral 01/17/2013   Procedure: ARTHROSCOPY KNEE BILATERAL WITH MEDIAL AND LATERAL MENISECTOMIES;  Surgeon: Lorn Junes, MD;  Location: [place];  Service: Orthopedics;  Laterality: Bilateral;  . KYPHOPLASTY N/A 07/23/2017   Procedure: KYPHOPLASTY- L1;  Surgeon: Hessie Knows, MD;  Location: [facility] ORS;  Service:  Orthopedics;  Laterality: N/A;  . LUMBAR LAMINECTOMY N/A 11/05/2014   Procedure: LEFT L3-4 MICRODISCECTOMY;  Surgeon: Jessy Oto, MD;  Location: [place];  Service: Orthopedics;  Laterality: N/A;  . LUMBAR LAMINECTOMY/DECOMPRESSION MICRODISCECTOMY N/A 02/12/2015   Procedure: RE-DO LEFT L3-4 MICRODISCECTOMY;  Surgeon: Jessy Oto, MD;  Location: [place];  Service: Orthopedics;  Laterality: N/A;  . NASAL SEPTUM SURGERY  1995  . TONSILLECTOMY    . UPPER GI ENDOSCOPY    . UVULOPALATOPHARYNGOPLASTY (UPPP)/TONSILLECTOMY/SEPTOPLASTY  1995   same time with septoplasty-ended up ICU almost trached   Family History:  Family History  Problem Relation Age of Onset  . Cancer Mother   .  Heart attack Father   . Heart disease Father   . Heart disease Other   . Arthritis Other   . Prostate cancer Neg Hx   . Kidney disease Neg Hx    Family Psychiatric  History: Not reported Social History:  Social History   Substance and Sexual Activity  Alcohol Use No   Comment: wine in a month     Social History   Substance and Sexual Activity  Drug Use No    Social History   Socioeconomic History  . Marital status: Married    Spouse name: Mardene Celeste  . Number of children: 2  . Years of education: 4  . Highest education level: Not on file  Occupational History    Comment: register of deeds  Tobacco Use  . Smoking status: Former Smoker    Packs/day: 1.50    Years: 40.00    Pack years: 60.00    Types: Cigarettes    Quit date: 01/14/1996    Years since quitting: 25.0  . Smokeless tobacco: Never Used  Vaping Use  . Vaping Use: Never used  Substance and Sexual Activity  . Alcohol use: No    Comment: wine in a month  . Drug use: No  . Sexual activity: Never  Other Topics Concern  . Not on file  Social History Narrative   Lives at home with wife, son   caffeine use - 2 cups coffee daily, sodas 1 a day, occas tea   Social Determinants of Health   Financial Resource Strain: Not on file  Food  Insecurity: Not on file  Transportation Needs: Not on file  Physical Activity: Not on file  Stress: Not on file  Social Connections: Not on file   Additional Social History:    Allergies:   Allergies  Allergen Reactions  . Asa [Aspirin] Shortness Of Breath  . Bee Venom Anaphylaxis  . Nsaids Other (See Comments)    Makes him bleed.  . Tolmetin Other (See Comments)    Makes him bleed.    Labs:  Results for orders placed or performed during the hospital encounter of 01/03/21 (from the past 48 hour(s))  Protime-INR     Status: Abnormal   Collection Time: 01/06/21  9:47 AM  Result Value Ref Range   Prothrombin Time 15.3 (H) 11.4 - 15.2 seconds   INR 1.3 (H) 0.8 - 1.2    Comment: (NOTE) INR goal varies based on device and disease states. Performed at [facility], [address] [place], [place] [address]     Current Facility-Administered Medications  Medication Dose Route Frequency Provider Last Rate Last Admin  . albuterol (VENTOLIN HFA) 108 (90 Base) MCG/ACT inhaler 2 puff  2 puff Inhalation Q4H PRN Ivor Costa, MD      . cholecalciferol (VITAMIN D) tablet 1,000 Units  1,000 Units Oral Daily Ivor Costa, MD   1,000 Units at 01/07/21 1124  . dextromethorphan-guaiFENesin (MUCINEX DM) 30-600 MG per 12 hr tablet 1 tablet  1 tablet Oral BID PRN Ivor Costa, MD      . donepezil (ARICEPT) tablet 10 mg  10 mg Oral QHS Ivor Costa, MD   10 mg at 01/06/21 2315  . dutasteride (AVODART) capsule 0.5 mg  0.5 mg Oral Daily Ivor Costa, MD   0.5 mg at 01/07/21 1124  . enoxaparin (LOVENOX) injection 60 mg  0.5 mg/kg Subcutaneous Q24H Dorothe Pea, RPH   60 mg at 01/07/21 1125  . feeding supplement (ENSURE ENLIVE /  ENSURE PLUS) liquid 237 mL  237 mL Oral TID BM Sharen Hones, MD      . fesoterodine (TOVIAZ) tablet 8 mg  8 mg Oral Daily Ivor Costa, MD   8 mg at 01/07/21 1125  . fluticasone (FLONASE) 50 MCG/ACT nasal spray 1 spray  1 spray Each Nare Daily Ivor Costa, MD   1 spray at  01/05/21 1055  . hydrALAZINE (APRESOLINE) injection 5 mg  5 mg Intravenous Q2H PRN Ivor Costa, MD      . hydrOXYzine (ATARAX/VISTARIL) tablet 10 mg  10 mg Oral TID PRN Ivor Costa, MD      . hydrOXYzine (ATARAX/VISTARIL) tablet 20 mg  20 mg Oral QHS Ivor Costa, MD   10 mg at 01/06/21 2316  . hydrOXYzine (VISTARIL) injection 25 mg  25 mg Intramuscular Q6H PRN Ivor Costa, MD      . levothyroxine (SYNTHROID) tablet 50 mcg  50 mcg Oral Q0600 Ivor Costa, MD   50 mcg at 01/07/21 0631  . megestrol (MEGACE) 400 MG/10ML suspension 400 mg  400 mg Oral Daily Sharen Hones, MD   400 mg at 01/07/21 1125  . melatonin tablet 3 mg  3 mg Oral QHS Ivor Costa, MD   3 mg at 01/06/21 2316  . mirabegron ER (MYRBETRIQ) tablet 25 mg  25 mg Oral Daily Ivor Costa, MD   25 mg at 01/07/21 1126  . mometasone-formoterol (DULERA) 200-5 MCG/ACT inhaler 2 puff  2 puff Inhalation BID Ivor Costa, MD   2 puff at 01/06/21 2319  . multivitamin with minerals tablet 1 tablet  1 tablet Oral Daily Sharen Hones, MD   1 tablet at 01/07/21 1124  . oxyCODONE (Oxy IR/ROXICODONE) immediate release tablet 5 mg  5 mg Oral Q6H PRN Ivor Costa, MD   5 mg at 01/07/21 1124  . senna-docusate (Senokot-S) tablet 2 tablet  2 tablet Oral BID Sharen Hones, MD   2 tablet at 01/07/21 1124  . tamsulosin (FLOMAX) capsule 0.4 mg  0.4 mg Oral Daily Ivor Costa, MD   0.4 mg at 01/07/21 1124  . tiotropium (SPIRIVA) inhalation capsule ([facility] use ONLY) 18 mcg  18 mcg Inhalation Daily Ivor Costa, MD   18 mcg at 01/05/21 1056  . topiramate (TOPAMAX) tablet 50 mg  50 mg Oral BID Ivor Costa, MD   50 mg at 01/07/21 1126  . vitamin B-12 (CYANOCOBALAMIN) tablet 1,000 mcg  1,000 mcg Oral Daily Ivor Costa, MD   1,000 mcg at 01/07/21 1124    Musculoskeletal: Strength & Muscle Tone: decreased Gait & Station: unable to stand Patient leans: N/A  Psychiatric Specialty Exam: Physical Exam Vitals and nursing note reviewed.  Constitutional:      Appearance: He is well-developed  and well-nourished.  HENT:     Head: Normocephalic and atraumatic.  Eyes:     Conjunctiva/sclera: Conjunctivae normal.     Pupils: Pupils are equal, round, and reactive to light.  Cardiovascular:     Heart sounds: Normal heart sounds.  Pulmonary:     Effort: Pulmonary effort is normal.  Abdominal:     Palpations: Abdomen is soft.  Musculoskeletal:        General: Normal range of motion.     Cervical back: Normal range of motion.  Skin:    General: Skin is warm and dry.  Neurological:     General: No focal deficit present.     Mental Status: He is alert.  Psychiatric:        Attention and  Perception: He is inattentive.        Mood and Affect: Affect is blunt.        Speech: Speech is delayed.        Behavior: Behavior is slowed.        Thought Content: Thought content includes suicidal ideation. Thought content includes suicidal plan.        Cognition and Memory: Cognition is impaired. Memory is impaired.        Judgment: Judgment is inappropriate.     Review of Systems  Constitutional: Negative.   HENT: Negative.   Eyes: Negative.   Respiratory: Negative.   Cardiovascular: Negative.   Gastrointestinal: Negative.   Musculoskeletal: Negative.   Skin: Negative.   Neurological: Negative.   Psychiatric/Behavioral: Positive for confusion, dysphoric mood and suicidal ideas.    Blood pressure (!) 122/59, pulse 89, temperature 97.7 F (36.5 C), temperature source Oral, resp. rate 14, height 6' (1.829 m), weight 117.9 kg, SpO2 92 %.Body mass index is 35.26 kg/m.  General Appearance: Casual  Eye Contact:  Minimal  Speech:  Garbled  Volume:  Decreased  Mood:  Depressed and Dysphoric  Affect:  Congruent  Thought Process:  Disorganized  Orientation:  Negative  Thought Content:  Rumination and Tangential  Suicidal Thoughts:  Yes.  with intent/plan  Homicidal Thoughts:  No  Memory:  Immediate;   Fair Recent;   Poor Remote;   Poor  Judgement:  Impaired  Insight:  Lacking   Psychomotor Activity:  Decreased  Concentration:  Concentration: Poor  Recall:  Poor  Fund of Knowledge:  Poor  Language:  Poor  Akathisia:  No  Handed:  Right  AIMS (if indicated):     Assets:  Social Support  ADL's:  Impaired  Cognition:  Impaired,  Moderate and Severe  Sleep:        Treatment Plan Summary: Plan 80 year old man given a new diagnosis with very bad prognosis.  Not only likely liver metastases but MRI shows probable brain metastases.  Currently the patient is delirious.  I asked nursing if he had been like this all day and they reported that he had.  He had received a small amount of oxycodone a few hours ago but nursing reports that he was confused even before that.  Current delirium most likely related to his medical condition.  Recent changes prior to presentation could be related to brain metastases or paraneoplastic changes.  At this point the patient would be a risk of suicide if he was able to access a gun and had the physical ability to use it.  He does not appear to be making any attempts to try and harm himself in the hospital.  I doubt that a sitter would be of any benefit at this point.  Very unclear whether trying antidepressant medicine could be of any benefit or would just complicate side effects.  I did notice that he is on Topamax 50 mg twice a day and had been prescribed at outside the hospital.  Is not really clear to me what that was for possibly a tremor.  That medicine is sometimes sedating and might be worth reconsidering if he continues to be very sedated and delirious.  Also it tends to inhibit appetite and lead to weight loss which is probably what not what is needed in this gentleman.  Very sad case.  I will try to continue following up to the extent possible.  Disposition: Supportive therapy provided about ongoing stressors.  Aleksei Goodlin,  MD 01/07/2021 7:15 PM

## 2021-01-07 NOTE — Progress Notes (Signed)
PROGRESS NOTE    Eddie Carter  YHC:[id number] DOB: 1940-12-07 DOA: 01/03/2021 PCP: [name], MD   Chief complaint.  Chest pain and abdominal pain. Brief Narrative:  Eddie Carter a 80 y.o.malewith medical history significant ofdiet-controlled diabetes, COPD, asthma, stroke, GERD, hypothyroidism, depression, anxiety, OSA on CPAP, IBS, urethral stricture, diverticulitis, GI bleeding, BPH, interstitial lung disease, dementia, who presents with abdominal pain, chest pain, generalized weakness. 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  Liver biopsy performed on 2/14.   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 Abdominal pain and chest pain secondary to metastatic cancer. Elevated liver function due to liver metastasis. Patient is status post liver biopsy on 2/14. Pain is better controlled. Patient is followed by oncology, biopsy results not available. Patient was initially scheduled placed on scheduled OxyContin, due to sleepiness, I will discontinue it.  Continue as needed Percocet.  #2.  Failure to thrive. Anorexia. Patient has very poor appetite, he also has significant weakness before admission.  He is a seen by PT and OT.  He is pending for nursing home placement. I will start megestrol for poor appetite.  3.  Severe depression with suicidal thoughts. Appear very depressed, he will be evaluated by psychiatry.  #4.  Interstitial lung  disease. COPD. Condition stable, no hypoxemia  5.  Type 2 diabetes per Controlled pain  6.  Dementia. Follow-up  7.  Lactic acidosis secondary to cancer. Improved after the initial fluids.     DVT prophylaxis: Lovenox Code Status: DNR Family Communication:  Disposition Plan:  .   Status is: Inpatient  Remains inpatient appropriate because:Unsafe d/c plan   Dispo: The patient is from: Home              Anticipated d/c is to: SNF              Anticipated d/c date is: 2 days              Patient currently is not medically stable to d/c.   Difficult to place patient No        No intake/output data recorded. No intake/output data recorded.     Consultants:   Oncology  Procedures: Liver biopsy  Antimicrobials: None  Subjective: Patient is very depressed, states that he wanted to shoot himself with his handgun.  He has been sleepy, with very poor appetite.  He has severe weakness. Denies any headache or dizziness. No short of breath or cough. Abdominal pain better controlled, no nausea vomiting.  Not feeling constipated yet.  Objective: Vitals:   01/06/21 2044 01/07/21 0028 01/07/21 0453 01/07/21 0745  BP: 140/68 117/60 120/61 134/77  Pulse: 91 92 89 86  Resp: 20 18 18 16   Temp: 98.2 F (36.8 C) 99 F (37.2 C) (!) 97.5 F (36.4 C) 97.9 F (36.6 C)  TempSrc: Oral Oral Oral Oral  SpO2: 93% 91% 91% 92%  Weight:      Height:  No intake or output data in the 24 hours ending 01/07/21 1003 Filed Weights   01/03/21 1104  Weight: 117.9 kg    Examination:  General exam: Appears calm and comfortable  Respiratory system: Clear to auscultation. Respiratory effort normal. Cardiovascular system: S1 & S2 heard, RRR. No JVD, murmurs, rubs, gallops or clicks. No pedal edema. Gastrointestinal system: Abdomen is nondistended, soft and nontender. No organomegaly or masses felt. Normal bowel sounds heard. Central nervous system: Drowsy and oriented x2. No  focal neurological deficits. Extremities: Symmetric 5 x 5 power. Skin: No rashes, lesions or ulcers Psychiatry: depressed mood.    Data Reviewed: I have personally reviewed following labs and imaging studies  CBC: Recent Labs  Lab 01/03/21 1111 01/04/21 0604 01/05/21 0800  WBC 9.1 8.7 9.4  NEUTROABS 6.7  --   --   HGB 15.2 12.8* 14.2  HCT 47.0 39.6 44.4  MCV 91.1 92.1 93.1  PLT 230 197 761   Basic Metabolic Panel: Recent Labs  Lab 01/03/21 1111 01/04/21 0604  NA 135 137  K 3.8 4.2  CL 96* 99  CO2 26 26  GLUCOSE 93 92  BUN 10 13  CREATININE 0.90 0.89  CALCIUM 9.3 9.0   GFR: Estimated Creatinine Clearance: 90.7 mL/min (by C-G formula based on SCr of 0.89 mg/dL). Liver Function Tests: Recent Labs  Lab 01/03/21 1111 01/04/21 0604  AST 261* 229*  ALT 102* 83*  ALKPHOS 616* 528*  BILITOT 3.3* 2.7*  PROT 7.6 6.0*  ALBUMIN 3.4* 2.7*   Recent Labs  Lab 01/03/21 1111  LIPASE 45   No results for input(s): AMMONIA in the last 168 hours. Coagulation Profile: Recent Labs  Lab 01/03/21 1849 01/06/21 0947  INR 1.2 1.3*   Cardiac Enzymes: No results for input(s): CKTOTAL, CKMB, CKMBINDEX, TROPONINI in the last 168 hours. BNP (last 3 results) No results for input(s): PROBNP in the last 8760 hours. HbA1C: No results for input(s): HGBA1C in the last 72 hours. CBG: Recent Labs  Lab 01/04/21 0816 01/04/21 1138 01/04/21 1557 01/04/21 2128  GLUCAP 88 135* 126* 100*   Lipid Profile: No results for input(s): CHOL, HDL, LDLCALC, TRIG, CHOLHDL, LDLDIRECT in the last 72 hours. Thyroid Function Tests: No results for input(s): TSH, T4TOTAL, FREET4, T3FREE, THYROIDAB in the last 72 hours. Anemia Panel: No results for input(s): VITAMINB12, FOLATE, FERRITIN, TIBC, IRON, RETICCTPCT in the last 72 hours. Sepsis Labs: Recent Labs  Lab 01/03/21 1111 01/03/21 1410  LATICACIDVEN 3.4* 2.5*    Recent Results (from the past 240 hour(s))  Culture, blood (Routine X 2)  w Reflex to ID Panel     Status: None (Preliminary result)   Collection Time: 01/03/21 11:11 AM   Specimen: BLOOD  Result Value Ref Range Status   Specimen Description BLOOD LEFT FOREARM  Final   Special Requests   Final    BOTTLES DRAWN AEROBIC AND ANAEROBIC Blood Culture adequate volume   Culture   Final    NO GROWTH 4 DAYS Performed at [facility], [address], [place], [place] [address]    Report Status PENDING  Incomplete  Culture, blood (Routine X 2) w Reflex to ID Panel     Status: None (Preliminary result)   Collection Time: 01/03/21 11:11 AM   Specimen: BLOOD  Result Value Ref Range Status   Specimen Description BLOOD LEFT AC  Final   Special Requests   Final    BOTTLES DRAWN AEROBIC AND ANAEROBIC Blood Culture adequate volume   Culture  Final    NO GROWTH 4 DAYS Performed at [facility], [address] [place], [place] [address]    Report Status PENDING  Incomplete  Resp Panel by RT-PCR (Flu A&B, Covid) Nasopharyngeal Swab     Status: None   Collection Time: 01/03/21  3:55 PM   Specimen: Nasopharyngeal Swab; Nasopharyngeal(NP) swabs in vial transport medium  Result Value Ref Range Status   SARS Coronavirus 2 by RT PCR NEGATIVE NEGATIVE Final    Comment: (NOTE) SARS-CoV-2 target nucleic acids are NOT DETECTED.  The SARS-CoV-2 RNA is generally detectable in upper respiratory specimens during the acute phase of infection. The lowest concentration of SARS-CoV-2 viral copies this assay can detect is 138 copies/mL. A negative result does not preclude SARS-Cov-2 infection and should not be used as the sole basis for treatment or other patient management decisions. A negative result may occur with  improper specimen collection/handling, submission of specimen other than nasopharyngeal swab, presence of viral mutation(s) within the areas targeted by this assay, and inadequate number of viral copies(<138 copies/mL). A negative result must be  combined with clinical observations, patient history, and epidemiological information. The expected result is Negative.  Fact Sheet for Patients:  EntrepreneurPulse.com.au  Fact Sheet for Healthcare Providers:  IncredibleEmployment.be  This test is no t yet approved or cleared by the [place] FDA and  has been authorized for detection and/or diagnosis of SARS-CoV-2 by FDA under an Emergency Use Authorization (EUA). This EUA will remain  in effect (meaning this test can be used) for the duration of the COVID-19 declaration under Section 564(b)(1) of the Act, 21 U.S.C.section 360bbb-3(b)(1), unless the authorization is terminated  or revoked sooner.       Influenza A by PCR NEGATIVE NEGATIVE Final   Influenza B by PCR NEGATIVE NEGATIVE Final    Comment: (NOTE) The Xpert Xpress SARS-CoV-2/FLU/RSV plus assay is intended as an aid in the diagnosis of influenza from Nasopharyngeal swab specimens and should not be used as a sole basis for treatment. Nasal washings and aspirates are unacceptable for Xpert Xpress SARS-CoV-2/FLU/RSV testing.  Fact Sheet for Patients: EntrepreneurPulse.com.au  Fact Sheet for Healthcare Providers: IncredibleEmployment.be  This test is not yet approved or cleared by the [place] FDA and has been authorized for detection and/or diagnosis of SARS-CoV-2 by FDA under an Emergency Use Authorization (EUA). This EUA will remain in effect (meaning this test can be used) for the duration of the COVID-19 declaration under Section 564(b)(1) of the Act, 21 U.S.C. section 360bbb-3(b)(1), unless the authorization is terminated or revoked.  Performed at [facility], [address], [place], [place] [address]   Urine Culture     Status: None   Collection Time: 01/03/21  4:23 PM   Specimen: Urine, Random  Result Value Ref Range Status   Specimen Description   Final     URINE, RANDOM Performed at [facility], [address], [place], [place] [address]    Special Requests   Final    NONE Performed at [facility], [address] [place], [place] [address]    Culture   Final    NO GROWTH Performed at [facility], [place] [address] [place], [place] [address]    Report Status 01/05/2021 FINAL  Final         Radiology Studies: US BIOPSY (LIVER)  Result Date: 01/06/2021 INDICATION: Lung and multiple liver lesions suspicious for metastatic disease EXAM: Ultrasound-guided biopsy of liver lesions MEDICATIONS: None. ANESTHESIA/SEDATION:  None COMPLICATIONS: None immediate. PROCEDURE: Informed written consent was obtained from the patient after a thorough discussion of the procedural risks, benefits and alternatives. All questions were addressed. Maximal Sterile Barrier Technique was utilized including caps, mask, sterile gowns, sterile gloves, sterile drape, hand hygiene and skin antiseptic. A timeout was performed prior to the initiation of the procedure. Patient position supine on the ultrasound table. Epigastric skin prepped and draped in usual sterile fashion. Following local lidocaine administration, 17 gauge introducer needle was advanced into 1 of the left hepatic lobe lesions, and four 18 gauge cores were obtained utilizing continuous ultrasound guidance. Gelfoam slurry was administered through the introducer needle at the biopsy site. Samples were sent to pathology in formalin. Needle removed and hemostasis achieved with 5 minutes of manual compression. Post procedure ultrasound images showed no evidence of significant hemorrhage. IMPRESSION: Ultrasound-guided biopsy of left liver lesion as above. Electronically Signed   By: Miachel Roux M.D.   On: 01/06/2021 13:46        Scheduled Meds: . cholecalciferol  1,000 Units Oral Daily  . donepezil  10 mg Oral QHS  . dutasteride  0.5 mg Oral Daily  . enoxaparin (LOVENOX) injection   0.5 mg/kg Subcutaneous Q24H  . feeding supplement  237 mL Oral TID BM  . fesoterodine  8 mg Oral Daily  . fluticasone  1 spray Each Nare Daily  . hydrOXYzine  20 mg Oral QHS  . levothyroxine  50 mcg Oral Q0600  . megestrol  400 mg Oral Daily  . melatonin  3 mg Oral QHS  . mirabegron ER  25 mg Oral Daily  . mometasone-formoterol  2 puff Inhalation BID  . multivitamin with minerals  1 tablet Oral Daily  . senna-docusate  2 tablet Oral BID  . tamsulosin  0.4 mg Oral Daily  . tiotropium  18 mcg Inhalation Daily  . topiramate  50 mg Oral BID  . vitamin B-12  1,000 mcg Oral Daily   Continuous Infusions:   LOS: 4 days    Time spent: 28 minutes    Sharen Hones, MD [facility]   To contact the attending provider between 7A-7P or the covering provider during after hours 7P-7A, please log into the web site [email or web address] and access using universal [place] password for that web site. If you do not have the password, please call the hospital operator.  01/07/2021, 10:03 AM

## 2021-01-07 NOTE — Progress Notes (Signed)
[place]  [phone] [phone]   Name: Eddie Carter Date: 01/07/2021 MRN: [id number]  DOB: [birth year]  Patient Care Team: [name], MD as PCP - General (Cardiology) Jessy Oto, MD as Consulting Physician (Orthopedic Surgery) Telford Nab, RN as Oncology Nurse Navigator    REASON FOR CONSULTATION: Eddie Carter is a 80 y.o. male with multiple medical problems including COPD, OSA on CPAP, ILD, history of CVA, IBS, urethral stricture DM, and mild dementia.  Patient was admitted to the hospital on 01/03/2021 with persistent abdominal pain.  CT of the abdomen/pelvis revealed a left lower lobe mass and diffuse hepatic metastases.  MRI of the brain revealed two punctate foci in the left cerebral hemisphere concerning for tiny metastases.  Palliative care was consulted to help address goals..    SOCIAL HISTORY:     reports that he quit smoking about 25 years ago. His smoking use included cigarettes. He has a 60.00 pack-year smoking history. He has never used smokeless tobacco. He reports that he does not drink alcohol and does not use drugs.  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  Patient lives at home alone.  He is in process of divorcing his wife. He has two sons.   ADVANCE DIRECTIVES:  So is reportedly his HCPOA  CODE STATUS: DNR  PAST MEDICAL HISTORY: Past Medical History:  Diagnosis Date  . Arthritis   . Asthma   . BPH (benign prostatic hyperplasia)   . 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 Complication of anesthesia    after septoplasty and uvulectomy-was in icu- [facility] - 10 yrs. ago  . COPD (chronic obstructive pulmonary disease) ([place])   . Depression   . Diabetes mellitus without complication ([place])   . Diverticulitis   . ED (erectile dysfunction)   . Full dentures   . GERD  (gastroesophageal reflux disease)    no meds  . Headache    difficulty with headaches- 1950's , again in 1967, 2008- again problems with migrraines   . History of GI bleed   . History of multiple pulmonary nodules   . History of shingles   . History of urethral stricture   . HOH (hard of hearing)   . HOH (hard of hearing)   . Hypogonadism in male   . IBS (irritable bowel syndrome)   . Joint pain   . Medial meniscus, posterior horn derangement    left knee  . Melanosis coli   . Obesity   . Pneumonia 2014   [facility]  . Shortness of breath   . Skin cancer   . Sleep apnea    uses a cpap, most nights   . Stroke [facility]) 1995   some speech and thought processes slow  . Traumatic tear of lateral meniscus of right knee    right knee  . Urinary frequency   . Urinary incontinence     PAST SURGICAL HISTORY:  Past Surgical History:  Procedure Laterality Date  . APPENDECTOMY  1963  . BACK SURGERY    . CARPAL TUNNEL RELEASE     left  . CATARACT EXTRACTION W/PHACO Left 09/13/2018   Procedure: CATARACT EXTRACTION PHACO AND INTRAOCULAR LENS PLACEMENT (IOC);  Surgeon: Birder Robson, MD;  Location: [facility] ORS;  Service: Ophthalmology;  Laterality: Left;  [place]  00:42  CDE 6.73 Fluid pack lot #@ [id number]  . CATARACT EXTRACTION W/PHACO Right 11/23/2019   Procedure: CATARACT EXTRACTION PHACO AND INTRAOCULAR LENS PLACEMENT ([place]) RIGHT;  Surgeon: Birder Robson, MD;  Location: [facility] ORS;  Service: Ophthalmology;  Laterality: Right;  [place] 01:10.1 CDE 10.25 Fluid Pack Lot # [id number]  . COLONOSCOPY    . COLONOSCOPY WITH PROPOFOL N/A 12/07/2016   Procedure: COLONOSCOPY WITH PROPOFOL;  Surgeon: Lollie Sails, MD;  Location: [facility] ENDOSCOPY;  Service: Endoscopy;  Laterality: N/A;  . COLONOSCOPY WITH PROPOFOL N/A 06/26/2019   Procedure: COLONOSCOPY WITH PROPOFOL;  Surgeon: Lollie Sails, MD;  Location: [facility] ENDOSCOPY;  Service: Endoscopy;  Laterality: N/A;  . EYE SURGERY     foreign body removed fr.  eye, ? side   . FOOT FOREIGN BODY REMOVAL  1976   left foot -multiple pieces glass  . HEMORROIDECTOMY  1995  . KNEE ARTHROSCOPY Bilateral 01/17/2013   Procedure: ARTHROSCOPY KNEE BILATERAL WITH MEDIAL AND LATERAL MENISECTOMIES;  Surgeon: Lorn Junes, MD;  Location: [place];  Service: Orthopedics;  Laterality: Bilateral;  . KYPHOPLASTY N/A 07/23/2017   Procedure: KYPHOPLASTY- L1;  Surgeon: Hessie Knows, MD;  Location: [facility] ORS;  Service: Orthopedics;  Laterality: N/A;  . LUMBAR LAMINECTOMY N/A 11/05/2014   Procedure: LEFT L3-4 MICRODISCECTOMY;  Surgeon: Jessy Oto, MD;  Location: [place];  Service: Orthopedics;  Laterality: N/A;  . LUMBAR LAMINECTOMY/DECOMPRESSION MICRODISCECTOMY N/A 02/12/2015   Procedure: RE-DO LEFT L3-4 MICRODISCECTOMY;  Surgeon: Jessy Oto, MD;  Location: [place];  Service: Orthopedics;  Laterality: N/A;  . NASAL SEPTUM SURGERY  1995  . TONSILLECTOMY    . UPPER GI ENDOSCOPY    . UVULOPALATOPHARYNGOPLASTY (UPPP)/TONSILLECTOMY/SEPTOPLASTY  1995   same time with septoplasty-ended up ICU almost trached    HEMATOLOGY/ONCOLOGY HISTORY:  Oncology History   No history exists.    ALLERGIES:  is allergic to asa [aspirin], bee venom, nsaids, and tolmetin.  MEDICATIONS:  Current Facility-Administered Medications  Medication Dose Route Frequency Provider Last Rate Last Admin  . albuterol (VENTOLIN HFA) 108 (90 Base) MCG/ACT inhaler 2 puff  2 puff Inhalation Q4H PRN Ivor Costa, MD      . cholecalciferol (VITAMIN D) tablet 1,000 Units  1,000 Units Oral Daily Ivor Costa, MD   1,000 Units at 01/07/21 1124  . dextromethorphan-guaiFENesin (MUCINEX DM) 30-600 MG per 12 hr tablet 1 tablet  1 tablet Oral BID PRN Ivor Costa, MD      . donepezil (ARICEPT) tablet 10 mg  10 mg Oral QHS Ivor Costa, MD   10 mg at 01/06/21 2315  . dutasteride (AVODART) capsule 0.5 mg  0.5 mg Oral Daily Ivor Costa, MD   0.5 mg at 01/07/21 1124  . enoxaparin (LOVENOX) injection 60 mg  0.5  mg/kg Subcutaneous Q24H Dorothe Pea, RPH   60 mg at 01/07/21 1125  . feeding supplement (ENSURE ENLIVE / ENSURE PLUS) liquid 237 mL  237 mL Oral TID BM Sharen Hones, MD      . fesoterodine (TOVIAZ) tablet 8 mg  8 mg Oral Daily Ivor Costa, MD   8 mg at 01/07/21 1125  . fluticasone (FLONASE) 50 MCG/ACT nasal spray 1 spray  1 spray Each Nare Daily Ivor Costa, MD   1 spray at 01/05/21 1055  . hydrALAZINE (APRESOLINE) injection 5 mg  5 mg Intravenous Q2H PRN Ivor Costa, MD      . hydrOXYzine (ATARAX/VISTARIL) tablet 10 mg  10 mg Oral TID PRN Ivor Costa, MD      .  hydrOXYzine (ATARAX/VISTARIL) tablet 20 mg  20 mg Oral QHS Ivor Costa, MD   10 mg at 01/06/21 2316  . hydrOXYzine (VISTARIL) injection 25 mg  25 mg Intramuscular Q6H PRN Ivor Costa, MD      . levothyroxine (SYNTHROID) tablet 50 mcg  50 mcg Oral Q0600 Ivor Costa, MD   50 mcg at 01/07/21 0631  . megestrol (MEGACE) 400 MG/10ML suspension 400 mg  400 mg Oral Daily Sharen Hones, MD   400 mg at 01/07/21 1125  . melatonin tablet 3 mg  3 mg Oral QHS Ivor Costa, MD   3 mg at 01/06/21 2316  . mirabegron ER (MYRBETRIQ) tablet 25 mg  25 mg Oral Daily Ivor Costa, MD   25 mg at 01/07/21 1126  . mometasone-formoterol (DULERA) 200-5 MCG/ACT inhaler 2 puff  2 puff Inhalation BID Ivor Costa, MD   2 puff at 01/06/21 2319  . multivitamin with minerals tablet 1 tablet  1 tablet Oral Daily Sharen Hones, MD   1 tablet at 01/07/21 1124  . oxyCODONE (Oxy IR/ROXICODONE) immediate release tablet 5 mg  5 mg Oral Q6H PRN Ivor Costa, MD   5 mg at 01/07/21 1124  . senna-docusate (Senokot-S) tablet 2 tablet  2 tablet Oral BID Sharen Hones, MD   2 tablet at 01/07/21 1124  . tamsulosin (FLOMAX) capsule 0.4 mg  0.4 mg Oral Daily Ivor Costa, MD   0.4 mg at 01/07/21 1124  . tiotropium (SPIRIVA) inhalation capsule ([facility] use ONLY) 18 mcg  18 mcg Inhalation Daily Ivor Costa, MD   18 mcg at 01/05/21 1056  . topiramate (TOPAMAX) tablet 50 mg  50 mg Oral BID Ivor Costa, MD   50 mg  at 01/07/21 1126  . vitamin B-12 (CYANOCOBALAMIN) tablet 1,000 mcg  1,000 mcg Oral Daily Ivor Costa, MD   1,000 mcg at 01/07/21 1124    VITAL SIGNS: BP 134/77 (BP Location: Left Arm)   Pulse 86   Temp 97.9 F (36.6 C) (Oral)   Resp 16   Ht 6' (1.829 m)   Wt 260 lb (117.9 kg)   SpO2 92%   BMI 35.26 kg/m  Filed Weights   01/03/21 1104  Weight: 260 lb (117.9 kg)    Estimated body mass index is 35.26 kg/m as calculated from the following:   Height as of this encounter: 6' (1.829 m).   Weight as of this encounter: 260 lb (117.9 kg).  LABS: CBC:    Component Value Date/Time   WBC 9.4 01/05/2021 0800   HGB 14.2 01/05/2021 0800   HGB 17.5 10/28/2014 2234   HCT 44.4 01/05/2021 0800   HCT 44.1 01/16/2016 0843   PLT 185 01/05/2021 0800   PLT 189 10/28/2014 2234   MCV 93.1 01/05/2021 0800   MCV 94 10/28/2014 2234   NEUTROABS 6.7 01/03/2021 1111   NEUTROABS 13.2 (H) 07/28/2013 0643   LYMPHSABS 1.4 01/03/2021 1111   LYMPHSABS 1.6 07/28/2013 0643   MONOABS 0.8 01/03/2021 1111   MONOABS 0.8 07/28/2013 0643   EOSABS 0.1 01/03/2021 1111   EOSABS 0.0 07/28/2013 0643   BASOSABS 0.1 01/03/2021 1111   BASOSABS 0.0 07/28/2013 0643   Comprehensive Metabolic Panel:    Component Value Date/Time   NA 137 01/04/2021 0604   NA 138 10/28/2014 2234   K 4.2 01/04/2021 0604   K 3.9 10/28/2014 2234   CL 99 01/04/2021 0604   CL 100 10/28/2014 2234   CO2 26 01/04/2021 0604   CO2 28 10/28/2014 2234  BUN 13 01/04/2021 0604   BUN 22 (H) 10/28/2014 2234   CREATININE 0.89 01/04/2021 0604   CREATININE 1.09 06/05/2020 1210   GLUCOSE 92 01/04/2021 0604   GLUCOSE 126 (H) 10/28/2014 2234   CALCIUM 9.0 01/04/2021 0604   CALCIUM 8.9 10/28/2014 2234   AST 229 (H) 01/04/2021 0604   AST 37 10/28/2014 2234   ALT 83 (H) 01/04/2021 0604   ALT 69 (H) 10/28/2014 2234   ALKPHOS 528 (H) 01/04/2021 0604   ALKPHOS 69 10/28/2014 2234   BILITOT 2.7 (H) 01/04/2021 0604   BILITOT 0.3 01/16/2016 0843    BILITOT 0.7 10/28/2014 2234   PROT 6.0 (L) 01/04/2021 0604   PROT 6.4 01/16/2016 0843   PROT 7.2 10/28/2014 2234   ALBUMIN 2.7 (L) 01/04/2021 0604   ALBUMIN 3.9 01/16/2016 0843   ALBUMIN 3.8 10/28/2014 2234    RADIOGRAPHIC STUDIES: CT ANGIO CHEST PE W OR WO CONTRAST  Result Date: 01/04/2021 CLINICAL DATA:  Left lung mass noted on CT scan of the abdomen from yesterday. EXAM: CT ANGIOGRAPHY CHEST WITH CONTRAST TECHNIQUE: Multidetector CT imaging of the chest was performed using the standard protocol during bolus administration of intravenous contrast. Multiplanar CT image reconstructions and MIPs were obtained to evaluate the vascular anatomy. CONTRAST:  52mL OMNIPAQUE IOHEXOL 350 MG/ML SOLN COMPARISON:  CT abdomen/pelvis 01/03/2021. FINDINGS: Cardiovascular: High mild cardiac enlargement but no pericardial effusion. Moderate atherosclerotic calcifications involving the aorta no focal aneurysm or dissection. Scattered coronary artery calcifications. The pulmonary arteries are unremarkable. No findings for pulmonary embolism. Mediastinum/Nodes: Scattered borderline enlarged mediastinal and hilar lymph nodes. Prevascular lymph node on image number 33/4 measures 9.5 mm. Small AP window nodes are less than 6 mm. Left hilar node on image number 49/4 measures 8 mm. Left-sided subcarinal lymph node on image 58/4 measures 10.5 mm. The esophagus is grossly normal.  The thyroid gland is unremarkable. Lungs/Pleura: As demonstrated on the recent abdominal CT scan there is a lobulated appearing mass in the left lower lobe. No air bronchograms to suggest this is an infiltrate. It measures approximately 6.5 x 5.2 cm and is worrisome for neoplasm. 10 mm nodule in the left upper lobe on image number 47/6. 8 mm subpleural nodule in the left lower lobe on image number 58/6. 5.5 mm right lower lobe pulmonary nodule on image number 50/6. Findings consistent with pulmonary metastatic disease. Underlying emphysematous changes  and pulmonary scarring. Upper Abdomen: Innumerable hepatic metastatic lesions are noted. Musculoskeletal: No chest wall mass, supraclavicular or axillary lymphadenopathy the. The bony thorax is intact. No findings suspicious for osseous metastatic disease. Review of the MIP images confirms the above findings. IMPRESSION: 1. 6.5 x 5.2 cm left lower lobe pulmonary mass worrisome for neoplasm. There are borderline enlarged hilar and mediastinal lymph nodes suspicious for metastatic adenopathy. 2. Bilateral pulmonary nodules consistent with pulmonary metastatic disease. 3. Innumerable hepatic metastatic lesions. 4. No findings suspicious for osseous metastatic disease. 5. PET-CT may be helpful for further evaluation and staging purposes. 6. Emphysema and aortic atherosclerosis. Aortic Atherosclerosis (ICD10-I70.0) and Emphysema (ICD10-J43.9). Electronically Signed   By: Marijo Sanes M.D.   On: 01/04/2021 15:33   MR BRAIN W WO CONTRAST  Result Date: 01/04/2021 CLINICAL DATA:  Non-small cell lung cancer.  Staging. EXAM: MRI HEAD WITHOUT AND WITH CONTRAST TECHNIQUE: Multiplanar, multiecho pulse sequences of the brain and surrounding structures were obtained without and with intravenous contrast. CONTRAST:  63mL GADAVIST GADOBUTROL 1 MMOL/ML IV SOLN COMPARISON:  Head CT 07/13/2017 FINDINGS: Brain: Axial and  coronal postcontrast imaging are markedly degraded by patient motion. No focal abnormality affects the brainstem or cerebellum. Within the cerebral hemispheres, there is a punctate focus of restricted diffusion adjacent to the posterior body of the left lateral ventricle image 79. There is a punctate focus of restricted diffusion in a left parietal vertex gyrus, image 88. The diffusion findings are suspicious for tiny metastases, but that cannot be confirmed because of the motion degradation. Consider repeat axial and coronal imaging when the patient is able to tolerate the procedure better. Otherwise, there are  mild chronic small-vessel ischemic changes of the hemispheric white matter. No cortical or large vessel territory infarction. No sign of hemorrhage, hydrocephalus or extra-axial collection. Vascular: Major vessels at the base of the brain show flow. Skull and upper cervical spine: Negative Sinuses/Orbits: Clear/normal Other: None IMPRESSION: 1. Markedly motion degraded exam. Axial and coronal postcontrast imaging was markedly degraded and essentially nondiagnostic. 2. Two punctate foci of restricted diffusion in the left cerebral hemisphere, one adjacent to the posterior body of the left lateral ventricle and the other in a left parietal vertex gyrus. The diffusion findings are suspicious for tiny metastases, but that cannot be confirmed because of the motion degradation. Consider repeat axial and coronal postcontrast imaging when the patient is able to tolerate the procedure. Electronically Signed   By: Nelson Chimes M.D.   On: 01/04/2021 19:26   CT ABDOMEN PELVIS W CONTRAST  Result Date: 01/03/2021 CLINICAL DATA:  Right lower quadrant abdominal pain. EXAM: CT ABDOMEN AND PELVIS WITH CONTRAST TECHNIQUE: Multidetector CT imaging of the abdomen and pelvis was performed using the standard protocol following bolus administration of intravenous contrast. CONTRAST:  18mL OMNIPAQUE IOHEXOL 300 MG/ML  SOLN COMPARISON:  October 29, 2014. FINDINGS: Lower chest: 5.6 x 4.4 cm lobulated mass is noted in the left lower lobe laterally concerning for malignancy. Hepatobiliary: No gallstones or biliary dilatation is noted. Multiple rounded low densities are noted throughout the liver of varying sizes consistent with diffuse hepatic metastases. The largest measures 7.1 x 4.3 cm in the left hepatic lobe. Pancreas: Unremarkable. No pancreatic ductal dilatation or surrounding inflammatory changes. Spleen: Normal in size without focal abnormality. Adrenals/Urinary Tract: Adrenal glands are unremarkable. Kidneys are normal, without  renal calculi, focal lesion, or hydronephrosis. Bladder is unremarkable. Stomach/Bowel: Stomach appears normal. Status post appendectomy. There is no evidence of bowel obstruction or inflammation. Vascular/Lymphatic: Atherosclerosis of abdominal aorta is noted without aneurysm or dissection. 2.4 cm left periaortic lymph node is noted concerning for metastatic disease. Reproductive: Prostate is unremarkable. Other: No abdominal wall hernia or abnormality. No abdominopelvic ascites. Musculoskeletal: No acute or significant osseous findings. 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 IMPRESSION: 1. 5.6 x 4.4 cm lobulated mass is noted in the left lower lobe laterally concerning for malignancy. 2. Multiple rounded low densities are noted throughout the liver of varying sizes consistent with diffuse hepatic metastases. The largest measures 7.1 x 4.3 cm in the left hepatic lobe. 3. 2.4 cm left periaortic lymph node is noted concerning for metastatic disease. 4. Aortic atherosclerosis. Aortic Atherosclerosis (ICD10-I70.0). Electronically Signed   By: Marijo Conception M.D.   On: 01/03/2021 13:51   US BIOPSY (LIVER)  Result Date: 01/06/2021 INDICATION: Lung and multiple liver lesions suspicious for metastatic disease EXAM: Ultrasound-guided biopsy of liver lesions MEDICATIONS: None. ANESTHESIA/SEDATION: None COMPLICATIONS: None immediate. PROCEDURE: Informed written consent was  obtained from the patient after a thorough discussion of the procedural risks, benefits and alternatives. All questions were addressed. Maximal Sterile Barrier Technique was utilized including caps, mask, sterile gowns, sterile gloves, sterile drape, hand hygiene and skin antiseptic. A timeout was performed prior to the initiation of the procedure. Patient position supine on the ultrasound table. Epigastric skin prepped  and draped in usual sterile fashion. Following local lidocaine administration, 17 gauge introducer needle was advanced into 1 of the left hepatic lobe lesions, and four 18 gauge cores were obtained utilizing continuous ultrasound guidance. Gelfoam slurry was administered through the introducer needle at the biopsy site. Samples were sent to pathology in formalin. Needle removed and hemostasis achieved with 5 minutes of manual compression. Post procedure ultrasound images showed no evidence of significant hemorrhage. IMPRESSION: Ultrasound-guided biopsy of left liver lesion as above. Electronically Signed   By: Miachel Roux M.D.   On: 01/06/2021 13:46    PERFORMANCE STATUS (ECOG) : 4 - Bedbound  Review of Systems Unable to complete  Physical Exam General: NAD Pulmonary: Unlabored Extremities: no edema, no joint deformities Skin: no rashes Neurological: Weakness, wakes when stimulated but declined to answer my questions  IMPRESSION: Patient seemed a little bit more alert after OxyContin was discontinued.  He invited me in the room when I knocked but then proceeded to close his eyes and tell me that he was not interested in answering any my questions.  He did agree to allow me to call his son.  I spoke with patient's son by phone.  Son says that patient's performance status at baseline was quite poor.  He was mostly wheelchair-bound.  Son recognizes that patient has an advanced malignancy.  Son is interested in patient discharging to SNF and then would like follow-up in the [place] to discuss treatment options once final pathology is known. However, son says that he expects that patient will likely opt to discontinue treatment even if he agrees to pursue it.  Son recognizes that treatment options might be limited in the setting of poor performance status.  We also discussed the future option of hospice.  We will plan to have patient followed by palliative care at [facility].  Son has located  healthcare power of attorney documents and will bring those in to be scanned into patient's chart.  Per son, patient is in process of divorcing his wife after many years of marriage. Social/family support is somewhat limited due to this.   PLAN: -Continue current scope of treatment -SNF with palliative care   Time Total: 30 minutes  Visit consisted of counseling and education dealing with the complex and emotionally intense issues of symptom management and palliative care in the setting of serious and potentially life-threatening illness.Greater than 50%  of this time was spent counseling and coordinating care related to the above assessment and plan.  Signed by: Altha Harm, PhD, NP-C

## 2021-01-08 ENCOUNTER — Inpatient Hospital Stay (HOSPITAL_COMMUNITY)
Admit: 2021-01-08 | Discharge: 2021-01-08 | Disposition: A | Discharge status: Home / Self Care | Payer: Medicare PPO | Attending: Internal Medicine | Admitting: Internal Medicine

## 2021-01-08 DIAGNOSIS — R630 Anorexia: Secondary | ICD-10-CM | POA: Diagnosis not present

## 2021-01-08 DIAGNOSIS — J849 Interstitial pulmonary disease, unspecified: Secondary | ICD-10-CM

## 2021-01-08 DIAGNOSIS — R627 Adult failure to thrive: Secondary | ICD-10-CM

## 2021-01-08 DIAGNOSIS — I5021 Acute systolic (congestive) heart failure: Secondary | ICD-10-CM | POA: Diagnosis not present

## 2021-01-08 DIAGNOSIS — J431 Panlobular emphysema: Secondary | ICD-10-CM | POA: Diagnosis not present

## 2021-01-08 DIAGNOSIS — R45851 Suicidal ideations: Secondary | ICD-10-CM

## 2021-01-08 DIAGNOSIS — R410 Disorientation, unspecified: Secondary | ICD-10-CM | POA: Diagnosis not present

## 2021-01-08 DIAGNOSIS — F0150 Vascular dementia without behavioral disturbance: Secondary | ICD-10-CM

## 2021-01-08 DIAGNOSIS — E1140 Type 2 diabetes mellitus with diabetic neuropathy, unspecified: Secondary | ICD-10-CM

## 2021-01-08 DIAGNOSIS — R945 Abnormal results of liver function studies: Secondary | ICD-10-CM | POA: Diagnosis not present

## 2021-01-08 LAB — CULTURE, BLOOD (ROUTINE X 2)
Culture: NO GROWTH
Culture: NO GROWTH
Special Requests: ADEQUATE
Special Requests: ADEQUATE

## 2021-01-08 LAB — CBC WITH DIFFERENTIAL/PLATELET
Abs Immature Granulocytes: 0.07 10*3/uL (ref 0.00–0.07)
Basophils Absolute: 0.1 10*3/uL (ref 0.0–0.1)
Basophils Relative: 1 %
Eosinophils Absolute: 0.1 10*3/uL (ref 0.0–0.5)
Eosinophils Relative: 1 %
HCT: 39.8 % (ref 39.0–52.0)
Hemoglobin: 12.7 g/dL — ABNORMAL LOW (ref 13.0–17.0)
Immature Granulocytes: 1 %
Lymphocytes Relative: 12 %
Lymphs Abs: 1.1 10*3/uL (ref 0.7–4.0)
MCH: 29.8 pg (ref 26.0–34.0)
MCHC: 31.9 g/dL (ref 30.0–36.0)
MCV: 93.4 fL (ref 80.0–100.0)
Monocytes Absolute: 1.2 10*3/uL — ABNORMAL HIGH (ref 0.1–1.0)
Monocytes Relative: 12 %
Neutro Abs: 7.4 10*3/uL (ref 1.7–7.7)
Neutrophils Relative %: 73 %
Platelets: 166 10*3/uL (ref 150–400)
RBC: 4.26 MIL/uL (ref 4.22–5.81)
RDW: 18.5 % — ABNORMAL HIGH (ref 11.5–15.5)
WBC: 9.9 10*3/uL (ref 4.0–10.5)
nRBC: 0 % (ref 0.0–0.2)

## 2021-01-08 LAB — BASIC METABOLIC PANEL
Anion gap: 13 (ref 5–15)
BUN: 21 mg/dL (ref 8–23)
CO2: 25 mmol/L (ref 22–32)
Calcium: 9.1 mg/dL (ref 8.9–10.3)
Chloride: 101 mmol/L (ref 98–111)
Creatinine, Ser: 1.09 mg/dL (ref 0.61–1.24)
GFR, Estimated: >60 mL/min (ref >60)
Glucose, Bld: 109 mg/dL — ABNORMAL HIGH (ref 70–99)
Potassium: 4 mmol/L (ref 3.5–5.1)
Sodium: 139 mmol/L (ref 135–145)

## 2021-01-08 LAB — GLUCOSE, CAPILLARY
Glucose-Capillary: 135 mg/dL — ABNORMAL HIGH (ref 70–99)
Glucose-Capillary: 146 mg/dL — ABNORMAL HIGH (ref 70–99)
Glucose-Capillary: 158 mg/dL — ABNORMAL HIGH (ref 70–99)
Glucose-Capillary: 166 mg/dL — ABNORMAL HIGH (ref 70–99)
Glucose-Capillary: 98 mg/dL (ref 70–99)

## 2021-01-08 LAB — ECHOCARDIOGRAM COMPLETE
Height: 72 in
S' Lateral: 2.62 cm
Weight: 4160 oz

## 2021-01-08 LAB — LACTIC ACID, PLASMA
Lactic Acid, Venous: 3.7 mmol/L (ref 0.5–1.9)
Lactic Acid, Venous: 3.8 mmol/L (ref 0.5–1.9)

## 2021-01-08 LAB — MAGNESIUM: Magnesium: 2.5 mg/dL — ABNORMAL HIGH (ref 1.7–2.4)

## 2021-01-08 MED ORDER — DEXTROSE-NACL 5-0.9 % IV SOLN: INTRAVENOUS | Status: DC

## 2021-01-08 MED ORDER — INSULIN ASPART 100 UNIT/ML ~~LOC~~ SOLN
0.0000 [IU] | SUBCUTANEOUS | Status: DC
  Administered 2021-01-08: 22:00:00 2 [IU] via SUBCUTANEOUS
  Administered 2021-01-09 – 2021-01-10 (×5): 1 [IU] via SUBCUTANEOUS
  Filled 2021-01-08 (×6): qty 1

## 2021-01-08 MED ORDER — OXYCODONE HCL 5 MG PO TABS
5.0000 mg | ORAL_TABLET | Freq: Two times a day (BID) | ORAL | Status: DC | PRN
  Administered 2021-01-12 – 2021-01-13 (×2): 5 mg via ORAL
  Filled 2021-01-08 (×2): qty 1

## 2021-01-08 NOTE — Consult Note (Signed)
[facility] Consult   Reason for Consult: Follow-up consult 80 year old man with cancer. 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 male patient admitted with patient not able to articulate anything specific.  HPI: Patient seen chart reviewed.  Yesterday when I saw him he was still pretty delirious.  He could rouse himself at times to say that he did wish she was dead and had thoughts about shooting himself but was not able to stay awake long enough to have any more conversation about it and for the most part stay delirious.  Today I came by it was much the same.  Patient was about halfway awake.  When I spoke his name he could rouse himself and say a couple of words but nothing that made a lot of sense.  Still seems inattentive and delirious.  Could not tell me whether there was anything that would improve his quality of life or bring him any relief.  Past Psychiatric History: None see previous  Risk to Self:   Risk to Others:   Prior Inpatient Therapy:   Prior Outpatient Therapy:    Past Medical History:  Past Medical History:  Diagnosis Date  . Arthritis   . Asthma   . BPH (benign prostatic hyperplasia)   . 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  Complication of anesthesia    after septoplasty and uvulectomy-was in icu- [facility] - 10 yrs. ago  . COPD (chronic obstructive pulmonary disease) ([place])   . Depression   . Diabetes mellitus without complication ([employer])   . Diverticulitis   . ED (erectile dysfunction)   . Full dentures   . GERD (gastroesophageal reflux disease)    no meds  . Headache    difficulty with headaches- 1950's , again in 1967, 2008- again problems with migrraines   . History of GI bleed   . History of multiple pulmonary nodules   . History of shingles   . History of urethral stricture   . HOH (hard of hearing)   . HOH (hard of hearing)   . Hypogonadism in male   . IBS (irritable bowel syndrome)   . Joint pain   . Medial meniscus, posterior horn derangement    left knee  . Melanosis coli   . Obesity   . Pneumonia 2014   [facility]  . Shortness of breath   . Skin cancer   . Sleep apnea    uses a cpap, most nights   . Stroke [facility]) 1995   some speech and thought processes slow  . Traumatic tear of lateral meniscus of right knee    right knee  . Urinary frequency   . Urinary incontinence     Past Surgical  History:  Procedure Laterality Date  . APPENDECTOMY  1963  . BACK SURGERY    . CARPAL TUNNEL RELEASE     left  . CATARACT EXTRACTION W/PHACO Left 09/13/2018   Procedure: CATARACT EXTRACTION PHACO AND INTRAOCULAR LENS PLACEMENT (IOC);  Surgeon: Birder Robson, MD;  Location: [facility] ORS;  Service: Ophthalmology;  Laterality: Left;  [place]  00:42 CDE 6.73 Fluid pack lot #@ [id number]  . CATARACT EXTRACTION W/PHACO Right 11/23/2019   Procedure: CATARACT EXTRACTION PHACO AND INTRAOCULAR LENS PLACEMENT ([place]) RIGHT;  Surgeon: Birder Robson, MD;  Location: [facility] ORS;  Service: Ophthalmology;  Laterality: Right;  [place] 01:10.1 CDE 10.25 Fluid Pack Lot # [id number]  . COLONOSCOPY    . COLONOSCOPY WITH PROPOFOL N/A 12/07/2016   Procedure: COLONOSCOPY WITH PROPOFOL;  Surgeon: Lollie Sails, MD;  Location:  [facility] ENDOSCOPY;  Service: Endoscopy;  Laterality: N/A;  . COLONOSCOPY WITH PROPOFOL N/A 06/26/2019   Procedure: COLONOSCOPY WITH PROPOFOL;  Surgeon: Lollie Sails, MD;  Location: [facility] ENDOSCOPY;  Service: Endoscopy;  Laterality: N/A;  . EYE SURGERY     foreign body removed fr. eye, ? side   . FOOT FOREIGN BODY REMOVAL  1976   left foot -multiple pieces glass  . HEMORROIDECTOMY  1995  . KNEE ARTHROSCOPY Bilateral 01/17/2013   Procedure: ARTHROSCOPY KNEE BILATERAL WITH MEDIAL AND LATERAL MENISECTOMIES;  Surgeon: Lorn Junes, MD;  Location: [place];  Service: Orthopedics;  Laterality: Bilateral;  . KYPHOPLASTY N/A 07/23/2017   Procedure: KYPHOPLASTY- L1;  Surgeon: Hessie Knows, MD;  Location: [facility] ORS;  Service: Orthopedics;  Laterality: N/A;  . LUMBAR LAMINECTOMY N/A 11/05/2014   Procedure: LEFT L3-4 MICRODISCECTOMY;  Surgeon: Jessy Oto, MD;  Location: [facility];  Service: Orthopedics;  Laterality: N/A;  . LUMBAR LAMINECTOMY/DECOMPRESSION MICRODISCECTOMY N/A 02/12/2015   Procedure: RE-DO LEFT L3-4 MICRODISCECTOMY;  Surgeon: Jessy Oto, MD;  Location: [place];  Service: Orthopedics;  Laterality: N/A;  . NASAL SEPTUM SURGERY  1995  . TONSILLECTOMY    . UPPER GI ENDOSCOPY    . UVULOPALATOPHARYNGOPLASTY (UPPP)/TONSILLECTOMY/SEPTOPLASTY  1995   same time with septoplasty-ended up ICU almost trached   Family History:  Family History  Problem Relation Age of Onset  . Cancer Mother   . Heart attack Father   . Heart disease Father   . Heart disease Other   . Arthritis Other   . Prostate cancer Neg Hx   . Kidney disease Neg Hx    Family Psychiatric  History: See previous Social History:  Social History   Substance and Sexual Activity  Alcohol Use No   Comment: wine in a month     Social History   Substance and Sexual Activity  Drug Use No    Social History   Socioeconomic History  . Marital status: Married    Spouse name: Mardene Celeste  . Number of children:  2  . Years of education: 16  . Highest education level: Not on file  Occupational History    Comment: register of deeds  Tobacco Use  . Smoking status: Former Smoker    Packs/day: 1.50    Years: 40.00    Pack years: 60.00    Types: Cigarettes    Quit date: 01/14/1996    Years since quitting: 25.0  . Smokeless tobacco: Never Used  Vaping Use  . Vaping Use: Never used  Substance and Sexual Activity  . Alcohol use: No    Comment: wine in a month  . Drug use: No  .  Sexual activity: Never  Other Topics Concern  . Not on file  Social History Narrative   Lives at home with wife, son   caffeine use - 2 cups coffee daily, sodas 1 a day, occas tea   Social Determinants of Health   Financial Resource Strain: Not on file  Food Insecurity: Not on file  Transportation Needs: Not on file  Physical Activity: Not on file  Stress: Not on file  Social Connections: Not on file   Additional Social History:    Allergies:   Allergies  Allergen Reactions  . Asa [Aspirin] Shortness Of Breath  . Bee Venom Anaphylaxis  . Nsaids Other (See Comments)    Makes him bleed.  . Tolmetin Other (See Comments)    Makes him bleed.    Labs:  Results for orders placed or performed during the hospital encounter of 01/03/21 (from the past 48 hour(s))  CBC with Differential/Platelet     Status: Abnormal   Collection Time: 01/08/21  5:00 AM  Result Value Ref Range   WBC 9.9 4.0 - 10.5 K/uL   RBC 4.26 4.22 - 5.81 MIL/uL   Hemoglobin 12.7 (L) 13.0 - 17.0 g/dL   HCT 39.8 39.0 - 52.0 %   MCV 93.4 80.0 - 100.0 fL   MCH 29.8 26.0 - 34.0 pg   MCHC 31.9 30.0 - 36.0 g/dL   RDW 18.5 (H) 11.5 - 15.5 %   Platelets 166 150 - 400 K/uL   nRBC 0.0 0.0 - 0.2 %   Neutrophils Relative % 73 %   Neutro Abs 7.4 1.7 - 7.7 K/uL   Lymphocytes Relative 12 %   Lymphs Abs 1.1 0.7 - 4.0 K/uL   Monocytes Relative 12 %   Monocytes Absolute 1.2 (H) 0.1 - 1.0 K/uL   Eosinophils Relative 1 %   Eosinophils Absolute 0.1 0.0  - 0.5 K/uL   Basophils Relative 1 %   Basophils Absolute 0.1 0.0 - 0.1 K/uL   Immature Granulocytes 1 %   Abs Immature Granulocytes 0.07 0.00 - 0.07 K/uL    Comment: Performed at [facility], [address] [place], [place] [address]  Basic metabolic panel     Status: Abnormal   Collection Time: 01/08/21  5:00 AM  Result Value Ref Range   Sodium 139 135 - 145 mmol/L   Potassium 4.0 3.5 - 5.1 mmol/L   Chloride 101 98 - 111 mmol/L   CO2 25 22 - 32 mmol/L   Glucose, Bld 109 (H) 70 - 99 mg/dL    Comment: Glucose reference range applies only to samples taken after fasting for at least 8 hours.   BUN 21 8 - 23 mg/dL   Creatinine, Ser 1.09 0.61 - 1.24 mg/dL   Calcium 9.1 8.9 - 10.3 mg/dL   GFR, Estimated >60 >60 mL/min    Comment: (NOTE) Calculated using the CKD-EPI Creatinine Equation (2021)    Anion gap 13 5 - 15    Comment: Performed at [facility], [address] [place], [place] [address]  Magnesium     Status: Abnormal   Collection Time: 01/08/21  5:00 AM  Result Value Ref Range   Magnesium 2.5 (H) 1.7 - 2.4 mg/dL    Comment: Performed at [facility], [address] [place],  [address]  Glucose, capillary     Status: None   Collection Time: 01/08/21  7:59 AM  Result Value Ref Range   Glucose-Capillary 98 70 - 99 mg/dL  Comment: Glucose reference range applies only to samples taken after fasting for at least 8 hours.  Lactic acid, plasma     Status: Abnormal   Collection Time: 01/08/21  9:22 AM  Result Value Ref Range   Lactic Acid, Venous 3.7 (HH) 0.5 - 1.9 mmol/L    Comment: CRITICAL RESULT CALLED TO, READ BACK BY AND VERIFIED WITH  ASHLEY RAMIREZ AT 1003 01/08/21 SDR Performed at [facility], [address] [place], [place] [address]   Glucose, capillary     Status: Abnormal   Collection Time: 01/08/21 11:43 AM  Result Value Ref Range   Glucose-Capillary 135 (H) 70 - 99 mg/dL    Comment: Glucose reference range  applies only to samples taken after fasting for at least 8 hours.  Lactic acid, plasma     Status: Abnormal   Collection Time: 01/08/21 12:14 PM  Result Value Ref Range   Lactic Acid, Venous 3.8 (HH) 0.5 - 1.9 mmol/L    Comment: CRITICAL VALUE NOTED. VALUE IS CONSISTENT WITH PREVIOUSLY REPORTED/CALLED VALUE KLW Performed at [facility], [address] [place], [place] [address]   Glucose, capillary     Status: Abnormal   Collection Time: 01/08/21  3:52 PM  Result Value Ref Range   Glucose-Capillary 158 (H) 70 - 99 mg/dL    Comment: Glucose reference range applies only to samples taken after fasting for at least 8 hours.    Current Facility-Administered Medications  Medication Dose Route Frequency Provider Last Rate Last Admin  . albuterol (VENTOLIN HFA) 108 (90 Base) MCG/ACT inhaler 2 puff  2 puff Inhalation Q4H PRN Ivor Costa, MD      . cholecalciferol (VITAMIN D) tablet 1,000 Units  1,000 Units Oral Daily Ivor Costa, MD   1,000 Units at 01/08/21 1225  . dextromethorphan-guaiFENesin (MUCINEX DM) 30-600 MG per 12 hr tablet 1 tablet  1 tablet Oral BID PRN Ivor Costa, MD      . dextrose 5 %-0.9 % sodium chloride infusion   Intravenous Continuous Allie Bossier, MD 100 mL/hr at 01/08/21 1535 New Bag at 01/08/21 1535  . donepezil (ARICEPT) tablet 10 mg  10 mg Oral QHS Ivor Costa, MD   10 mg at 01/07/21 2106  . dutasteride (AVODART) capsule 0.5 mg  0.5 mg Oral Daily Ivor Costa, MD   0.5 mg at 01/08/21 1228  . enoxaparin (LOVENOX) injection 60 mg  0.5 mg/kg Subcutaneous Q24H Dorothe Pea, RPH   60 mg at 01/08/21 1225  . feeding supplement (ENSURE ENLIVE / ENSURE PLUS) liquid 237 mL  237 mL Oral TID BM Sharen Hones, MD   237 mL at 01/08/21 1401  . fesoterodine (TOVIAZ) tablet 8 mg  8 mg Oral Daily Ivor Costa, MD   8 mg at 01/08/21 1229  . fluticasone (FLONASE) 50 MCG/ACT nasal spray 1 spray  1 spray Each Nare Daily Ivor Costa, MD   1 spray at 01/05/21 1055  . hydrALAZINE  (APRESOLINE) injection 5 mg  5 mg Intravenous Q2H PRN Ivor Costa, MD      . hydrOXYzine (ATARAX/VISTARIL) tablet 10 mg  10 mg Oral TID PRN Ivor Costa, MD      . hydrOXYzine (ATARAX/VISTARIL) tablet 20 mg  20 mg Oral QHS Ivor Costa, MD   20 mg at 01/07/21 2106  . hydrOXYzine (VISTARIL) injection 25 mg  25 mg Intramuscular Q6H PRN Ivor Costa, MD      . levothyroxine (SYNTHROID) tablet 50 mcg  50 mcg Oral Q0600  Ivor Costa, MD   50 mcg at 01/08/21 [phone]  . megestrol (MEGACE) 400 MG/10ML suspension 400 mg  400 mg Oral Daily Sharen Hones, MD   400 mg at 01/08/21 1228  . melatonin tablet 3 mg  3 mg Oral QHS Ivor Costa, MD   3 mg at 01/07/21 2106  . mirabegron ER (MYRBETRIQ) tablet 25 mg  25 mg Oral Daily Ivor Costa, MD   25 mg at 01/08/21 1229  . mometasone-formoterol (DULERA) 200-5 MCG/ACT inhaler 2 puff  2 puff Inhalation BID Ivor Costa, MD   2 puff at 01/07/21 2112  . multivitamin with minerals tablet 1 tablet  1 tablet Oral Daily Sharen Hones, MD   1 tablet at 01/08/21 1225  . oxyCODONE (Oxy IR/ROXICODONE) immediate release tablet 5 mg  5 mg Oral Q6H PRN Ivor Costa, MD   5 mg at 01/08/21 1225  . senna-docusate (Senokot-S) tablet 2 tablet  2 tablet Oral BID Sharen Hones, MD   2 tablet at 01/08/21 1225  . tamsulosin (FLOMAX) capsule 0.4 mg  0.4 mg Oral Daily Ivor Costa, MD   0.4 mg at 01/08/21 1225  . tiotropium (SPIRIVA) inhalation capsule ([facility] use ONLY) 18 mcg  18 mcg Inhalation Daily Ivor Costa, MD   18 mcg at 01/05/21 1056  . topiramate (TOPAMAX) tablet 50 mg  50 mg Oral BID Ivor Costa, MD   50 mg at 01/08/21 1228  . vitamin B-12 (CYANOCOBALAMIN) tablet 1,000 mcg  1,000 mcg Oral Daily Ivor Costa, MD   1,000 mcg at 01/08/21 1225    Musculoskeletal: Strength & Muscle Tone: decreased Gait & Station: unable to stand Patient leans: N/A  Psychiatric Specialty Exam: Physical Exam Vitals and nursing note reviewed.  Constitutional:      Appearance: He is well-developed and well-nourished.  HENT:      Head: Normocephalic and atraumatic.  Eyes:     Conjunctiva/sclera: Conjunctivae normal.     Pupils: Pupils are equal, round, and reactive to light.  Cardiovascular:     Heart sounds: Normal heart sounds.  Pulmonary:     Effort: Pulmonary effort is normal.  Abdominal:     Palpations: Abdomen is soft.  Musculoskeletal:        General: Normal range of motion.     Cervical back: Normal range of motion.  Skin:    General: Skin is warm and dry.  Neurological:     General: No focal deficit present.     Mental Status: He is alert.  Psychiatric:        Attention and Perception: He is inattentive.        Mood and Affect: Affect is blunt.        Speech: He is noncommunicative.     Review of Systems  Unable to perform ROS: Mental status change    Blood pressure 132/71, pulse 93, temperature 97.6 F (36.4 C), temperature source Oral, resp. rate 16, height 6' (1.829 m), weight 117.9 kg, SpO2 98 %.Body mass index is 35.26 kg/m.  General Appearance: Casual  Eye Contact:  Minimal  Speech:  Garbled  Volume:  Decreased  Mood:  Anxious and Dysphoric  Affect:  Constricted  Thought Process:  Disorganized  Orientation:  Negative  Thought Content:  Negative  Suicidal Thoughts:  Yes.  with intent/plan  Homicidal Thoughts:  No  Memory:  Immediate;   Fair Recent;   Poor Remote;   Poor  Judgement:  Impaired  Insight:  Shallow  Psychomotor Activity:  Decreased  Concentration:  Concentration: Poor  Recall:  Poor  Fund of Knowledge:  Negative  Language:  Negative  Akathisia:  Negative  Handed:  Right  AIMS (if indicated):     Assets:  Social Support  ADL's:  Impaired  Cognition:  Impaired,  Moderate and Severe  Sleep:        Treatment Plan Summary: Plan Patient remains delirious.  Doubtful that adding any medication for depression is going to help at this point.  Without agitation I am hesitant to add any antipsychotics.  We will continue to check in with patient and see if there is  any way we can help him to be more comfortable.  Seems to be declining at this point to the point where mental health intervention may not help.  Disposition: Patient does not meet criteria for psychiatric inpatient admission.  Alethia Berthold, MD 01/08/2021 6:06 PM

## 2021-01-08 NOTE — Progress Notes (Signed)
PROGRESS NOTE    LOTUS GOVER  DZH:[id number] DOB: 05/17/1941 DOA: 01/03/2021 PCP: [name], MD     Brief Narrative:  80 y.o.WM PMH Depression, Anxietyx DM type II, diet-controlled, COPD, Asthma, stroke, GERD, Hypothyroidism, , OSA on CPAP, IBS, urethral stricture, Diverticulitis, GI bleeding, BPH, Interstitial Lung disease, Dementia,   Presents with abdominal pain, chest pain, generalized weakness. CT abdomen/pelvis with contrast showed large left lower lobe mass with multiple liver metastasis.Liver biopsy performed on 2/14.   Subjective: Afebrile overnight does not respond other than to say he feels short of breath.  Per RN patient has been declining over last couple of days.   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 -Elevated liver function due to liver metastasis. -2/14 s/p liver biopsy; awaiting results -Patient is followed by oncology, biopsy results not available. -Decrease pain medication secondary to decreased cognition -2/16 Echocardiogram pending.  Patient with metastatic cancer  Interstitial lung disease/COPD -Continuous pulse ox -Telemetry  Lactic acidosis -Most likely secondary to metastatic cancer -2/16 D5-0.9% saline  160ml/hr  Dementia/Altered Mental Status? -We will contact son for further clarification on severity  Severe suicidal thoughts -2/16 per psychiatry's note;Yesterday when I saw him he was still pretty delirious.  He could rouse himself at times to say that he did wish he was dead and had thoughts about shooting himself but was not able to stay awake long enough to have any more conversation about it and for the most part stay del irious.  -Psychiatry to evaluate but again given patient's poor mental status unlikely to yield any significant information.  Failure to thrive/Anorexia -Megace 400 mg daily -Minimize sedating medication -Encourage patient to eat. -Pending SNF placement  DM type II controlled without complication -4/19 hemoglobin A1c= 5.0 -Sensitive SSI   Goals of care -2/16 palliative care pending    DVT prophylaxis:  Code Status: DNR Family Communication:  Status is: Inpatient    Dispo: The patient is from: Home              Anticipated d/c is to: SNF vs hospice              Anticipated d/c date is: 2/19              Patient currently extremely unstable      Consultants:  Psychiatry Oncology  Procedures/Significant Events:    I have personally reviewed and interpreted all radiology studies and my findings are as above.  VENTILATOR SETTINGS:    Cultures   Antimicrobials: Anti-infectives (From admission, onward)   None       Devices    LINES / TUBES:      Continuous Infusions:   Objective: Vitals:   01/07/21 1926 01/08/21 0001 01/08/21 0346 01/08/21  0759  BP: 130/62 117/60 132/67 136/60  Pulse: 96 88 89 92  Resp: (!) 22 20 (!) 21 16  Temp: 99.2 F (37.3 C) 98.2 F (36.8 C) 98.7 F (37.1 C) 99.3 F (37.4 C)  TempSrc:    Oral  SpO2: 93% 93% 92% 91%  Weight:      Height:       No intake or output data in the 24 hours ending 01/08/21 0903 Filed Weights   01/03/21 1104  Weight: 117.9 kg    Examination:  General:  A/O x1 (does not know where, when, why), difficult to arouse, No acute respiratory distress Eyes: negative scleral hemorrhage, negative anisocoria, negative icterus ENT: Negative Runny nose, negative gingival bleeding, Neck:  Negative scars, masses, torticollis, lymphadenopathy, JVD Lungs: Clear to auscultation bilaterally without wheezes or crackles Cardiovascular: Regular rate and rhythm without murmur gallop or rub normal S1 and S2 Abdomen:  OBESE positive abdominal pain to palpation RUQ, nondistended, positive soft, bowel sounds, no rebound, no ascites, no appreciable mass Extremities: No significant cyanosis, clubbing, or edema bilateral lower extremities Skin: Negative rashes, lesions, ulcers Psychiatric: Unable to evaluate altered mental status Central nervous system: Able to evaluate altered mental status .     Data Reviewed: Care during the described time interval was provided by me .  I have reviewed this patient's available data, including medical history, events of note, physical examination, and all test results as part of my evaluation.  CBC: Recent Labs  Lab 01/03/21 1111 01/04/21 0604 01/05/21 0800 01/08/21 0500  WBC 9.1 8.7 9.4 9.9  NEUTROABS 6.7  --   --  7.4  HGB 15.2 12.8* 14.2 12.7*  HCT 47.0 39.6 44.4 39.8  MCV 91.1 92.1 93.1 93.4  PLT 230 197 185 951   Basic Metabolic Panel: Recent Labs  Lab 01/03/21 1111 01/04/21 0604 01/08/21 0500  NA 135 137 139  K 3.8 4.2 4.0  CL 96* 99 101  CO2 26 26 25   GLUCOSE 93 92 109*  BUN 10 13 21   CREATININE 0.90 0.89 1.09  CALCIUM 9.3 9.0 9.1  MG  --   --  2.5*   GFR: Estimated Creatinine Clearance: 74 mL/min (by C-G formula based on SCr of 1.09 mg/dL). Liver Function Tests: Recent Labs  Lab 01/03/21 1111 01/04/21 0604  AST 261* 229*  ALT 102* 83*  ALKPHOS 616* 528*  BILITOT 3.3* 2.7*  PROT 7.6 6.0*  ALBUMIN 3.4* 2.7*   Recent Labs  Lab 01/03/21 1111  LIPASE 45   No results for input(s): AMMONIA in  the last 168 hours. Coagulation Profile: Recent Labs  Lab 01/03/21 1849 01/06/21 0947  INR 1.2 1.3*   Cardiac Enzymes: No results for input(s): CKTOTAL, CKMB, CKMBINDEX, TROPONINI in the last 168 hours. BNP (last 3 results) No results for input(s): PROBNP in the last 8760 hours. HbA1C: No results for input(s): HGBA1C in the last 72 hours. CBG: Recent Labs  Lab 01/04/21 0816 01/04/21 1138 01/04/21 1557 01/04/21 2128 01/08/21 0759  GLUCAP 88 135* 126* 100* 98   Lipid Profile: No results for input(s): CHOL, HDL, LDLCALC, TRIG, CHOLHDL, LDLDIRECT in the last 72 hours. Thyroid Function Tests: No results for input(s): TSH, T4TOTAL, FREET4, T3FREE, THYROIDAB in the last 72 hours. Anemia Panel: No results for input(s): VITAMINB12, FOLATE, FERRITIN, TIBC, IRON, RETICCTPCT in the last 72 hours. Sepsis Labs: Recent Labs  Lab 01/03/21 1111 01/03/21 1410  LATICACIDVEN 3.4* 2.5*    Recent Results (from the past 240 hour(s))  Culture, blood (Routine X 2) w Reflex to ID Panel     Status: None   Collection Time: 01/03/21 11:11 AM   Specimen: BLOOD  Result Value Ref Range Status   Specimen Description BLOOD LEFT FOREARM  Final   Special Requests   Final    BOTTLES DRAWN AEROBIC AND ANAEROBIC Blood Culture adequate volume   Culture   Final    NO GROWTH 5 DAYS Performed at [facility], [address], [place], [place] [address]    Report Status 01/08/2021 FINAL  Final  Culture, blood (Routine X 2) w Reflex to ID Panel     Status: None   Collection Time: 01/03/21 11:11 AM   Specimen: BLOOD  Result Value Ref Range Status   Specimen Description BLOOD LEFT AC  Final   Special Requests   Final    BOTTLES DRAWN AEROBIC AND ANAEROBIC Blood Culture adequate volume   Culture   Final    NO GROWTH 5 DAYS Performed at [facility], [address] [place], [place] [address]    Report Status 01/08/2021 FINAL  Final  Resp Panel by RT-PCR (Flu A&B, Covid)  Nasopharyngeal Swab     Status: None   Collection Time: 01/03/21  3:55 PM   Specimen: Nasopharyngeal Swab; Nasopharyngeal(NP) swabs in vial transport medium  Result Value Ref Range Status   SARS Coronavirus 2 by RT PCR NEGATIVE NEGATIVE Final    Comment: (NOTE) SARS-CoV-2 target nucleic acids are NOT DETECTED.  The SARS-CoV-2 RNA is generally detectable in upper respiratory specimens during the acute phase of infection. The lowest concentration of SARS-CoV-2 viral copies this assay can detect is 138 copies/mL. A negative result does not preclude SARS-Cov-2 infection and should not be used as the sole basis for treatment or other patient management decisions. A negative result may occur with  improper specimen collection/handling, submission of specimen other than nasopharyngeal swab, presence of viral mutation(s) within the areas targeted by this assay, and inadequate number of viral copies(<138 copies/mL). A negative result must be combined with clinical observations, patient history, and epidemiological information. The expected result is Negative.  Fact Sheet for Patients:  EntrepreneurPulse.com.au  Fact Sheet for Healthcare Providers:  IncredibleEmployment.be  This test is no t yet approved or cleared by the [place] FDA and  has been authorized for detection and/or diagnosis of SARS-CoV-2 by FDA under an Emergency Use Authorization (EUA). This EUA will remain  in effect (meaning this test can be used) for the duration of the COVID-19 declaration under Section 564(b)(1) of the Act, 21 U.S.C.section 360bbb-3(b)(1), unless the authorization is terminated  or revoked sooner.       Influenza A by PCR NEGATIVE NEGATIVE Final   Influenza B by PCR NEGATIVE NEGATIVE Final    Comment: (NOTE) The Xpert Xpress SARS-CoV-2/FLU/RSV plus assay is intended as an aid in the diagnosis of influenza from Nasopharyngeal swab specimens and should not be  used as a sole basis for treatment. Nasal washings and aspirates are unacceptable for Xpert Xpress SARS-CoV-2/FLU/RSV testing.  Fact Sheet for Patients: EntrepreneurPulse.com.au  Fact Sheet for Healthcare Providers: IncredibleEmployment.be  This test is not yet approved or cleared by the [place] FDA and has been authorized for detection and/or diagnosis of SARS-CoV-2 by FDA under an Emergency Use Authorization (EUA). This EUA will remain in effect (meaning this test can be used) for the duration of the COVID-19 declaration under Section 564(b)(1) of the Act, 21 U.S.C. section 360bbb-3(b)(1), unless the authorization is  terminated or revoked.  Performed at [facility], [address] [place], [place] [address]   Urine Culture     Status: None   Collection Time: 01/03/21  4:23 PM   Specimen: Urine, Random  Result Value Ref Range Status   Specimen Description   Final    URINE, RANDOM Performed at [facility], [address] [place], [place] [address]    Special Requests   Final    NONE Performed at [facility], [address], [place], [place] [address]    Culture   Final    NO GROWTH Performed at [facility], [place] [address], [place], [place] [address]    Report Status 01/05/2021 FINAL  Final         Radiology Studies: US BIOPSY (LIVER)  Result Date: 01/06/2021 INDICATION: Lung and multiple liver lesions suspicious for metastatic disease EXAM: Ultrasound-guided biopsy of liver lesions MEDICATIONS: None. ANESTHESIA/SEDATION: None COMPLICATIONS: None immediate. PROCEDURE: Informed written consent was obtained from the patient after a thorough discussion of the procedural risks, benefits and alternatives. All questions were addressed. Maximal Sterile Barrier Technique was utilized including caps, mask, sterile gowns, sterile gloves, sterile drape, hand hygiene and skin antiseptic. A timeout was  performed prior to the initiation of the procedure. Patient position supine on the ultrasound table. Epigastric skin prepped and draped in usual sterile fashion. Following local lidocaine administration, 17 gauge introducer needle was advanced into 1 of the left hepatic lobe lesions, and four 18 gauge cores were obtained utilizing continuous ultrasound guidance. Gelfoam slurry was administered through the introducer needle at the biopsy site. Samples were sent to pathology in formalin. Needle removed and hemostasis achieved with 5 minutes of manual compression. Post procedure ultrasound images showed no evidence of significant hemorrhage. IMPRESSION: Ultrasound-guided biopsy of left liver lesion as above. Electronically Signed   By: Miachel Roux M.D.   On: 01/06/2021 13:46        Scheduled Meds: . cholecalciferol  1,000 Units Oral Daily  . donepezil  10 mg Oral QHS  . dutasteride  0.5 mg Oral Daily  . enoxaparin (LOVENOX) injection  0.5 mg/kg Subcutaneous Q24H  . feeding supplement  237 mL Oral TID BM  . fesoterodine  8 mg Oral Daily  . fluticasone  1 spray Each Nare Daily  . hydrOXYzine  20 mg Oral QHS  . levothyroxine  50 mcg Oral Q0600  . megestrol  400 mg Oral Daily  . melatonin  3 mg Oral QHS  . mirabegron ER  25 mg Oral Daily  . mometasone-formoterol  2 puff Inhalation BID  . multivitamin with minerals  1 tablet Oral Daily  . senna-docusate  2 tablet Oral BID  . tamsulosin  0.4 mg Oral Daily  . tiotropium  18 mcg Inhalation Daily  . topiramate  50 mg Oral BID  . vitamin B-12  1,000 mcg Oral Daily   Continuous Infusions:   LOS: 5 days    Time spent:40 min    Shuntel Fishburn, Geraldo Docker, MD [facility]   If 7PM-7AM, please contact night-coverage 01/08/2021, 9:03 AM

## 2021-01-08 NOTE — Progress Notes (Signed)
*  PRELIMINARY RESULTS* Echocardiogram 2D Echocardiogram+ has been performed.  Eddie Carter 01/08/2021, 1:22 PM

## 2021-01-08 NOTE — Progress Notes (Signed)
OT Cancellation Note  Patient Details Name: Eddie Carter MRN: [id number] DOB: 1941/07/24   Cancelled Treatment:    Reason Eval/Treat Not Completed: Patient declined, stated he was "too tired" to participate today. Will attempt to see at a later date, as patient is available and willing.  Josiah Lobo 01/08/2021, 4:13 PM

## 2021-01-08 NOTE — Progress Notes (Signed)
Physical Therapy Treatment Patient Details Name: Eddie Carter MRN: [id number] DOB: 02-Apr-1941 Today's Date: 01/08/2021    History of Present Illness 80 y.o. male with medical history significant of diet-controlled diabetes, COPD, asthma, stroke, GERD, hypothyroidism, depression, anxiety, OSA on CPAP, IBS, urethral stricture, diverticulitis, GI bleeding, BPH, interstitial lung disease, dementia, who presents with abdominal pain, chest pain, generalized weakness.  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    PT Comments    Pt seen this am, remains lethargic and somnolent in bed, able to arouse pt yet unable to stay focused/awake in order to actively participate in ROM.  Pt with c/o Right hip soreness with flexion.  Unable to attempt bed mobility due to continued delerium.  Pt awaiting transfer to [facility] once medically stable.    Follow Up Recommendations  SNF     Equipment Recommendations  None recommended by PT    Recommendations for Other Services       Precautions / Restrictions Precautions Precautions: Fall Restrictions Weight Bearing Restrictions: No    Mobility  Bed Mobility                    Transfers                    Ambulation/Gait                 Stairs             Wheelchair Mobility    Modified Rankin (Stroke Patients Only)       Balance                                            Cognition Arousal/Alertness: Lethargic Behavior During Therapy: Flat affect Overall Cognitive Status: No family/caregiver present to determine baseline cognitive functioning                                 General Comments: patient required increased time for motor planning and following commands. patient able to follow single step commands consistently with extra time. delayed verbal responses with communicaton      Exercises General Exercises -  Lower Extremity Ankle Circles/Pumps: PROM;Both;10 reps Short Arc Quad: PROM;Both;10 reps Heel Slides: PROM;Both;10 reps;Other (comment) (modified range on R due to pain) Hip ABduction/ADduction: PROM;Both;10 reps    General Comments General comments (skin integrity, edema, etc.): Pt assisted with repositioning, heels floated off bed. Poor tolerance for activity.      Pertinent Vitals/Pain Pain Assessment: Faces Faces Pain Scale: Hurts little more Pain Location: Right hp with PROM Pain Descriptors / Indicators: Sore Pain Intervention(s): Limited activity within patient's tolerance    Home Living                      Prior Function            PT Goals (current goals can now be found in the care plan section) Acute Rehab PT Goals Patient Stated Goal: to be able to get to wheelchair    Frequency    Min 2X/week      PT Plan Current plan remains appropriate    Co-evaluation              AM-PAC PT "6 Clicks"  Mobility   Outcome Measure  Help needed turning from your back to your side while in a flat bed without using bedrails?: A Lot Help needed moving from lying on your back to sitting on the side of a flat bed without using bedrails?: A Lot Help needed moving to and from a bed to a chair (including a wheelchair)?: A Lot Help needed standing up from a chair using your arms (e.g., wheelchair or bedside chair)?: A Lot Help needed to walk in hospital room?: Total Help needed climbing 3-5 steps with a railing? : Total 6 Click Score: 10    End of Session   Activity Tolerance: Patient limited by fatigue Patient left: in bed;with call bell/phone within reach;with bed alarm set Nurse Communication: Mobility status PT Visit Diagnosis: Unsteadiness on feet (R26.81);Muscle weakness (generalized) (M62.81);Difficulty in walking, not elsewhere classified (R26.2)     Time: 1028-9022 PT Time Calculation (min) (ACUTE ONLY): 15 min  Charges:  $Therapeutic Exercise:  8-22 mins                     Mikel Cella, PTA   Eddie Carter 01/08/2021, 10:05 AM

## 2021-01-09 ENCOUNTER — Other Ambulatory Visit: Payer: Self-pay | Admitting: Oncology

## 2021-01-09 DIAGNOSIS — J431 Panlobular emphysema: Secondary | ICD-10-CM | POA: Diagnosis not present

## 2021-01-09 DIAGNOSIS — Z7189 Other specified counseling: Secondary | ICD-10-CM | POA: Diagnosis not present

## 2021-01-09 DIAGNOSIS — Z515 Encounter for palliative care: Secondary | ICD-10-CM | POA: Diagnosis not present

## 2021-01-09 DIAGNOSIS — C7A8 Other malignant neuroendocrine tumors: Secondary | ICD-10-CM | POA: Diagnosis not present

## 2021-01-09 DIAGNOSIS — R410 Disorientation, unspecified: Secondary | ICD-10-CM

## 2021-01-09 DIAGNOSIS — R630 Anorexia: Secondary | ICD-10-CM | POA: Diagnosis not present

## 2021-01-09 DIAGNOSIS — C7B8 Other secondary neuroendocrine tumors: Secondary | ICD-10-CM | POA: Diagnosis not present

## 2021-01-09 DIAGNOSIS — R945 Abnormal results of liver function studies: Secondary | ICD-10-CM | POA: Diagnosis not present

## 2021-01-09 LAB — COMPREHENSIVE METABOLIC PANEL
ALT: 73 U/L — ABNORMAL HIGH (ref 0–44)
AST: 224 U/L — ABNORMAL HIGH (ref 15–41)
Albumin: 2.5 g/dL — ABNORMAL LOW (ref 3.5–5.0)
Alkaline Phosphatase: 491 U/L — ABNORMAL HIGH (ref 38–126)
Anion gap: 11 (ref 5–15)
BUN: 19 mg/dL (ref 8–23)
CO2: 25 mmol/L (ref 22–32)
Calcium: 9.3 mg/dL (ref 8.9–10.3)
Chloride: 103 mmol/L (ref 98–111)
Creatinine, Ser: 0.93 mg/dL (ref 0.61–1.24)
GFR, Estimated: >60 mL/min (ref >60)
Glucose, Bld: 117 mg/dL — ABNORMAL HIGH (ref 70–99)
Potassium: 3.8 mmol/L (ref 3.5–5.1)
Sodium: 139 mmol/L (ref 135–145)
Total Bilirubin: 2.5 mg/dL — ABNORMAL HIGH (ref 0.3–1.2)
Total Protein: 6.2 g/dL — ABNORMAL LOW (ref 6.5–8.1)

## 2021-01-09 LAB — CBC WITH DIFFERENTIAL/PLATELET
Abs Immature Granulocytes: 0.11 10*3/uL — ABNORMAL HIGH (ref 0.00–0.07)
Basophils Absolute: 0.1 10*3/uL (ref 0.0–0.1)
Basophils Relative: 1 %
Eosinophils Absolute: 0.2 10*3/uL (ref 0.0–0.5)
Eosinophils Relative: 2 %
HCT: 39.2 % (ref 39.0–52.0)
Hemoglobin: 12.5 g/dL — ABNORMAL LOW (ref 13.0–17.0)
Immature Granulocytes: 1 %
Lymphocytes Relative: 13 %
Lymphs Abs: 1.2 10*3/uL (ref 0.7–4.0)
MCH: 30 pg (ref 26.0–34.0)
MCHC: 31.9 g/dL (ref 30.0–36.0)
MCV: 94.2 fL (ref 80.0–100.0)
Monocytes Absolute: 1.1 10*3/uL — ABNORMAL HIGH (ref 0.1–1.0)
Monocytes Relative: 11 %
Neutro Abs: 7 10*3/uL (ref 1.7–7.7)
Neutrophils Relative %: 72 %
Platelets: 164 10*3/uL (ref 150–400)
RBC: 4.16 MIL/uL — ABNORMAL LOW (ref 4.22–5.81)
RDW: 18.6 % — ABNORMAL HIGH (ref 11.5–15.5)
WBC: 9.7 10*3/uL (ref 4.0–10.5)
nRBC: 0 % (ref 0.0–0.2)

## 2021-01-09 LAB — MAGNESIUM: Magnesium: 2.4 mg/dL (ref 1.7–2.4)

## 2021-01-09 LAB — GLUCOSE, CAPILLARY
Glucose-Capillary: 103 mg/dL — ABNORMAL HIGH (ref 70–99)
Glucose-Capillary: 101 mg/dL — ABNORMAL HIGH (ref 70–99)
Glucose-Capillary: 121 mg/dL — ABNORMAL HIGH (ref 70–99)
Glucose-Capillary: 132 mg/dL — ABNORMAL HIGH (ref 70–99)
Glucose-Capillary: 124 mg/dL — ABNORMAL HIGH (ref 70–99)

## 2021-01-09 LAB — LACTIC ACID, PLASMA
Lactic Acid, Venous: 4.1 mmol/L (ref 0.5–1.9)
Lactic Acid, Venous: 4.2 mmol/L (ref 0.5–1.9)

## 2021-01-09 LAB — SURGICAL PATHOLOGY

## 2021-01-09 LAB — AMMONIA: Ammonia: 42 umol/L — ABNORMAL HIGH (ref 9–35)

## 2021-01-09 LAB — PHOSPHORUS: Phosphorus: 2.9 mg/dL (ref 2.5–4.6)

## 2021-01-09 MED ORDER — SORBITOL 70 % SOLN
960.0000 mL | TOPICAL_OIL | Freq: Once | ORAL | Status: AC
  Administered 2021-01-09: 960 mL via RECTAL
  Filled 2021-01-09: qty 473

## 2021-01-09 MED ORDER — ENSURE ENLIVE PO LIQD: 237.0000 mL | Freq: Three times a day (TID) | ORAL | Status: DC

## 2021-01-09 MED ORDER — SODIUM CHLORIDE 0.9 % IV BOLUS
1000.0000 mL | Freq: Once | INTRAVENOUS | Status: AC
  Administered 2021-01-09: 1000 mL via INTRAVENOUS

## 2021-01-09 NOTE — Progress Notes (Signed)
Dr Sherral Hammers made aware of lactic 4.2, acknowledged, bolus fluids still infusing

## 2021-01-09 NOTE — Progress Notes (Signed)
PT Cancellation Note  Patient Details Name: Eddie Carter MRN: [id number] DOB: 20-Sep-1941   Cancelled Treatment:     Treatment held, pt remains with critically high lactic acid levels currently at 4.1  Pt not appropriate for skilled PT services this date.  Will check back for tomorrow tolerance status.     Josie Dixon 01/09/2021, 9:31 AM

## 2021-01-09 NOTE — Progress Notes (Signed)
OT Cancellation Note  Patient Details Name: Eddie Carter MRN: [id number] DOB: 09-19-1941   Cancelled Treatment:    Reason Eval/Treat Not Completed: Patient not medically ready; Patient's level of consciousness. Pt's lactic acid level critically high, pt in bed, grossly non-responsive. Per RN, pt not appropriate for tx today. Will attempt at a later date as pt's condition allows.  Josiah Lobo 01/09/2021, 2:22 PM

## 2021-01-09 NOTE — Progress Notes (Addendum)
Dr Sherral Hammers aware of lactic 4.1, acknowledged, new orders placed

## 2021-01-09 NOTE — Progress Notes (Signed)
PROGRESS NOTE    Eddie Carter  WER:[id number] DOB: 1941-09-18 DOA: 01/03/2021 PCP: [name], MD     Brief Narrative:  80 y.o.WM PMH Depression, Anxietyx DM type II, diet-controlled, COPD, Asthma, stroke, GERD, Hypothyroidism, , OSA on CPAP, IBS, urethral stricture, Diverticulitis, GI bleeding, BPH, Interstitial Lung disease, Dementia,   Presents with abdominal pain, chest pain, generalized weakness. CT abdomen/pelvis with contrast showed large left lower lobe mass with multiple liver metastasis.Liver biopsy performed on 2/14.   Subjective: 2/17 afebrile overnight A/O x1 (does not know where, when, why).  Withdrawal to painful stimuli.    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 -Elevated liver function due to liver metastasis. -2/14 s/p liver biopsy; awaiting results -Patient is followed by oncology, biopsy results not available. -Decrease pain medication secondary to decreased cognition -2/16 Echocardiogram pending.  Patient with metastatic cancer  Interstitial lung disease/COPD -Continuous pulse ox -Telemetry  Lactic acidosis -Most likely secondary to metastatic cancer -2/16 D5-0.9% saline 182ml/hr -2/17 increasing lactic acidosis  bolus normal saline 12ml/hr  Dementia/Altered Mental Status -2/17 spoke with son and patient's baseline mental status not fully appreciated per son, has been slowly deteriorating .   -2/17 ammonia level; minimally elevated certainly not enough to account for patient's altered mental status.   Severe suicidal thoughts -2/16 per psychiatry's note;Yesterday when I saw him he was still pretty delirious.  He could rouse himself at times to say that he did wish he was dead and had thoughts about shooting himself but was not able to stay awake long enough to have any more conversation about it and for the most part stay delirious.  -Psychiatry to evaluate but again given patient's poor mental status unlikely to yield any significant information.  Failure to thrive/Anorexia -Megace 400 mg daily -Minimize sedating medication -Encourage patient to eat. -Pending SNF placement  DM type II controlled without complication -1/95 hemoglobin A1c= 5.0 -Sensitive SSI  Elevated liver enzymes -Secondary to metastatic cancer.  This along with patient's increasing lactic acidosis despite aggressive treatment poor prognostic factor for survival   Goals of care -2/17 palliative care; 2/17 spoke RN [name] (son) spoke at length with son and he expressed wish that his father be comfortable and passed with dignity.  Would like palliative care to become involved discussed options.    DVT prophylaxis:  Code Status: DNR Family Communication: 2/17 spoke RN [name] (son) spoke at length with son and he expressed wish that his father be comfortable and passed with dignity.  Would like palliative care to become involved discussed options. Status is: Inpatient    Dispo: The patient is from: Home              Anticipated d/c is to: SNF vs Hospice  Anticipated d/c date is: 2/19              Patient currently extremely unstable      Consultants:  Psychiatry Oncology  Procedures/Significant Events:   2/16 Echocardiogram;Left Ventricle: LVEF=60 to 65%.-. Left ventricular diastolic function  could not be evaluated.    I have personally reviewed and interpreted all radiology studies and my findings are as above.  VENTILATOR SETTINGS: Room air 2/17 SPO2 96%   Cultures   Antimicrobials: Anti-infectives (From admission, onward)   None       Devices    LINES / TUBES:      Continuous Infusions: . dextrose 5 % and 0.9% NaCl 100 mL/hr at 01/09/21 0446     Objective: Vitals:   01/08/21 2007 01/08/21 2353 01/09/21 0409 01/09/21 0800  BP: 131/72 133/67 128/64 131/65  Pulse: 87 90 84 89  Resp: 18 18 18 20   Temp: 98.2 F (36.8 C) 98.1 F (36.7 C) 98.2 F (36.8 C) 98.8 F (37.1 C)  TempSrc: Oral Oral Oral   SpO2: 97% 98% 97% 96%  Weight:      Height:        Intake/Output Summary (Last 24 hours) at 01/09/2021 4196 Last data filed at 01/09/2021 0441 Gross per 24 hour  Intake 1482.14 ml  Output --  Net 1482.14 ml   Filed Weights   01/03/21 1104  Weight: 117.9 kg    Examination:  General: A/O x1 (does not know where, when, why), difficult to arouse, No acute respiratory distress Eyes: negative scleral hemorrhage, negative anisocoria, negative icterus ENT: Negative Runny nose, negative gingival bleeding, Neck:  Negative scars, masses, torticollis, lymphadenopathy, JVD Lungs: Clear to auscultation bilaterally without wheezes or crackles Cardiovascular: Regular rate and rhythm without murmur gallop or rub normal S1 and S2 Abdomen:  OBESE positive abdominal pain to palpation RUQ, nondistended, positive soft, bowel sounds, no rebound, no ascites, no appreciable mass Extremities: No significant cyanosis, clubbing, or edema bilateral lower extremities Skin: Negative rashes, lesions, ulcers Psychiatric: Unable to evaluate altered mental status Central nervous system: Minimally responsive to painful stimuli .     Data Reviewed: Care during the described time  interval was provided by me .  I have reviewed this patient's available data, including medical history, events of note, physical examination, and all test results as part of my evaluation.  CBC: Recent Labs  Lab 01/03/21 1111 01/04/21 0604 01/05/21 0800 01/08/21 0500 01/09/21 0714  WBC 9.1 8.7 9.4 9.9 9.7  NEUTROABS 6.7  --   --  7.4 7.0  HGB 15.2 12.8* 14.2 12.7* 12.5*  HCT 47.0 39.6 44.4 39.8 39.2  MCV 91.1 92.1 93.1 93.4 94.2  PLT 230 197 185 166 222   Basic Metabolic Panel: Recent Labs  Lab 01/03/21 1111 01/04/21 0604 01/08/21 0500 01/09/21 0714  NA 135 137 139 139  K 3.8 4.2 4.0 3.8  CL 96* 99 101 103  CO2 26 26 25 25   GLUCOSE 93 92 109* 117*  BUN 10 13 21 19   CREATININE 0.90 0.89 1.09 0.93  CALCIUM 9.3 9.0 9.1 9.3  MG  --   --  2.5* 2.4  PHOS  --   --   --  2.9   GFR: Estimated Creatinine Clearance: 86.8 mL/min (by C-G formula based on SCr of 0.93 mg/dL). Liver Function Tests: Recent Labs  Lab 01/03/21 1111 01/04/21 0604 01/09/21 0714  AST 261* 229* 224*  ALT 102* 83* 73*  ALKPHOS 616* 528* 491*  BILITOT  3.3* 2.7* 2.5*  PROT 7.6 6.0* 6.2*  ALBUMIN 3.4* 2.7* 2.5*   Recent Labs  Lab 01/03/21 1111  LIPASE 45   No results for input(s): AMMONIA in the last 168 hours. Coagulation Profile: Recent Labs  Lab 01/03/21 1849 01/06/21 0947  INR 1.2 1.3*   Cardiac Enzymes: No results for input(s): CKTOTAL, CKMB, CKMBINDEX, TROPONINI in the last 168 hours. BNP (last 3 results) No results for input(s): PROBNP in the last 8760 hours. HbA1C: No results for input(s): HGBA1C in the last 72 hours. CBG: Recent Labs  Lab 01/08/21 1552 01/08/21 2115 01/08/21 2352 01/09/21 0408 01/09/21 0752  GLUCAP 158* 166* 146* 103* 101*   Lipid Profile: No results for input(s): CHOL, HDL, LDLCALC, TRIG, CHOLHDL, LDLDIRECT in the last 72 hours. Thyroid Function Tests: No results for input(s): TSH, T4TOTAL, FREET4, T3FREE, THYROIDAB in the last 72 hours. Anemia  Panel: No results for input(s): VITAMINB12, FOLATE, FERRITIN, TIBC, IRON, RETICCTPCT in the last 72 hours. Sepsis Labs: Recent Labs  Lab 01/03/21 1111 01/03/21 1410 01/08/21 0922 01/08/21 1214  LATICACIDVEN 3.4* 2.5* 3.7* 3.8*    Recent Results (from the past 240 hour(s))  Culture, blood (Routine X 2) w Reflex to ID Panel     Status: None   Collection Time: 01/03/21 11:11 AM   Specimen: BLOOD  Result Value Ref Range Status   Specimen Description BLOOD LEFT FOREARM  Final   Special Requests   Final    BOTTLES DRAWN AEROBIC AND ANAEROBIC Blood Culture adequate volume   Culture   Final    NO GROWTH 5 DAYS Performed at [facility], [address] [place], [place] [address]    Report Status 01/08/2021 FINAL  Final  Culture, blood (Routine X 2) w Reflex to ID Panel     Status: None   Collection Time: 01/03/21 11:11 AM   Specimen: BLOOD  Result Value Ref Range Status   Specimen Description BLOOD LEFT AC  Final   Special Requests   Final    BOTTLES DRAWN AEROBIC AND ANAEROBIC Blood Culture adequate volume   Culture   Final    NO GROWTH 5 DAYS Performed at [facility], [address] [place], [place] [address]    Report Status 01/08/2021 FINAL  Final  Resp Panel by RT-PCR (Flu A&B, Covid) Nasopharyngeal Swab     Status: None   Collection Time: 01/03/21  3:55 PM   Specimen: Nasopharyngeal Swab; Nasopharyngeal(NP) swabs in vial transport medium  Result Value Ref Range Status   SARS Coronavirus 2 by RT PCR NEGATIVE NEGATIVE Final    Comment: (NOTE) SARS-CoV-2 target nucleic acids are NOT DETECTED.  The SARS-CoV-2 RNA is generally detectable in upper respiratory specimens during the acute phase of infection. The lowest concentration of SARS-CoV-2 viral copies this assay can detect is 138 copies/mL. A negative result does not preclude SARS-Cov-2 infection and should not be used as the sole basis for treatment or other patient management decisions. A  negative result may occur with  improper specimen collection/handling, submission of specimen other than nasopharyngeal swab, presence of viral mutation(s) within the areas targeted by this assay, and inadequate number of viral copies(<138 copies/mL). A negative result must be combined with clinical observations, patient history, and epidemiological information. The expected result is Negative.  Fact Sheet for Patients:  EntrepreneurPulse.com.au  Fact Sheet for Healthcare Providers:  IncredibleEmployment.be  This test is no t yet approved or cleared by the [place] and  has been authorized for  detection and/or diagnosis of SARS-CoV-2 by FDA under an Emergency Use Authorization (EUA). This EUA will remain  in effect (meaning this test can be used) for the duration of the COVID-19 declaration under Section 564(b)(1) of the Act, 21 U.S.C.section 360bbb-3(b)(1), unless the authorization is terminated  or revoked sooner.       Influenza A by PCR NEGATIVE NEGATIVE Final   Influenza B by PCR NEGATIVE NEGATIVE Final    Comment: (NOTE) The Xpert Xpress SARS-CoV-2/FLU/RSV plus assay is intended as an aid in the diagnosis of influenza from Nasopharyngeal swab specimens and should not be used as a sole basis for treatment. Nasal washings and aspirates are unacceptable for Xpert Xpress SARS-CoV-2/FLU/RSV testing.  Fact Sheet for Patients: EntrepreneurPulse.com.au  Fact Sheet for Healthcare Providers: IncredibleEmployment.be  This test is not yet approved or cleared by the [place] FDA and has been authorized for detection and/or diagnosis of SARS-CoV-2 by FDA under an Emergency Use Authorization (EUA). This EUA will remain in effect (meaning this test can be used) for the duration of the COVID-19 declaration under Section 564(b)(1) of the Act, 21 U.S.C. section 360bbb-3(b)(1), unless the authorization  is terminated or revoked.  Performed at [facility], [address] [place], [place] [address]   Urine Culture     Status: None   Collection Time: 01/03/21  4:23 PM   Specimen: Urine, Random  Result Value Ref Range Status   Specimen Description   Final    URINE, RANDOM Performed at [facility], [address], [place], [place] [address]    Special Requests   Final    NONE Performed at [facility], [address] [place], [place] [address]    Culture   Final    NO GROWTH Performed at [facility], [place] [address] [place], [place] [address]    Report Status 01/05/2021 FINAL  Final         Radiology Studies: ECHOCARDIOGRAM COMPLETE  Result Date: 01/08/2021    ECHOCARDIOGRAM REPORT   Patient Name:   Eddie Carter Date of Exam: 01/08/2021 Medical Rec #:  [id number]      Height:       72.0 in Accession #:    [id number]     Weight:       260.0 lb Date of Birth:  06-15-1941       BSA:          2.382 m Patient Age:    57 years       BP:           123/58 mmHg Patient Gender: M              HR:           91 bpm. Exam Location:  [facility] Procedure: 2D Echo, Color Doppler and Cardiac Doppler Indications:     CHF-acute systolic I45.80  History:         Patient has no prior history of Echocardiogram examinations.                  Stroke, Signs/Symptoms:Shortness of Breath; Risk Factors:Sleep                  Apnea.  Sonographer:     Sherrie Sport RDCS (AE) Referring Phys:  [id number] Geraldo Docker Timmie Calix Diagnosing Phys: Kate Sable MD  Sonographer Comments: No apical window, no subcostal window and Technically challenging study due to limited acoustic windows. IMPRESSIONS  1. Left ventricular  ejection fraction, by estimation, is 60 to 65%. The left ventricle has normal function. The left ventricle has no regional wall motion abnormalities. There is mild left ventricular hypertrophy. Left ventricular diastolic function could not be evaluated.  2. Right ventricular  systolic function is normal. The right ventricular size is not well visualized.  3. The mitral valve is degenerative. No evidence of mitral valve regurgitation. Moderate mitral annular calcification.  4. The aortic valve is normal in structure. Aortic valve regurgitation is not visualized.  5. Aortic dilatation noted. There is mild dilatation of the aortic root, measuring 39 mm. 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 FINDINGS  Left Ventricle: Left ventricular ejection fraction, by estimation, is 60 to 65%. The left ventricle has normal function. The left ventricle has no regional wall motion abnormalities. The left ventricular internal cavity size was normal in size. There is  mild left ventricular hypertrophy. Left ventricular diastolic function could not be evaluated. Right Ventricle: The right ventricular size is not well visualized. No increase in right ventricular wall thickness. Right ventricular systolic function is normal. Left Atrium: Left atrial size was normal in size. Right Atrium: Right atrial size was not well visualized. Pericardium: There is no evidence of pericardial effusion. Mitral Valve: The mitral valve is degenerative in appearance. Moderate mitral annular calcification. No evidence of mitral valve regurgitation. Tricuspid Valve: The tricuspid valve is normal in structure. Tricuspid valve regurgitation is not demonstrated. Aortic Valve: The aortic valve is normal in structure. Aortic valve regurgitation is not visualized. Pulmonic Valve: The pulmonic valve was not well visualized. Pulmonic valve regurgitation is not visualized. Aorta: Aortic dilatation noted. There is mild dilatation of the aortic root, measuring 39 mm. 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 Venous: The inferior vena cava was not well visualized. IAS/Shunts: The interatrial septum was not well visualized.  LEFT VENTRICLE PLAX 2D LVIDd:         4.54 cm  LVIDs:         2.62 cm LV PW:         1.45 cm LV IVS:        1.58 cm LVOT diam:     2.30 cm LVOT Area:     4.15 cm  LEFT ATRIUM         Index LA diam:    2.80 cm 1.18 cm/m                        PULMONIC VALVE AORTA                 PV Vmax:        0.48 m/s Ao Root diam: 4.13 cm PV Peak grad:   0.9 mmHg                       RVOT Peak grad: 2 mmHg   SHUNTS Systemic Diam: 2.30 cm Kate Sable MD Electronically signed by Kate Sable MD Signature Date/Time: 01/08/2021/3:58:28 PM    Final         Scheduled Meds: . cholecalciferol  1,000 Units Oral Daily  . donepezil  10 mg Oral QHS  . dutasteride  0.5 mg Oral Daily  . enoxaparin (LOVENOX) injection  0.5 mg/kg Subcutaneous Q24H  . feeding supplement  237 mL Oral TID BM  . fesoterodine  8 mg Oral Daily  . fluticasone  1 spray Each Nare  Daily  . hydrOXYzine  20 mg Oral QHS  . insulin aspart  0-9 Units Subcutaneous Q4H  . levothyroxine  50 mcg Oral Q0600  . megestrol  400 mg Oral Daily  . melatonin  3 mg Oral QHS  . mirabegron ER  25 mg Oral Daily  . mometasone-formoterol  2 puff Inhalation BID  . multivitamin with minerals  1 tablet Oral Daily  . senna-docusate  2 tablet Oral BID  . tamsulosin  0.4 mg Oral Daily  . tiotropium  18 mcg Inhalation Daily  . topiramate  50 mg Oral BID  . vitamin B-12  1,000 mcg Oral Daily   Continuous Infusions: . dextrose 5 % and 0.9% NaCl 100 mL/hr at 01/09/21 0446     LOS: 6 days    Time spent:40 min    Lilianne Delair, Geraldo Docker, MD [facility]   If 7PM-7AM, please contact night-coverage 01/09/2021, 8:22 AM

## 2021-01-09 NOTE — Progress Notes (Signed)
[place]  [phone] [phone]   Name: Eddie Carter Date: 01/09/2021 MRN: [id number]  DOB: 1941-01-21  Patient Care Team: [name], MD as PCP - General (Cardiology) Jessy Oto, MD as Consulting Physician (Orthopedic Surgery) Telford Nab, RN as Oncology Nurse Navigator    REASON FOR CONSULTATION: Eddie Carter is a 80 y.o. male with multiple medical problems including COPD, OSA on CPAP, ILD, history of CVA, IBS, urethral stricture DM, and mild dementia.  Patient was admitted to the hospital on 01/03/2021 with persistent abdominal pain.  CT of the abdomen/pelvis revealed a left lower lobe mass and diffuse hepatic metastases.  MRI of the brain revealed two punctate foci in the left cerebral hemisphere concerning for tiny metastases.  Palliative care was consulted to help address goals..    SOCIAL HISTORY:     reports that he quit smoking about 25 years ago. His smoking use included cigarettes. He has a 60.00 pack-year smoking history. He has never used smokeless tobacco. He reports that he does not drink alcohol and does not use drugs.  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  Patient lives at home alone.  He is in process of divorcing his wife. He has two sons.   ADVANCE DIRECTIVES:  So is reportedly his HCPOA  CODE STATUS: DNR  PAST MEDICAL HISTORY: Past Medical History:  Diagnosis Date  . Arthritis   . Asthma   . BPH (benign prostatic hyperplasia)   . 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 Complication of anesthesia    after septoplasty and uvulectomy-was in icu- [facility] - 10 yrs. ago  . COPD (chronic obstructive pulmonary disease) ([place])   . Depression   . Diabetes mellitus without complication ([place])   . Diverticulitis   . ED (erectile dysfunction)   . Full dentures   . GERD  (gastroesophageal reflux disease)    no meds  . Headache    difficulty with headaches- 1950's , again in 1967, 2008- again problems with migrraines   . History of GI bleed   . History of multiple pulmonary nodules   . History of shingles   . History of urethral stricture   . HOH (hard of hearing)   . HOH (hard of hearing)   . Hypogonadism in male   . IBS (irritable bowel syndrome)   . Joint pain   . Medial meniscus, posterior horn derangement    left knee  . Melanosis coli   . Obesity   . Pneumonia 2014   [facility]  . Shortness of breath   . Skin cancer   . Sleep apnea    uses a cpap, most nights   . Stroke [facility]) 1995   some speech and thought processes slow  . Traumatic tear of lateral meniscus of right knee    right knee  . Urinary frequency   . Urinary incontinence     PAST SURGICAL HISTORY:  Past Surgical History:  Procedure Laterality Date  . APPENDECTOMY  1963  . BACK SURGERY    . CARPAL TUNNEL RELEASE     left  . CATARACT EXTRACTION W/PHACO Left 09/13/2018   Procedure: CATARACT EXTRACTION PHACO AND INTRAOCULAR LENS PLACEMENT (IOC);  Surgeon: Birder Robson, MD;  Location: [facility] ORS;  Service: Ophthalmology;  Laterality: Left;  [place]  00:42  CDE 6.73 Fluid pack lot #@ [id number]  . CATARACT EXTRACTION W/PHACO Right 11/23/2019   Procedure: CATARACT EXTRACTION PHACO AND INTRAOCULAR LENS PLACEMENT ([place]) RIGHT;  Surgeon: Birder Robson, MD;  Location: [facility] ORS;  Service: Ophthalmology;  Laterality: Right;  [place] 01:10.1 CDE 10.25 Fluid Pack Lot # [id number]  . COLONOSCOPY    . COLONOSCOPY WITH PROPOFOL N/A 12/07/2016   Procedure: COLONOSCOPY WITH PROPOFOL;  Surgeon: Lollie Sails, MD;  Location: [facility] ENDOSCOPY;  Service: Endoscopy;  Laterality: N/A;  . COLONOSCOPY WITH PROPOFOL N/A 06/26/2019   Procedure: COLONOSCOPY WITH PROPOFOL;  Surgeon: Lollie Sails, MD;  Location: [facility] ENDOSCOPY;  Service: Endoscopy;  Laterality: N/A;  . EYE SURGERY     foreign body removed fr.  eye, ? side   . FOOT FOREIGN BODY REMOVAL  1976   left foot -multiple pieces glass  . HEMORROIDECTOMY  1995  . KNEE ARTHROSCOPY Bilateral 01/17/2013   Procedure: ARTHROSCOPY KNEE BILATERAL WITH MEDIAL AND LATERAL MENISECTOMIES;  Surgeon: Lorn Junes, MD;  Location: [place];  Service: Orthopedics;  Laterality: Bilateral;  . KYPHOPLASTY N/A 07/23/2017   Procedure: KYPHOPLASTY- L1;  Surgeon: Hessie Knows, MD;  Location: [facility] ORS;  Service: Orthopedics;  Laterality: N/A;  . LUMBAR LAMINECTOMY N/A 11/05/2014   Procedure: LEFT L3-4 MICRODISCECTOMY;  Surgeon: Jessy Oto, MD;  Location: [place];  Service: Orthopedics;  Laterality: N/A;  . LUMBAR LAMINECTOMY/DECOMPRESSION MICRODISCECTOMY N/A 02/12/2015   Procedure: RE-DO LEFT L3-4 MICRODISCECTOMY;  Surgeon: Jessy Oto, MD;  Location: [place];  Service: Orthopedics;  Laterality: N/A;  . NASAL SEPTUM SURGERY  1995  . TONSILLECTOMY    . UPPER GI ENDOSCOPY    . UVULOPALATOPHARYNGOPLASTY (UPPP)/TONSILLECTOMY/SEPTOPLASTY  1995   same time with septoplasty-ended up ICU almost trached    HEMATOLOGY/ONCOLOGY HISTORY:  Oncology History   No history exists.    ALLERGIES:  is allergic to asa [aspirin], bee venom, nsaids, and tolmetin.  MEDICATIONS:  Current Facility-Administered Medications  Medication Dose Route Frequency Provider Last Rate Last Admin  . albuterol (VENTOLIN HFA) 108 (90 Base) MCG/ACT inhaler 2 puff  2 puff Inhalation Q4H PRN Ivor Costa, MD      . cholecalciferol (VITAMIN D) tablet 1,000 Units  1,000 Units Oral Daily Ivor Costa, MD   1,000 Units at 01/09/21 0930  . dextromethorphan-guaiFENesin (MUCINEX DM) 30-600 MG per 12 hr tablet 1 tablet  1 tablet Oral BID PRN Ivor Costa, MD      . dextrose 5 %-0.9 % sodium chloride infusion   Intravenous Continuous Allie Bossier, MD 100 mL/hr at 01/09/21 0446 New Bag at 01/09/21 0446  . donepezil (ARICEPT) tablet 10 mg  10 mg Oral QHS Ivor Costa, MD   10 mg at 01/08/21 2142   . dutasteride (AVODART) capsule 0.5 mg  0.5 mg Oral Daily Ivor Costa, MD   0.5 mg at 01/09/21 0932  . enoxaparin (LOVENOX) injection 60 mg  0.5 mg/kg Subcutaneous Q24H Dorothe Pea, RPH   60 mg at 01/09/21 0934  . feeding supplement (ENSURE ENLIVE / ENSURE PLUS) liquid 237 mL  237 mL Oral TID BM Sharen Hones, MD   237 mL at 01/09/21 0934  . feeding supplement (ENSURE ENLIVE / ENSURE PLUS) liquid 237 mL  237 mL Oral TID BM Chace Bisch, Kirt Boys, NP      . fesoterodine (TOVIAZ) tablet 8 mg  8 mg Oral Daily Ivor Costa, MD   8 mg at 01/09/21 0932  . fluticasone (FLONASE) 50 MCG/ACT nasal  spray 1 spray  1 spray Each Nare Daily Ivor Costa, MD   1 spray at 01/09/21 5003  . hydrALAZINE (APRESOLINE) injection 5 mg  5 mg Intravenous Q2H PRN Ivor Costa, MD      . hydrOXYzine (ATARAX/VISTARIL) tablet 10 mg  10 mg Oral TID PRN Ivor Costa, MD      . hydrOXYzine (ATARAX/VISTARIL) tablet 20 mg  20 mg Oral QHS Ivor Costa, MD   20 mg at 01/08/21 2142  . hydrOXYzine (VISTARIL) injection 25 mg  25 mg Intramuscular Q6H PRN Ivor Costa, MD      . insulin aspart (novoLOG) injection 0-9 Units  0-9 Units Subcutaneous Q4H Allie Bossier, MD   1 Units at 01/09/21 1207  . levothyroxine (SYNTHROID) tablet 50 mcg  50 mcg Oral Q0600 Ivor Costa, MD   50 mcg at 01/09/21 [phone]  . megestrol (MEGACE) 400 MG/10ML suspension 400 mg  400 mg Oral Daily Sharen Hones, MD   400 mg at 01/09/21 0932  . melatonin tablet 3 mg  3 mg Oral QHS Ivor Costa, MD   3 mg at 01/08/21 2142  . mirabegron ER (MYRBETRIQ) tablet 25 mg  25 mg Oral Daily Ivor Costa, MD   25 mg at 01/09/21 0932  . mometasone-formoterol (DULERA) 200-5 MCG/ACT inhaler 2 puff  2 puff Inhalation BID Ivor Costa, MD   2 puff at 01/09/21 0935  . multivitamin with minerals tablet 1 tablet  1 tablet Oral Daily Sharen Hones, MD   1 tablet at 01/09/21 0931  . oxyCODONE (Oxy IR/ROXICODONE) immediate release tablet 5 mg  5 mg Oral Q12H PRN Allie Bossier, MD      . senna-docusate  (Senokot-S) tablet 2 tablet  2 tablet Oral BID Sharen Hones, MD   2 tablet at 01/09/21 0931  . sorbitol, milk of mag, mineral oil, glycerin (SMOG) enema  960 mL Rectal Once Allie Bossier, MD      . tamsulosin [facility]) capsule 0.4 mg  0.4 mg Oral Daily Ivor Costa, MD   0.4 mg at 01/09/21 0931  . tiotropium (SPIRIVA) inhalation capsule ([facility] use ONLY) 18 mcg  18 mcg Inhalation Daily Ivor Costa, MD   18 mcg at 01/09/21 0935  . topiramate (TOPAMAX) tablet 50 mg  50 mg Oral BID Ivor Costa, MD   50 mg at 01/09/21 0932  . vitamin B-12 (CYANOCOBALAMIN) tablet 1,000 mcg  1,000 mcg Oral Daily Ivor Costa, MD   1,000 mcg at 01/09/21 0930    VITAL SIGNS: BP 131/65 (BP Location: Right Arm)   Pulse 89   Temp 98.8 F (37.1 C)   Resp 20   Ht 6' (1.829 m)   Wt 260 lb (117.9 kg)   SpO2 96%   BMI 35.26 kg/m  Filed Weights   01/03/21 1104  Weight: 260 lb (117.9 kg)    Estimated body mass index is 35.26 kg/m as calculated from the following:   Height as of this encounter: 6' (1.829 m).   Weight as of this encounter: 260 lb (117.9 kg).  LABS: CBC:    Component Value Date/Time   WBC 9.7 01/09/2021 0714   HGB 12.5 (L) 01/09/2021 0714   HGB 17.5 10/28/2014 2234   HCT 39.2 01/09/2021 0714   HCT 44.1 01/16/2016 0843   PLT 164 01/09/2021 0714   PLT 189 10/28/2014 2234   MCV 94.2 01/09/2021 0714   MCV 94 10/28/2014 2234   NEUTROABS 7.0 01/09/2021 0714   NEUTROABS 13.2 (H) 07/28/2013 8891  LYMPHSABS 1.2 01/09/2021 0714   LYMPHSABS 1.6 07/28/2013 0643   MONOABS 1.1 (H) 01/09/2021 0714   MONOABS 0.8 07/28/2013 0643   EOSABS 0.2 01/09/2021 0714   EOSABS 0.0 07/28/2013 0643   BASOSABS 0.1 01/09/2021 0714   BASOSABS 0.0 07/28/2013 0643   Comprehensive Metabolic Panel:    Component Value Date/Time   NA 139 01/09/2021 0714   NA 138 10/28/2014 2234   K 3.8 01/09/2021 0714   K 3.9 10/28/2014 2234   CL 103 01/09/2021 0714   CL 100 10/28/2014 2234   CO2 25 01/09/2021 0714   CO2 28 10/28/2014  2234   BUN 19 01/09/2021 0714   BUN 22 (H) 10/28/2014 2234   CREATININE 0.93 01/09/2021 0714   CREATININE 1.09 06/05/2020 1210   GLUCOSE 117 (H) 01/09/2021 0714   GLUCOSE 126 (H) 10/28/2014 2234   CALCIUM 9.3 01/09/2021 0714   CALCIUM 8.9 10/28/2014 2234   AST 224 (H) 01/09/2021 0714   AST 37 10/28/2014 2234   ALT 73 (H) 01/09/2021 0714   ALT 69 (H) 10/28/2014 2234   ALKPHOS 491 (H) 01/09/2021 0714   ALKPHOS 69 10/28/2014 2234   BILITOT 2.5 (H) 01/09/2021 0714   BILITOT 0.3 01/16/2016 0843   BILITOT 0.7 10/28/2014 2234   PROT 6.2 (L) 01/09/2021 0714   PROT 6.4 01/16/2016 0843   PROT 7.2 10/28/2014 2234   ALBUMIN 2.5 (L) 01/09/2021 0714   ALBUMIN 3.9 01/16/2016 0843   ALBUMIN 3.8 10/28/2014 2234    RADIOGRAPHIC STUDIES: CT ANGIO CHEST PE W OR WO CONTRAST  Result Date: 01/04/2021 CLINICAL DATA:  Left lung mass noted on CT scan of the abdomen from yesterday. EXAM: CT ANGIOGRAPHY CHEST WITH CONTRAST TECHNIQUE: Multidetector CT imaging of the chest was performed using the standard protocol during bolus administration of intravenous contrast. Multiplanar CT image reconstructions and MIPs were obtained to evaluate the vascular anatomy. CONTRAST:  55mL OMNIPAQUE IOHEXOL 350 MG/ML SOLN COMPARISON:  CT abdomen/pelvis 01/03/2021. FINDINGS: Cardiovascular: High mild cardiac enlargement but no pericardial effusion. Moderate atherosclerotic calcifications involving the aorta no focal aneurysm or dissection. Scattered coronary artery calcifications. The pulmonary arteries are unremarkable. No findings for pulmonary embolism. Mediastinum/Nodes: Scattered borderline enlarged mediastinal and hilar lymph nodes. Prevascular lymph node on image number 33/4 measures 9.5 mm. Small AP window nodes are less than 6 mm. Left hilar node on image number 49/4 measures 8 mm. Left-sided subcarinal lymph node on image 58/4 measures 10.5 mm. The esophagus is grossly normal.  The thyroid gland is unremarkable.  Lungs/Pleura: As demonstrated on the recent abdominal CT scan there is a lobulated appearing mass in the left lower lobe. No air bronchograms to suggest this is an infiltrate. It measures approximately 6.5 x 5.2 cm and is worrisome for neoplasm. 10 mm nodule in the left upper lobe on image number 47/6. 8 mm subpleural nodule in the left lower lobe on image number 58/6. 5.5 mm right lower lobe pulmonary nodule on image number 50/6. Findings consistent with pulmonary metastatic disease. Underlying emphysematous changes and pulmonary scarring. Upper Abdomen: Innumerable hepatic metastatic lesions are noted. Musculoskeletal: No chest wall mass, supraclavicular or axillary lymphadenopathy the. The bony thorax is intact. No findings suspicious for osseous metastatic disease. Review of the MIP images confirms the above findings. IMPRESSION: 1. 6.5 x 5.2 cm left lower lobe pulmonary mass worrisome for neoplasm. There are borderline enlarged hilar and mediastinal lymph nodes suspicious for metastatic adenopathy. 2. Bilateral pulmonary nodules consistent with pulmonary metastatic disease. 3. Innumerable hepatic  metastatic lesions. 4. No findings suspicious for osseous metastatic disease. 5. PET-CT may be helpful for further evaluation and staging purposes. 6. Emphysema and aortic atherosclerosis. Aortic Atherosclerosis (ICD10-I70.0) and Emphysema (ICD10-J43.9). Electronically Signed   By: Marijo Sanes M.D.   On: 01/04/2021 15:33   MR BRAIN W WO CONTRAST  Result Date: 01/04/2021 CLINICAL DATA:  Non-small cell lung cancer.  Staging. EXAM: MRI HEAD WITHOUT AND WITH CONTRAST TECHNIQUE: Multiplanar, multiecho pulse sequences of the brain and surrounding structures were obtained without and with intravenous contrast. CONTRAST:  32mL GADAVIST GADOBUTROL 1 MMOL/ML IV SOLN COMPARISON:  Head CT 07/13/2017 FINDINGS: Brain: Axial and coronal postcontrast imaging are markedly degraded by patient motion. No focal abnormality affects  the brainstem or cerebellum. Within the cerebral hemispheres, there is a punctate focus of restricted diffusion adjacent to the posterior body of the left lateral ventricle image 79. There is a punctate focus of restricted diffusion in a left parietal vertex gyrus, image 88. The diffusion findings are suspicious for tiny metastases, but that cannot be confirmed because of the motion degradation. Consider repeat axial and coronal imaging when the patient is able to tolerate the procedure better. Otherwise, there are mild chronic small-vessel ischemic changes of the hemispheric white matter. No cortical or large vessel territory infarction. No sign of hemorrhage, hydrocephalus or extra-axial collection. Vascular: Major vessels at the base of the brain show flow. Skull and upper cervical spine: Negative Sinuses/Orbits: Clear/normal Other: None IMPRESSION: 1. Markedly motion degraded exam. Axial and coronal postcontrast imaging was markedly degraded and essentially nondiagnostic. 2. Two punctate foci of restricted diffusion in the left cerebral hemisphere, one adjacent to the posterior body of the left lateral ventricle and the other in a left parietal vertex gyrus. The diffusion findings are suspicious for tiny metastases, but that cannot be confirmed because of the motion degradation. Consider repeat axial and coronal postcontrast imaging when the patient is able to tolerate the procedure. Electronically Signed   By: Nelson Chimes M.D.   On: 01/04/2021 19:26   CT ABDOMEN PELVIS W CONTRAST  Result Date: 01/03/2021 CLINICAL DATA:  Right lower quadrant abdominal pain. EXAM: CT ABDOMEN AND PELVIS WITH CONTRAST TECHNIQUE: Multidetector CT imaging of the abdomen and pelvis was performed using the standard protocol following bolus administration of intravenous contrast. CONTRAST:  144mL OMNIPAQUE IOHEXOL 300 MG/ML  SOLN COMPARISON:  October 29, 2014. FINDINGS: Lower chest: 5.6 x 4.4 cm lobulated mass is noted in the left  lower lobe laterally concerning for malignancy. Hepatobiliary: No gallstones or biliary dilatation is noted. Multiple rounded low densities are noted throughout the liver of varying sizes consistent with diffuse hepatic metastases. The largest measures 7.1 x 4.3 cm in the left hepatic lobe. Pancreas: Unremarkable. No pancreatic ductal dilatation or surrounding inflammatory changes. Spleen: Normal in size without focal abnormality. Adrenals/Urinary Tract: Adrenal glands are unremarkable. Kidneys are normal, without renal calculi, focal lesion, or hydronephrosis. Bladder is unremarkable. Stomach/Bowel: Stomach appears normal. Status post appendectomy. There is no evidence of bowel obstruction or inflammation. Vascular/Lymphatic: Atherosclerosis of abdominal aorta is noted without aneurysm or dissection. 2.4 cm left periaortic lymph node is noted concerning for metastatic disease. Reproductive: Prostate is unremarkable. Other: No abdominal wall hernia or abnormality. No abdominopelvic ascites. Musculoskeletal: No acute or significant osseous findings. 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 IMPRESSION: 1. 5.6 x 4.4 cm lobulated mass is noted in  the left lower lobe laterally concerning for malignancy. 2. Multiple rounded low densities are noted throughout the liver of varying sizes consistent with diffuse hepatic metastases. The largest measures 7.1 x 4.3 cm in the left hepatic lobe. 3. 2.4 cm left periaortic lymph node is noted concerning for metastatic disease. 4. Aortic atherosclerosis. Aortic Atherosclerosis (ICD10-I70.0). Electronically Signed   By: Marijo Conception M.D.   On: 01/03/2021 13:51   US BIOPSY (LIVER)  Result Date: 01/06/2021 INDICATION: Lung and multiple liver lesions suspicious for metastatic disease EXAM: Ultrasound-guided biopsy of liver lesions MEDICATIONS: None.  ANESTHESIA/SEDATION: None COMPLICATIONS: None immediate. PROCEDURE: Informed written consent was obtained from the patient after a thorough discussion of the procedural risks, benefits and alternatives. All questions were addressed. Maximal Sterile Barrier Technique was utilized including caps, mask, sterile gowns, sterile gloves, sterile drape, hand hygiene and skin antiseptic. A timeout was performed prior to the initiation of the procedure. Patient position supine on the ultrasound table. Epigastric skin prepped and draped in usual sterile fashion. Following local lidocaine administration, 17 gauge introducer needle was advanced into 1 of the left hepatic lobe lesions, and four 18 gauge cores were obtained utilizing continuous ultrasound guidance. Gelfoam slurry was administered through the introducer needle at the biopsy site. Samples were sent to pathology in formalin. Needle removed and hemostasis achieved with 5 minutes of manual compression. Post procedure ultrasound images showed no evidence of significant hemorrhage. IMPRESSION: Ultrasound-guided biopsy of left liver lesion as above. Electronically Signed   By: Miachel Roux M.D.   On: 01/06/2021 13:46   ECHOCARDIOGRAM COMPLETE  Result Date: 01/08/2021    ECHOCARDIOGRAM REPORT   Patient Name:   CHARLTON BOULE Date of Exam: 01/08/2021 Medical Rec #:  [id number]      Height:       72.0 in Accession #:    [id number]     Weight:       260.0 lb Date of Birth:  1941/04/20       BSA:          2.382 m Patient Age:    55 years       BP:           123/58 mmHg Patient Gender: M              HR:           91 bpm. Exam Location:  [facility] Procedure: 2D Echo, Color Doppler and Cardiac Doppler Indications:     CHF-acute systolic V76.16  History:         Patient has no prior history of Echocardiogram examinations.                  Stroke, Signs/Symptoms:Shortness of Breath; Risk Factors:Sleep                  Apnea.  Sonographer:     Sherrie Sport RDCS (AE) Referring Phys:   [id number] Geraldo Docker WOODS Diagnosing Phys: Kate Sable MD  Sonographer Comments: No apical window, no subcostal window and Technically challenging study due to limited acoustic windows. IMPRESSIONS  1. Left ventricular ejection fraction, by estimation, is 60 to 65%. The left ventricle has normal function. The left ventricle has no regional wall motion abnormalities. There is mild left ventricular hypertrophy. Left ventricular diastolic function could not be evaluated.  2. Right ventricular systolic function is normal. The right ventricular size is not well visualized.  3. The mitral valve is degenerative. No evidence of mitral valve  regurgitation. Moderate mitral annular calcification.  4. The aortic valve is normal in structure. Aortic valve regurgitation is not visualized.  5. Aortic dilatation noted. There is mild dilatation of the aortic root, measuring 39 mm. 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 FINDINGS  Left Ventricle: Left ventricular ejection fraction, by estimation, is 60 to 65%. The left ventricle has normal function. The left ventricle has no regional wall motion abnormalities. The left ventricular internal cavity size was normal in size. There is  mild left ventricular hypertrophy. Left ventricular diastolic function could not be evaluated. Right Ventricle: The right ventricular size is not well visualized. No increase in right ventricular wall thickness. Right ventricular systolic function is normal. Left Atrium: Left atrial size was normal in size. Right Atrium: Right atrial size was not well visualized. Pericardium: There is no evidence of pericardial effusion. Mitral Valve: The mitral valve is degenerative in appearance. Moderate mitral annular calcification. No evidence of mitral valve regurgitation. Tricuspid Valve: The tricuspid valve is normal in structure. Tricuspid valve regurgitation is not demonstrated. Aortic Valve: The aortic valve is normal in  structure. Aortic valve regurgitation is not visualized. Pulmonic Valve: The pulmonic valve was not well visualized. Pulmonic valve regurgitation is not visualized. Aorta: Aortic dilatation noted. There is mild dilatation of the aortic root, measuring 39 mm. 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 Venous: The inferior vena cava was not well visualized. IAS/Shunts: The interatrial septum was not well visualized.  LEFT VENTRICLE PLAX 2D LVIDd:         4.54 cm LVIDs:         2.62 cm LV PW:         1.45 cm LV IVS:        1.58 cm LVOT diam:     2.30 cm LVOT Area:     4.15 cm  LEFT ATRIUM         Index LA diam:    2.80 cm 1.18 cm/m                        PULMONIC VALVE AORTA                 PV Vmax:        0.48 m/s Ao Root diam: 4.13 cm PV Peak grad:   0.9 mmHg                       RVOT Peak grad: 2 mmHg   SHUNTS Systemic Diam: 2.30 cm Kate Sable MD Electronically signed by Kate Sable MD Signature Date/Time: 01/08/2021/3:58:28 PM    Final     PERFORMANCE STATUS (ECOG) : 4 - Bedbound  Review of Systems Unable to complete  Physical Exam General: NAD, frail appearing Pulmonary: Unlabored Extremities: no edema, no joint deformities Skin: no rashes Neurological: Weakness, lethargic  IMPRESSION: Patient remains lethargic and delirious.  Wakes briefly to verbal stimulation and then falls asleep after 1 or 2 words.  Oral intake is limited by patient's persistent lethargy.  Noted rising lactic acid.  Patient's not had oxycodone or other sedating meds in over 24 hours.  I called and spoke with patient's son.  He reports having had a clinical update earlier from the Dr. Sherral Hammers.  Son recognizes that patient is not improving.  He verbalized understanding that patient may further decline.  He asked for me to prognosticate.  Patient  has little appreciable oral intake given his persistent lethargy.  Unless significant clinical improvement is seen, I would likely  estimate life expectancy on the order of days to weeks.  Even with clinical improvement, patient's poor performance status might ultimately limit viable treatment options for his cancer.    Son says that he is interested in awaiting final pathology and would like to support patient for a few days with IV fluids and oral nutritional supplements.  If no clinical improvement is seen, son says that he might be interested in pursuing hospice care.  Patient is not rehab appropriate currently.  We did discuss the option of hospice facility in the event that family decides to comfort/end-of-life care.  PLAN: -Continue current scope of treatment -Recommend maintenance IV fluids -Family considering disposition  Case and plan discussed with Dr. Janese Banks and Dr. Sherral Hammers  Time Total: 25 minutes  Visit consisted of counseling and education dealing with the complex and emotionally intense issues of symptom management and palliative care in the setting of serious and potentially life-threatening illness.Greater than 50%  of this time was spent counseling and coordinating care related to the above assessment and plan.  Signed by: Altha Harm, PhD, NP-C

## 2021-01-09 NOTE — Progress Notes (Signed)
Update given to son

## 2021-01-09 NOTE — Consult Note (Signed)
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 male patient admitted with patient not able to communicate or not willing to communicate at this time.  Grunted when I ask him how he was doing.  HPI: Came to see patient at suppertime tonight.  He was sitting up in bed in front of his food tray but making no effort to eat or drink anything.  When I spoke to him he made a couple of grunting sounds but did not verbalize anything intelligible.  Did not respond to questions about whether anything could be done that would be helpful for him.  Seemed to respond positively at 1 point questions about pain but then on follow-up did not confirm that.  Surgical pathology came back for the liver biopsy suggesting a tumor of neuroendocrine origin which could be potentially consistent with a paraneoplastic syndrome that could cause cognitive symptoms.  Palliative care came to see the patient today and elicited no more response apparently than I did.  Past Psychiatric History: No past psychiatric documentation  Risk to Self:   Risk to Others:   Prior Inpatient Therapy:   Prior  Outpatient Therapy:    Past Medical History:  Past Medical History:  Diagnosis Date  . Arthritis   . Asthma   . BPH (benign prostatic hyperplasia)   . 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 Complication of anesthesia    after septoplasty and uvulectomy-was in icu- [facility] - 10 yrs. ago  . COPD (chronic obstructive pulmonary disease) ([place])   . Depression   . Diabetes mellitus without complication ([place])   . Diverticulitis   . ED (erectile dysfunction)   . Full dentures   . GERD (gastroesophageal reflux disease)    no meds  . Headache    difficulty with headaches- 1950's , again in 1967, 2008- again problems with migrraines   . History of GI bleed   . History of multiple pulmonary nodules   . History of shingles   . History of urethral stricture   . HOH (hard of hearing)   . HOH (hard of hearing)   . Hypogonadism in male   . IBS (irritable bowel syndrome)   . Joint pain   . Medial meniscus, posterior horn derangement    left knee  . Melanosis coli   . Obesity   . Pneumonia 2014   [facility]  . Shortness of breath   . Skin cancer   . Sleep apnea    uses a cpap, most nights   . Stroke [facility]) 1995   some speech and thought processes slow  . Traumatic tear of  lateral meniscus of right knee    right knee  . Urinary frequency   . Urinary incontinence     Past Surgical History:  Procedure Laterality Date  . APPENDECTOMY  1963  . BACK SURGERY    . CARPAL TUNNEL RELEASE     left  . CATARACT EXTRACTION W/PHACO Left 09/13/2018   Procedure: CATARACT EXTRACTION PHACO AND INTRAOCULAR LENS PLACEMENT (IOC);  Surgeon: Birder Robson, MD;  Location: [facility] ORS;  Service: Ophthalmology;  Laterality: Left;  [place]  00:42 CDE 6.73 Fluid pack lot #@ [id number]  . CATARACT EXTRACTION W/PHACO Right 11/23/2019   Procedure: CATARACT EXTRACTION PHACO AND INTRAOCULAR LENS PLACEMENT ([place]) RIGHT;  Surgeon: Birder Robson, MD;  Location: [facility] ORS;  Service: Ophthalmology;  Laterality: Right;   [place] 01:10.1 CDE 10.25 Fluid Pack Lot # [id number]  . COLONOSCOPY    . COLONOSCOPY WITH PROPOFOL N/A 12/07/2016   Procedure: COLONOSCOPY WITH PROPOFOL;  Surgeon: Lollie Sails, MD;  Location: [facility] ENDOSCOPY;  Service: Endoscopy;  Laterality: N/A;  . COLONOSCOPY WITH PROPOFOL N/A 06/26/2019   Procedure: COLONOSCOPY WITH PROPOFOL;  Surgeon: Lollie Sails, MD;  Location: [facility] ENDOSCOPY;  Service: Endoscopy;  Laterality: N/A;  . EYE SURGERY     foreign body removed fr. eye, ? side   . FOOT FOREIGN BODY REMOVAL  1976   left foot -multiple pieces glass  . HEMORROIDECTOMY  1995  . KNEE ARTHROSCOPY Bilateral 01/17/2013   Procedure: ARTHROSCOPY KNEE BILATERAL WITH MEDIAL AND LATERAL MENISECTOMIES;  Surgeon: Lorn Junes, MD;  Location: [place];  Service: Orthopedics;  Laterality: Bilateral;  . KYPHOPLASTY N/A 07/23/2017   Procedure: KYPHOPLASTY- L1;  Surgeon: Hessie Knows, MD;  Location: [facility] ORS;  Service: Orthopedics;  Laterality: N/A;  . LUMBAR LAMINECTOMY N/A 11/05/2014   Procedure: LEFT L3-4 MICRODISCECTOMY;  Surgeon: Jessy Oto, MD;  Location: [place];  Service: Orthopedics;  Laterality: N/A;  . LUMBAR LAMINECTOMY/DECOMPRESSION MICRODISCECTOMY N/A 02/12/2015   Procedure: RE-DO LEFT L3-4 MICRODISCECTOMY;  Surgeon: Jessy Oto, MD;  Location: [place];  Service: Orthopedics;  Laterality: N/A;  . NASAL SEPTUM SURGERY  1995  . TONSILLECTOMY    . UPPER GI ENDOSCOPY    . UVULOPALATOPHARYNGOPLASTY (UPPP)/TONSILLECTOMY/SEPTOPLASTY  1995   same time with septoplasty-ended up ICU almost trached   Family History:  Family History  Problem Relation Age of Onset  . Cancer Mother   . Heart attack Father   . Heart disease Father   . Heart disease Other   . Arthritis Other   . Prostate cancer Neg Hx   . Kidney disease Neg Hx    Family Psychiatric  History: See previous Social History:  Social History   Substance and Sexual Activity  Alcohol Use No   Comment: wine in a  month     Social History   Substance and Sexual Activity  Drug Use No    Social History   Socioeconomic History  . Marital status: Married    Spouse name: Mardene Celeste  . Number of children: 2  . Years of education: 17  . Highest education level: Not on file  Occupational History    Comment: register of deeds  Tobacco Use  . Smoking status: Former Smoker    Packs/day: 1.50    Years: 40.00    Pack years: 60.00    Types: Cigarettes    Quit date: 01/14/1996    Years since quitting: 25.0  . Smokeless tobacco: Never Used  Vaping Use  . Vaping Use:  Never used  Substance and Sexual Activity  . Alcohol use: No    Comment: wine in a month  . Drug use: No  . Sexual activity: Never  Other Topics Concern  . Not on file  Social History Narrative   Lives at home with wife, son   caffeine use - 2 cups coffee daily, sodas 1 a day, occas tea   Social Determinants of Health   Financial Resource Strain: Not on file  Food Insecurity: Not on file  Transportation Needs: Not on file  Physical Activity: Not on file  Stress: Not on file  Social Connections: Not on file   Additional Social History:    Allergies:   Allergies  Allergen Reactions  . Asa [Aspirin] Shortness Of Breath  . Bee Venom Anaphylaxis  . Nsaids Other (See Comments)    Makes him bleed.  . Tolmetin Other (See Comments)    Makes him bleed.    Labs:  Results for orders placed or performed during the hospital encounter of 01/03/21 (from the past 48 hour(s))  CBC with Differential/Platelet     Status: Abnormal   Collection Time: 01/08/21  5:00 AM  Result Value Ref Range   WBC 9.9 4.0 - 10.5 K/uL   RBC 4.26 4.22 - 5.81 MIL/uL   Hemoglobin 12.7 (L) 13.0 - 17.0 g/dL   HCT 39.8 39.0 - 52.0 %   MCV 93.4 80.0 - 100.0 fL   MCH 29.8 26.0 - 34.0 pg   MCHC 31.9 30.0 - 36.0 g/dL   RDW 18.5 (H) 11.5 - 15.5 %   Platelets 166 150 - 400 K/uL   nRBC 0.0 0.0 - 0.2 %   Neutrophils Relative % 73 %   Neutro Abs 7.4 1.7 - 7.7  K/uL   Lymphocytes Relative 12 %   Lymphs Abs 1.1 0.7 - 4.0 K/uL   Monocytes Relative 12 %   Monocytes Absolute 1.2 (H) 0.1 - 1.0 K/uL   Eosinophils Relative 1 %   Eosinophils Absolute 0.1 0.0 - 0.5 K/uL   Basophils Relative 1 %   Basophils Absolute 0.1 0.0 - 0.1 K/uL   Immature Granulocytes 1 %   Abs Immature Granulocytes 0.07 0.00 - 0.07 K/uL    Comment: Performed at [facility], [address] [place], [place] [address]  Basic metabolic panel     Status: Abnormal   Collection Time: 01/08/21  5:00 AM  Result Value Ref Range   Sodium 139 135 - 145 mmol/L   Potassium 4.0 3.5 - 5.1 mmol/L   Chloride 101 98 - 111 mmol/L   CO2 25 22 - 32 mmol/L   Glucose, Bld 109 (H) 70 - 99 mg/dL    Comment: Glucose reference range applies only to samples taken after fasting for at least 8 hours.   BUN 21 8 - 23 mg/dL   Creatinine, Ser 1.09 0.61 - 1.24 mg/dL   Calcium 9.1 8.9 - 10.3 mg/dL   GFR, Estimated >60 >60 mL/min    Comment: (NOTE) Calculated using the CKD-EPI Creatinine Equation (2021)    Anion gap 13 5 - 15    Comment: Performed at [facility], [address] [place], [place] [address]  Magnesium     Status: Abnormal   Collection Time: 01/08/21  5:00 AM  Result Value Ref Range   Magnesium 2.5 (H) 1.7 - 2.4 mg/dL    Comment: Performed at [facility], [address], [place],  [address]  Glucose, capillary  Status: None   Collection Time: 01/08/21  7:59 AM  Result Value Ref Range   Glucose-Capillary 98 70 - 99 mg/dL    Comment: Glucose reference range applies only to samples taken after fasting for at least 8 hours.  Lactic acid, plasma     Status: Abnormal   Collection Time: 01/08/21  9:22 AM  Result Value Ref Range   Lactic Acid, Venous 3.7 (HH) 0.5 - 1.9 mmol/L    Comment: CRITICAL RESULT CALLED TO, READ BACK BY AND VERIFIED WITH  ASHLEY RAMIREZ AT 1003 01/08/21 SDR Performed at [facility], [address] [place],  [place] [address]   Glucose, capillary     Status: Abnormal   Collection Time: 01/08/21 11:43 AM  Result Value Ref Range   Glucose-Capillary 135 (H) 70 - 99 mg/dL    Comment: Glucose reference range applies only to samples taken after fasting for at least 8 hours.  Lactic acid, plasma     Status: Abnormal   Collection Time: 01/08/21 12:14 PM  Result Value Ref Range   Lactic Acid, Venous 3.8 (HH) 0.5 - 1.9 mmol/L    Comment: CRITICAL VALUE NOTED. VALUE IS CONSISTENT WITH PREVIOUSLY REPORTED/CALLED VALUE KLW Performed at [facility], [address] [place], [place] [address]   Glucose, capillary     Status: Abnormal   Collection Time: 01/08/21  3:52 PM  Result Value Ref Range   Glucose-Capillary 158 (H) 70 - 99 mg/dL    Comment: Glucose reference range applies only to samples taken after fasting for at least 8 hours.  Glucose, capillary     Status: Abnormal   Collection Time: 01/08/21  9:15 PM  Result Value Ref Range   Glucose-Capillary 166 (H) 70 - 99 mg/dL    Comment: Glucose reference range applies only to samples taken after fasting for at least 8 hours.  Glucose, capillary     Status: Abnormal   Collection Time: 01/08/21 11:52 PM  Result Value Ref Range   Glucose-Capillary 146 (H) 70 - 99 mg/dL    Comment: Glucose reference range applies only to samples taken after fasting for at least 8 hours.  Glucose, capillary     Status: Abnormal   Collection Time: 01/09/21  4:08 AM  Result Value Ref Range   Glucose-Capillary 103 (H) 70 - 99 mg/dL    Comment: Glucose reference range applies only to samples taken after fasting for at least 8 hours.  Comprehensive metabolic panel     Status: Abnormal   Collection Time: 01/09/21  7:14 AM  Result Value Ref Range   Sodium 139 135 - 145 mmol/L   Potassium 3.8 3.5 - 5.1 mmol/L   Chloride 103 98 - 111 mmol/L   CO2 25 22 - 32 mmol/L   Glucose, Bld 117 (H) 70 - 99 mg/dL    Comment: Glucose reference range applies only to samples taken after  fasting for at least 8 hours.   BUN 19 8 - 23 mg/dL   Creatinine, Ser 0.93 0.61 - 1.24 mg/dL   Calcium 9.3 8.9 - 10.3 mg/dL   Total Protein 6.2 (L) 6.5 - 8.1 g/dL   Albumin 2.5 (L) 3.5 - 5.0 g/dL   AST 224 (H) 15 - 41 U/L   ALT 73 (H) 0 - 44 U/L   Alkaline Phosphatase 491 (H) 38 - 126 U/L   Total Bilirubin 2.5 (H) 0.3 - 1.2 mg/dL   GFR, Estimated >60 >60 mL/min    Comment: (NOTE) Calculated  using the CKD-EPI Creatinine Equation (2021)    Anion gap 11 5 - 15    Comment: Performed at [facility], [address] [place], [place] [address]  Magnesium     Status: None   Collection Time: 01/09/21  7:14 AM  Result Value Ref Range   Magnesium 2.4 1.7 - 2.4 mg/dL    Comment: Performed at [facility], [address] [place], [place] [address]  Phosphorus     Status: None   Collection Time: 01/09/21  7:14 AM  Result Value Ref Range   Phosphorus 2.9 2.5 - 4.6 mg/dL    Comment: Performed at [facility], [address] [place], [place] [address]  CBC with Differential/Platelet     Status: Abnormal   Collection Time: 01/09/21  7:14 AM  Result Value Ref Range   WBC 9.7 4.0 - 10.5 K/uL   RBC 4.16 (L) 4.22 - 5.81 MIL/uL   Hemoglobin 12.5 (L) 13.0 - 17.0 g/dL   HCT 39.2 39.0 - 52.0 %   MCV 94.2 80.0 - 100.0 fL   MCH 30.0 26.0 - 34.0 pg   MCHC 31.9 30.0 - 36.0 g/dL   RDW 18.6 (H) 11.5 - 15.5 %   Platelets 164 150 - 400 K/uL   nRBC 0.0 0.0 - 0.2 %   Neutrophils Relative % 72 %   Neutro Abs 7.0 1.7 - 7.7 K/uL   Lymphocytes Relative 13 %   Lymphs Abs 1.2 0.7 - 4.0 K/uL   Monocytes Relative 11 %   Monocytes Absolute 1.1 (H) 0.1 - 1.0 K/uL   Eosinophils Relative 2 %   Eosinophils Absolute 0.2 0.0 - 0.5 K/uL   Basophils Relative 1 %   Basophils Absolute 0.1 0.0 - 0.1 K/uL   Immature Granulocytes 1 %   Abs Immature Granulocytes 0.11 (H) 0.00 - 0.07 K/uL    Comment: Performed at [facility], [address] [place], [place] [address]  Glucose,  capillary     Status: Abnormal   Collection Time: 01/09/21  7:52 AM  Result Value Ref Range   Glucose-Capillary 101 (H) 70 - 99 mg/dL    Comment: Glucose reference range applies only to samples taken after fasting for at least 8 hours.  Lactic acid, plasma     Status: Abnormal   Collection Time: 01/09/21  8:48 AM  Result Value Ref Range   Lactic Acid, Venous 4.1 (HH) 0.5 - 1.9 mmol/L    Comment: CRITICAL VALUE NOTED. VALUE IS CONSISTENT WITH PREVIOUSLY REPORTED/CALLED VALUE/HKP Performed at [facility], [place], [place] [address]   Lactic acid, plasma     Status: Abnormal   Collection Time: 01/09/21 11:16 AM  Result Value Ref Range   Lactic Acid, Venous 4.2 (HH) 0.5 - 1.9 mmol/L    Comment: CRITICAL VALUE NOTED. VALUE IS CONSISTENT WITH PREVIOUSLY REPORTED/CALLED VALUE/HKP Performed at [facility], [address] [place], [place] [address]   Glucose, capillary     Status: Abnormal   Collection Time: 01/09/21 11:32 AM  Result Value Ref Range   Glucose-Capillary 121 (H) 70 - 99 mg/dL    Comment: Glucose reference range applies only to samples taken after fasting for at least 8 hours.  Ammonia     Status: Abnormal   Collection Time: 01/09/21  1:22 PM  Result Value Ref Range   Ammonia 42 (H) 9 - 35 umol/L    Comment: Performed at [facility], [address] [place], [place] [address]  Glucose, capillary     Status: Abnormal   Collection Time: 01/09/21  5:01 PM  Result Value Ref Range   Glucose-Capillary 132 (H) 70 - 99 mg/dL    Comment: Glucose reference range applies only to samples taken after fasting for at least 8 hours.    Current Facility-Administered Medications  Medication Dose Route Frequency Provider Last Rate Last Admin  . albuterol (VENTOLIN HFA) 108 (90 Base) MCG/ACT inhaler 2 puff  2 puff Inhalation Q4H PRN Ivor Costa, MD      . cholecalciferol (VITAMIN D) tablet 1,000 Units  1,000 Units Oral Daily Ivor Costa, MD    1,000 Units at 01/09/21 0930  . dextromethorphan-guaiFENesin (MUCINEX DM) 30-600 MG per 12 hr tablet 1 tablet  1 tablet Oral BID PRN Ivor Costa, MD      . dextrose 5 %-0.9 % sodium chloride infusion   Intravenous Continuous Allie Bossier, MD 100 mL/hr at 01/09/21 1512 Infusion Verify at 01/09/21 1512  . donepezil (ARICEPT) tablet 10 mg  10 mg Oral QHS Ivor Costa, MD   10 mg at 01/08/21 2142  . dutasteride (AVODART) capsule 0.5 mg  0.5 mg Oral Daily Ivor Costa, MD   0.5 mg at 01/09/21 0932  . enoxaparin (LOVENOX) injection 60 mg  0.5 mg/kg Subcutaneous Q24H Dorothe Pea, RPH   60 mg at 01/09/21 0934  . feeding supplement (ENSURE ENLIVE / ENSURE PLUS) liquid 237 mL  237 mL Oral TID BM Sharen Hones, MD   237 mL at 01/09/21 1436  . feeding supplement (ENSURE ENLIVE / ENSURE PLUS) liquid 237 mL  237 mL Oral TID BM Borders, Kirt Boys, NP      . fluticasone (FLONASE) 50 MCG/ACT nasal spray 1 spray  1 spray Each Nare Daily Ivor Costa, MD   1 spray at 01/09/21 0934  . hydrALAZINE (APRESOLINE) injection 5 mg  5 mg Intravenous Q2H PRN Ivor Costa, MD      . hydrOXYzine (ATARAX/VISTARIL) tablet 10 mg  10 mg Oral TID PRN Ivor Costa, MD      . hydrOXYzine (ATARAX/VISTARIL) tablet 20 mg  20 mg Oral QHS Ivor Costa, MD   20 mg at 01/08/21 2142  . hydrOXYzine (VISTARIL) injection 25 mg  25 mg Intramuscular Q6H PRN Ivor Costa, MD      . insulin aspart (novoLOG) injection 0-9 Units  0-9 Units Subcutaneous Q4H Allie Bossier, MD   1 Units at 01/09/21 1729  . levothyroxine (SYNTHROID) tablet 50 mcg  50 mcg Oral Q0600 Ivor Costa, MD   50 mcg at 01/09/21 [phone]  . megestrol (MEGACE) 400 MG/10ML suspension 400 mg  400 mg Oral Daily Sharen Hones, MD   400 mg at 01/09/21 0932  . melatonin tablet 3 mg  3 mg Oral QHS Ivor Costa, MD   3 mg at 01/08/21 2142  . mometasone-formoterol (DULERA) 200-5 MCG/ACT inhaler 2 puff  2 puff Inhalation BID Ivor Costa, MD   2 puff at 01/09/21 0935  . multivitamin with minerals tablet 1  tablet  1 tablet Oral Daily Sharen Hones, MD   1 tablet at 01/09/21 0931  . oxyCODONE (Oxy IR/ROXICODONE) immediate release tablet 5 mg  5 mg Oral Q12H PRN Allie Bossier, MD      . senna-docusate (Senokot-S) tablet 2 tablet  2 tablet Oral BID Sharen Hones, MD   2 tablet at 01/09/21 0931  . tamsulosin (FLOMAX) capsule 0.4 mg  0.4 mg Oral Daily Ivor Costa,  MD   0.4 mg at 01/09/21 0931  . tiotropium (SPIRIVA) inhalation capsule ([facility] use ONLY) 18 mcg  18 mcg Inhalation Daily Ivor Costa, MD   18 mcg at 01/09/21 0935  . topiramate (TOPAMAX) tablet 50 mg  50 mg Oral BID Ivor Costa, MD   50 mg at 01/09/21 0932  . vitamin B-12 (CYANOCOBALAMIN) tablet 1,000 mcg  1,000 mcg Oral Daily Ivor Costa, MD   1,000 mcg at 01/09/21 0930    Musculoskeletal: Strength & Muscle Tone: decreased Gait & Station: unable to stand Patient leans: N/A  Psychiatric Specialty Exam: Physical Exam Vitals and nursing note reviewed.  Constitutional:      Appearance: He is well-developed and well-nourished.  HENT:     Head: Normocephalic and atraumatic.  Eyes:     Conjunctiva/sclera: Conjunctivae normal.     Pupils: Pupils are equal, round, and reactive to light.  Cardiovascular:     Heart sounds: Normal heart sounds.  Pulmonary:     Effort: Pulmonary effort is normal.  Abdominal:     Palpations: Abdomen is soft.  Musculoskeletal:        General: Normal range of motion.     Cervical back: Normal range of motion.  Skin:    General: Skin is warm and dry.  Neurological:     General: No focal deficit present.     Mental Status: He is alert.  Psychiatric:        Attention and Perception: He is inattentive.        Mood and Affect: Affect is blunt.     Review of Systems  Unable to perform ROS: Mental status change    Blood pressure 119/66, pulse 90, temperature 98.9 F (37.2 C), temperature source Oral, resp. rate 20, height 6' (1.829 m), weight 117.9 kg, SpO2 97 %.Body mass index is 35.26 kg/m.  General  Appearance: Casual  Eye Contact:  None  Speech:  Negative  Volume:  Decreased  Mood:  Negative  Affect:  Constricted  Thought Process:  NA  Orientation:  Negative  Thought Content:  Negative  Suicidal Thoughts:  Patient had previously made it clear that he was wishing that he could die.  Did not belabor the point by asking again today as he has not changed any of his behavior  Homicidal Thoughts:  No  Memory:  Negative  Judgement:  Negative  Insight:  Negative  Psychomotor Activity:  Negative  Concentration:  Concentration: Negative  Recall:  Negative  Fund of Knowledge:  Negative  Language:  Negative  Akathisia:  Negative  Handed:  Right  AIMS (if indicated):     Assets:  Social Support  ADL's:  Impaired  Cognition:  Impaired,  Moderate and Severe  Sleep:        Treatment Plan Summary: Plan Not much change from yesterday.  Fortunately also not agitated or aggressive.  Little response but does not obviously appear to be suffering.  Not doing anything active to try to harm himself.  Palliative care offered the opinion that given the current situation life expectancy, while unpredictable, was probably fairly low.  No indication at this point for any addition of any psychiatric medicine as there is no specific symptoms to intervene with.  Disposition: Patient does not meet criteria for psychiatric inpatient admission.  Alethia Berthold, MD 01/09/2021 6:07 PM

## 2021-01-10 DIAGNOSIS — C7A8 Other malignant neuroendocrine tumors: Secondary | ICD-10-CM | POA: Diagnosis not present

## 2021-01-10 DIAGNOSIS — R410 Disorientation, unspecified: Secondary | ICD-10-CM | POA: Diagnosis not present

## 2021-01-10 DIAGNOSIS — Z515 Encounter for palliative care: Secondary | ICD-10-CM | POA: Diagnosis not present

## 2021-01-10 DIAGNOSIS — C7B8 Other secondary neuroendocrine tumors: Secondary | ICD-10-CM | POA: Diagnosis not present

## 2021-01-10 DIAGNOSIS — R630 Anorexia: Secondary | ICD-10-CM | POA: Diagnosis not present

## 2021-01-10 DIAGNOSIS — Z7189 Other specified counseling: Secondary | ICD-10-CM

## 2021-01-10 DIAGNOSIS — J431 Panlobular emphysema: Secondary | ICD-10-CM | POA: Diagnosis not present

## 2021-01-10 DIAGNOSIS — R945 Abnormal results of liver function studies: Secondary | ICD-10-CM | POA: Diagnosis not present

## 2021-01-10 LAB — CBC WITH DIFFERENTIAL/PLATELET
Abs Immature Granulocytes: 0.08 10*3/uL — ABNORMAL HIGH (ref 0.00–0.07)
Basophils Absolute: 0.1 10*3/uL (ref 0.0–0.1)
Basophils Relative: 1 %
Eosinophils Absolute: 0.2 10*3/uL (ref 0.0–0.5)
Eosinophils Relative: 1 %
HCT: 39.8 % (ref 39.0–52.0)
Hemoglobin: 12.9 g/dL — ABNORMAL LOW (ref 13.0–17.0)
Immature Granulocytes: 1 %
Lymphocytes Relative: 10 %
Lymphs Abs: 1.2 10*3/uL (ref 0.7–4.0)
MCH: 30.3 pg (ref 26.0–34.0)
MCHC: 32.4 g/dL (ref 30.0–36.0)
MCV: 93.4 fL (ref 80.0–100.0)
Monocytes Absolute: 1 10*3/uL (ref 0.1–1.0)
Monocytes Relative: 8 %
Neutro Abs: 9.8 10*3/uL — ABNORMAL HIGH (ref 1.7–7.7)
Neutrophils Relative %: 79 %
Platelets: 152 10*3/uL (ref 150–400)
RBC: 4.26 MIL/uL (ref 4.22–5.81)
RDW: 18.7 % — ABNORMAL HIGH (ref 11.5–15.5)
WBC: 12.3 10*3/uL — ABNORMAL HIGH (ref 4.0–10.5)
nRBC: 0 % (ref 0.0–0.2)

## 2021-01-10 LAB — COMPREHENSIVE METABOLIC PANEL
ALT: 69 U/L — ABNORMAL HIGH (ref 0–44)
AST: 198 U/L — ABNORMAL HIGH (ref 15–41)
Albumin: 2.3 g/dL — ABNORMAL LOW (ref 3.5–5.0)
Alkaline Phosphatase: 477 U/L — ABNORMAL HIGH (ref 38–126)
Anion gap: 10 (ref 5–15)
BUN: 15 mg/dL (ref 8–23)
CO2: 24 mmol/L (ref 22–32)
Calcium: 9.2 mg/dL (ref 8.9–10.3)
Chloride: 105 mmol/L (ref 98–111)
Creatinine, Ser: 0.77 mg/dL (ref 0.61–1.24)
GFR, Estimated: >60 mL/min (ref >60)
Glucose, Bld: 102 mg/dL — ABNORMAL HIGH (ref 70–99)
Potassium: 3.9 mmol/L (ref 3.5–5.1)
Sodium: 139 mmol/L (ref 135–145)
Total Bilirubin: 2.1 mg/dL — ABNORMAL HIGH (ref 0.3–1.2)
Total Protein: 5.9 g/dL — ABNORMAL LOW (ref 6.5–8.1)

## 2021-01-10 LAB — GLUCOSE, CAPILLARY
Glucose-Capillary: 104 mg/dL — ABNORMAL HIGH (ref 70–99)
Glucose-Capillary: 105 mg/dL — ABNORMAL HIGH (ref 70–99)
Glucose-Capillary: 106 mg/dL — ABNORMAL HIGH (ref 70–99)
Glucose-Capillary: 117 mg/dL — ABNORMAL HIGH (ref 70–99)
Glucose-Capillary: 150 mg/dL — ABNORMAL HIGH (ref 70–99)
Glucose-Capillary: 96 mg/dL (ref 70–99)

## 2021-01-10 LAB — MAGNESIUM: Magnesium: 2.2 mg/dL (ref 1.7–2.4)

## 2021-01-10 LAB — LACTIC ACID, PLASMA
Lactic Acid, Venous: 4.2 mmol/L (ref 0.5–1.9)
Lactic Acid, Venous: 4.1 mmol/L (ref 0.5–1.9)

## 2021-01-10 LAB — PHOSPHORUS: Phosphorus: 3.2 mg/dL (ref 2.5–4.6)

## 2021-01-10 LAB — AMMONIA: Ammonia: 41 umol/L — ABNORMAL HIGH (ref 9–35)

## 2021-01-10 MED ORDER — MIRTAZAPINE 15 MG PO TBDP
15.0000 mg | ORAL_TABLET | Freq: Every day | ORAL | Status: DC
  Administered 2021-01-10 – 2021-01-17 (×8): 15 mg via ORAL
  Filled 2021-01-10 (×9): qty 1

## 2021-01-10 MED ORDER — MODAFINIL 100 MG PO TABS
100.0000 mg | ORAL_TABLET | Freq: Every day | ORAL | Status: DC
  Administered 2021-01-10 – 2021-01-18 (×9): 100 mg via ORAL
  Filled 2021-01-10 (×9): qty 1

## 2021-01-10 NOTE — Plan of Care (Signed)
  Problem: Clinical Measurements: Goal: Ability to maintain clinical measurements within normal limits will improve Outcome: Progressing Goal: Will remain free from infection Outcome: Progressing Goal: Diagnostic test results will improve Outcome: Progressing Goal: Respiratory complications will improve Outcome: Progressing Goal: Cardiovascular complication will be avoided Outcome: Progressing   Problem: Coping: Goal: Level of anxiety will decrease Outcome: Progressing   Problem: Pain Managment: Goal: General experience of comfort will improve Outcome: Progressing   Problem: Safety: Goal: Ability to remain free from injury will improve Outcome: Progressing

## 2021-01-10 NOTE — Progress Notes (Signed)
PT Cancellation Note  Patient Details Name: DEMERE DOTZLER MRN: [id number] DOB: 05/09/1941   Cancelled Treatment:    Reason Eval/Treat Not Completed: Medical issues which prohibited therapy;Patient not medically ready Pt continues to be highly lethargic with limited command following. Per RN not appropriate for PT therapies.   1:44 PM, 01/10/21 Zenith Lamphier A. Saverio Danker PT, DPT Physical Therapist - [facility]  Sherice Ijames A Eliud Polo 01/10/2021, 1:43 PM

## 2021-01-10 NOTE — Progress Notes (Signed)
Hematology/Oncology Consult note [facility]  [phone] [phone]  Patient Care Team: [name], MD as PCP - General (Cardiology) Jessy Oto, MD as Consulting Physician (Orthopedic Surgery) Telford Nab, RN as Oncology Nurse Navigator   Name of the patient: Eddie Carter  [id number]  Apr 18, 1941   Date of visit:01/09/21    Interval history-patient seen and evaluated at the bedside today.  He was able to recognize me and was slow to respond.  He was intermittently confused.  However he was able to verbalize that he had metastatic lung cancer and acknowledged that he was getting weaker.  He asked me if I contacted his ex-wife.  He was also inquiring about his grandson who is 55 years old and wanted to know if he was aware.  ECOG PS- 3-4 Pain scale- 3   Review of systems- Review of Systems  Constitutional: Positive for malaise/fatigue.  Gastrointestinal: Positive for abdominal pain.      Allergies  Allergen Reactions  . Asa [Aspirin] Shortness Of Breath  . Bee Venom Anaphylaxis  . Nsaids Other (See Comments)    Makes him bleed.  . Tolmetin Other (See Comments)    Makes him bleed.     Past Medical History:  Diagnosis Date  . Arthritis   . Asthma   . BPH (benign prostatic hyperplasia)   . Cancer ([place])    face- basal cell  . Complication of anesthesia    after septoplasty and uvulectomy-was in icu- [facility] - 10 yrs. ago  . COPD (chronic obstructive pulmonary disease) ([place])   . Depression   . Diabetes mellitus without complication ([place])   . Diverticulitis   . ED (erectile dysfunction)   . Full dentures   . GERD (gastroesophageal reflux disease)    no meds  . Headache    difficulty with headaches- 1950's , again in 1967, 2008- again problems with migrraines   . History of GI bleed   . History of multiple pulmonary nodules   . History of shingles   . History of urethral stricture   . HOH (hard of  hearing)   . HOH (hard of hearing)   . Hypogonadism in male   . IBS (irritable bowel syndrome)   . Joint pain   . Medial meniscus, posterior horn derangement    left knee  . Melanosis coli   . Obesity   . Pneumonia 2014   [facility]  . Shortness of breath   . Skin cancer   . Sleep apnea    uses a cpap, most nights   . Stroke [facility]) 1995   some speech and thought processes slow  . Traumatic tear of lateral meniscus of right knee    right knee  . Urinary frequency   . Urinary incontinence      Past Surgical History:  Procedure Laterality Date  . APPENDECTOMY  1963  . BACK SURGERY    . CARPAL TUNNEL RELEASE     left  . CATARACT EXTRACTION W/PHACO Left 09/13/2018   Procedure: CATARACT EXTRACTION PHACO AND INTRAOCULAR LENS PLACEMENT (IOC);  Surgeon: Birder Robson, MD;  Location: [facility] ORS;  Service: Ophthalmology;  Laterality: Left;  [place]  00:42 CDE 6.73 Fluid pack lot #@ [id number]  . CATARACT EXTRACTION W/PHACO Right 11/23/2019   Procedure: CATARACT EXTRACTION PHACO AND INTRAOCULAR LENS PLACEMENT ([place]) RIGHT;  Surgeon: Birder Robson, MD;  Location: [facility] ORS;  Service: Ophthalmology;  Laterality: Right;  [place] 01:10.1 CDE 10.25 Fluid Pack  Lot # [id number]  . COLONOSCOPY    . COLONOSCOPY WITH PROPOFOL N/A 12/07/2016   Procedure: COLONOSCOPY WITH PROPOFOL;  Surgeon: Lollie Sails, MD;  Location: [facility] ENDOSCOPY;  Service: Endoscopy;  Laterality: N/A;  . COLONOSCOPY WITH PROPOFOL N/A 06/26/2019   Procedure: COLONOSCOPY WITH PROPOFOL;  Surgeon: Lollie Sails, MD;  Location: [facility] ENDOSCOPY;  Service: Endoscopy;  Laterality: N/A;  . EYE SURGERY     foreign body removed fr. eye, ? side   . FOOT FOREIGN BODY REMOVAL  1976   left foot -multiple pieces glass  . HEMORROIDECTOMY  1995  . KNEE ARTHROSCOPY Bilateral 01/17/2013   Procedure: ARTHROSCOPY KNEE BILATERAL WITH MEDIAL AND LATERAL MENISECTOMIES;  Surgeon: Lorn Junes, MD;  Location: [employer];  Service:  Orthopedics;  Laterality: Bilateral;  . KYPHOPLASTY N/A 07/23/2017   Procedure: KYPHOPLASTY- L1;  Surgeon: Hessie Knows, MD;  Location: [facility] ORS;  Service: Orthopedics;  Laterality: N/A;  . LUMBAR LAMINECTOMY N/A 11/05/2014   Procedure: LEFT L3-4 MICRODISCECTOMY;  Surgeon: Jessy Oto, MD;  Location: [place];  Service: Orthopedics;  Laterality: N/A;  . LUMBAR LAMINECTOMY/DECOMPRESSION MICRODISCECTOMY N/A 02/12/2015   Procedure: RE-DO LEFT L3-4 MICRODISCECTOMY;  Surgeon: Jessy Oto, MD;  Location: [place];  Service: Orthopedics;  Laterality: N/A;  . NASAL SEPTUM SURGERY  1995  . TONSILLECTOMY    . UPPER GI ENDOSCOPY    . UVULOPALATOPHARYNGOPLASTY (UPPP)/TONSILLECTOMY/SEPTOPLASTY  1995   same time with septoplasty-ended up ICU almost trached    Social History   Socioeconomic History  . Marital status: Married    Spouse name: Mardene Celeste  . Number of children: 2  . Years of education: 23  . Highest education level: Not on file  Occupational History    Comment: register of deeds  Tobacco Use  . Smoking status: Former Carter    Packs/day: 1.50    Years: 40.00    Pack years: 60.00    Types: Cigarettes    Quit date: 01/14/1996    Years since quitting: 25.0  . Smokeless tobacco: Never Used  Vaping Use  . Vaping Use: Never used  Substance and Sexual Activity  . Alcohol use: No    Comment: wine in a month  . Drug use: No  . Sexual activity: Never  Other Topics Concern  . Not on file  Social History Narrative   Lives at home with wife, son   caffeine use - 2 cups coffee daily, sodas 1 a day, occas tea   Social Determinants of Health   Financial Resource Strain: Not on file  Food Insecurity: Not on file  Transportation Needs: Not on file  Physical Activity: Not on file  Stress: Not on file  Social Connections: Not on file  Intimate Partner Violence: Not on file    Family History  Problem Relation Age of Onset  . Cancer Mother   . Heart attack Father   . Heart disease  Father   . Heart disease Other   . Arthritis Other   . Prostate cancer Neg Hx   . Kidney disease Neg Hx      Current Facility-Administered Medications:  .  albuterol (VENTOLIN HFA) 108 (90 Base) MCG/ACT inhaler 2 puff, 2 puff, Inhalation, Q4H PRN, Ivor Costa, MD .  cholecalciferol (VITAMIN D) tablet 1,000 Units, 1,000 Units, Oral, Daily, Ivor Costa, MD, 1,000 Units at 01/09/21 0930 .  dextromethorphan-guaiFENesin (MUCINEX DM) 30-600 MG per 12 hr tablet 1 tablet, 1 tablet, Oral, BID PRN, Ivor Costa,  MD .  dextrose 5 %-0.9 % sodium chloride infusion, , Intravenous, Continuous, Allie Bossier, MD, Last Rate: 100 mL/hr at 01/10/21 0030, New Bag at 01/10/21 0030 .  donepezil (ARICEPT) tablet 10 mg, 10 mg, Oral, QHS, Ivor Costa, MD, 10 mg at 01/09/21 2111 .  dutasteride (AVODART) capsule 0.5 mg, 0.5 mg, Oral, Daily, Ivor Costa, MD, 0.5 mg at 01/09/21 0932 .  enoxaparin (LOVENOX) injection 60 mg, 0.5 mg/kg, Subcutaneous, Q24H, Dorothe Pea, RPH, 60 mg at 01/09/21 0934 .  feeding supplement (ENSURE ENLIVE / ENSURE PLUS) liquid 237 mL, 237 mL, Oral, TID BM, Sharen Hones, MD, 237 mL at 01/09/21 1917 .  fluticasone (FLONASE) 50 MCG/ACT nasal spray 1 spray, 1 spray, Each Nare, Daily, Ivor Costa, MD, 1 spray at 01/09/21 0934 .  hydrALAZINE (APRESOLINE) injection 5 mg, 5 mg, Intravenous, Q2H PRN, Ivor Costa, MD .  hydrOXYzine (ATARAX/VISTARIL) tablet 10 mg, 10 mg, Oral, TID PRN, Ivor Costa, MD .  hydrOXYzine (ATARAX/VISTARIL) tablet 20 mg, 20 mg, Oral, QHS, Niu, Soledad Gerlach, MD, 20 mg at 01/09/21 2112 .  hydrOXYzine (VISTARIL) injection 25 mg, 25 mg, Intramuscular, Q6H PRN, Ivor Costa, MD .  insulin aspart (novoLOG) injection 0-9 Units, 0-9 Units, Subcutaneous, Q4H, Allie Bossier, MD, 1 Units at 01/09/21 2002 .  levothyroxine (SYNTHROID) tablet 50 mcg, 50 mcg, Oral, Q0600, Ivor Costa, MD, 50 mcg at 01/10/21 0545 .  megestrol (MEGACE) 400 MG/10ML suspension 400 mg, 400 mg, Oral, Daily, Sharen Hones, MD,  400 mg at 01/09/21 0932 .  melatonin tablet 3 mg, 3 mg, Oral, QHS, Ivor Costa, MD, 3 mg at 01/09/21 2112 .  mometasone-formoterol (DULERA) 200-5 MCG/ACT inhaler 2 puff, 2 puff, Inhalation, BID, Ivor Costa, MD, 2 puff at 01/09/21 2112 .  multivitamin with minerals tablet 1 tablet, 1 tablet, Oral, Daily, Sharen Hones, MD, 1 tablet at 01/09/21 0931 .  oxyCODONE (Oxy IR/ROXICODONE) immediate release tablet 5 mg, 5 mg, Oral, Q12H PRN, Allie Bossier, MD .  senna-docusate (Senokot-S) tablet 2 tablet, 2 tablet, Oral, BID, Sharen Hones, MD, 2 tablet at 01/09/21 2111 .  tamsulosin (FLOMAX) capsule 0.4 mg, 0.4 mg, Oral, Daily, Ivor Costa, MD, 0.4 mg at 01/09/21 0931 .  tiotropium (SPIRIVA) inhalation capsule ([facility] use ONLY) 18 mcg, 18 mcg, Inhalation, Daily, Ivor Costa, MD, 18 mcg at 01/09/21 0935 .  topiramate (TOPAMAX) tablet 50 mg, 50 mg, Oral, BID, Ivor Costa, MD, 50 mg at 01/09/21 2112 .  vitamin B-12 (CYANOCOBALAMIN) tablet 1,000 mcg, 1,000 mcg, Oral, Daily, Ivor Costa, MD, 1,000 mcg at 01/09/21 0930  Physical exam:  Vitals:   01/09/21 2003 01/10/21 0008 01/10/21 0421 01/10/21 0819  BP: (!) 143/69 125/70 126/67 137/73  Pulse: 89 82 81 81  Resp: 16 20 20 18   Temp: 98.4 F (36.9 C) (!) 97.5 F (36.4 C) 98.5 F (36.9 C) 98.6 F (37 C)  TempSrc:  Oral Oral Axillary  SpO2: 98% 99% 98% 97%  Weight:      Height:       Physical Exam Constitutional:      Comments: Awake and somewhat sleepy.  However patient responsive to verbal commands and answer his questions appropriately.  On oxygen  HENT:     Head: Normocephalic and atraumatic.  Eyes:     Extraocular Movements: EOM normal.     Pupils: Pupils are equal, round, and reactive to light.  Cardiovascular:     Rate and Rhythm: Normal rate and regular rhythm.     Heart sounds: Normal  heart sounds.  Pulmonary:     Effort: Pulmonary effort is normal.     Breath sounds: Normal breath sounds.  Abdominal:     General: Bowel sounds are normal.      Palpations: Abdomen is soft.  Musculoskeletal:     Cervical back: Normal range of motion.  Skin:    General: Skin is warm and dry.  Neurological:     Comments: Oriented to self and place but not to time      CMP Latest Ref Rng & Units 01/10/2021  Glucose 70 - 99 mg/dL 102(H)  BUN 8 - 23 mg/dL 15  Creatinine 0.61 - 1.24 mg/dL 0.77  Sodium 135 - 145 mmol/L 139  Potassium 3.5 - 5.1 mmol/L 3.9  Chloride 98 - 111 mmol/L 105  CO2 22 - 32 mmol/L 24  Calcium 8.9 - 10.3 mg/dL 9.2  Total Protein 6.5 - 8.1 g/dL 5.9(L)  Total Bilirubin 0.3 - 1.2 mg/dL 2.1(H)  Alkaline Phos 38 - 126 U/L 477(H)  AST 15 - 41 U/L 198(H)  ALT 0 - 44 U/L 69(H)   CBC Latest Ref Rng & Units 01/10/2021  WBC 4.0 - 10.5 K/uL 12.3(H)  Hemoglobin 13.0 - 17.0 g/dL 12.9(L)  Hematocrit 39.0 - 52.0 % 39.8  Platelets 150 - 400 K/uL 152    @IMAGES @  CT ANGIO CHEST PE W OR WO CONTRAST  Result Date: 01/04/2021 CLINICAL DATA:  Left lung mass noted on CT scan of the abdomen from yesterday. EXAM: CT ANGIOGRAPHY CHEST WITH CONTRAST TECHNIQUE: Multidetector CT imaging of the chest was performed using the standard protocol during bolus administration of intravenous contrast. Multiplanar CT image reconstructions and MIPs were obtained to evaluate the vascular anatomy. CONTRAST:  23mL OMNIPAQUE IOHEXOL 350 MG/ML SOLN COMPARISON:  CT abdomen/pelvis 01/03/2021. FINDINGS: Cardiovascular: High mild cardiac enlargement but no pericardial effusion. Moderate atherosclerotic calcifications involving the aorta no focal aneurysm or dissection. Scattered coronary artery calcifications. The pulmonary arteries are unremarkable. No findings for pulmonary embolism. Mediastinum/Nodes: Scattered borderline enlarged mediastinal and hilar lymph nodes. Prevascular lymph node on image number 33/4 measures 9.5 mm. Small AP window nodes are less than 6 mm. Left hilar node on image number 49/4 measures 8 mm. Left-sided subcarinal lymph node on image 58/4  measures 10.5 mm. The esophagus is grossly normal.  The thyroid gland is unremarkable. Lungs/Pleura: As demonstrated on the recent abdominal CT scan there is a lobulated appearing mass in the left lower lobe. No air bronchograms to suggest this is an infiltrate. It measures approximately 6.5 x 5.2 cm and is worrisome for neoplasm. 10 mm nodule in the left upper lobe on image number 47/6. 8 mm subpleural nodule in the left lower lobe on image number 58/6. 5.5 mm right lower lobe pulmonary nodule on image number 50/6. Findings consistent with pulmonary metastatic disease. Underlying emphysematous changes and pulmonary scarring. Upper Abdomen: Innumerable hepatic metastatic lesions are noted. Musculoskeletal: No chest wall mass, supraclavicular or axillary lymphadenopathy the. The bony thorax is intact. No findings suspicious for osseous metastatic disease. Review of the MIP images confirms the above findings. IMPRESSION: 1. 6.5 x 5.2 cm left lower lobe pulmonary mass worrisome for neoplasm. There are borderline enlarged hilar and mediastinal lymph nodes suspicious for metastatic adenopathy. 2. Bilateral pulmonary nodules consistent with pulmonary metastatic disease. 3. Innumerable hepatic metastatic lesions. 4. No findings suspicious for osseous metastatic disease. 5. PET-CT may be helpful for further evaluation and staging purposes. 6. Emphysema and aortic atherosclerosis. Aortic Atherosclerosis (ICD10-I70.0) and Emphysema (  ICD10-J43.9). Electronically Signed   By: Marijo Sanes M.D.   On: 01/04/2021 15:33   MR BRAIN W WO CONTRAST  Result Date: 01/04/2021 CLINICAL DATA:  Non-small cell lung cancer.  Staging. EXAM: MRI HEAD WITHOUT AND WITH CONTRAST TECHNIQUE: Multiplanar, multiecho pulse sequences of the brain and surrounding structures were obtained without and with intravenous contrast. CONTRAST:  39mL GADAVIST GADOBUTROL 1 MMOL/ML IV SOLN COMPARISON:  Head CT 07/13/2017 FINDINGS: Brain: Axial and coronal  postcontrast imaging are markedly degraded by patient motion. No focal abnormality affects the brainstem or cerebellum. Within the cerebral hemispheres, there is a punctate focus of restricted diffusion adjacent to the posterior body of the left lateral ventricle image 79. There is a punctate focus of restricted diffusion in a left parietal vertex gyrus, image 88. The diffusion findings are suspicious for tiny metastases, but that cannot be confirmed because of the motion degradation. Consider repeat axial and coronal imaging when the patient is able to tolerate the procedure better. Otherwise, there are mild chronic small-vessel ischemic changes of the hemispheric white matter. No cortical or large vessel territory infarction. No sign of hemorrhage, hydrocephalus or extra-axial collection. Vascular: Major vessels at the base of the brain show flow. Skull and upper cervical spine: Negative Sinuses/Orbits: Clear/normal Other: None IMPRESSION: 1. Markedly motion degraded exam. Axial and coronal postcontrast imaging was markedly degraded and essentially nondiagnostic. 2. Two punctate foci of restricted diffusion in the left cerebral hemisphere, one adjacent to the posterior body of the left lateral ventricle and the other in a left parietal vertex gyrus. The diffusion findings are suspicious for tiny metastases, but that cannot be confirmed because of the motion degradation. Consider repeat axial and coronal postcontrast imaging when the patient is able to tolerate the procedure. Electronically Signed   By: Nelson Chimes M.D.   On: 01/04/2021 19:26   CT ABDOMEN PELVIS W CONTRAST  Result Date: 01/03/2021 CLINICAL DATA:  Right lower quadrant abdominal pain. EXAM: CT ABDOMEN AND PELVIS WITH CONTRAST TECHNIQUE: Multidetector CT imaging of the abdomen and pelvis was performed using the standard protocol following bolus administration of intravenous contrast. CONTRAST:  138mL OMNIPAQUE IOHEXOL 300 MG/ML  SOLN COMPARISON:   October 29, 2014. FINDINGS: Lower chest: 5.6 x 4.4 cm lobulated mass is noted in the left lower lobe laterally concerning for malignancy. Hepatobiliary: No gallstones or biliary dilatation is noted. Multiple rounded low densities are noted throughout the liver of varying sizes consistent with diffuse hepatic metastases. The largest measures 7.1 x 4.3 cm in the left hepatic lobe. Pancreas: Unremarkable. No pancreatic ductal dilatation or surrounding inflammatory changes. Spleen: Normal in size without focal abnormality. Adrenals/Urinary Tract: Adrenal glands are unremarkable. Kidneys are normal, without renal calculi, focal lesion, or hydronephrosis. Bladder is unremarkable. Stomach/Bowel: Stomach appears normal. Status post appendectomy. There is no evidence of bowel obstruction or inflammation. Vascular/Lymphatic: Atherosclerosis of abdominal aorta is noted without aneurysm or dissection. 2.4 cm left periaortic lymph node is noted concerning for metastatic disease. Reproductive: Prostate is unremarkable. Other: No abdominal wall hernia or abnormality. No abdominopelvic ascites. Musculoskeletal: No acute or significant osseous findings. 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 IMPRESSION: 1. 5.6 x 4.4 cm lobulated mass is noted in the left lower lobe laterally concerning for malignancy. 2. Multiple rounded low densities are noted throughout the liver of varying sizes consistent with diffuse hepatic metastases. The largest measures 7.1 x  4.3 cm in the left hepatic lobe. 3. 2.4 cm left periaortic lymph node is noted concerning for metastatic disease. 4. Aortic atherosclerosis. Aortic Atherosclerosis (ICD10-I70.0). Electronically Signed   By: Marijo Conception M.D.   On: 01/03/2021 13:51   US BIOPSY (LIVER)  Result Date: 01/06/2021 INDICATION: Lung and multiple liver lesions suspicious  for metastatic disease EXAM: Ultrasound-guided biopsy of liver lesions MEDICATIONS: None. ANESTHESIA/SEDATION: None COMPLICATIONS: None immediate. PROCEDURE: Informed written consent was obtained from the patient after a thorough discussion of the procedural risks, benefits and alternatives. All questions were addressed. Maximal Sterile Barrier Technique was utilized including caps, mask, sterile gowns, sterile gloves, sterile drape, hand hygiene and skin antiseptic. A timeout was performed prior to the initiation of the procedure. Patient position supine on the ultrasound table. Epigastric skin prepped and draped in usual sterile fashion. Following local lidocaine administration, 17 gauge introducer needle was advanced into 1 of the left hepatic lobe lesions, and four 18 gauge cores were obtained utilizing continuous ultrasound guidance. Gelfoam slurry was administered through the introducer needle at the biopsy site. Samples were sent to pathology in formalin. Needle removed and hemostasis achieved with 5 minutes of manual compression. Post procedure ultrasound images showed no evidence of significant hemorrhage. IMPRESSION: Ultrasound-guided biopsy of left liver lesion as above. Electronically Signed   By: Miachel Roux M.D.   On: 01/06/2021 13:46   ECHOCARDIOGRAM COMPLETE  Result Date: 01/08/2021    ECHOCARDIOGRAM REPORT   Patient Name:   Eddie Carter Date of Exam: 01/08/2021 Medical Rec #:  [id number]      Height:       72.0 in Accession #:    [id number]     Weight:       260.0 lb Date of Birth:  1941/06/19       BSA:          2.382 m Patient Age:    34 years       BP:           123/58 mmHg Patient Gender: M              HR:           91 bpm. Exam Location:  [facility] Procedure: 2D Echo, Color Doppler and Cardiac Doppler Indications:     CHF-acute systolic J88.41  History:         Patient has no prior history of Echocardiogram examinations.                  Stroke, Signs/Symptoms:Shortness of Breath; Risk  Factors:Sleep                  Apnea.  Sonographer:     Sherrie Sport RDCS (AE) Referring Phys:  [id number] Geraldo Docker WOODS Diagnosing Phys: Kate Sable MD  Sonographer Comments: No apical window, no subcostal window and Technically challenging study due to limited acoustic windows. IMPRESSIONS  1. Left ventricular ejection fraction, by estimation, is 60 to 65%. The left ventricle has normal function. The left ventricle has no regional wall motion abnormalities. There is mild left ventricular hypertrophy. Left ventricular diastolic function could not be evaluated.  2. Right ventricular systolic function is normal. The right ventricular size is not well visualized.  3. The mitral valve is degenerative. No evidence of mitral valve regurgitation. Moderate mitral annular calcification.  4. The aortic valve is normal in structure. Aortic valve regurgitation is not visualized.  5. Aortic dilatation noted. There is mild dilatation of the  aortic root, measuring 39 mm. 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 FINDINGS  Left Ventricle: Left ventricular ejection fraction, by estimation, is 60 to 65%. The left ventricle has normal function. The left ventricle has no regional wall motion abnormalities. The left ventricular internal cavity size was normal in size. There is  mild left ventricular hypertrophy. Left ventricular diastolic function could not be evaluated. Right Ventricle: The right ventricular size is not well visualized. No increase in right ventricular wall thickness. Right ventricular systolic function is normal. Left Atrium: Left atrial size was normal in size. Right Atrium: Right atrial size was not well visualized. Pericardium: There is no evidence of pericardial effusion. Mitral Valve: The mitral valve is degenerative in appearance. Moderate mitral annular calcification. No evidence of mitral valve regurgitation. Tricuspid Valve: The tricuspid valve is normal in structure.  Tricuspid valve regurgitation is not demonstrated. Aortic Valve: The aortic valve is normal in structure. Aortic valve regurgitation is not visualized. Pulmonic Valve: The pulmonic valve was not well visualized. Pulmonic valve regurgitation is not visualized. Aorta: Aortic dilatation noted. There is mild dilatation of the aortic root, measuring 39 mm. 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 Venous: The inferior vena cava was not well visualized. IAS/Shunts: The interatrial septum was not well visualized.  LEFT VENTRICLE PLAX 2D LVIDd:         4.54 cm LVIDs:         2.62 cm LV PW:         1.45 cm LV IVS:        1.58 cm LVOT diam:     2.30 cm LVOT Area:     4.15 cm  LEFT ATRIUM         Index LA diam:    2.80 cm 1.18 cm/m                        PULMONIC VALVE AORTA                 PV Vmax:        0.48 m/s Ao Root diam: 4.13 cm PV Peak grad:   0.9 mmHg                       RVOT Peak grad: 2 mmHg   SHUNTS Systemic Diam: 2.30 cm Kate Sable MD Electronically signed by Kate Sable MD Signature Date/Time: 01/08/2021/3:58:28 PM    Final      Assessment and plan- Patient is a 80 y.o. 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  He is slow to respond to questions but does understand that he has metastatic lung cancer which is incurable.  He understands that he was getting weaker and would not be a candidate for chemotherapy.  I explained to the patient that I will be speaking to his son [name] later today and patient was in agreement with that as well.  I did call patient's son [name] later that evening and explained to him that patient has high-grade neuroendocrine carcinoma which could be either small cell or large cell lung cancer.  These tumors are typically aggressive.  He has stage IV disease and treatment would be  palliative and not curative.  Patient's performance status is poor presently and I do not think the patient  would ever be a candidate for any systemic treatment.  Even prior to his hospitalization, patient was wheelchair-bound living alone and his health was declining.  We discussed the following options:  1.  Assess how he does with trial of IV fluids.  If overall his condition improves we may be looking at transition to subacute rehab first followed by long-term nursing facility with hospice if patient cannot directly be discharged from hospital to a nursing home with hospice facility 2.  If patient's oral intake remains poor and his condition decline steadily over the next 2 to 3 days-he may be a candidate for hospice home  Greatly appreciate input from palliative care NP [facility] as well.  We will continue our conversations with patient and his son to come to a safe disposition soon.  It would be reasonable to have hospice on board at this time  Hypoactive delirium: Likely secondary to metabolic encephalopathy. Although patient's liver functions are elevated his bilirubin is not significantly high and PT/INR 4 days ago showed a mildly elevated INR of 1.3.  It would be difficult to attribute encephalopathy secondary to liver failure.  Given the motion artifact it was unclear if patient truly had brain mets and regardless they appear to be small even if they exist and would not explain patient's delirium.  Elevated lactic acid: Could be secondary to poor nutrition and hydration leading to tissue ischemia.  Clinically patient does not appear to have any active infection.  Reasonable to give a trial of IV fluids to see if it improves but lactic acid per se would not change patient's overall management   Total face to face encounter time for this patient visit was 50 min.  >50% time spent in counseling and coordination of care and in-depth conversation with patient's son      Visit  Diagnosis 1.  Metastatic high-grade neuroendocrine carcinoma 2. Goals of care counseling/discussion   Dr. Randa Evens, MD, MPH [facility] at [facility] [phone] 01/10/2021 8:25 AM

## 2021-01-10 NOTE — Progress Notes (Addendum)
Nutrition Follow-up  DOCUMENTATION CODES:   Obesity unspecified  INTERVENTION:   -Continue to offer food as pt wants.  -Continue to offer Ensure Enlive TID, each supplement provides  -Continue MVI with minerals daily  NUTRITION DIAGNOSIS:   Inadequate oral intake related to poor appetite,nausea as evidenced by per patient/family report.  Ongoing.  GOAL:   Patient will meet greater than or equal to 90% of their needs  Ongoing.   MONITOR:   Labs,I & O's,Diet advancement,Supplement acceptance,PO intake,Weight trends,Skin  REASON FOR ASSESSMENT:   Malnutrition Screening Tool    ASSESSMENT:   80 year old male admitted with lung mass. 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 Hx of diet-controlled DM, COPD, asthma, GERD, hypothyroidism, depression, anxiety, OSA on CPAP, IBS, urethral stricture, diverticulitis, GIB, BPH, ILD, dementia, presents with 3 week onset of abdominal pain associated with nausea without vomiting, poor oral intake, and right sided pleuritic chest pain.   Pt was unable to wake up during time of intern's visit, therefore unable to get a nutrition history from pt.   Notes indicate that pt has had a poor po intake, average of 30% since 02/16. Megestrol was started for poor appetite. Pt's breakfast tray was still in the room during intern's visit and showed 0% had been consumed. However pt had consumed 1/2 of an Ensure Max Protein.  No aggressive nutrition interventions warranted at this time. Continue offering food and oral nutrition supplements as pt's desired.   Pt's weights reviewed and show now significant changes.   Pt is being followed by palliative care. Plan is to support pt for a few days with IV fluids and oral nutrition supplements. If no clinical improvement, plan for hospice care.   Meds Reviewed: Vitamin D (1000 IU, daily), Ensure Enlive TID, Megace (400 mg, daily), MVI with minerals (daily), Senekot (2  tablet, BID), Cyanocobalamin (1000 mcg, daily)   Labs Reviewed: Glucose (96 mg/dL)  NUTRITION - FOCUSED PHYSICAL EXAM:  Flowsheet Row Most Recent Value  Orbital Region No depletion  Upper Arm Region No depletion  Thoracic and Lumbar Region No depletion  Buccal Region Mild depletion  Temple Region Mild depletion  Clavicle Bone Region No depletion  Clavicle and Acromion Bone Region No depletion  Scapular Bone Region No depletion  Dorsal Hand No depletion  Patellar Region No depletion  Anterior Thigh Region No depletion  Posterior Calf Region No depletion  Edema (RD Assessment) Mild  [R and L lower extremities]  Hair Reviewed  Eyes Reviewed  Mouth Reviewed  Skin Reviewed  Nails Reviewed       Diet Order:   Diet Order            Diet regular Room service appropriate? Yes; Fluid consistency: Thin  Diet effective now                 EDUCATION NEEDS:   Not appropriate for education at this time  Skin:  Skin Assessment: Reviewed RN Assessment  Last BM:  02/18 (type 7)  Height:   Ht Readings from Last 1 Encounters:  01/03/21 6' (1.829 m)    Weight:   Wt Readings from Last 1 Encounters:  01/03/21 117.9 kg    Ideal Body Weight:  80.9 kg  BMI:  Body mass index is 35.26 kg/m.  Estimated Nutritional Needs:   Kcal:  9811-9147  Protein:  130-145  Fluid:  >/= 2.5 L    Salvadore Oxford, Dietetic Intern 01/10/2021 11:49 AM

## 2021-01-10 NOTE — Progress Notes (Signed)
[place]  [phone] [phone]   Name: Eddie Carter Date: 01/10/2021 MRN: [id number]  DOB: 1941/11/17  Patient Care Team: [name], MD as PCP - General (Cardiology) Jessy Oto, MD as Consulting Physician (Orthopedic Surgery) Telford Nab, RN as Oncology Nurse Navigator    REASON FOR CONSULTATION: Eddie Carter is a 80 y.o. male with multiple medical problems including COPD, OSA on CPAP, ILD, history of CVA, IBS, urethral stricture DM, and mild dementia.  Patient was admitted to the hospital on 01/03/2021 with persistent abdominal pain.  CT of the abdomen/pelvis revealed a left lower lobe mass and diffuse hepatic metastases.  MRI of the brain revealed two punctate foci in the left cerebral hemisphere concerning for tiny metastases.  Palliative care was consulted to help address goals..    SOCIAL HISTORY:     reports that he quit smoking about 25 years ago. His smoking use included cigarettes. He has a 60.00 pack-year smoking history. He has never used smokeless tobacco. He reports that he does not drink alcohol and does not use drugs.  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  Patient lives at home alone.  He is in process of divorcing his wife. He has two sons.   ADVANCE DIRECTIVES:  So is reportedly his HCPOA  CODE STATUS: DNR  PAST MEDICAL HISTORY: Past Medical History:  Diagnosis Date  . Arthritis   . Asthma   . BPH (benign prostatic hyperplasia)   . 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 Complication of anesthesia    after septoplasty and uvulectomy-was in icu- [facility] - 10 yrs. ago  . COPD (chronic obstructive pulmonary disease) ([place])   . Depression   . Diabetes mellitus without complication ([place])   . Diverticulitis   . ED (erectile dysfunction)   . Full dentures   . GERD  (gastroesophageal reflux disease)    no meds  . Headache    difficulty with headaches- 1950's , again in 1967, 2008- again problems with migrraines   . History of GI bleed   . History of multiple pulmonary nodules   . History of shingles   . History of urethral stricture   . HOH (hard of hearing)   . HOH (hard of hearing)   . Hypogonadism in male   . IBS (irritable bowel syndrome)   . Joint pain   . Medial meniscus, posterior horn derangement    left knee  . Melanosis coli   . Obesity   . Pneumonia 2014   [facility]  . Shortness of breath   . Skin cancer   . Sleep apnea    uses a cpap, most nights   . Stroke [facility]) 1995   some speech and thought processes slow  . Traumatic tear of lateral meniscus of right knee    right knee  . Urinary frequency   . Urinary incontinence     PAST SURGICAL HISTORY:  Past Surgical History:  Procedure Laterality Date  . APPENDECTOMY  1963  . BACK SURGERY    . CARPAL TUNNEL RELEASE     left  . CATARACT EXTRACTION W/PHACO Left 09/13/2018   Procedure: CATARACT EXTRACTION PHACO AND INTRAOCULAR LENS PLACEMENT (IOC);  Surgeon: Birder Robson, MD;  Location: [facility] ORS;  Service: Ophthalmology;  Laterality: Left;  [place]  00:42  CDE 6.73 Fluid pack lot #[id number]  . CATARACT EXTRACTION W/PHACO Right 11/23/2019   Procedure: CATARACT EXTRACTION PHACO AND INTRAOCULAR LENS PLACEMENT ([place]) RIGHT;  Surgeon: Birder Robson, MD;  Location: [facility] ORS;  Service: Ophthalmology;  Laterality: Right;  [place] 01:10.1 CDE 10.25 Fluid Pack Lot # [id number]  . COLONOSCOPY    . COLONOSCOPY WITH PROPOFOL N/A 12/07/2016   Procedure: COLONOSCOPY WITH PROPOFOL;  Surgeon: Lollie Sails, MD;  Location[facility] ENDOSCOPY;  Service: Endoscopy;  Laterality: N/A;  . COLONOSCOPY WITH PROPOFOL N/A 06/26/2019   Procedure: COLONOSCOPY WITH PROPOFOL;  Surgeon: Lollie Sails, MD;  Location: [facility] ENDOSCOPY;  Service: Endoscopy;  Laterality: N/A;  . EYE SURGERY     foreign body removed fr.  eye, ? side   . FOOT FOREIGN BODY REMOVAL  1976   left foot -multiple pieces glass  . HEMORROIDECTOMY  1995  . KNEE ARTHROSCOPY Bilateral 01/17/2013   Procedure: ARTHROSCOPY KNEE BILATERAL WITH MEDIAL AND LATERAL MENISECTOMIES;  Surgeon: Lorn Junes, MD;  Location: [place];  Service: Orthopedics;  Laterality: Bilateral;  . KYPHOPLASTY N/A 07/23/2017   Procedure: KYPHOPLASTY- L1;  Surgeon: Hessie Knows, MD;  Location: [facility] ORS;  Service: Orthopedics;  Laterality: N/A;  . LUMBAR LAMINECTOMY N/A 11/05/2014   Procedure: LEFT L3-4 MICRODISCECTOMY;  Surgeon: Jessy Oto, MD;  Location: [place];  Service: Orthopedics;  Laterality: N/A;  . LUMBAR LAMINECTOMY/DECOMPRESSION MICRODISCECTOMY N/A 02/12/2015   Procedure: RE-DO LEFT L3-4 MICRODISCECTOMY;  Surgeon: Jessy Oto, MD;  Location: [place];  Service: Orthopedics;  Laterality: N/A;  . NASAL SEPTUM SURGERY  1995  . TONSILLECTOMY    . UPPER GI ENDOSCOPY    . UVULOPALATOPHARYNGOPLASTY (UPPP)/TONSILLECTOMY/SEPTOPLASTY  1995   same time with septoplasty-ended up ICU almost trached    HEMATOLOGY/ONCOLOGY HISTORY:  Oncology History   No history exists.    ALLERGIES:  is allergic to asa [aspirin], bee venom, nsaids, and tolmetin.  MEDICATIONS:  Current Facility-Administered Medications  Medication Dose Route Frequency Provider Last Rate Last Admin  . albuterol (VENTOLIN HFA) 108 (90 Base) MCG/ACT inhaler 2 puff  2 puff Inhalation Q4H PRN Ivor Costa, MD      . cholecalciferol (VITAMIN D) tablet 1,000 Units  1,000 Units Oral Daily Ivor Costa, MD   1,000 Units at 01/10/21 [phone]  . dextromethorphan-guaiFENesin (MUCINEX DM) 30-600 MG per 12 hr tablet 1 tablet  1 tablet Oral BID PRN Ivor Costa, MD      . dextrose 5 %-0.9 % sodium chloride infusion   Intravenous Continuous Allie Bossier, MD 100 mL/hr at 01/10/21 1044 New Bag at 01/10/21 1044  . donepezil (ARICEPT) tablet 10 mg  10 mg Oral QHS Ivor Costa, MD   10 mg at 01/09/21 2111   . dutasteride (AVODART) capsule 0.5 mg  0.5 mg Oral Daily Ivor Costa, MD   0.5 mg at 01/10/21 0835  . enoxaparin (LOVENOX) injection 60 mg  0.5 mg/kg Subcutaneous Q24H Dorothe Pea, RPH   60 mg at 01/10/21 5277  . feeding supplement (ENSURE ENLIVE / ENSURE PLUS) liquid 237 mL  237 mL Oral TID BM Sharen Hones, MD   237 mL at 01/10/21 0837  . fluticasone (FLONASE) 50 MCG/ACT nasal spray 1 spray  1 spray Each Nare Daily Ivor Costa, MD   1 spray at 01/10/21 0835  . hydrALAZINE (APRESOLINE) injection 5 mg  5 mg Intravenous Q2H PRN Ivor Costa, MD      . hydrOXYzine (ATARAX/VISTARIL) tablet 10 mg  10  mg Oral TID PRN Ivor Costa, MD      . hydrOXYzine (ATARAX/VISTARIL) tablet 20 mg  20 mg Oral QHS Ivor Costa, MD   20 mg at 01/09/21 2112  . hydrOXYzine (VISTARIL) injection 25 mg  25 mg Intramuscular Q6H PRN Ivor Costa, MD      . insulin aspart (novoLOG) injection 0-9 Units  0-9 Units Subcutaneous Q4H Allie Bossier, MD   1 Units at 01/09/21 2002  . levothyroxine (SYNTHROID) tablet 50 mcg  50 mcg Oral Q0600 Ivor Costa, MD   50 mcg at 01/10/21 0545  . megestrol (MEGACE) 400 MG/10ML suspension 400 mg  400 mg Oral Daily Sharen Hones, MD   400 mg at 01/10/21 0836  . melatonin tablet 3 mg  3 mg Oral QHS Ivor Costa, MD   3 mg at 01/09/21 2112  . mirtazapine (REMERON SOL-TAB) disintegrating tablet 15 mg  15 mg Oral QHS Clapacs, John T, MD      . modafinil (PROVIGIL) tablet 100 mg  100 mg Oral Daily Clapacs, John T, MD      . mometasone-formoterol (DULERA) 200-5 MCG/ACT inhaler 2 puff  2 puff Inhalation BID Ivor Costa, MD   2 puff at 01/10/21 [phone]  . multivitamin with minerals tablet 1 tablet  1 tablet Oral Daily Sharen Hones, MD   1 tablet at 01/10/21 [phone]  . oxyCODONE (Oxy IR/ROXICODONE) immediate release tablet 5 mg  5 mg Oral Q12H PRN Allie Bossier, MD      . senna-docusate (Senokot-S) tablet 2 tablet  2 tablet Oral BID Sharen Hones, MD   2 tablet at 01/09/21 2111  . tamsulosin (FLOMAX) capsule 0.4 mg   0.4 mg Oral Daily Ivor Costa, MD   0.4 mg at 01/10/21 0836  . tiotropium (SPIRIVA) inhalation capsule ([facility] use ONLY) 18 mcg  18 mcg Inhalation Daily Ivor Costa, MD   18 mcg at 01/10/21 0835  . topiramate (TOPAMAX) tablet 50 mg  50 mg Oral BID Ivor Costa, MD   50 mg at 01/10/21 0835  . vitamin B-12 (CYANOCOBALAMIN) tablet 1,000 mcg  1,000 mcg Oral Daily Ivor Costa, MD   1,000 mcg at 01/10/21 0836    VITAL SIGNS: BP 137/73 (BP Location: Right Arm)   Pulse 81   Temp 98.6 F (37 C) (Axillary)   Resp 18   Ht 6' (1.829 m)   Wt 260 lb (117.9 kg)   SpO2 97%   BMI 35.26 kg/m  Filed Weights   01/03/21 1104  Weight: 260 lb (117.9 kg)    Estimated body mass index is 35.26 kg/m as calculated from the following:   Height as of this encounter: 6' (1.829 m).   Weight as of this encounter: 260 lb (117.9 kg).  LABS: CBC:    Component Value Date/Time   WBC 12.3 (H) 01/10/2021 0550   HGB 12.9 (L) 01/10/2021 0550   HGB 17.5 10/28/2014 2234   HCT 39.8 01/10/2021 0550   HCT 44.1 01/16/2016 0843   PLT 152 01/10/2021 0550   PLT 189 10/28/2014 2234   MCV 93.4 01/10/2021 0550   MCV 94 10/28/2014 2234   NEUTROABS 9.8 (H) 01/10/2021 0550   NEUTROABS 13.2 (H) 07/28/2013 0643   LYMPHSABS 1.2 01/10/2021 0550   LYMPHSABS 1.6 07/28/2013 0643   MONOABS 1.0 01/10/2021 0550   MONOABS 0.8 07/28/2013 0643   EOSABS 0.2 01/10/2021 0550   EOSABS 0.0 07/28/2013 0643   BASOSABS 0.1 01/10/2021 0550   BASOSABS 0.0 07/28/2013 1638  Comprehensive Metabolic Panel:    Component Value Date/Time   NA 139 01/10/2021 0550   NA 138 10/28/2014 2234   K 3.9 01/10/2021 0550   K 3.9 10/28/2014 2234   CL 105 01/10/2021 0550   CL 100 10/28/2014 2234   CO2 24 01/10/2021 0550   CO2 28 10/28/2014 2234   BUN 15 01/10/2021 0550   BUN 22 (H) 10/28/2014 2234   CREATININE 0.77 01/10/2021 0550   CREATININE 1.09 06/05/2020 1210   GLUCOSE 102 (H) 01/10/2021 0550   GLUCOSE 126 (H) 10/28/2014 2234   CALCIUM 9.2  01/10/2021 0550   CALCIUM 8.9 10/28/2014 2234   AST 198 (H) 01/10/2021 0550   AST 37 10/28/2014 2234   ALT 69 (H) 01/10/2021 0550   ALT 69 (H) 10/28/2014 2234   ALKPHOS 477 (H) 01/10/2021 0550   ALKPHOS 69 10/28/2014 2234   BILITOT 2.1 (H) 01/10/2021 0550   BILITOT 0.3 01/16/2016 0843   BILITOT 0.7 10/28/2014 2234   PROT 5.9 (L) 01/10/2021 0550   PROT 6.4 01/16/2016 0843   PROT 7.2 10/28/2014 2234   ALBUMIN 2.3 (L) 01/10/2021 0550   ALBUMIN 3.9 01/16/2016 0843   ALBUMIN 3.8 10/28/2014 2234    RADIOGRAPHIC STUDIES: CT ANGIO CHEST PE W OR WO CONTRAST  Result Date: 01/04/2021 CLINICAL DATA:  Left lung mass noted on CT scan of the abdomen from yesterday. EXAM: CT ANGIOGRAPHY CHEST WITH CONTRAST TECHNIQUE: Multidetector CT imaging of the chest was performed using the standard protocol during bolus administration of intravenous contrast. Multiplanar CT image reconstructions and MIPs were obtained to evaluate the vascular anatomy. CONTRAST:  96mL OMNIPAQUE IOHEXOL 350 MG/ML SOLN COMPARISON:  CT abdomen/pelvis 01/03/2021. FINDINGS: Cardiovascular: High mild cardiac enlargement but no pericardial effusion. Moderate atherosclerotic calcifications involving the aorta no focal aneurysm or dissection. Scattered coronary artery calcifications. The pulmonary arteries are unremarkable. No findings for pulmonary embolism. Mediastinum/Nodes: Scattered borderline enlarged mediastinal and hilar lymph nodes. Prevascular lymph node on image number 33/4 measures 9.5 mm. Small AP window nodes are less than 6 mm. Left hilar node on image number 49/4 measures 8 mm. Left-sided subcarinal lymph node on image 58/4 measures 10.5 mm. The esophagus is grossly normal.  The thyroid gland is unremarkable. Lungs/Pleura: As demonstrated on the recent abdominal CT scan there is a lobulated appearing mass in the left lower lobe. No air bronchograms to suggest this is an infiltrate. It measures approximately 6.5 x 5.2 cm and is  worrisome for neoplasm. 10 mm nodule in the left upper lobe on image number 47/6. 8 mm subpleural nodule in the left lower lobe on image number 58/6. 5.5 mm right lower lobe pulmonary nodule on image number 50/6. Findings consistent with pulmonary metastatic disease. Underlying emphysematous changes and pulmonary scarring. Upper Abdomen: Innumerable hepatic metastatic lesions are noted. Musculoskeletal: No chest wall mass, supraclavicular or axillary lymphadenopathy the. The bony thorax is intact. No findings suspicious for osseous metastatic disease. Review of the MIP images confirms the above findings. IMPRESSION: 1. 6.5 x 5.2 cm left lower lobe pulmonary mass worrisome for neoplasm. There are borderline enlarged hilar and mediastinal lymph nodes suspicious for metastatic adenopathy. 2. Bilateral pulmonary nodules consistent with pulmonary metastatic disease. 3. Innumerable hepatic metastatic lesions. 4. No findings suspicious for osseous metastatic disease. 5. PET-CT may be helpful for further evaluation and staging purposes. 6. Emphysema and aortic atherosclerosis. Aortic Atherosclerosis (ICD10-I70.0) and Emphysema (ICD10-J43.9). Electronically Signed   By: Marijo Sanes M.D.   On: 01/04/2021 15:33   MR  BRAIN W WO CONTRAST  Result Date: 01/04/2021 CLINICAL DATA:  Non-small cell lung cancer.  Staging. EXAM: MRI HEAD WITHOUT AND WITH CONTRAST TECHNIQUE: Multiplanar, multiecho pulse sequences of the brain and surrounding structures were obtained without and with intravenous contrast. CONTRAST:  46mL GADAVIST GADOBUTROL 1 MMOL/ML IV SOLN COMPARISON:  Head CT 07/13/2017 FINDINGS: Brain: Axial and coronal postcontrast imaging are markedly degraded by patient motion. No focal abnormality affects the brainstem or cerebellum. Within the cerebral hemispheres, there is a punctate focus of restricted diffusion adjacent to the posterior body of the left lateral ventricle image 79. There is a punctate focus of restricted  diffusion in a left parietal vertex gyrus, image 88. The diffusion findings are suspicious for tiny metastases, but that cannot be confirmed because of the motion degradation. Consider repeat axial and coronal imaging when the patient is able to tolerate the procedure better. Otherwise, there are mild chronic small-vessel ischemic changes of the hemispheric white matter. No cortical or large vessel territory infarction. No sign of hemorrhage, hydrocephalus or extra-axial collection. Vascular: Major vessels at the base of the brain show flow. Skull and upper cervical spine: Negative Sinuses/Orbits: Clear/normal Other: None IMPRESSION: 1. Markedly motion degraded exam. Axial and coronal postcontrast imaging was markedly degraded and essentially nondiagnostic. 2. Two punctate foci of restricted diffusion in the left cerebral hemisphere, one adjacent to the posterior body of the left lateral ventricle and the other in a left parietal vertex gyrus. The diffusion findings are suspicious for tiny metastases, but that cannot be confirmed because of the motion degradation. Consider repeat axial and coronal postcontrast imaging when the patient is able to tolerate the procedure. Electronically Signed   By: Nelson Chimes M.D.   On: 01/04/2021 19:26   CT ABDOMEN PELVIS W CONTRAST  Result Date: 01/03/2021 CLINICAL DATA:  Right lower quadrant abdominal pain. EXAM: CT ABDOMEN AND PELVIS WITH CONTRAST TECHNIQUE: Multidetector CT imaging of the abdomen and pelvis was performed using the standard protocol following bolus administration of intravenous contrast. CONTRAST:  159mL OMNIPAQUE IOHEXOL 300 MG/ML  SOLN COMPARISON:  October 29, 2014. FINDINGS: Lower chest: 5.6 x 4.4 cm lobulated mass is noted in the left lower lobe laterally concerning for malignancy. Hepatobiliary: No gallstones or biliary dilatation is noted. Multiple rounded low densities are noted throughout the liver of varying sizes consistent with diffuse hepatic  metastases. The largest measures 7.1 x 4.3 cm in the left hepatic lobe. Pancreas: Unremarkable. No pancreatic ductal dilatation or surrounding inflammatory changes. Spleen: Normal in size without focal abnormality. Adrenals/Urinary Tract: Adrenal glands are unremarkable. Kidneys are normal, without renal calculi, focal lesion, or hydronephrosis. Bladder is unremarkable. Stomach/Bowel: Stomach appears normal. Status post appendectomy. There is no evidence of bowel obstruction or inflammation. Vascular/Lymphatic: Atherosclerosis of abdominal aorta is noted without aneurysm or dissection. 2.4 cm left periaortic lymph node is noted concerning for metastatic disease. Reproductive: Prostate is unremarkable. Other: No abdominal wall hernia or abnormality. No abdominopelvic ascites. Musculoskeletal: No acute or significant osseous findings. 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 IMPRESSION: 1. 5.6 x 4.4 cm lobulated mass is noted in the left lower lobe laterally concerning for malignancy. 2. Multiple rounded low densities are noted throughout the liver of varying sizes consistent with diffuse hepatic metastases. The largest measures 7.1 x 4.3 cm in the left hepatic lobe. 3. 2.4 cm left periaortic lymph node is noted concerning for  metastatic disease. 4. Aortic atherosclerosis. Aortic Atherosclerosis (ICD10-I70.0). Electronically Signed   By: Marijo Conception M.D.   On: 01/03/2021 13:51   US BIOPSY (LIVER)  Result Date: 01/06/2021 INDICATION: Lung and multiple liver lesions suspicious for metastatic disease EXAM: Ultrasound-guided biopsy of liver lesions MEDICATIONS: None. ANESTHESIA/SEDATION: None COMPLICATIONS: None immediate. PROCEDURE: Informed written consent was obtained from the patient after a thorough discussion of the procedural risks, benefits and alternatives. All questions were  addressed. Maximal Sterile Barrier Technique was utilized including caps, mask, sterile gowns, sterile gloves, sterile drape, hand hygiene and skin antiseptic. A timeout was performed prior to the initiation of the procedure. Patient position supine on the ultrasound table. Epigastric skin prepped and draped in usual sterile fashion. Following local lidocaine administration, 17 gauge introducer needle was advanced into 1 of the left hepatic lobe lesions, and four 18 gauge cores were obtained utilizing continuous ultrasound guidance. Gelfoam slurry was administered through the introducer needle at the biopsy site. Samples were sent to pathology in formalin. Needle removed and hemostasis achieved with 5 minutes of manual compression. Post procedure ultrasound images showed no evidence of significant hemorrhage. IMPRESSION: Ultrasound-guided biopsy of left liver lesion as above. Electronically Signed   By: Miachel Roux M.D.   On: 01/06/2021 13:46   ECHOCARDIOGRAM COMPLETE  Result Date: 01/08/2021    ECHOCARDIOGRAM REPORT   Patient Name:   MARIS BENA Date of Exam: 01/08/2021 Medical Rec #:  [id number]      Height:       72.0 in Accession #:    [id number]     Weight:       260.0 lb Date of Birth:  1941-08-16       BSA:          2.382 m Patient Age:    72 years       BP:           123/58 mmHg Patient Gender: M              HR:           91 bpm. Exam Location:  [facility] Procedure: 2D Echo, Color Doppler and Cardiac Doppler Indications:     CHF-acute systolic X10.62  History:         Patient has no prior history of Echocardiogram examinations.                  Stroke, Signs/Symptoms:Shortness of Breath; Risk Factors:Sleep                  Apnea.  Sonographer:     Sherrie Sport RDCS (AE) Referring Phys:  [id number] Geraldo Docker WOODS Diagnosing Phys: Kate Sable MD  Sonographer Comments: No apical window, no subcostal window and Technically challenging study due to limited acoustic windows. IMPRESSIONS  1. Left ventricular  ejection fraction, by estimation, is 60 to 65%. The left ventricle has normal function. The left ventricle has no regional wall motion abnormalities. There is mild left ventricular hypertrophy. Left ventricular diastolic function could not be evaluated.  2. Right ventricular systolic function is normal. The right ventricular size is not well visualized.  3. The mitral valve is degenerative. No evidence of mitral valve regurgitation. Moderate mitral annular calcification.  4. The aortic valve is normal in structure. Aortic valve regurgitation is not visualized.  5. Aortic dilatation noted. There is mild dilatation of the aortic root, measuring 39 mm. 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  FINDINGS  Left Ventricle: Left ventricular ejection fraction, by estimation, is 60 to 65%. The left ventricle has normal function. The left ventricle has no regional wall motion abnormalities. The left ventricular internal cavity size was normal in size. There is  mild left ventricular hypertrophy. Left ventricular diastolic function could not be evaluated. Right Ventricle: The right ventricular size is not well visualized. No increase in right ventricular wall thickness. Right ventricular systolic function is normal. Left Atrium: Left atrial size was normal in size. Right Atrium: Right atrial size was not well visualized. Pericardium: There is no evidence of pericardial effusion. Mitral Valve: The mitral valve is degenerative in appearance. Moderate mitral annular calcification. No evidence of mitral valve regurgitation. Tricuspid Valve: The tricuspid valve is normal in structure. Tricuspid valve regurgitation is not demonstrated. Aortic Valve: The aortic valve is normal in structure. Aortic valve regurgitation is not visualized. Pulmonic Valve: The pulmonic valve was not well visualized. Pulmonic valve regurgitation is not visualized. Aorta: Aortic dilatation noted. There is mild dilatation  of the aortic root, measuring 39 mm. 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 Venous: The inferior vena cava was not well visualized. IAS/Shunts: The interatrial septum was not well visualized.  LEFT VENTRICLE PLAX 2D LVIDd:         4.54 cm LVIDs:         2.62 cm LV PW:         1.45 cm LV IVS:        1.58 cm LVOT diam:     2.30 cm LVOT Area:     4.15 cm  LEFT ATRIUM         Index LA diam:    2.80 cm 1.18 cm/m                        PULMONIC VALVE AORTA                 PV Vmax:        0.48 m/s Ao Root diam: 4.13 cm PV Peak grad:   0.9 mmHg                       RVOT Peak grad: 2 mmHg   SHUNTS Systemic Diam: 2.30 cm Kate Sable MD Electronically signed by Kate Sable MD Signature Date/Time: 01/08/2021/3:58:28 PM    Final     PERFORMANCE STATUS (ECOG) : 4 - Bedbound  Review of Systems Unable to complete  Physical Exam General: NAD, frail appearing Pulmonary: Unlabored Extremities: no edema, no joint deformities Skin: no rashes Neurological: Weakness, lethargic  IMPRESSION: Patient is slightly more interactive today.  When prompted, he says that he is "not feeling good" and is "lonely and depressed".  Patient still somewhat lethargic and falls asleep mid conversation.  Oral intake remains poor.  However, it does appear that he drank about half an Ensure this morning.  Will discuss with psychiatry and medical team about option for starting a neuro stimulant.  Spoke with care management.  Rehab might be excluded unless mentation dramatically improved.  Patient will likely benefit from hospice involvement in some capacity at time of discharge.  PLAN: -Best supportive care -Consider trial of modafinil -Will follow  Case and plan discussed with Dr. Janese Banks, Dr. Weber Cooks and Dr. Sherral Hammers  Time Total: 15 minutes  Visit consisted of counseling and education dealing with the complex and emotionally intense issues of symptom management and  palliative care in  the setting of serious and potentially life-threatening illness.Greater than 50%  of this time was spent counseling and coordinating care related to the above assessment and plan.  Signed by: Altha Harm, PhD, NP-C

## 2021-01-10 NOTE — Progress Notes (Signed)
PROGRESS NOTE    Eddie Carter  FYB:[id number] DOB: [birth year] DOA: 01/03/2021 PCP: [name], MD     Brief Narrative:  80 y.o.WM PMH Depression, Anxietyx DM type II, diet-controlled, COPD, Asthma, stroke, GERD, Hypothyroidism, , OSA on CPAP, IBS, urethral stricture, Diverticulitis, GI bleeding, BPH, Interstitial Lung disease, Dementia,   Presents with abdominal pain, chest pain, generalized weakness. CT abdomen/pelvis with contrast showed large left lower lobe mass with multiple liver metastasis.Liver biopsy performed on 2/14.   Subjective: 2/18 afebrile overnight A/O x1 (does not know where, when, why).  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)   Chronic obstructive pulmonary disease (HCC)   Dementia without behavioral disturbance (HCC)   Lung mass   Abnormal LFTs   Hypothyroidism   Elevated lactic acid level   Liver metastasis (HCC)   Chest pain   Palliative care encounter   Failure to thrive in adult   Anorexia   Severe depression ([place])   Suicide ideation   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 -Elevated liver function due to liver metastasis. -2/14 s/p liver biopsy; awaiting results -Patient is followed by oncology, biopsy results not available. -Decrease pain medication secondary to decreased cognition -2/16 Echocardiogram pending.  Patient with metastatic cancer -2/18 metastatic neuroendocrine tumor see results below.  Oncology on board, palliative care on board.   Son is an NP at [facility] ED and is leaning toward hospice given patient's continued decline.  Interstitial lung disease/COPD -Continuous pulse ox -Telemetry  Lactic acidosis -Most likely secondary to metastatic cancer -2/16 D5-0.9% saline 180ml/hr -2/17 increasing lactic acidosis bolus normal saline 196ml/hr -2/18 even with aggressive hydration lactic acidosis remains.  Dementia/Altered Mental Status -2/17 spoke with son and patient's baseline mental status not fully appreciated per son, has been slowly deteriorating .   -2/17 ammonia level; minimally elevated certainly not enough to account for patient's altered mental status.  -2/18 per palliative care recommendation will start modafinil, quality of life increase.  Severe suicidal thoughts -2/16 per psychiatry's note;Yesterday when I saw him he was still pretty delirious.  He could rouse himself at times to say that he did wish he was dead and had thoughts about shooting himself but was not able to stay awake long enough to have any more conversation about it and for the most part stay delirious.  -Psychiatry to evaluate but again given patient's poor mental status unlikely to yield any significant information.  Failure to thrive/Anorexia -Megace 400 mg daily -Minimize sedating medication -Encourage patient to eat. -Pending SNF placement  DM type II controlled without complication -5/27 hemoglobin A1c= 5.0 -Sensitive SSI  Elevated liver enzymes -Secondary to metastatic cancer.  This along with patient's increasing lactic acidosis despite aggressive treatment poor prognostic factor for survival   Goals of care -2/17 palliative care; 2/17 spoke NP [name] (son) spoke at length with son and he expressed wish that his father be comfortable and passed with dignity.  Would like palliative care to become involved discussed options. -2/18 SNF not a realistic option as patient unable  to participate in any meaningful rehab.  Now that we  have all all of the results from biopsy, and have attempted to improve patient's lactic acidosis and mental status decision on residential hospice will need to be finalized.  NP [name] very realistic   DVT prophylaxis:  Code Status: DNR Family Communication: 2/17 spoke RN [name] (son) spoke at length with son and he expressed wish that his father be comfortable and passed with dignity.  Would like palliative care to become involved discussed options. 2/17 Dr. Randa Evens (oncology) also spoke with RN [name] (son) about results of liver biopsy.  Aggressive tumor see results above Status is: Inpatient    Dispo: The patient is from: Home              Anticipated d/c is to: SNF vs Hospice              Anticipated d/c date is: 2/19              Patient currently extremely unstable      Consultants:  Psychiatry Oncology  Procedures/Significant Events:  2/14 LEFT liver lobe biopsy;POORLY DIFFERENTIATED CARCINOMA WITH NEUROENDOCRINE DIFFERENTIATION AND EXTENSIVE NECROSIS 2/16 Echocardiogram;Left Ventricle: LVEF=60 to 65%.-. Left ventricular diastolic function  could not be evaluated.    I have personally reviewed and interpreted all radiology studies and my findings are as above.  VENTILATOR SETTINGS: Room air 2/18 SPO2 97%   Cultures   Antimicrobials: Anti-infectives (From admission, onward)   None       Devices    LINES / TUBES:      Continuous Infusions: . dextrose 5 % and 0.9% NaCl 100 mL/hr at 01/10/21 0030     Objective: Vitals:   01/09/21 2003 01/10/21 0008 01/10/21 0421 01/10/21 0819  BP: (!) 143/69 125/70 126/67 137/73  Pulse: 89 82 81 81  Resp: 16 20 20 18   Temp: 98.4 F (36.9 C) (!) 97.5 F (36.4 C) 98.5 F (36.9 C) 98.6 F (37 C)  TempSrc:  Oral Oral Axillary  SpO2: 98% 99% 98% 97%  Weight:      Height:        Intake/Output Summary (Last 24 hours) at 01/10/2021 5093 Last data filed at 01/09/2021 1903 Gross per 24 hour  Intake 1078.58 ml   Output -  Net 1078.58 ml   Filed Weights   01/03/21 1104  Weight: 117.9 kg    Examination:  General: A/O x1 (does not know where, when, why), difficult to arouse, No acute respiratory distress Eyes: negative scleral hemorrhage, negative anisocoria, negative icterus ENT: Negative Runny nose, negative gingival bleeding, Neck:  Negative scars, masses, torticollis, lymphadenopathy, JVD Lungs: Clear to auscultation bilaterally without wheezes or crackles Cardiovascular: Regular rate and rhythm without murmur gallop or rub normal S1 and S2 Abdomen:  OBESE positive abdominal pain to palpation RUQ, nondistended, positive soft, bowel sounds, no rebound, no ascites, no appreciable mass Extremities: No significant cyanosis, clubbing, or edema bilateral lower extremities Skin: Negative rashes, lesions, ulcers Psychiatric: Unable to evaluate altered mental status Central nervous system: Minimally responsive to painful stimuli .     Data Reviewed: Care during the described time interval was provided by me .  I have reviewed this patient's available data, including medical history, events of note, physical examination, and all test results as part of my evaluation.  CBC: Recent Labs  Lab 01/03/21 1111 01/04/21 0604 01/05/21 0800 01/08/21 0500 01/09/21 0714 01/10/21 0550  WBC 9.1 8.7 9.4 9.9 9.7 12.3*  NEUTROABS  6.7  --   --  7.4 7.0 9.8*  HGB 15.2 12.8* 14.2 12.7* 12.5* 12.9*  HCT 47.0 39.6 44.4 39.8 39.2 39.8  MCV 91.1 92.1 93.1 93.4 94.2 93.4  PLT 230 197 185 166 164 175   Basic Metabolic Panel: Recent Labs  Lab 01/03/21 1111 01/04/21 0604 01/08/21 0500 01/09/21 0714 01/10/21 0550  NA 135 137 139 139 139  K 3.8 4.2 4.0 3.8 3.9  CL 96* 99 101 103 105  CO2 26 26 25 25 24   GLUCOSE 93 92 109* 117* 102*  BUN 10 13 21 19 15   CREATININE 0.90 0.89 1.09 0.93 0.77  CALCIUM 9.3 9.0 9.1 9.3 9.2  MG  --   --  2.5* 2.4 2.2  PHOS  --   --   --  2.9 3.2   GFR: Estimated Creatinine  Clearance: 100.9 mL/min (by C-G formula based on SCr of 0.77 mg/dL). Liver Function Tests: Recent Labs  Lab 01/03/21 1111 01/04/21 0604 01/09/21 0714 01/10/21 0550  AST 261* 229* 224* 198*  ALT 102* 83* 73* 69*  ALKPHOS 616* 528* 491* 477*  BILITOT 3.3* 2.7* 2.5* 2.1*  PROT 7.6 6.0* 6.2* 5.9*  ALBUMIN 3.4* 2.7* 2.5* 2.3*   Recent Labs  Lab 01/03/21 1111  LIPASE 45   Recent Labs  Lab 01/09/21 1322 01/10/21 0550  AMMONIA 42* 41*   Coagulation Profile: Recent Labs  Lab 01/03/21 1849 01/06/21 0947  INR 1.2 1.3*   Cardiac Enzymes: No results for input(s): CKTOTAL, CKMB, CKMBINDEX, TROPONINI in the last 168 hours. BNP (last 3 results) No results for input(s): PROBNP in the last 8760 hours. HbA1C: No results for input(s): HGBA1C in the last 72 hours. CBG: Recent Labs  Lab 01/09/21 1701 01/09/21 1946 01/10/21 0008 01/10/21 0421 01/10/21 0821  GLUCAP 132* 124* 106* 104* 96   Lipid Profile: No results for input(s): CHOL, HDL, LDLCALC, TRIG, CHOLHDL, LDLDIRECT in the last 72 hours. Thyroid Function Tests: No results for input(s): TSH, T4TOTAL, FREET4, T3FREE, THYROIDAB in the last 72 hours. Anemia Panel: No results for input(s): VITAMINB12, FOLATE, FERRITIN, TIBC, IRON, RETICCTPCT in the last 72 hours. Sepsis Labs: Recent Labs  Lab 01/08/21 1025 01/08/21 1214 01/09/21 0848 01/09/21 1116  LATICACIDVEN 3.7* 3.8* 4.1* 4.2*    Recent Results (from the past 240 hour(s))  Culture, blood (Routine X 2) w Reflex to ID Panel     Status: None   Collection Time: 01/03/21 11:11 AM   Specimen: BLOOD  Result Value Ref Range Status   Specimen Description BLOOD LEFT FOREARM  Final   Special Requests   Final    BOTTLES DRAWN AEROBIC AND ANAEROBIC Blood Culture adequate volume   Culture   Final    NO GROWTH 5 DAYS Performed at [facility], [address], [place], [place] [address]    Report Status 01/08/2021 FINAL  Final  Culture, blood (Routine X 2) w  Reflex to ID Panel     Status: None   Collection Time: 01/03/21 11:11 AM   Specimen: BLOOD  Result Value Ref Range Status   Specimen Description BLOOD LEFT [facility]  Final   Special Requests   Final    BOTTLES DRAWN AEROBIC AND ANAEROBIC Blood Culture adequate volume   Culture   Final    NO GROWTH 5 DAYS Performed at [facility], [address], [place], [place] [address]    Report Status 01/08/2021 FINAL  Final  Resp Panel by RT-PCR (Flu A&B, Covid) Nasopharyngeal  Swab     Status: None   Collection Time: 01/03/21  3:55 PM   Specimen: Nasopharyngeal Swab; Nasopharyngeal(NP) swabs in vial transport medium  Result Value Ref Range Status   SARS Coronavirus 2 by RT PCR NEGATIVE NEGATIVE Final    Comment: (NOTE) SARS-CoV-2 target nucleic acids are NOT DETECTED.  The SARS-CoV-2 RNA is generally detectable in upper respiratory specimens during the acute phase of infection. The lowest concentration of SARS-CoV-2 viral copies this assay can detect is 138 copies/mL. A negative result does not preclude SARS-Cov-2 infection and should not be used as the sole basis for treatment or other patient management decisions. A negative result may occur with  improper specimen collection/handling, submission of specimen other than nasopharyngeal swab, presence of viral mutation(s) within the areas targeted by this assay, and inadequate number of viral copies(<138 copies/mL). A negative result must be combined with clinical observations, patient history, and epidemiological information. The expected result is Negative.  Fact Sheet for Patients:  EntrepreneurPulse.com.au  Fact Sheet for Healthcare Providers:  IncredibleEmployment.be  This test is no t yet approved or cleared by the [place] FDA and  has been authorized for detection and/or diagnosis of SARS-CoV-2 by FDA under an Emergency Use Authorization (EUA). This EUA will remain  in effect (meaning  this test can be used) for the duration of the COVID-19 declaration under Section 564(b)(1) of the Act, 21 U.S.C.section 360bbb-3(b)(1), unless the authorization is terminated  or revoked sooner.       Influenza A by PCR NEGATIVE NEGATIVE Final   Influenza B by PCR NEGATIVE NEGATIVE Final    Comment: (NOTE) The Xpert Xpress SARS-CoV-2/FLU/RSV plus assay is intended as an aid in the diagnosis of influenza from Nasopharyngeal swab specimens and should not be used as a sole basis for treatment. Nasal washings and aspirates are unacceptable for Xpert Xpress SARS-CoV-2/FLU/RSV testing.  Fact Sheet for Patients: EntrepreneurPulse.com.au  Fact Sheet for Healthcare Providers: IncredibleEmployment.be  This test is not yet approved or cleared by the [place] FDA and has been authorized for detection and/or diagnosis of SARS-CoV-2 by FDA under an Emergency Use Authorization (EUA). This EUA will remain in effect (meaning this test can be used) for the duration of the COVID-19 declaration under Section 564(b)(1) of the Act, 21 U.S.C. section 360bbb-3(b)(1), unless the authorization is terminated or revoked.  Performed at [facility], [address] [place], [place] [address]   Urine Culture     Status: None   Collection Time: 01/03/21  4:23 PM   Specimen: Urine, Random  Result Value Ref Range Status   Specimen Description   Final    URINE, RANDOM Performed at [facility], [address], [place], [place] [address]    Special Requests   Final    NONE Performed at [facility], [address] [place], [place] [address]    Culture   Final    NO GROWTH Performed at [facility], [place] [address] [place], [place] [address]    Report Status 01/05/2021 FINAL  Final         Radiology Studies: ECHOCARDIOGRAM COMPLETE  Result Date: 01/08/2021    ECHOCARDIOGRAM REPORT   Patient Name:   TREYLEN GIBBS  Date of Exam: 01/08/2021 Medical Rec #:  [id number]      Height:       72.0 in Accession #:    [id number]     Weight:       260.0 lb Date  of Birth:  [birth year]       BSA:          2.382 m Patient Age:    21 years       BP:           123/58 mmHg Patient Gender: M              HR:           91 bpm. Exam Location:  [facility] Procedure: 2D Echo, Color Doppler and Cardiac Doppler Indications:     CHF-acute systolic K48.18  History:         Patient has no prior history of Echocardiogram examinations.                  Stroke, Signs/Symptoms:Shortness of Breath; Risk Factors:Sleep                  Apnea.  Sonographer:     Sherrie Sport RDCS (AE) Referring Phys:  [id number] Geraldo Docker Seona Clemenson Diagnosing Phys: Kate Sable MD  Sonographer Comments: No apical window, no subcostal window and Technically challenging study due to limited acoustic windows. IMPRESSIONS  1. Left ventricular ejection fraction, by estimation, is 60 to 65%. The left ventricle has normal function. The left ventricle has no regional wall motion abnormalities. There is mild left ventricular hypertrophy. Left ventricular diastolic function could not be evaluated.  2. Right ventricular systolic function is normal. The right ventricular size is not well visualized.  3. The mitral valve is degenerative. No evidence of mitral valve regurgitation. Moderate mitral annular calcification.  4. The aortic valve is normal in structure. Aortic valve regurgitation is not visualized.  5. Aortic dilatation noted. There is mild dilatation of the aortic root, measuring 39 mm. 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 FINDINGS  Left Ventricle: Left ventricular ejection fraction, by estimation, is 60 to 65%. The left ventricle has normal function. The left ventricle has no regional wall motion abnormalities. The left ventricular internal cavity size was normal in size. There is  mild left ventricular hypertrophy. Left ventricular diastolic function  could not be evaluated. Right Ventricle: The right ventricular size is not well visualized. No increase in right ventricular wall thickness. Right ventricular systolic function is normal. Left Atrium: Left atrial size was normal in size. Right Atrium: Right atrial size was not well visualized. Pericardium: There is no evidence of pericardial effusion. Mitral Valve: The mitral valve is degenerative in appearance. Moderate mitral annular calcification. No evidence of mitral valve regurgitation. Tricuspid Valve: The tricuspid valve is normal in structure. Tricuspid valve regurgitation is not demonstrated. Aortic Valve: The aortic valve is normal in structure. Aortic valve regurgitation is not visualized. Pulmonic Valve: The pulmonic valve was not well visualized. Pulmonic valve regurgitation is not visualized. Aorta: Aortic dilatation noted. There is mild dilatation of the aortic root, measuring 39 mm. 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 Venous: The inferior vena cava was not well visualized. IAS/Shunts: The interatrial septum was not well visualized.  LEFT VENTRICLE PLAX 2D LVIDd:         4.54 cm LVIDs:         2.62 cm LV PW:         1.45 cm LV IVS:        1.58 cm LVOT diam:     2.30 cm LVOT Area:     4.15 cm  LEFT ATRIUM  Index LA diam:    2.80 cm 1.18 cm/m                        PULMONIC VALVE AORTA                 PV Vmax:        0.48 m/s Ao Root diam: 4.13 cm PV Peak grad:   0.9 mmHg                       RVOT Peak grad: 2 mmHg   SHUNTS Systemic Diam: 2.30 cm Kate Sable MD Electronically signed by Kate Sable MD Signature Date/Time: 01/08/2021/3:58:28 PM    Final         Scheduled Meds: . cholecalciferol  1,000 Units Oral Daily  . donepezil  10 mg Oral QHS  . dutasteride  0.5 mg Oral Daily  . enoxaparin (LOVENOX) injection  0.5 mg/kg Subcutaneous Q24H  . feeding supplement  237 mL Oral TID BM  . fluticasone  1 spray Each Nare Daily  .  hydrOXYzine  20 mg Oral QHS  . insulin aspart  0-9 Units Subcutaneous Q4H  . levothyroxine  50 mcg Oral Q0600  . megestrol  400 mg Oral Daily  . melatonin  3 mg Oral QHS  . mometasone-formoterol  2 puff Inhalation BID  . multivitamin with minerals  1 tablet Oral Daily  . senna-docusate  2 tablet Oral BID  . tamsulosin  0.4 mg Oral Daily  . tiotropium  18 mcg Inhalation Daily  . topiramate  50 mg Oral BID  . vitamin B-12  1,000 mcg Oral Daily   Continuous Infusions: . dextrose 5 % and 0.9% NaCl 100 mL/hr at 01/10/21 0030     LOS: 7 days    Time spent:40 min    Elnore Cosens, Geraldo Docker, MD [facility]   If 7PM-7AM, please contact night-coverage 01/10/2021, 9:28 AM

## 2021-01-10 NOTE — Progress Notes (Signed)
OT Cancellation Note  Patient Details Name: Eddie Carter MRN: [id number] DOB: 1941/02/07   Cancelled Treatment:    Reason Eval/Treat Not Completed: Fatigue/lethargy limiting ability to participate. Today is third attempt made after initial evaluation to see pt in which he has declined or has been unable to participate in OT intervention secondary to medical concerns. Pt remains lethargic and unable to participate on this date. OT to SIGN OFF at this time. Please re-consult when medically appropriate and pt is able to actively participate in OT intervention.    Darleen Crocker, [place], OTR/L , CBIS ascom [phone]  01/10/21, 11:29 AM  01/10/2021, 11:27 AM

## 2021-01-10 NOTE — Consult Note (Signed)
Psychiatry: Brief note today.  I came by to see the patient in the evening but he was sound asleep so I did not wake him up.  I spoke with the palliative care team this morning.  They had had the idea of trying a wakefulness stimulant like modafinil to see if that helped him be more awake and alert during the day.  Modafinil is not thought of as an antidepressant but it can have some benefits to some symptoms associated with depression and there is some evidence that occasionally it can help as an adjunct.  Given the patient's bleak prognosis and low risk I think it is certainly worth trying.  I went ahead and put in an order myself for the 100 mg of modafinil.  I suggested that perhaps we try adding an antidepressant.so I also added 15mg  remeron.  I believe patient is likely to be discharged to another facility soon.  I am glad that palliative care is involved.  They have to deal with all these sorts of difficult issues and try and find ways to make peoples last days more tolerable.  I also talked with them about how it might be good if the whole family could be involved since there had recently been a lot of acrimony.  In any case I hope that these medicines will be of some help and if not something else can be found

## 2021-01-11 DIAGNOSIS — R410 Disorientation, unspecified: Secondary | ICD-10-CM | POA: Diagnosis not present

## 2021-01-11 LAB — CBC WITH DIFFERENTIAL/PLATELET
Abs Immature Granulocytes: 0.08 10*3/uL — ABNORMAL HIGH (ref 0.00–0.07)
Basophils Absolute: 0.1 10*3/uL (ref 0.0–0.1)
Basophils Relative: 1 %
Eosinophils Absolute: 0.2 10*3/uL (ref 0.0–0.5)
Eosinophils Relative: 2 %
HCT: 39.1 % (ref 39.0–52.0)
Hemoglobin: 12.5 g/dL — ABNORMAL LOW (ref 13.0–17.0)
Immature Granulocytes: 1 %
Lymphocytes Relative: 10 %
Lymphs Abs: 1.2 10*3/uL (ref 0.7–4.0)
MCH: 29.8 pg (ref 26.0–34.0)
MCHC: 32 g/dL (ref 30.0–36.0)
MCV: 93.1 fL (ref 80.0–100.0)
Monocytes Absolute: 0.9 10*3/uL (ref 0.1–1.0)
Monocytes Relative: 8 %
Neutro Abs: 9 10*3/uL — ABNORMAL HIGH (ref 1.7–7.7)
Neutrophils Relative %: 78 %
Platelets: 150 10*3/uL (ref 150–400)
RBC: 4.2 MIL/uL — ABNORMAL LOW (ref 4.22–5.81)
RDW: 18.6 % — ABNORMAL HIGH (ref 11.5–15.5)
WBC: 11.4 10*3/uL — ABNORMAL HIGH (ref 4.0–10.5)
nRBC: 0 % (ref 0.0–0.2)

## 2021-01-11 LAB — COMPREHENSIVE METABOLIC PANEL
ALT: 75 U/L — ABNORMAL HIGH (ref 0–44)
AST: 216 U/L — ABNORMAL HIGH (ref 15–41)
Albumin: 2.2 g/dL — ABNORMAL LOW (ref 3.5–5.0)
Alkaline Phosphatase: 445 U/L — ABNORMAL HIGH (ref 38–126)
Anion gap: 7 (ref 5–15)
BUN: 14 mg/dL (ref 8–23)
CO2: 25 mmol/L (ref 22–32)
Calcium: 8.8 mg/dL — ABNORMAL LOW (ref 8.9–10.3)
Chloride: 106 mmol/L (ref 98–111)
Creatinine, Ser: 0.73 mg/dL (ref 0.61–1.24)
GFR, Estimated: >60 mL/min (ref >60)
Glucose, Bld: 94 mg/dL (ref 70–99)
Potassium: 4 mmol/L (ref 3.5–5.1)
Sodium: 138 mmol/L (ref 135–145)
Total Bilirubin: 2.3 mg/dL — ABNORMAL HIGH (ref 0.3–1.2)
Total Protein: 5.8 g/dL — ABNORMAL LOW (ref 6.5–8.1)

## 2021-01-11 LAB — GLUCOSE, CAPILLARY
Glucose-Capillary: 102 mg/dL — ABNORMAL HIGH (ref 70–99)
Glucose-Capillary: 78 mg/dL (ref 70–99)
Glucose-Capillary: 81 mg/dL (ref 70–99)
Glucose-Capillary: 85 mg/dL (ref 70–99)
Glucose-Capillary: 93 mg/dL (ref 70–99)
Glucose-Capillary: 93 mg/dL (ref 70–99)
Glucose-Capillary: 97 mg/dL (ref 70–99)
Glucose-Capillary: 98 mg/dL (ref 70–99)

## 2021-01-11 LAB — MAGNESIUM: Magnesium: 2.2 mg/dL (ref 1.7–2.4)

## 2021-01-11 LAB — PHOSPHORUS: Phosphorus: 3 mg/dL (ref 2.5–4.6)

## 2021-01-11 LAB — AMMONIA: Ammonia: 49 umol/L — ABNORMAL HIGH (ref 9–35)

## 2021-01-11 NOTE — Progress Notes (Signed)
PROGRESS NOTE    Eddie Carter  NID:[id number] DOB: [birth year] DOA: 01/03/2021 PCP: [name], MD     Brief Narrative:  80 y.o.WM PMH Depression, Anxietyx DM type II, diet-controlled, COPD, Asthma, stroke, GERD, Hypothyroidism, , OSA on CPAP, IBS, urethral stricture, Diverticulitis, GI bleeding, BPH, Interstitial Lung disease, Dementia,   Presents with abdominal pain, chest pain, generalized weakness. CT abdomen/pelvis with contrast showed large left lower lobe mass with multiple liver metastasis.Liver biopsy performed on 2/14.   Subjective: Pt confirmed pain where asked; in abdomen, legs.  Nursing reported eating little, drinking some Ensure.   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 -Elevated liver function due to liver metastasis. -2/14 s/p liver biopsy; awaiting results -Patient is followed by oncology, biopsy results not available. -Decrease pain medication secondary to decreased cognition -2/16 Echocardiogram pending.  Patient with metastatic cancer -2/18 metastatic neuroendocrine tumor see results below.  Oncology on board, palliative care on board.  Son is an  NP at [facility] ED and is leaning toward hospice given patient's continued decline.  Interstitial lung disease/COPD --d/c cont pulse ox and tele  Lactic acidosis -Most likely secondary to metastatic cancer -2/16 D5-0.9% saline 140ml/hr -2/17 increasing lactic acidosis bolus normal saline 159ml/hr -2/18 even with aggressive hydration lactic acidosis remains.  Dementia/Altered Mental Status -2/17 spoke with son and patient's baseline mental status not fully appreciated per son, has been slowly deteriorating .   -2/17 ammonia level; minimally elevated certainly not enough to account for patient's altered mental status.  -2/18 per palliative care recommendation started modafinil, quality of life increase.  Severe suicidal thoughts -psych consulted and following  Failure to thrive/Anorexia -Megace 400 mg daily -Minimize sedating medication -Encourage patient to eat. --d/c MIVF  DM type II controlled without complication -1/44 hemoglobin A1c= 5.0 --BG have been wnl --d/c fingersticks  Elevated liver enzymes -Secondary to metastatic cancer.  This along with patient's increasing lactic acidosis despite aggressive treatment poor prognostic factor for survival   Goals of care -2/17 palliative care; 2/17 spoke NP [name] (son) spoke at length with son and he expressed wish that his father be comfortable and passed with dignity.  Would like palliative care to become involved discussed options. -2/18 SNF not a realistic option as patient unable to participate in any meaningful rehab.  Now that we have all all of the results from biopsy, and have attempted to improve patient's lactic acidosis and mental status decision on residential hospice will need to be finalized.  NP [name] very realistic --refer to hospice  DVT prophylaxis:  Code Status: DNR Family Communication: updated son on the phone today  Status is: Inpatient  Dispo: The patient is from:  Home              Anticipated d/c is  to: SNF vs Hospice              Anticipated d/c date is: undetermined, due to no safe discharge plan.              Consultants:  Psychiatry Oncology  Procedures/Significant Events:  2/14 LEFT liver lobe biopsy;POORLY DIFFERENTIATED CARCINOMA WITH NEUROENDOCRINE DIFFERENTIATION AND EXTENSIVE NECROSIS 2/16 Echocardiogram;Left Ventricle: LVEF=60 to 65%.-. Left ventricular diastolic function  could not be evaluated.     Cultures   Antimicrobials: Anti-infectives (From admission, onward)   None        Continuous Infusions:    Objective: Vitals:   01/11/21 1215 01/11/21 1552 01/11/21 1934 01/11/21 2331  BP: (!) 127/58 (!) 118/58 130/60 129/69  Pulse: 79 85 80 72  Resp: 16 18 16 20   Temp: 98.4 F (36.9 C) 98.2 F (36.8 C) 98.2 F (36.8 C) 97.8 F (36.6 C)  TempSrc:      SpO2: 99% 100% 100% 100%  Weight:      Height:        Intake/Output Summary (Last 24 hours) at 01/12/2021 0003 Last data filed at 01/11/2021 2345 Gross per 24 hour  Intake 2592.12 ml  Output 2 ml  Net 2590.12 ml   Filed Weights   01/03/21 1104  Weight: 117.9 kg    Examination:  Constitutional: NAD, alert, oriented to self and place HEENT: conjunctivae and lids normal, EOMI CV: No cyanosis.   RESP: No distress, on 2L GI: Abdomen soft Extremities: Swelling in BLE and both feet SKIN: warm, dry Neuro: II - XII grossly intact.   Psych: depressed mood and affect.     CBC: Recent Labs  Lab 01/05/21 0800 01/08/21 0500 01/09/21 0714 01/10/21 0550 01/11/21 0410  WBC 9.4 9.9 9.7 12.3* 11.4*  NEUTROABS  --  7.4 7.0 9.8* 9.0*  HGB 14.2 12.7* 12.5* 12.9* 12.5*  HCT 44.4 39.8 39.2 39.8 39.1  MCV 93.1 93.4 94.2 93.4 93.1  PLT 185 166 164 152 295   Basic Metabolic Panel: Recent Labs  Lab 01/08/21 0500 01/09/21 0714 01/10/21 0550 01/11/21 0410  NA 139 139 139 138  K 4.0 3.8 3.9 4.0  CL 101 103 105 106  CO2 25 25 24 25   GLUCOSE 109* 117* 102* 94  BUN 21 19 15 14   CREATININE 1.09  0.93 0.77 0.73  CALCIUM 9.1 9.3 9.2 8.8*  MG 2.5* 2.4 2.2 2.2  PHOS  --  2.9 3.2 3.0   GFR: Estimated Creatinine Clearance: 100.9 mL/min (by C-G formula based on SCr of 0.73 mg/dL). Liver Function Tests: Recent Labs  Lab 01/09/21 0714 01/10/21 0550 01/11/21 0410  AST 224* 198* 216*  ALT 73* 69* 75*  ALKPHOS 491* 477* 445*  BILITOT 2.5* 2.1* 2.3*  PROT 6.2* 5.9* 5.8*  ALBUMIN 2.5* 2.3* 2.2*   No results for input(s): LIPASE, AMYLASE in the last 168 hours. Recent Labs  Lab 01/09/21 1322 01/10/21 0550 01/11/21 0410  AMMONIA 42* 41* 49*   Coagulation Profile: Recent Labs  Lab 01/06/21 0947  INR 1.3*   Cardiac Enzymes: No results for input(s): CKTOTAL, CKMB, CKMBINDEX, TROPONINI in the last 168 hours. BNP (last 3 results) No results for input(s): PROBNP in the last 8760 hours. HbA1C: No results for input(s): HGBA1C in the last 72 hours. CBG: Recent Labs  Lab 01/11/21 1216 01/11/21 1600 01/11/21 1743 01/11/21 2009 01/11/21 2328  GLUCAP 97  98 85 93 78   Lipid Profile: No results for input(s): CHOL, HDL, LDLCALC, TRIG, CHOLHDL, LDLDIRECT in the last 72 hours. Thyroid Function Tests: No results for input(s): TSH, T4TOTAL, FREET4, T3FREE, THYROIDAB in the last 72 hours. Anemia Panel: No results for input(s): VITAMINB12, FOLATE, FERRITIN, TIBC, IRON, RETICCTPCT in the last 72 hours. Sepsis Labs: Recent Labs  Lab 01/09/21 0848 01/09/21 1116 01/10/21 0947 01/10/21 1601  LATICACIDVEN 4.1* 4.2* 4.2* 4.1*    Recent Results (from the past 240 hour(s))  Culture, blood (Routine X 2) w Reflex to ID Panel     Status: None   Collection Time: 01/03/21 11:11 AM   Specimen: BLOOD  Result Value Ref Range Status   Specimen Description BLOOD LEFT FOREARM  Final   Special Requests   Final    BOTTLES DRAWN AEROBIC AND ANAEROBIC Blood Culture adequate volume   Culture   Final    NO GROWTH 5 DAYS Performed at [facility], [address] [place], [place]  [address]    Report Status 01/08/2021 FINAL  Final  Culture, blood (Routine X 2) w Reflex to ID Panel     Status: None   Collection Time: 01/03/21 11:11 AM   Specimen: BLOOD  Result Value Ref Range Status   Specimen Description BLOOD LEFT AC  Final   Special Requests   Final    BOTTLES DRAWN AEROBIC AND ANAEROBIC Blood Culture adequate volume   Culture   Final    NO GROWTH 5 DAYS Performed at [facility], [address] [place], [place] [address]    Report Status 01/08/2021 FINAL  Final  Resp Panel by RT-PCR (Flu A&B, Covid) Nasopharyngeal Swab     Status: None   Collection Time: 01/03/21  3:55 PM   Specimen: Nasopharyngeal Swab; Nasopharyngeal(NP) swabs in vial transport medium  Result Value Ref Range Status   SARS Coronavirus 2 by RT PCR NEGATIVE NEGATIVE Final    Comment: (NOTE) SARS-CoV-2 target nucleic acids are NOT DETECTED.  The SARS-CoV-2 RNA is generally detectable in upper respiratory specimens during the acute phase of infection. The lowest concentration of SARS-CoV-2 viral copies this assay can detect is 138 copies/mL. A negative result does not preclude SARS-Cov-2 infection and should not be used as the sole basis for treatment or other patient management decisions. A negative result may occur with  improper specimen collection/handling, submission of specimen other than nasopharyngeal swab, presence of viral mutation(s) within the areas targeted by this assay, and inadequate number of viral copies(<138 copies/mL). A negative result must be combined with clinical observations, patient history, and epidemiological information. The expected result is Negative.  Fact Sheet for Patients:  EntrepreneurPulse.com.au  Fact Sheet for Healthcare Providers:  IncredibleEmployment.be  This test is no t yet approved or cleared by the [place] FDA and  has been authorized for detection and/or diagnosis of SARS-CoV-2 by FDA  under an Emergency Use Authorization (EUA). This EUA will remain  in effect (meaning this test can be used) for the duration of the COVID-19 declaration under Section 564(b)(1) of the Act, 21 U.S.C.section 360bbb-3(b)(1), unless the authorization is terminated  or revoked sooner.       Influenza A by PCR NEGATIVE NEGATIVE Final   Influenza B by PCR NEGATIVE NEGATIVE Final    Comment: (NOTE) The Xpert Xpress SARS-CoV-2/FLU/RSV plus assay is intended as an aid in the diagnosis of influenza from Nasopharyngeal swab specimens and should not be used as a sole basis for treatment. Nasal  washings and aspirates are unacceptable for Xpert Xpress SARS-CoV-2/FLU/RSV testing.  Fact Sheet for Patients: EntrepreneurPulse.com.au  Fact Sheet for Healthcare Providers: IncredibleEmployment.be  This test is not yet approved or cleared by the [place] FDA and has been authorized for detection and/or diagnosis of SARS-CoV-2 by FDA under an Emergency Use Authorization (EUA). This EUA will remain in effect (meaning this test can be used) for the duration of the COVID-19 declaration under Section 564(b)(1) of the Act, 21 U.S.C. section 360bbb-3(b)(1), unless the authorization is terminated or revoked.  Performed at [facility], [address] [place], [place] [address]   Urine Culture     Status: None   Collection Time: 01/03/21  4:23 PM   Specimen: Urine, Random  Result Value Ref Range Status   Specimen Description   Final    URINE, RANDOM Performed at [facility], [address] [place], [place] [address]    Special Requests   Final    NONE Performed at [facility], [address], [place], [place] [address]    Culture   Final    NO GROWTH Performed at [facility], [place] [address], [place], [place] [address]    Report Status 01/05/2021 FINAL  Final         Radiology Studies: No results  found.      Scheduled Meds: . cholecalciferol  1,000 Units Oral Daily  . donepezil  10 mg Oral QHS  . dutasteride  0.5 mg Oral Daily  . enoxaparin (LOVENOX) injection  0.5 mg/kg Subcutaneous Q24H  . feeding supplement  237 mL Oral TID BM  . fluticasone  1 spray Each Nare Daily  . hydrOXYzine  20 mg Oral QHS  . insulin aspart  0-9 Units Subcutaneous Q4H  . levothyroxine  50 mcg Oral Q0600  . megestrol  400 mg Oral Daily  . melatonin  3 mg Oral QHS  . mirtazapine  15 mg Oral QHS  . modafinil  100 mg Oral Daily  . mometasone-formoterol  2 puff Inhalation BID  . multivitamin with minerals  1 tablet Oral Daily  . senna-docusate  2 tablet Oral BID  . tamsulosin  0.4 mg Oral Daily  . tiotropium  18 mcg Inhalation Daily  . topiramate  50 mg Oral BID  . vitamin B-12  1,000 mcg Oral Daily   Continuous Infusions:    LOS: 9 days     Enzo Bi, MD [facility]   If 7PM-7AM, please contact night-coverage 01/12/2021, 12:03 AM

## 2021-01-11 NOTE — Progress Notes (Signed)
PT Cancellation Note  Patient Details Name: Eddie Carter MRN: [id number] DOB: 1941-01-23   Cancelled Treatment:    Reason Eval/Treat Not Completed: Fatigue/lethargy limiting ability to participate; per conversation with nursing pt remains very lethargic and is not appropriate to participate with PT services at this time.  Nursing agrees with plan to complete PT orders at this time secondary to multiple consecutive days of pt being unable to participate with therapy.  Will complete PT orders at this time but will reassess pt pending a change in status upon receipt of new PT orders.    Linus Salmons PT, DPT 01/11/21, 3:19 PM

## 2021-01-12 DIAGNOSIS — R410 Disorientation, unspecified: Secondary | ICD-10-CM | POA: Diagnosis not present

## 2021-01-12 LAB — BASIC METABOLIC PANEL
Anion gap: 9 (ref 5–15)
BUN: 14 mg/dL (ref 8–23)
CO2: 22 mmol/L (ref 22–32)
Calcium: 9 mg/dL (ref 8.9–10.3)
Chloride: 106 mmol/L (ref 98–111)
Creatinine, Ser: 0.76 mg/dL (ref 0.61–1.24)
GFR, Estimated: >60 mL/min (ref >60)
Glucose, Bld: 69 mg/dL — ABNORMAL LOW (ref 70–99)
Potassium: 4.1 mmol/L (ref 3.5–5.1)
Sodium: 137 mmol/L (ref 135–145)

## 2021-01-12 LAB — CBC
HCT: 38.8 % — ABNORMAL LOW (ref 39.0–52.0)
Hemoglobin: 12.6 g/dL — ABNORMAL LOW (ref 13.0–17.0)
MCH: 30.2 pg (ref 26.0–34.0)
MCHC: 32.5 g/dL (ref 30.0–36.0)
MCV: 93 fL (ref 80.0–100.0)
Platelets: 151 10*3/uL (ref 150–400)
RBC: 4.17 MIL/uL — ABNORMAL LOW (ref 4.22–5.81)
RDW: 18.5 % — ABNORMAL HIGH (ref 11.5–15.5)
WBC: 10.3 10*3/uL (ref 4.0–10.5)
nRBC: 0 % (ref 0.0–0.2)

## 2021-01-12 LAB — GLUCOSE, CAPILLARY
Glucose-Capillary: 67 mg/dL — ABNORMAL LOW (ref 70–99)
Glucose-Capillary: 85 mg/dL (ref 70–99)

## 2021-01-12 LAB — MAGNESIUM: Magnesium: 2.3 mg/dL (ref 1.7–2.4)

## 2021-01-12 NOTE — Progress Notes (Signed)
PROGRESS NOTE    Eddie Carter  WNU:[id number] DOB: [birth year] DOA: 01/03/2021 PCP: [name], MD     Brief Narrative:  80 y.o.WM PMH Depression, Anxietyx DM type II, diet-controlled, COPD, Asthma, stroke, GERD, Hypothyroidism, , OSA on CPAP, IBS, urethral stricture, Diverticulitis, GI bleeding, BPH, Interstitial Lung disease, Dementia,   Presents with abdominal pain, chest pain, generalized weakness. CT abdomen/pelvis with contrast showed large left lower lobe mass with multiple liver metastasis.Liver biopsy performed on 2/14.   Subjective: Pt noted to be in better mood today, making more conversation with staff.  Didn't want to eat food, but would drink fluids.  BG noted to be low.   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 -Elevated liver function due to liver metastasis. -2/14 s/p liver biopsy; awaiting results -Patient is followed by oncology, biopsy results not available. -Decrease pain medication secondary to decreased cognition -2/16 Echocardiogram pending.  Patient with metastatic cancer -2/18 metastatic neuroendocrine tumor see results below.  Oncology on  board, palliative care on board.  Son is an NP at [facility] ED and is leaning toward hospice given patient's continued decline. --hospice referral  Interstitial lung disease/COPD --not in exacerbation --cont Dulera and Spiriva  Lactic acidosis -Most likely secondary to metastatic cancer -2/16 D5-0.9% saline 160ml/hr -2/17 increasing lactic acidosis bolus normal saline 15ml/hr -2/18 even with aggressive hydration lactic acidosis remains.  Dementia/Altered Mental Status -2/17 spoke with son and patient's baseline mental status not fully appreciated per son, has been slowly deteriorating .   -2/17 ammonia level; minimally elevated certainly not enough to account for patient's altered mental status.  -2/18 per palliative care recommendation started modafinil, quality of life increase.  Severe suicidal thoughts Depression -psych consulted and following --cont Remeron, Modafinil  Failure to thrive/Anorexia -Megace 400 mg daily --cont Remeron -Minimize sedating medication -Encourage patient to eat.  DM type II controlled without complication --I3K= 5.0 --BG have been wnl --no need for SSI  Elevated liver enzymes -Secondary to metastatic cancer.  This along with patient's increasing lactic acidosis despite aggressive treatment poor prognostic factor for survival  BPH --cont home dutasteride and Flomax  Goals of care -2/17 palliative care; 2/17 spoke NP [name] (son) spoke at length with son and he expressed wish that his father be comfortable and passed with dignity.  Would like palliative care to become involved discussed options. -2/18 SNF not a realistic option as patient unable to participate in any meaningful rehab.  Now that we have all all of the results from biopsy, and have attempted to improve patient's lactic acidosis and mental status decision on residential hospice will need to be finalized.  NP [name] very realistic --refer to  hospice  DVT prophylaxis:  Code Status:  DNR Family Communication:  Status is: Inpatient  Dispo: The patient is from: Home              Anticipated d/c is to: SNF vs Hospice              Anticipated d/c date is: undetermined, due to no safe discharge plan.              Consultants:  Psychiatry Oncology  Procedures/Significant Events:  2/14 LEFT liver lobe biopsy;POORLY DIFFERENTIATED CARCINOMA WITH NEUROENDOCRINE DIFFERENTIATION AND EXTENSIVE NECROSIS 2/16 Echocardiogram;Left Ventricle: LVEF=60 to 65%.-. Left ventricular diastolic function  could not be evaluated.     Cultures   Antimicrobials: Anti-infectives (From admission, onward)   None        Continuous Infusions:    Objective: Vitals:   01/11/21 2331 01/12/21 0400 01/12/21 0842 01/12/21 1208  BP: 129/69 134/63 126/65 135/66  Pulse: 72 77 78 76  Resp: 20 18 18 16   Temp: 97.8 F (36.6 C) 97.7 F (36.5 C) 97.8 F (36.6 C) 97.8 F (36.6 C)  TempSrc:      SpO2: 100% 99% 98% 99%  Weight:      Height:        Intake/Output Summary (Last 24 hours) at 01/12/2021 1423 Last data filed at 01/12/2021 0032 Gross per 24 hour  Intake 1232.18 ml  Output 2 ml  Net 1230.18 ml   Filed Weights   01/03/21 1104  Weight: 117.9 kg    Examination:  Constitutional: NAD, alert, oriented to self and place HEENT: conjunctivae and lids normal, EOMI CV: No cyanosis.   RESP: normal respiratory effort, on RA Extremities: No effusions, edema in BLE SKIN: warm, dry Neuro: II - XII grossly intact.   Psych: better mood and affect.     CBC: Recent Labs  Lab 01/08/21 0500 01/09/21 0714 01/10/21 0550 01/11/21 0410 01/12/21 0338  WBC 9.9 9.7 12.3* 11.4* 10.3  NEUTROABS 7.4 7.0 9.8* 9.0*  --   HGB 12.7* 12.5* 12.9* 12.5* 12.6*  HCT 39.8 39.2 39.8 39.1 38.8*  MCV 93.4 94.2 93.4 93.1 93.0  PLT 166 164 152 150 024   Basic Metabolic Panel: Recent Labs  Lab 01/08/21 0500 01/09/21 0714 01/10/21 0550 01/11/21 0410 01/12/21 0338  NA 139 139 139 138 137   K 4.0 3.8 3.9 4.0 4.1  CL 101 103 105 106 106  CO2 25 25 24 25 22   GLUCOSE 109* 117* 102* 94 69*  BUN 21 19 15 14 14   CREATININE 1.09 0.93 0.77 0.73 0.76  CALCIUM 9.1 9.3 9.2 8.8* 9.0  MG 2.5* 2.4 2.2 2.2 2.3  PHOS  --  2.9 3.2 3.0  --    GFR: Estimated Creatinine Clearance: 100.9 mL/min (by C-G formula based on SCr of 0.76 mg/dL). Liver Function Tests: Recent Labs  Lab 01/09/21 0714 01/10/21 0550 01/11/21 0410  AST 224* 198* 216*  ALT 73* 69* 75*  ALKPHOS 491* 477* 445*  BILITOT 2.5* 2.1* 2.3*  PROT 6.2* 5.9* 5.8*  ALBUMIN 2.5* 2.3* 2.2*   No results for input(s): LIPASE, AMYLASE in the last 168 hours. Recent Labs  Lab 01/09/21 1322 01/10/21 0550 01/11/21 0410  AMMONIA 42* 41* 49*   Coagulation Profile: Recent Labs  Lab 01/06/21 0947  INR 1.3*   Cardiac Enzymes: No results for input(s): CKTOTAL, CKMB, CKMBINDEX, TROPONINI in the last 168 hours. BNP (last 3 results) No results for input(s): PROBNP in the last 8760 hours.  HbA1C: No results for input(s): HGBA1C in the last 72 hours. CBG: Recent Labs  Lab 01/11/21 1743 01/11/21 2009 01/11/21 2328 01/12/21 0540 01/12/21 0610  GLUCAP 85 93 78 67* 85   Lipid Profile: No results for input(s): CHOL, HDL, LDLCALC, TRIG, CHOLHDL, LDLDIRECT in the last 72 hours. Thyroid Function Tests: No results for input(s): TSH, T4TOTAL, FREET4, T3FREE, THYROIDAB in the last 72 hours. Anemia Panel: No results for input(s): VITAMINB12, FOLATE, FERRITIN, TIBC, IRON, RETICCTPCT in the last 72 hours. Sepsis Labs: Recent Labs  Lab 01/09/21 0848 01/09/21 1116 01/10/21 0947 01/10/21 1601  LATICACIDVEN 4.1* 4.2* 4.2* 4.1*    Recent Results (from the past 240 hour(s))  Culture, blood (Routine X 2) w Reflex to ID Panel     Status: None   Collection Time: 01/03/21 11:11 AM   Specimen: BLOOD  Result Value Ref Range Status   Specimen Description BLOOD LEFT FOREARM  Final   Special Requests   Final    BOTTLES DRAWN AEROBIC  AND ANAEROBIC Blood Culture adequate volume   Culture   Final    NO GROWTH 5 DAYS Performed at [facility], [address] [place], [place] [address]    Report Status 01/08/2021 FINAL  Final  Culture, blood (Routine X 2) w Reflex to ID Panel     Status: None   Collection Time: 01/03/21 11:11 AM   Specimen: BLOOD  Result Value Ref Range Status   Specimen Description BLOOD LEFT AC  Final   Special Requests   Final    BOTTLES DRAWN AEROBIC AND ANAEROBIC Blood Culture adequate volume   Culture   Final    NO GROWTH 5 DAYS Performed at [facility], [address] [place], [place] [address]    Report Status 01/08/2021 FINAL  Final  Resp Panel by RT-PCR (Flu A&B, Covid) Nasopharyngeal Swab     Status: None   Collection Time: 01/03/21  3:55 PM   Specimen: Nasopharyngeal Swab; Nasopharyngeal(NP) swabs in vial transport medium  Result Value Ref Range Status   SARS Coronavirus 2 by RT PCR NEGATIVE NEGATIVE Final    Comment: (NOTE) SARS-CoV-2 target nucleic acids are NOT DETECTED.  The SARS-CoV-2 RNA is generally detectable in upper respiratory specimens during the acute phase of infection. The lowest concentration of SARS-CoV-2 viral copies this assay can detect is 138 copies/mL. A negative result does not preclude SARS-Cov-2 infection and should not be used as the sole basis for treatment or other patient management decisions. A negative result may occur with  improper specimen collection/handling, submission of specimen other than nasopharyngeal swab, presence of viral mutation(s) within the areas targeted by this assay, and inadequate number of viral copies(<138 copies/mL). A negative result must be combined with clinical observations, patient history, and epidemiological information. The expected result is Negative.  Fact Sheet for Patients:  EntrepreneurPulse.com.au  Fact Sheet for Healthcare Providers:   IncredibleEmployment.be  This test is no t yet approved or cleared by the [place] FDA and  has been authorized for detection and/or diagnosis of SARS-CoV-2 by FDA under an Emergency Use Authorization (EUA). This EUA will remain  in effect (meaning this test can be used) for the duration of the COVID-19 declaration under Section 564(b)(1) of the Act, 21 U.S.C.section 360bbb-3(b)(1), unless the authorization is terminated  or revoked sooner.       Influenza A by PCR NEGATIVE NEGATIVE Final   Influenza B by PCR NEGATIVE NEGATIVE Final    Comment: (NOTE) The Xpert Xpress  SARS-CoV-2/FLU/RSV plus assay is intended as an aid in the diagnosis of influenza from Nasopharyngeal swab specimens and should not be used as a sole basis for treatment. Nasal washings and aspirates are unacceptable for Xpert Xpress SARS-CoV-2/FLU/RSV testing.  Fact Sheet for Patients: EntrepreneurPulse.com.au  Fact Sheet for Healthcare Providers: IncredibleEmployment.be  This test is not yet approved or cleared by the [place] FDA and has been authorized for detection and/or diagnosis of SARS-CoV-2 by FDA under an Emergency Use Authorization (EUA). This EUA will remain in effect (meaning this test can be used) for the duration of the COVID-19 declaration under Section 564(b)(1) of the Act, 21 U.S.C. section 360bbb-3(b)(1), unless the authorization is terminated or revoked.  Performed at [facility], [address] [place], [place] [address]   Urine Culture     Status: None   Collection Time: 01/03/21  4:23 PM   Specimen: Urine, Random  Result Value Ref Range Status   Specimen Description   Final    URINE, RANDOM Performed at [facility], [address] [place], [place] [address]    Special Requests   Final    NONE Performed at [facility], [address] [place], [place] [address]    Culture   Final     NO GROWTH Performed at [facility], [place] [address] [place], [place] [address]    Report Status 01/05/2021 FINAL  Final         Radiology Studies: No results found.      Scheduled Meds: . cholecalciferol  1,000 Units Oral Daily  . donepezil  10 mg Oral QHS  . dutasteride  0.5 mg Oral Daily  . enoxaparin (LOVENOX) injection  0.5 mg/kg Subcutaneous Q24H  . feeding supplement  237 mL Oral TID BM  . fluticasone  1 spray Each Nare Daily  . hydrOXYzine  20 mg Oral QHS  . levothyroxine  50 mcg Oral Q0600  . megestrol  400 mg Oral Daily  . melatonin  3 mg Oral QHS  . mirtazapine  15 mg Oral QHS  . modafinil  100 mg Oral Daily  . mometasone-formoterol  2 puff Inhalation BID  . multivitamin with minerals  1 tablet Oral Daily  . senna-docusate  2 tablet Oral BID  . tamsulosin  0.4 mg Oral Daily  . tiotropium  18 mcg Inhalation Daily  . topiramate  50 mg Oral BID  . vitamin B-12  1,000 mcg Oral Daily   Continuous Infusions:    LOS: 9 days     Enzo Bi, MD [facility]   If 7PM-7AM, please contact night-coverage 01/12/2021, 2:23 PM

## 2021-01-12 NOTE — Progress Notes (Signed)
Hypoglycemic Event  CBG: 67 at 0540  Treatment: 4 oz orange juice  Symptoms: asymptomatic  Follow-up CBG: Time: 0610 CBG Result:85  Possible Reasons for Event: pt with poor PO intake; IVF D5NS at 100 ml/hr previously discontinued by Dr Billie Ruddy on 01/12/21 at [place]  Comments/MD notified: serum glucose level = 69 at 0338 on 01/12/21    [place]

## 2021-01-13 DIAGNOSIS — R410 Disorientation, unspecified: Secondary | ICD-10-CM | POA: Diagnosis not present

## 2021-01-13 LAB — CBC
HCT: 40 % (ref 39.0–52.0)
Hemoglobin: 13.2 g/dL (ref 13.0–17.0)
MCH: 30.6 pg (ref 26.0–34.0)
MCHC: 33 g/dL (ref 30.0–36.0)
MCV: 92.6 fL (ref 80.0–100.0)
Platelets: 179 10*3/uL (ref 150–400)
RBC: 4.32 MIL/uL (ref 4.22–5.81)
RDW: 18.4 % — ABNORMAL HIGH (ref 11.5–15.5)
WBC: 10.3 10*3/uL (ref 4.0–10.5)
nRBC: 0 % (ref 0.0–0.2)

## 2021-01-13 LAB — BASIC METABOLIC PANEL
Anion gap: 10 (ref 5–15)
BUN: 16 mg/dL (ref 8–23)
CO2: 22 mmol/L (ref 22–32)
Calcium: 8.9 mg/dL (ref 8.9–10.3)
Chloride: 104 mmol/L (ref 98–111)
Creatinine, Ser: 0.74 mg/dL (ref 0.61–1.24)
GFR, Estimated: >60 mL/min (ref >60)
Glucose, Bld: 63 mg/dL — ABNORMAL LOW (ref 70–99)
Potassium: 4.2 mmol/L (ref 3.5–5.1)
Sodium: 136 mmol/L (ref 135–145)

## 2021-01-13 LAB — MAGNESIUM: Magnesium: 2.3 mg/dL (ref 1.7–2.4)

## 2021-01-13 LAB — GLUCOSE, CAPILLARY
Glucose-Capillary: 82 mg/dL (ref 70–99)
Glucose-Capillary: 79 mg/dL (ref 70–99)
Glucose-Capillary: 81 mg/dL (ref 70–99)

## 2021-01-13 MED ORDER — ACETAMINOPHEN 500 MG PO TABS
1000.0000 mg | ORAL_TABLET | Freq: Three times a day (TID) | ORAL | Status: DC | PRN
  Administered 2021-01-15 – 2021-01-16 (×2): 1000 mg via ORAL
  Filled 2021-01-13 (×2): qty 2

## 2021-01-13 NOTE — TOC Progression Note (Signed)
Transition of Care [facility]) - Progression Note    Patient Details  Name: Eddie Carter MRN: [id number] Date of Birth: November 28, 1940  Transition of Care [facility]) CM/SW Contact  Shelbie Hutching, RN Phone Number: 01/13/2021, 10:04 AM  Clinical Narrative:    Patient's son [name] has decided on residential hospice.  [facility] to [facility].     Expected Discharge Plan: Hospice Medical Facility Barriers to Discharge: Other (comment) (referral sent to Hospice home)  Expected Discharge Plan and Services Expected Discharge Plan: [place]   Discharge Planning Services: CM Consult Post Acute Care Choice: Hospice Living arrangements for the past 2 months: Single Family Home                 DME Arranged: N/A         HH Arranged: NA           Social Determinants of Health (SDOH) Interventions    Readmission Risk Interventions No flowsheet data found.

## 2021-01-13 NOTE — TOC Progression Note (Signed)
Transition of Care [facility]) - Progression Note    Patient Details  Name: Eddie Carter MRN: [id number] Date of Birth: 1941-09-22  [facility] CM/SW Contact  Shelbie Hutching, RN Phone Number: 01/13/2021, 1:40 PM  Clinical Narrative:    Message received from the MD that patient's son does not want to pursue hospice facility at this time.  Hospice referral canceled with [facility].   Expected Discharge Plan: Hospice Medical Facility Barriers to Discharge: Other (comment) (referral sent to Hospice home)  Expected Discharge Plan and Services Expected Discharge Plan: [place]   Discharge Planning Services: CM Consult Post Acute Care Choice: Hospice Living arrangements for the past 2 months: Single Family Home                 DME Arranged: N/A         HH Arranged: NA           Social Determinants of Health (SDOH) Interventions    Readmission Risk Interventions No flowsheet data found.

## 2021-01-13 NOTE — Progress Notes (Addendum)
PROGRESS NOTE    Eddie Carter  WFU:[id number] DOB: Jul 15, 1941 DOA: 01/03/2021 PCP: [name], MD     Brief Narrative:  80 y.o.WM PMH Depression, Anxietyx DM type II, diet-controlled, COPD, Asthma, stroke, GERD, Hypothyroidism, , OSA on CPAP, IBS, urethral stricture, Diverticulitis, GI bleeding, BPH, Interstitial Lung disease, Dementia,   Presents with abdominal pain, chest pain, generalized weakness. CT abdomen/pelvis with contrast showed large left lower lobe mass with multiple liver metastasis.Liver biopsy performed on 2/14.   Subjective: Pt was back to being lethargic and not very responsive again today.  BG low, not eating much.  Health care POA son [name] changed goals of care today and wanted to everything done to keep pt alive.  Hospice referral therefore canceled.   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 -Elevated liver function due to liver metastasis. -2/14 s/p liver biopsy; awaiting results -Patient is followed by oncology, biopsy results not available. -Decrease pain medication secondary to decreased cognition -2/16 Echocardiogram pending.  Patient with  metastatic cancer -2/18 metastatic neuroendocrine tumor see results below.  Oncology on board, palliative care on board.  Son is an NP at [facility] ED  --Health care POA son [name] changed goals of care today and wanted to everything done to keep pt alive.  Hospice referral therefore canceled.  Interstitial lung disease/COPD --not in exacerbation --cont Dulera and Spiriva  Lactic acidosis -Most likely secondary to metastatic cancer -2/16 D5-0.9% saline 137ml/hr -2/17 increasing lactic acidosis bolus normal saline 189ml/hr -2/18 even with aggressive hydration lactic acidosis remains. --hold further IVF   Dementia/Altered Mental Status -2/17 spoke with son and patient's baseline mental status not fully appreciated per son, has been slowly deteriorating .   -2/17 ammonia level; minimally elevated certainly not enough to account for patient's altered mental status.  -2/18 per palliative care recommendation started modafinil --d/c oxycodone, per son's request, to attempt to improve pt's somnolence  Severe suicidal thoughts Depression -psych consulted and following --cont Remeron, Modafinil  Failure to thrive/Anorexia Hypoglycemia -Megace 400 mg daily --cont Remeron --BG checks ACHS to monitor for hypoglycemia  DM type II controlled without complication --K0U= 5.0 --BG have been wnl --no need for SSI  Elevated liver enzymes -Secondary to metastatic cancer.  This along with patient's increasing lactic acidosis despite aggressive treatment poor prognostic factor for survival  BPH --cont home dutasteride and Flomax  Goals of care -2/17 palliative care; 2/17 spoke NP [name] (son) spoke at length with son and he expressed wish that his father be comfortable and passed with dignity.  Would like palliative care to become involved discussed options. -2/18 SNF not a realistic option as patient unable to participate in any meaningful  rehab.  Now that we have all all of the results from  biopsy, and have attempted to improve patient's lactic acidosis and mental status decision on residential hospice will need to be finalized.   --Health care POA son [name] changed goals of care today and wanted to everything done to keep pt alive.  Hospice referral therefore canceled.   DVT prophylaxis:  Code Status: DNR Family Communication:  Status is: Inpatient  Dispo: The patient is from: Home              Anticipated d/c is to: SNF               Anticipated d/c date is: undetermined, due to no safe discharge plan.              Consultants:  Psychiatry Oncology  Procedures/Significant Events:  2/14 LEFT liver lobe biopsy;POORLY DIFFERENTIATED CARCINOMA WITH NEUROENDOCRINE DIFFERENTIATION AND EXTENSIVE NECROSIS 2/16 Echocardiogram;Left Ventricle: LVEF=60 to 65%.-. Left ventricular diastolic function  could not be evaluated.     Cultures   Antimicrobials: Anti-infectives (From admission, onward)   None        Continuous Infusions:    Objective: Vitals:   01/13/21 0451 01/13/21 0802 01/13/21 1136 01/13/21 1543  BP: 123/69 131/66 (!) 105/51 (!) 116/56  Pulse: 78 81 83 87  Resp: 18 17 18 20   Temp: 98.4 F (36.9 C) 98.2 F (36.8 C) 98.6 F (37 C) 99 F (37.2 C)  TempSrc:      SpO2: 97% 96% 96% 97%  Weight:      Height:        Intake/Output Summary (Last 24 hours) at 01/13/2021 1626 Last data filed at 01/13/2021 0644 Gross per 24 hour  Intake 120 ml  Output --  Net 120 ml   Filed Weights   01/03/21 1104  Weight: 117.9 kg    Examination:  Constitutional: NAD, somnolent, keeping eyes closed, not very responsive to questions CV: No cyanosis.   RESP: normal respiratory effort, on RA Extremities: swelling in both lower legs and feet SKIN: warm, dry, with venous stasis changes in BLE Psych: depressed mood and affect.     CBC: Recent Labs  Lab 01/08/21 0500 01/09/21 0714 01/10/21 0550 01/11/21 0410 01/12/21 0338 01/13/21 0441  WBC 9.9 9.7  12.3* 11.4* 10.3 10.3  NEUTROABS 7.4 7.0 9.8* 9.0*  --   --   HGB 12.7* 12.5* 12.9* 12.5* 12.6* 13.2  HCT 39.8 39.2 39.8 39.1 38.8* 40.0  MCV 93.4 94.2 93.4 93.1 93.0 92.6  PLT 166 164 152 150 151 970   Basic Metabolic Panel: Recent Labs  Lab 01/09/21 0714 01/10/21 0550 01/11/21 0410 01/12/21 0338 01/13/21 0441  NA 139 139 138 137 136  K 3.8 3.9 4.0 4.1 4.2  CL 103 105 106 106 104  CO2 25 24 25 22 22   GLUCOSE 117* 102* 94 69* 63*  BUN 19 15 14 14 16   CREATININE 0.93 0.77 0.73 0.76 0.74  CALCIUM 9.3 9.2 8.8* 9.0 8.9  MG 2.4 2.2 2.2 2.3 2.3  PHOS 2.9 3.2 3.0  --   --    GFR: Estimated Creatinine Clearance: 100.9 mL/min (by C-G formula based on SCr of 0.74 mg/dL). Liver Function Tests: Recent Labs  Lab 01/09/21 0714 01/10/21 0550 01/11/21 0410  AST 224* 198* 216*  ALT 73* 69* 75*  ALKPHOS 491* 477* 445*  BILITOT 2.5* 2.1* 2.3*  PROT 6.2* 5.9* 5.8*  ALBUMIN 2.5* 2.3* 2.2*   No results for input(s):  LIPASE, AMYLASE in the last 168 hours. Recent Labs  Lab 01/09/21 1322 01/10/21 0550 01/11/21 0410  AMMONIA 42* 41* 49*   Coagulation Profile: No results for input(s): INR, PROTIME in the last 168 hours. Cardiac Enzymes: No results for input(s): CKTOTAL, CKMB, CKMBINDEX, TROPONINI in the last 168 hours. BNP (last 3 results) No results for input(s): PROBNP in the last 8760 hours. HbA1C: No results for input(s): HGBA1C in the last 72 hours. CBG: Recent Labs  Lab 01/11/21 2328 01/12/21 0540 01/12/21 0610 01/13/21 0724 01/13/21 1545  GLUCAP 78 67* 85 82 79   Lipid Profile: No results for input(s): CHOL, HDL, LDLCALC, TRIG, CHOLHDL, LDLDIRECT in the last 72 hours. Thyroid Function Tests: No results for input(s): TSH, T4TOTAL, FREET4, T3FREE, THYROIDAB in the last 72 hours. Anemia Panel: No results for input(s): VITAMINB12, FOLATE, FERRITIN, TIBC, IRON, RETICCTPCT in the last 72 hours. Sepsis Labs: Recent Labs  Lab 01/09/21 0848 01/09/21 1116 01/10/21 0947  01/10/21 1601  LATICACIDVEN 4.1* 4.2* 4.2* 4.1*    No results found for this or any previous visit (from the past 240 hour(s)).       Radiology Studies: No results found.      Scheduled Meds: . cholecalciferol  1,000 Units Oral Daily  . donepezil  10 mg Oral QHS  . dutasteride  0.5 mg Oral Daily  . enoxaparin (LOVENOX) injection  0.5 mg/kg Subcutaneous Q24H  . feeding supplement  237 mL Oral TID BM  . fluticasone  1 spray Each Nare Daily  . hydrOXYzine  20 mg Oral QHS  . levothyroxine  50 mcg Oral Q0600  . megestrol  400 mg Oral Daily  . melatonin  3 mg Oral QHS  . mirtazapine  15 mg Oral QHS  . modafinil  100 mg Oral Daily  . mometasone-formoterol  2 puff Inhalation BID  . multivitamin with minerals  1 tablet Oral Daily  . senna-docusate  2 tablet Oral BID  . tamsulosin  0.4 mg Oral Daily  . tiotropium  18 mcg Inhalation Daily  . topiramate  50 mg Oral BID  . vitamin B-12  1,000 mcg Oral Daily   Continuous Infusions:    LOS: 10 days     Enzo Bi, MD [facility]   If 7PM-7AM, please contact night-coverage 01/13/2021, 4:26 PM

## 2021-01-13 NOTE — Consult Note (Signed)
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 male patient admitted with did not respond.  HPI: Patient no better than last time I saw him.  Not responding verbally.  Nursing states he has not been coherent at all today.  Largely passive.  Not acting out aggressive mostly just sedated seeming  Past Psychiatric History: None really  Risk to Self:   Risk to Others:   Prior Inpatient Therapy:   Prior Outpatient Therapy:    Past Medical History:  Past Medical History:  Diagnosis Date  . Arthritis   . Asthma   . BPH (benign prostatic hyperplasia)   . 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 Complication of anesthesia    after septoplasty and uvulectomy-was in icu- [facility] - 10 yrs. ago  . COPD (chronic obstructive pulmonary disease) ([place])   . Depression   . Diabetes mellitus without complication ([place])   . Diverticulitis   . ED (erectile dysfunction)   . Full dentures   . GERD (gastroesophageal reflux disease)    no meds  . Headache    difficulty  with headaches- 1950's , again in 1967, 2008- again problems with migrraines   . History of GI bleed   . History of multiple pulmonary nodules   . History of shingles   . History of urethral stricture   . HOH (hard of hearing)   . HOH (hard of hearing)   . Hypogonadism in male   . IBS (irritable bowel syndrome)   . Joint pain   . Medial meniscus, posterior horn derangement    left knee  . Melanosis coli   . Obesity   . Pneumonia 2014   [facility]  . Shortness of breath   . Skin cancer   . Sleep apnea    uses a cpap, most nights   . Stroke [facility]) 1995   some speech and thought processes slow  . Traumatic tear of lateral meniscus of right knee    right knee  . Urinary frequency   . Urinary incontinence     Past Surgical History:  Procedure Laterality Date  . APPENDECTOMY  1963  . BACK SURGERY    . CARPAL TUNNEL RELEASE     left  . CATARACT EXTRACTION W/PHACO Left 09/13/2018   Procedure: CATARACT EXTRACTION PHACO AND INTRAOCULAR LENS PLACEMENT (IOC);  Surgeon: Birder Robson, MD;  Location: [facility] ORS;  Service: Ophthalmology;  Laterality: Left;  [place]  00:42 CDE 6.73 Fluid pack lot #[id number]  . CATARACT EXTRACTION W/PHACO Right 11/23/2019  Procedure: CATARACT EXTRACTION PHACO AND INTRAOCULAR LENS PLACEMENT ([place]) RIGHT;  Surgeon: Birder Robson, MD;  Location: [facility] ORS;  Service: Ophthalmology;  Laterality: Right;  [place] 01:10.1 CDE 10.25 Fluid Pack Lot # [id number]  . COLONOSCOPY    . COLONOSCOPY WITH PROPOFOL N/A 12/07/2016   Procedure: COLONOSCOPY WITH PROPOFOL;  Surgeon: Lollie Sails, MD;  Location: [facility] ENDOSCOPY;  Service: Endoscopy;  Laterality: N/A;  . COLONOSCOPY WITH PROPOFOL N/A 06/26/2019   Procedure: COLONOSCOPY WITH PROPOFOL;  Surgeon: Lollie Sails, MD;  Location: [facility] ENDOSCOPY;  Service: Endoscopy;  Laterality: N/A;  . EYE SURGERY     foreign body removed fr. eye, ? side   . FOOT FOREIGN BODY REMOVAL  1976   left foot -multiple pieces glass  .  HEMORROIDECTOMY  1995  . KNEE ARTHROSCOPY Bilateral 01/17/2013   Procedure: ARTHROSCOPY KNEE BILATERAL WITH MEDIAL AND LATERAL MENISECTOMIES;  Surgeon: Lorn Junes, MD;  Location: [place];  Service: Orthopedics;  Laterality: Bilateral;  . KYPHOPLASTY N/A 07/23/2017   Procedure: KYPHOPLASTY- L1;  Surgeon: Hessie Knows, MD;  Location: [facility] ORS;  Service: Orthopedics;  Laterality: N/A;  . LUMBAR LAMINECTOMY N/A 11/05/2014   Procedure: LEFT L3-4 MICRODISCECTOMY;  Surgeon: Jessy Oto, MD;  Location: [place];  Service: Orthopedics;  Laterality: N/A;  . LUMBAR LAMINECTOMY/DECOMPRESSION MICRODISCECTOMY N/A 02/12/2015   Procedure: RE-DO LEFT L3-4 MICRODISCECTOMY;  Surgeon: Jessy Oto, MD;  Location: [place];  Service: Orthopedics;  Laterality: N/A;  . NASAL SEPTUM SURGERY  1995  . TONSILLECTOMY    . UPPER GI ENDOSCOPY    . UVULOPALATOPHARYNGOPLASTY (UPPP)/TONSILLECTOMY/SEPTOPLASTY  1995   same time with septoplasty-ended up ICU almost trached   Family History:  Family History  Problem Relation Age of Onset  . Cancer Mother   . Heart attack Father   . Heart disease Father   . Heart disease Other   . Arthritis Other   . Prostate cancer Neg Hx   . Kidney disease Neg Hx    Family Psychiatric  History: See previous Social History:  Social History   Substance and Sexual Activity  Alcohol Use No   Comment: wine in a month     Social History   Substance and Sexual Activity  Drug Use No    Social History   Socioeconomic History  . Marital status: Married    Spouse name: Mardene Celeste  . Number of children: 2  . Years of education: 62  . Highest education level: Not on file  Occupational History    Comment: register of deeds  Tobacco Use  . Smoking status: Former Smoker    Packs/day: 1.50    Years: 40.00    Pack years: 60.00    Types: Cigarettes    Quit date: 01/14/1996    Years since quitting: 25.0  . Smokeless tobacco: Never Used  Vaping Use  . Vaping Use:  Never used  Substance and Sexual Activity  . Alcohol use: No    Comment: wine in a month  . Drug use: No  . Sexual activity: Never  Other Topics Concern  . Not on file  Social History Narrative   Lives at home with wife, son   caffeine use - 2 cups coffee daily, sodas 1 a day, occas tea   Social Determinants of Health   Financial Resource Strain: Not on file  Food Insecurity: Not on file  Transportation Needs: Not on file  Physical Activity: Not on file  Stress: Not on file  Social Connections: Not on file   Additional Social History:    Allergies:   Allergies  Allergen Reactions  . Asa [Aspirin] Shortness Of Breath  . Bee Venom Anaphylaxis  . Nsaids Other (See Comments)    Makes him bleed.  . Tolmetin Other (See Comments)    Makes him bleed.    Labs:  Results for orders placed or performed during the hospital encounter of 01/03/21 (from the past 48 hour(s))  Glucose, capillary     Status: None   Collection Time: 01/11/21  8:09 PM  Result Value Ref Range   Glucose-Capillary 93 70 - 99 mg/dL    Comment: Glucose reference range applies only to samples taken after fasting for at least 8 hours.  Glucose, capillary     Status: None   Collection Time: 01/11/21 11:28 PM  Result Value Ref Range   Glucose-Capillary 78 70 - 99 mg/dL    Comment: Glucose reference range applies only to samples taken after fasting for at least 8 hours.  Magnesium     Status: None   Collection Time: 01/12/21  3:38 AM  Result Value Ref Range   Magnesium 2.3 1.7 - 2.4 mg/dL    Comment: Performed at [facility], [address] [place], [place] [address]  Basic metabolic panel     Status: Abnormal   Collection Time: 01/12/21  3:38 AM  Result Value Ref Range   Sodium 137 135 - 145 mmol/L   Potassium 4.1 3.5 - 5.1 mmol/L   Chloride 106 98 - 111 mmol/L   CO2 22 22 - 32 mmol/L   Glucose, Bld 69 (L) 70 - 99 mg/dL    Comment: Glucose reference range applies only to samples taken after  fasting for at least 8 hours.   BUN 14 8 - 23 mg/dL   Creatinine, Ser 0.76 0.61 - 1.24 mg/dL   Calcium 9.0 8.9 - 10.3 mg/dL   GFR, Estimated >60 >60 mL/min    Comment: (NOTE) Calculated using the CKD-EPI Creatinine Equation (2021)    Anion gap 9 5 - 15    Comment: Performed at [facility], [address] [place], [place] [address]  CBC     Status: Abnormal   Collection Time: 01/12/21  3:38 AM  Result Value Ref Range   WBC 10.3 4.0 - 10.5 K/uL   RBC 4.17 (L) 4.22 - 5.81 MIL/uL   Hemoglobin 12.6 (L) 13.0 - 17.0 g/dL   HCT 38.8 (L) 39.0 - 52.0 %   MCV 93.0 80.0 - 100.0 fL   MCH 30.2 26.0 - 34.0 pg   MCHC 32.5 30.0 - 36.0 g/dL   RDW 18.5 (H) 11.5 - 15.5 %   Platelets 151 150 - 400 K/uL   nRBC 0.0 0.0 - 0.2 %    Comment: Performed at [facility], [address] [place], [place] [address]  Glucose, capillary     Status: Abnormal   Collection Time: 01/12/21  5:40 AM  Result Value Ref Range   Glucose-Capillary 67 (L) 70 - 99 mg/dL    Comment: Glucose reference range applies only to samples taken after fasting for at least 8 hours.  Glucose, capillary     Status: None   Collection Time: 01/12/21  6:10 AM  Result Value Ref Range   Glucose-Capillary 85 70 - 99 mg/dL    Comment: Glucose reference range applies only to samples taken after fasting for at least 8 hours.  Basic metabolic panel  Status: Abnormal   Collection Time: 01/13/21  4:41 AM  Result Value Ref Range   Sodium 136 135 - 145 mmol/L   Potassium 4.2 3.5 - 5.1 mmol/L   Chloride 104 98 - 111 mmol/L   CO2 22 22 - 32 mmol/L   Glucose, Bld 63 (L) 70 - 99 mg/dL    Comment: Glucose reference range applies only to samples taken after fasting for at least 8 hours.   BUN 16 8 - 23 mg/dL   Creatinine, Ser 0.74 0.61 - 1.24 mg/dL   Calcium 8.9 8.9 - 10.3 mg/dL   GFR, Estimated >60 >60 mL/min    Comment: (NOTE) Calculated using the CKD-EPI Creatinine Equation (2021)    Anion gap 10 5 - 15    Comment:  Performed at [facility], [address] [place], [place] [address]  CBC     Status: Abnormal   Collection Time: 01/13/21  4:41 AM  Result Value Ref Range   WBC 10.3 4.0 - 10.5 K/uL   RBC 4.32 4.22 - 5.81 MIL/uL   Hemoglobin 13.2 13.0 - 17.0 g/dL   HCT 40.0 39.0 - 52.0 %   MCV 92.6 80.0 - 100.0 fL   MCH 30.6 26.0 - 34.0 pg   MCHC 33.0 30.0 - 36.0 g/dL   RDW 18.4 (H) 11.5 - 15.5 %   Platelets 179 150 - 400 K/uL   nRBC 0.0 0.0 - 0.2 %    Comment: Performed at [facility], [address], [place], [place] [address]  Magnesium     Status: None   Collection Time: 01/13/21  4:41 AM  Result Value Ref Range   Magnesium 2.3 1.7 - 2.4 mg/dL    Comment: Performed at [facility], [address] [place], [place] [address]  Glucose, capillary     Status: None   Collection Time: 01/13/21  7:24 AM  Result Value Ref Range   Glucose-Capillary 82 70 - 99 mg/dL    Comment: Glucose reference range applies only to samples taken after fasting for at least 8 hours.  Glucose, capillary     Status: None   Collection Time: 01/13/21  3:45 PM  Result Value Ref Range   Glucose-Capillary 79 70 - 99 mg/dL    Comment: Glucose reference range applies only to samples taken after fasting for at least 8 hours.    Current Facility-Administered Medications  Medication Dose Route Frequency Provider Last Rate Last Admin  . albuterol (VENTOLIN HFA) 108 (90 Base) MCG/ACT inhaler 2 puff  2 puff Inhalation Q4H PRN Ivor Costa, MD      . cholecalciferol (VITAMIN D) tablet 1,000 Units  1,000 Units Oral Daily Ivor Costa, MD   1,000 Units at 01/13/21 1119  . dextromethorphan-guaiFENesin (MUCINEX DM) 30-600 MG per 12 hr tablet 1 tablet  1 tablet Oral BID PRN Ivor Costa, MD      . donepezil (ARICEPT) tablet 10 mg  10 mg Oral QHS Ivor Costa, MD   10 mg at 01/12/21 2113  . dutasteride (AVODART) capsule 0.5 mg  0.5 mg Oral Daily Ivor Costa, MD   0.5 mg at 01/13/21 1117  . enoxaparin (LOVENOX)  injection 60 mg  0.5 mg/kg Subcutaneous Q24H Dorothe Pea, RPH   60 mg at 01/11/21 0925  . feeding supplement (ENSURE ENLIVE / ENSURE PLUS) liquid 237 mL  237 mL Oral TID BM Sharen Hones, MD   237 mL at 01/13/21 1525  . fluticasone (FLONASE) 50 MCG/ACT nasal  spray 1 spray  1 spray Each Nare Daily Ivor Costa, MD   1 spray at 01/11/21 4098  . hydrALAZINE (APRESOLINE) injection 5 mg  5 mg Intravenous Q2H PRN Ivor Costa, MD      . hydrOXYzine (ATARAX/VISTARIL) tablet 10 mg  10 mg Oral TID PRN Ivor Costa, MD      . hydrOXYzine (ATARAX/VISTARIL) tablet 20 mg  20 mg Oral QHS Ivor Costa, MD   20 mg at 01/12/21 2113  . hydrOXYzine (VISTARIL) injection 25 mg  25 mg Intramuscular Q6H PRN Ivor Costa, MD      . levothyroxine (SYNTHROID) tablet 50 mcg  50 mcg Oral Q0600 Ivor Costa, MD   50 mcg at 01/13/21 1191  . megestrol (MEGACE) 400 MG/10ML suspension 400 mg  400 mg Oral Daily Sharen Hones, MD   400 mg at 01/13/21 0845  . melatonin tablet 3 mg  3 mg Oral QHS Ivor Costa, MD   3 mg at 01/12/21 2113  . mirtazapine (REMERON SOL-TAB) disintegrating tablet 15 mg  15 mg Oral QHS Taylynn Easton, Madie Reno, MD   15 mg at 01/12/21 2114  . modafinil (PROVIGIL) tablet 100 mg  100 mg Oral Daily Taneia Mealor, Madie Reno, MD   100 mg at 01/13/21 0845  . mometasone-formoterol (DULERA) 200-5 MCG/ACT inhaler 2 puff  2 puff Inhalation BID Ivor Costa, MD   2 puff at 01/12/21 2113  . multivitamin with minerals tablet 1 tablet  1 tablet Oral Daily Sharen Hones, MD   1 tablet at 01/13/21 1119  . oxyCODONE (Oxy IR/ROXICODONE) immediate release tablet 5 mg  5 mg Oral Q12H PRN Allie Bossier, MD   5 mg at 01/13/21 0737  . senna-docusate (Senokot-S) tablet 2 tablet  2 tablet Oral BID Sharen Hones, MD   2 tablet at 01/10/21 2125  . tamsulosin (FLOMAX) capsule 0.4 mg  0.4 mg Oral Daily Ivor Costa, MD   0.4 mg at 01/13/21 1117  . tiotropium (SPIRIVA) inhalation capsule ([facility] use ONLY) 18 mcg  18 mcg Inhalation Daily Ivor Costa, MD   18 mcg at 01/11/21  0926  . topiramate (TOPAMAX) tablet 50 mg  50 mg Oral BID Ivor Costa, MD   50 mg at 01/13/21 1117  . vitamin B-12 (CYANOCOBALAMIN) tablet 1,000 mcg  1,000 mcg Oral Daily Ivor Costa, MD   1,000 mcg at 01/13/21 1119    Musculoskeletal: Strength & Muscle Tone: decreased Gait & Station: unable to stand Patient leans: N/A  Psychiatric Specialty Exam: Physical Exam Vitals and nursing note reviewed.  Constitutional:      Appearance: He is well-developed and well-nourished.  HENT:     Head: Normocephalic and atraumatic.  Eyes:     Conjunctiva/sclera: Conjunctivae normal.     Pupils: Pupils are equal, round, and reactive to light.  Cardiovascular:     Heart sounds: Normal heart sounds.  Pulmonary:     Effort: Pulmonary effort is normal.  Abdominal:     Palpations: Abdomen is soft.  Musculoskeletal:        General: Normal range of motion.     Cervical back: Normal range of motion.  Skin:    General: Skin is warm and dry.  Neurological:     General: No focal deficit present.     Mental Status: He is alert.  Psychiatric:        Attention and Perception: He is inattentive.     Review of Systems  Unable to perform ROS: Mental status change  Blood pressure (!) 116/56, pulse 87, temperature 99 F (37.2 C), resp. rate 20, height 6' (1.829 m), weight 117.9 kg, SpO2 97 %.Body mass index is 35.26 kg/m.  General Appearance: Casual  Eye Contact:  None  Speech:  Negative  Volume:  Decreased  Mood:  Negative  Affect:  Negative  Thought Process:  NA  Orientation:  Negative  Thought Content:  Negative  Suicidal Thoughts:  No  Homicidal Thoughts:  No  Memory:  Negative  Judgement:  Negative  Insight:  Negative  Psychomotor Activity:  Negative  Concentration:  Concentration: Negative  Recall:  Negative  Fund of Knowledge:  Negative  Language:  Negative  Akathisia:  Negative  Handed:  Right  AIMS (if indicated):     Assets:  Social Support  ADL's:  Impaired  Cognition:   Impaired,  Severe  Sleep:        Treatment Plan Summary: Plan Patient almost unresponsive severely delirious.  Attempts at adding modafinil did not seem to have been of much improvement but neither much harm.  No change to current medication plan although I imagine they are still looking to get him released from the hospital fairly soon if possible to palliative care  Disposition: Patient does not meet criteria for psychiatric inpatient admission.  Alethia Berthold, MD 01/13/2021 6:04 PM

## 2021-01-13 NOTE — Progress Notes (Addendum)
ADDENDUM: Request from Doran Clay, [facility] to hold/cancel Hospice Home referral at this time per family request.   [facility] [address] [facility]) Hospital Liaison RN note:  Received request from Dr. Billie Ruddy and Doran Clay, [facility] for family interest in [place]. Chart reviewed and eligibility has been approved. Attempted to speak with son, [name] over the phone to confirm interests and explain services. VM left.  Unfortunately, Hospice Home is not able to offer a room today. Hospital care team is aware. [facility] Liaison will follow for room availability.  Please call with any hospice related questions or concerns.  Thank you for the opportunity to participate in this patient's care.  Zandra Abts, RN [facility] Liaison [phone]

## 2021-01-14 DIAGNOSIS — R410 Disorientation, unspecified: Secondary | ICD-10-CM | POA: Diagnosis not present

## 2021-01-14 DIAGNOSIS — Z515 Encounter for palliative care: Secondary | ICD-10-CM | POA: Diagnosis not present

## 2021-01-14 LAB — GLUCOSE, CAPILLARY
Glucose-Capillary: 94 mg/dL (ref 70–99)
Glucose-Capillary: 93 mg/dL (ref 70–99)
Glucose-Capillary: 99 mg/dL (ref 70–99)
Glucose-Capillary: 121 mg/dL — ABNORMAL HIGH (ref 70–99)
Glucose-Capillary: 101 mg/dL — ABNORMAL HIGH (ref 70–99)

## 2021-01-14 NOTE — Progress Notes (Signed)
PROGRESS NOTE    CARNELIUS HAMMITT  TGG:[id number] DOB: 1940-12-07 DOA: 01/03/2021 PCP: [name], MD     Brief Narrative:  80 y.o.WM PMH Depression, Anxietyx DM type II, diet-controlled, COPD, Asthma, stroke, GERD, Hypothyroidism, , OSA on CPAP, IBS, urethral stricture, Diverticulitis, GI bleeding, BPH, Interstitial Lung disease, Dementia,   Presents with abdominal pain, chest pain, generalized weakness. CT abdomen/pelvis with contrast showed large left lower lobe mass with multiple liver metastasis.Liver biopsy performed on 2/14.   Subjective: Pt was awake, but not responding to any questions.     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 -Elevated liver function due to liver metastasis. -2/14 s/p liver biopsy; awaiting results -Patient is followed by oncology, biopsy results not available. -Decrease pain medication secondary to decreased cognition -2/16 Echocardiogram pending.  Patient with metastatic cancer -2/18 metastatic neuroendocrine tumor see results below.  Oncology on board, palliative care on board.  Son is an NP at [facility] ED  --Health care POA son [name]  changed goals of care on 2/21 and wanted everything done to keep pt alive.  Hospice referral therefore canceled.  Interstitial lung disease/COPD --not in exacerbation --cont Dulera and Spiriva  Lactic acidosis persistent 2/2 metastatic cancer -2/16 D5-0.9% saline 138ml/hr -2/17 increasing lactic acidosis bolus normal saline 162ml/hr -2/18 even with aggressive hydration lactic acidosis remains. --hold further IVF  Acute metabolic encephalopathy Baseline dementia --Pt getting progressively more lethargic, not responsive, and decreased oral intake.  Encephalopathy likely due to cancer process, persistent lactic acidosis, poor oral intake, etc. -2/18 per palliative care recommendation started modafinil --oxycodone d/c'ed, per son's request, to attempt to improve pt's somnolence  Severe suicidal thoughts Depression -psych consulted and following --cont Remeron and Modafinil  Failure to thrive/Anorexia Hypoglycemia -Megace 400 mg daily --cont Remeron --BG checks ACHS to monitor for hypoglycemia --will need to consider tube feed if persistent poor oral intake  DM type II controlled without complication --V0J= 5.0 --BG have been wnl --no need for SSI  Elevated liver enzymes -Secondary to metastatic cancer.  This along with patient's increasing lactic acidosis despite aggressive treatment poor prognostic factor for survival  BPH --cont home dutasteride and flomax  Goals of care -2/17 palliative care; 2/17 spoke NP [name] (son) spoke at length with son and he expressed wish that his father be comfortable and passed with dignity.  Would like palliative care to become involved discussed options. -2/18 SNF not a realistic option as patient unable to participate in any meaningful rehab.  Now that we have all all of the results from biopsy, and have attempted to improve patient's lactic acidosis and mental status decision on residential hospice will need to be finalized.   --  Health care  POA son [name] changed goals of care on 2/21 and wanted to everything done to keep pt alive.  Hospice referral therefore canceled.   DVT prophylaxis:  Code Status: DNR Family Communication:  Status is: Inpatient  Dispo: The patient is from: Home              Anticipated d/c is to: undetermined              Anticipated d/c date is: undetermined, due to no safe discharge plan.              Consultants:  Psychiatry Oncology  Procedures/Significant Events:  2/14 LEFT liver lobe biopsy;POORLY DIFFERENTIATED CARCINOMA WITH NEUROENDOCRINE DIFFERENTIATION AND EXTENSIVE NECROSIS 2/16 Echocardiogram;Left Ventricle: LVEF=60 to 65%.-. Left ventricular diastolic function  could not be evaluated.     Cultures   Antimicrobials: Anti-infectives (From admission, onward)   None        Continuous Infusions:    Objective: Vitals:   01/13/21 2339 01/14/21 0335 01/14/21 0747 01/14/21 1152  BP: (!) 131/58 128/60 127/66 138/67  Pulse: 93 94 97 100  Resp: 18 20 16 16   Temp: 97.8 F (36.6 C) 98.6 F (37 C) 98.9 F (37.2 C) 98.3 F (36.8 C)  TempSrc: Oral Oral Oral Oral  SpO2: 98% 97% 98% 97%  Weight:      Height:       No intake or output data in the 24 hours ending 01/14/21 1355 Filed Weights   01/03/21 1104  Weight: 117.9 kg    Examination:  Constitutional: NAD, awake, not responding to questions or commands HEENT: conjunctivae and lids normal, EOMI CV: No cyanosis.   RESP: No distress, on 2L Extremities: swelling in BLE and feet improved SKIN: warm, dry   CBC: Recent Labs  Lab 01/08/21 0500 01/09/21 0714 01/10/21 0550 01/11/21 0410 01/12/21 0338 01/13/21 0441  WBC 9.9 9.7 12.3* 11.4* 10.3 10.3  NEUTROABS 7.4 7.0 9.8* 9.0*  --   --   HGB 12.7* 12.5* 12.9* 12.5* 12.6* 13.2  HCT 39.8 39.2 39.8 39.1 38.8* 40.0  MCV 93.4 94.2 93.4 93.1 93.0 92.6  PLT 166 164 152 150 151 845   Basic Metabolic Panel: Recent Labs  Lab 01/09/21 0714 01/10/21 0550  01/11/21 0410 01/12/21 0338 01/13/21 0441  NA 139 139 138 137 136  K 3.8 3.9 4.0 4.1 4.2  CL 103 105 106 106 104  CO2 25 24 25 22 22   GLUCOSE 117* 102* 94 69* 63*  BUN 19 15 14 14 16   CREATININE 0.93 0.77 0.73 0.76 0.74  CALCIUM 9.3 9.2 8.8* 9.0 8.9  MG 2.4 2.2 2.2 2.3 2.3  PHOS 2.9 3.2 3.0  --   --    GFR: Estimated Creatinine Clearance: 100.9 mL/min (by C-G formula based on SCr of 0.74 mg/dL). Liver Function Tests: Recent Labs  Lab 01/09/21 0714 01/10/21 0550 01/11/21 0410  AST 224* 198* 216*  ALT 73* 69* 75*  ALKPHOS 491* 477* 445*  BILITOT 2.5* 2.1* 2.3*  PROT 6.2* 5.9* 5.8*  ALBUMIN 2.5* 2.3* 2.2*   No results for input(s): LIPASE, AMYLASE in the last 168 hours. Recent Labs  Lab 01/09/21 1322 01/10/21 0550 01/11/21 0410  AMMONIA 42* 41* 49*   Coagulation Profile: No results for input(s): INR, PROTIME in the last 168 hours. Cardiac Enzymes: No results for input(s): CKTOTAL, CKMB, CKMBINDEX, TROPONINI in the last 168 hours. BNP (last 3 results) No results for input(s): PROBNP in the last 8760 hours. HbA1C:  No results for input(s): HGBA1C in the last 72 hours. CBG: Recent Labs  Lab 01/13/21 1545 01/13/21 2028 01/14/21 0552 01/14/21 0820 01/14/21 1139  GLUCAP 79 81 94 93 99   Lipid Profile: No results for input(s): CHOL, HDL, LDLCALC, TRIG, CHOLHDL, LDLDIRECT in the last 72 hours. Thyroid Function Tests: No results for input(s): TSH, T4TOTAL, FREET4, T3FREE, THYROIDAB in the last 72 hours. Anemia Panel: No results for input(s): VITAMINB12, FOLATE, FERRITIN, TIBC, IRON, RETICCTPCT in the last 72 hours. Sepsis Labs: Recent Labs  Lab 01/09/21 0848 01/09/21 1116 01/10/21 0947 01/10/21 1601  LATICACIDVEN 4.1* 4.2* 4.2* 4.1*    No results found for this or any previous visit (from the past 240 hour(s)).       Radiology Studies: No results found.      Scheduled Meds: . cholecalciferol  1,000 Units Oral Daily  . donepezil  10 mg Oral QHS   . dutasteride  0.5 mg Oral Daily  . enoxaparin (LOVENOX) injection  0.5 mg/kg Subcutaneous Q24H  . feeding supplement  237 mL Oral TID BM  . fluticasone  1 spray Each Nare Daily  . hydrOXYzine  20 mg Oral QHS  . levothyroxine  50 mcg Oral Q0600  . megestrol  400 mg Oral Daily  . melatonin  3 mg Oral QHS  . mirtazapine  15 mg Oral QHS  . modafinil  100 mg Oral Daily  . mometasone-formoterol  2 puff Inhalation BID  . multivitamin with minerals  1 tablet Oral Daily  . senna-docusate  2 tablet Oral BID  . tamsulosin  0.4 mg Oral Daily  . tiotropium  18 mcg Inhalation Daily  . topiramate  50 mg Oral BID  . vitamin B-12  1,000 mcg Oral Daily   Continuous Infusions:    LOS: 11 days     Enzo Bi, MD [facility]   If 7PM-7AM, please contact night-coverage 01/14/2021, 1:55 PM

## 2021-01-14 NOTE — Progress Notes (Signed)
[facility]  [phone] [phone]   Name: Eddie Carter Date: 01/14/2021 MRN: [id number]  DOB: September 01, 1941  Patient Care Team: [name], MD as PCP - General (Cardiology) Jessy Oto, MD as Consulting Physician (Orthopedic Surgery) Telford Nab, RN as Oncology Nurse Navigator    REASON FOR CONSULTATION: Eddie Carter is a 80 y.o. male with multiple medical problems including COPD, OSA on CPAP, ILD, history of CVA, IBS, urethral stricture DM, and mild dementia.  Patient was admitted to the hospital on 01/03/2021 with persistent abdominal pain.  CT of the abdomen/pelvis revealed a left lower lobe mass and diffuse hepatic metastases.  MRI of the brain revealed two punctate foci in the left cerebral hemisphere concerning for tiny metastases.  Palliative care was consulted to help address goals..    SOCIAL HISTORY:     reports that he quit smoking about 25 years ago. His smoking use included cigarettes. He has a 60.00 pack-year smoking history. He has never used smokeless tobacco. He reports that he does not drink alcohol and does not use drugs.  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  Patient lives at home alone.  He is in process of divorcing his wife. He has two sons.   ADVANCE DIRECTIVES:  So is reportedly his HCPOA  CODE STATUS: DNR  PAST MEDICAL HISTORY: Past Medical History:  Diagnosis Date  . Arthritis   . Asthma   . BPH (benign prostatic hyperplasia)   . 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 Complication of anesthesia    after septoplasty and uvulectomy-was in icu- [facility] - 10 yrs. ago  . COPD (chronic obstructive pulmonary disease) ([place])   . Depression   . Diabetes mellitus without complication ([place])   . Diverticulitis   . ED (erectile dysfunction)   . Full dentures   . GERD  (gastroesophageal reflux disease)    no meds  . Headache    difficulty with headaches- 1950's , again in 1967, 2008- again problems with migrraines   . History of GI bleed   . History of multiple pulmonary nodules   . History of shingles   . History of urethral stricture   . HOH (hard of hearing)   . HOH (hard of hearing)   . Hypogonadism in male   . IBS (irritable bowel syndrome)   . Joint pain   . Medial meniscus, posterior horn derangement    left knee  . Melanosis coli   . Obesity   . Pneumonia 2014   [facility]  . Shortness of breath   . Skin cancer   . Sleep apnea    uses a cpap, most nights   . Stroke [facility]) 1995   some speech and thought processes slow  . Traumatic tear of lateral meniscus of right knee    right knee  . Urinary frequency   . Urinary incontinence     PAST SURGICAL HISTORY:  Past Surgical History:  Procedure Laterality Date  . APPENDECTOMY  1963  . BACK SURGERY    . CARPAL TUNNEL RELEASE     left  . CATARACT EXTRACTION W/PHACO Left 09/13/2018   Procedure: CATARACT EXTRACTION PHACO AND INTRAOCULAR LENS PLACEMENT (IOC);  Surgeon: Birder Robson, MD;  Location: [facility] ORS;  Service: Ophthalmology;  Laterality: Left;  [place]  00:42  CDE 6.73 Fluid pack lot #@ [id number]  . CATARACT EXTRACTION W/PHACO Right 11/23/2019   Procedure: CATARACT EXTRACTION PHACO AND INTRAOCULAR LENS PLACEMENT ([place]) RIGHT;  Surgeon: Birder Robson, MD;  Location: [facility] ORS;  Service: Ophthalmology;  Laterality: Right;  [place] 01:10.1 CDE 10.25 Fluid Pack Lot # [id number]  . COLONOSCOPY    . COLONOSCOPY WITH PROPOFOL N/A 12/07/2016   Procedure: COLONOSCOPY WITH PROPOFOL;  Surgeon: Lollie Sails, MD;  [facility] END[facility]COPY;  Service: Endoscopy;  Laterality: N/A;  . COLONOSCOPY WITH PROPOFOL N/A 06/26/2019   Procedure: COLONOSCOPY WITH PROPOFOL;  Surgeon: Lollie Sails, MD;  Location: [facility] ENDOSCOPY;  Service: Endoscopy;  Laterality: N/A;  . EYE SURGERY     foreign body removed fr.  eye, ? side   . FOOT FOREIGN BODY REMOVAL  1976   left foot -multiple pieces glass  . HEMORROIDECTOMY  1995  . KNEE ARTHROSCOPY Bilateral 01/17/2013   Procedure: ARTHROSCOPY KNEE BILATERAL WITH MEDIAL AND LATERAL MENISECTOMIES;  Surgeon: Lorn Junes, MD;  Location: [place];  Service: Orthopedics;  Laterality: Bilateral;  . KYPHOPLASTY N/A 07/23/2017   Procedure: KYPHOPLASTY- L1;  Surgeon: Hessie Knows, MD;  Location: [facility] ORS;  Service: Orthopedics;  Laterality: N/A;  . LUMBAR LAMINECTOMY N/A 11/05/2014   Procedure: LEFT L3-4 MICRODISCECTOMY;  Surgeon: Jessy Oto, MD;  Location: [place];  Service: Orthopedics;  Laterality: N/A;  . LUMBAR LAMINECTOMY/DECOMPRESSION MICRODISCECTOMY N/A 02/12/2015   Procedure: RE-DO LEFT L3-4 MICRODISCECTOMY;  Surgeon: Jessy Oto, MD;  Location: [place];  Service: Orthopedics;  Laterality: N/A;  . NASAL SEPTUM SURGERY  1995  . TONSILLECTOMY    . UPPER GI ENDOSCOPY    . UVULOPALATOPHARYNGOPLASTY (UPPP)/TONSILLECTOMY/SEPTOPLASTY  1995   same time with septoplasty-ended up ICU almost trached    HEMATOLOGY/ONCOLOGY HISTORY:  Oncology History   No history exists.    ALLERGIES:  is allergic to asa [aspirin], bee venom, nsaids, and tolmetin.  MEDICATIONS:  Current Facility-Administered Medications  Medication Dose Route Frequency Provider Last Rate Last Admin  . acetaminophen (TYLENOL) tablet 1,000 mg  1,000 mg Oral TID PRN Enzo Bi, MD      . albuterol (VENTOLIN HFA) 108 (90 Base) MCG/ACT inhaler 2 puff  2 puff Inhalation Q4H PRN Ivor Costa, MD      . cholecalciferol (VITAMIN D) tablet 1,000 Units  1,000 Units Oral Daily Ivor Costa, MD   1,000 Units at 01/14/21 [phone]  . dextromethorphan-guaiFENesin (MUCINEX DM) 30-600 MG per 12 hr tablet 1 tablet  1 tablet Oral BID PRN Ivor Costa, MD      . donepezil (ARICEPT) tablet 10 mg  10 mg Oral QHS Ivor Costa, MD   10 mg at 01/13/21 2125  . dutasteride (AVODART) capsule 0.5 mg  0.5 mg Oral Daily  Ivor Costa, MD   0.5 mg at 01/14/21 3846  . enoxaparin (LOVENOX) injection 60 mg  0.5 mg/kg Subcutaneous Q24H Dorothe Pea, RPH   60 mg at 01/14/21 0945  . feeding supplement (ENSURE ENLIVE / ENSURE PLUS) liquid 237 mL  237 mL Oral TID BM Sharen Hones, MD   237 mL at 01/14/21 0948  . fluticasone (FLONASE) 50 MCG/ACT nasal spray 1 spray  1 spray Each Nare Daily Ivor Costa, MD   1 spray at 01/14/21 [phone]  . hydrALAZINE (APRESOLINE) injection 5 mg  5 mg Intravenous Q2H PRN Ivor Costa, MD      . hydrOXYzine (ATARAX/VISTARIL) tablet 10 mg  10 mg Oral TID PRN Ivor Costa, MD      .  hydrOXYzine (ATARAX/VISTARIL) tablet 20 mg  20 mg Oral QHS Ivor Costa, MD   20 mg at 01/13/21 2125  . hydrOXYzine (VISTARIL) injection 25 mg  25 mg Intramuscular Q6H PRN Ivor Costa, MD      . levothyroxine (SYNTHROID) tablet 50 mcg  50 mcg Oral Q0600 Ivor Costa, MD   50 mcg at 01/14/21 [phone]  . megestrol (MEGACE) 400 MG/10ML suspension 400 mg  400 mg Oral Daily Sharen Hones, MD   400 mg at 01/14/21 0931  . melatonin tablet 3 mg  3 mg Oral QHS Ivor Costa, MD   3 mg at 01/13/21 2124  . mirtazapine (REMERON SOL-TAB) disintegrating tablet 15 mg  15 mg Oral QHS Clapacs, Madie Reno, MD   15 mg at 01/13/21 2124  . modafinil (PROVIGIL) tablet 100 mg  100 mg Oral Daily Clapacs, Madie Reno, MD   100 mg at 01/14/21 7902  . mometasone-formoterol (DULERA) 200-5 MCG/ACT inhaler 2 puff  2 puff Inhalation BID Ivor Costa, MD   2 puff at 01/14/21 [phone]  . multivitamin with minerals tablet 1 tablet  1 tablet Oral Daily Sharen Hones, MD   1 tablet at 01/14/21 0930  . senna-docusate (Senokot-S) tablet 2 tablet  2 tablet Oral BID Sharen Hones, MD   2 tablet at 01/14/21 0930  . tamsulosin (FLOMAX) capsule 0.4 mg  0.4 mg Oral Daily Ivor Costa, MD   0.4 mg at 01/14/21 0930  . tiotropium (SPIRIVA) inhalation capsule ([facility] use ONLY) 18 mcg  18 mcg Inhalation Daily Ivor Costa, MD   18 mcg at 01/14/21 [phone]  . topiramate (TOPAMAX) tablet 50 mg  50 mg Oral BID Ivor Costa, MD   50 mg at 01/14/21 9924  . vitamin B-12 (CYANOCOBALAMIN) tablet 1,000 mcg  1,000 mcg Oral Daily Ivor Costa, MD   1,000 mcg at 01/14/21 2683    VITAL SIGNS: BP 138/67 (BP Location: Right Wrist)   Pulse 100   Temp 98.3 F (36.8 C) (Oral)   Resp 16   Ht 6' (1.829 m)   Wt 260 lb (117.9 kg)   SpO2 97%   BMI 35.26 kg/m  Filed Weights   01/03/21 1104  Weight: 260 lb (117.9 kg)    Estimated body mass index is 35.26 kg/m as calculated from the following:   Height as of this encounter: 6' (1.829 m).   Weight as of this encounter: 260 lb (117.9 kg).  LABS: CBC:    Component Value Date/Time   WBC 10.3 01/13/2021 0441   HGB 13.2 01/13/2021 0441   HGB 17.5 10/28/2014 2234   HCT 40.0 01/13/2021 0441   HCT 44.1 01/16/2016 0843   PLT 179 01/13/2021 0441   PLT 189 10/28/2014 2234   MCV 92.6 01/13/2021 0441   MCV 94 10/28/2014 2234   NEUTROABS 9.0 (H) 01/11/2021 0410   NEUTROABS 13.2 (H) 07/28/2013 0643   LYMPHSABS 1.2 01/11/2021 0410   LYMPHSABS 1.6 07/28/2013 0643   MONOABS 0.9 01/11/2021 0410   MONOABS 0.8 07/28/2013 0643   EOSABS 0.2 01/11/2021 0410   EOSABS 0.0 07/28/2013 0643   BASOSABS 0.1 01/11/2021 0410   BASOSABS 0.0 07/28/2013 0643   Comprehensive Metabolic Panel:    Component Value Date/Time   NA 136 01/13/2021 0441   NA 138 10/28/2014 2234   K 4.2 01/13/2021 0441   K 3.9 10/28/2014 2234   CL 104 01/13/2021 0441   CL 100 10/28/2014 2234   CO2 22 01/13/2021 0441   CO2 28 10/28/2014 2234  BUN 16 01/13/2021 0441   BUN 22 (H) 10/28/2014 2234   CREATININE 0.74 01/13/2021 0441   CREATININE 1.09 06/05/2020 1210   GLUCOSE 63 (L) 01/13/2021 0441   GLUCOSE 126 (H) 10/28/2014 2234   CALCIUM 8.9 01/13/2021 0441   CALCIUM 8.9 10/28/2014 2234   AST 216 (H) 01/11/2021 0410   AST 37 10/28/2014 2234   ALT 75 (H) 01/11/2021 0410   ALT 69 (H) 10/28/2014 2234   ALKPHOS 445 (H) 01/11/2021 0410   ALKPHOS 69 10/28/2014 2234   BILITOT 2.3 (H) 01/11/2021 0410    BILITOT 0.3 01/16/2016 0843   BILITOT 0.7 10/28/2014 2234   PROT 5.8 (L) 01/11/2021 0410   PROT 6.4 01/16/2016 0843   PROT 7.2 10/28/2014 2234   ALBUMIN 2.2 (L) 01/11/2021 0410   ALBUMIN 3.9 01/16/2016 0843   ALBUMIN 3.8 10/28/2014 2234    RADIOGRAPHIC STUDIES: CT ANGIO CHEST PE W OR WO CONTRAST  Result Date: 01/04/2021 CLINICAL DATA:  Left lung mass noted on CT scan of the abdomen from yesterday. EXAM: CT ANGIOGRAPHY CHEST WITH CONTRAST TECHNIQUE: Multidetector CT imaging of the chest was performed using the standard protocol during bolus administration of intravenous contrast. Multiplanar CT image reconstructions and MIPs were obtained to evaluate the vascular anatomy. CONTRAST:  12mL OMNIPAQUE IOHEXOL 350 MG/ML SOLN COMPARISON:  CT abdomen/pelvis 01/03/2021. FINDINGS: Cardiovascular: High mild cardiac enlargement but no pericardial effusion. Moderate atherosclerotic calcifications involving the aorta no focal aneurysm or dissection. Scattered coronary artery calcifications. The pulmonary arteries are unremarkable. No findings for pulmonary embolism. Mediastinum/Nodes: Scattered borderline enlarged mediastinal and hilar lymph nodes. Prevascular lymph node on image number 33/4 measures 9.5 mm. Small AP window nodes are less than 6 mm. Left hilar node on image number 49/4 measures 8 mm. Left-sided subcarinal lymph node on image 58/4 measures 10.5 mm. The esophagus is grossly normal.  The thyroid gland is unremarkable. Lungs/Pleura: As demonstrated on the recent abdominal CT scan there is a lobulated appearing mass in the left lower lobe. No air bronchograms to suggest this is an infiltrate. It measures approximately 6.5 x 5.2 cm and is worrisome for neoplasm. 10 mm nodule in the left upper lobe on image number 47/6. 8 mm subpleural nodule in the left lower lobe on image number 58/6. 5.5 mm right lower lobe pulmonary nodule on image number 50/6. Findings consistent with pulmonary metastatic disease.  Underlying emphysematous changes and pulmonary scarring. Upper Abdomen: Innumerable hepatic metastatic lesions are noted. Musculoskeletal: No chest wall mass, supraclavicular or axillary lymphadenopathy the. The bony thorax is intact. No findings suspicious for osseous metastatic disease. Review of the MIP images confirms the above findings. IMPRESSION: 1. 6.5 x 5.2 cm left lower lobe pulmonary mass worrisome for neoplasm. There are borderline enlarged hilar and mediastinal lymph nodes suspicious for metastatic adenopathy. 2. Bilateral pulmonary nodules consistent with pulmonary metastatic disease. 3. Innumerable hepatic metastatic lesions. 4. No findings suspicious for osseous metastatic disease. 5. PET-CT may be helpful for further evaluation and staging purposes. 6. Emphysema and aortic atherosclerosis. Aortic Atherosclerosis (ICD10-I70.0) and Emphysema (ICD10-J43.9). Electronically Signed   By: Marijo Sanes M.D.   On: 01/04/2021 15:33   MR BRAIN W WO CONTRAST  Result Date: 01/04/2021 CLINICAL DATA:  Non-small cell lung cancer.  Staging. EXAM: MRI HEAD WITHOUT AND WITH CONTRAST TECHNIQUE: Multiplanar, multiecho pulse sequences of the brain and surrounding structures were obtained without and with intravenous contrast. CONTRAST:  75mL GADAVIST GADOBUTROL 1 MMOL/ML IV SOLN COMPARISON:  Head CT 07/13/2017 FINDINGS: Brain: Axial  and coronal postcontrast imaging are markedly degraded by patient motion. No focal abnormality affects the brainstem or cerebellum. Within the cerebral hemispheres, there is a punctate focus of restricted diffusion adjacent to the posterior body of the left lateral ventricle image 79. There is a punctate focus of restricted diffusion in a left parietal vertex gyrus, image 88. The diffusion findings are suspicious for tiny metastases, but that cannot be confirmed because of the motion degradation. Consider repeat axial and coronal imaging when the patient is able to tolerate the procedure  better. Otherwise, there are mild chronic small-vessel ischemic changes of the hemispheric white matter. No cortical or large vessel territory infarction. No sign of hemorrhage, hydrocephalus or extra-axial collection. Vascular: Major vessels at the base of the brain show flow. Skull and upper cervical spine: Negative Sinuses/Orbits: Clear/normal Other: None IMPRESSION: 1. Markedly motion degraded exam. Axial and coronal postcontrast imaging was markedly degraded and essentially nondiagnostic. 2. Two punctate foci of restricted diffusion in the left cerebral hemisphere, one adjacent to the posterior body of the left lateral ventricle and the other in a left parietal vertex gyrus. The diffusion findings are suspicious for tiny metastases, but that cannot be confirmed because of the motion degradation. Consider repeat axial and coronal postcontrast imaging when the patient is able to tolerate the procedure. Electronically Signed   By: Nelson Chimes M.D.   On: 01/04/2021 19:26   CT ABDOMEN PELVIS W CONTRAST  Result Date: 01/03/2021 CLINICAL DATA:  Right lower quadrant abdominal pain. EXAM: CT ABDOMEN AND PELVIS WITH CONTRAST TECHNIQUE: Multidetector CT imaging of the abdomen and pelvis was performed using the standard protocol following bolus administration of intravenous contrast. CONTRAST:  154mL OMNIPAQUE IOHEXOL 300 MG/ML  SOLN COMPARISON:  October 29, 2014. FINDINGS: Lower chest: 5.6 x 4.4 cm lobulated mass is noted in the left lower lobe laterally concerning for malignancy. Hepatobiliary: No gallstones or biliary dilatation is noted. Multiple rounded low densities are noted throughout the liver of varying sizes consistent with diffuse hepatic metastases. The largest measures 7.1 x 4.3 cm in the left hepatic lobe. Pancreas: Unremarkable. No pancreatic ductal dilatation or surrounding inflammatory changes. Spleen: Normal in size without focal abnormality. Adrenals/Urinary Tract: Adrenal glands are unremarkable.  Kidneys are normal, without renal calculi, focal lesion, or hydronephrosis. Bladder is unremarkable. Stomach/Bowel: Stomach appears normal. Status post appendectomy. There is no evidence of bowel obstruction or inflammation. Vascular/Lymphatic: Atherosclerosis of abdominal aorta is noted without aneurysm or dissection. 2.4 cm left periaortic lymph node is noted concerning for metastatic disease. Reproductive: Prostate is unremarkable. Other: No abdominal wall hernia or abnormality. No abdominopelvic ascites. Musculoskeletal: No acute or significant osseous findings. 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 IMPRESSION: 1. 5.6 x 4.4 cm lobulated mass is noted in the left lower lobe laterally concerning for malignancy. 2. Multiple rounded low densities are noted throughout the liver of varying sizes consistent with diffuse hepatic metastases. The largest measures 7.1 x 4.3 cm in the left hepatic lobe. 3. 2.4 cm left periaortic lymph node is noted concerning for metastatic disease. 4. Aortic atherosclerosis. Aortic Atherosclerosis (ICD10-I70.0). Electronically Signed   By: Marijo Conception M.D.   On: 01/03/2021 13:51   US BIOPSY (LIVER)  Result Date: 01/06/2021 INDICATION: Lung and multiple liver lesions suspicious for metastatic disease EXAM: Ultrasound-guided biopsy of liver lesions MEDICATIONS: None. ANESTHESIA/SEDATION: None COMPLICATIONS: None immediate. PROCEDURE: Informed written consent  was obtained from the patient after a thorough discussion of the procedural risks, benefits and alternatives. All questions were addressed. Maximal Sterile Barrier Technique was utilized including caps, mask, sterile gowns, sterile gloves, sterile drape, hand hygiene and skin antiseptic. A timeout was performed prior to the initiation of the procedure. Patient position supine on the ultrasound  table. Epigastric skin prepped and draped in usual sterile fashion. Following local lidocaine administration, 17 gauge introducer needle was advanced into 1 of the left hepatic lobe lesions, and four 18 gauge cores were obtained utilizing continuous ultrasound guidance. Gelfoam slurry was administered through the introducer needle at the biopsy site. Samples were sent to pathology in formalin. Needle removed and hemostasis achieved with 5 minutes of manual compression. Post procedure ultrasound images showed no evidence of significant hemorrhage. IMPRESSION: Ultrasound-guided biopsy of left liver lesion as above. Electronically Signed   By: Miachel Roux M.D.   On: 01/06/2021 13:46   ECHOCARDIOGRAM COMPLETE  Result Date: 01/08/2021    ECHOCARDIOGRAM REPORT   Patient Name:   KOBE OFALLON Date of Exam: 01/08/2021 Medical Rec #:  [id number]      Height:       72.0 in Accession #:    [id number]     Weight:       260.0 lb Date of Birth:  04-09-1941       BSA:          2.382 m Patient Age:    25 years       BP:           123/58 mmHg Patient Gender: M              HR:           91 bpm. Exam Location:  [facility] Procedure: 2D Echo, Color Doppler and Cardiac Doppler Indications:     CHF-acute systolic B71.69  History:         Patient has no prior history of Echocardiogram examinations.                  Stroke, Signs/Symptoms:Shortness of Breath; Risk Factors:Sleep                  Apnea.  Sonographer:     Sherrie Sport RDCS (AE) Referring Phys:  [id number] Geraldo Docker WOODS Diagnosing Phys: Kate Sable MD  Sonographer Comments: No apical window, no subcostal window and Technically challenging study due to limited acoustic windows. IMPRESSIONS  1. Left ventricular ejection fraction, by estimation, is 60 to 65%. The left ventricle has normal function. The left ventricle has no regional wall motion abnormalities. There is mild left ventricular hypertrophy. Left ventricular diastolic function could not be evaluated.  2. Right  ventricular systolic function is normal. The right ventricular size is not well visualized.  3. The mitral valve is degenerative. No evidence of mitral valve regurgitation. Moderate mitral annular calcification.  4. The aortic valve is normal in structure. Aortic valve regurgitation is not visualized.  5. Aortic dilatation noted. There is mild dilatation of the aortic root, measuring 39 mm. 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 FINDINGS  Left Ventricle: Left ventricular ejection fraction, by estimation, is 60 to 65%. The left ventricle has normal function. The left ventricle has no regional wall motion abnormalities. The left ventricular internal cavity size was normal in size. There is  mild left ventricular hypertrophy. Left ventricular diastolic function could not be evaluated. Right Ventricle: The right ventricular  size is not well visualized. No increase in right ventricular wall thickness. Right ventricular systolic function is normal. Left Atrium: Left atrial size was normal in size. Right Atrium: Right atrial size was not well visualized. Pericardium: There is no evidence of pericardial effusion. Mitral Valve: The mitral valve is degenerative in appearance. Moderate mitral annular calcification. No evidence of mitral valve regurgitation. Tricuspid Valve: The tricuspid valve is normal in structure. Tricuspid valve regurgitation is not demonstrated. Aortic Valve: The aortic valve is normal in structure. Aortic valve regurgitation is not visualized. Pulmonic Valve: The pulmonic valve was not well visualized. Pulmonic valve regurgitation is not visualized. Aorta: Aortic dilatation noted. There is mild dilatation of the aortic root, measuring 39 mm. 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 Venous: The inferior vena cava was not well visualized. IAS/Shunts: The interatrial septum was not well visualized.  LEFT VENTRICLE PLAX 2D LVIDd:          4.54 cm LVIDs:         2.62 cm LV PW:         1.45 cm LV IVS:        1.58 cm LVOT diam:     2.30 cm LVOT Area:     4.15 cm  LEFT ATRIUM         Index LA diam:    2.80 cm 1.18 cm/m                        PULMONIC VALVE AORTA                 PV Vmax:        0.48 m/s Ao Root diam: 4.13 cm PV Peak grad:   0.9 mmHg                       RVOT Peak grad: 2 mmHg   SHUNTS Systemic Diam: 2.30 cm Kate Sable MD Electronically signed by Kate Sable MD Signature Date/Time: 01/08/2021/3:58:28 PM    Final     PERFORMANCE STATUS (ECOG) : 4 - Bedbound  Review of Systems Unable to complete  Physical Exam General: NAD, frail appearing Pulmonary: Unlabored Extremities: no edema, no joint deformities Skin: no rashes Neurological: Weakness, lethargic  IMPRESSION: Patient is more alert.  Eyes are open and he is tracking activity around the room.  However, he is still requiring consistent prompting to answer questions.  Oral intake is still poor, mostly bites and sips.  I called and spoke with his son.  Son would like more time to see if patient clinically improves. Will reconsult PT/OT.  Continue supportive care.  PLAN: -Best supportive care -PT/OT -Will follow  Case and plan discussed with Dr. Janese Banks and CM  Time Total: 15 minutes  Visit consisted of counseling and education dealing with the complex and emotionally intense issues of symptom management and palliative care in the setting of serious and potentially life-threatening illness.Greater than 50%  of this time was spent counseling and coordinating care related to the above assessment and plan.  Signed by: Altha Harm, PhD, NP-C

## 2021-01-14 NOTE — Evaluation (Signed)
Physical TherapyRe Evaluation Patient Details Name: SHELDEN RABORN MRN: [id number] DOB: [birth year] Today's Date: 01/14/2021   History of Present Illness  80 y.o. male with medical history significant of diet-controlled diabetes, COPD, asthma, stroke, GERD, hypothyroidism, depression, anxiety, OSA on CPAP, IBS, urethral stricture, diverticulitis, GI bleeding, BPH, interstitial lung disease, dementia, who presents with abdominal pain, chest pain, generalized weakness.  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  Clinical Impression  Re-evaluation of pt, he continues to have very limited participation due to both mental and physical limitations.  He was able to state his name but beyond that was unable to interact meaningfully with any consistency.  He was able to occasionally show some effort with requests for U&LE movement but was generally too weak to elicit any AROM and c/o pain even minimal palpation with all attempts at AA/PROM.  Pt struggled to keep his eyes open and needed consistent and repeated cues to participate.  Will maintain on PT caseload to further assess appropriateness of continued care here and moving forward.  Pt currently very functionally limited.     Follow Up Recommendations Other (comment);Supervision/Assistance - 24 hour (per progress)    Equipment Recommendations  None recommended by PT    Recommendations for Other Services       Precautions / Restrictions Precautions Precautions: Fall Restrictions Weight Bearing Restrictions: No      Mobility  Bed Mobility               General bed mobility comments: deferred mobility due to pt's level of awareness, ability to stay awake, profound weakness, and difficulty following any instructions or doing any movement.    Transfers                    Ambulation/Gait                Stairs            Wheelchair Mobility    Modified  Rankin (Stroke Patients Only)       Balance                                             Pertinent Vitals/Pain Pain Assessment: Faces Faces Pain Scale: Hurts even more Pain Location: unable to rate, but groans in pain with even minimal palpation of either LE    Home Living Family/patient expects to be discharged to:: Private residence Living Arrangements: Alone Available Help at Discharge: Family Type of Home: House Home Access: Ramped entrance     Home Layout: One level Home Equipment: Environmental consultant - 2 wheels;Walker - 4 wheels;Bedside commode;Shower seat;Wheelchair - power      Prior Function Level of Independence: Needs assistance   Gait / Transfers Assistance Needed: patient uses a wheelchair for primary means of mobility.     Comments: PLOF taken from previous notes, pt unable to answer questions at this time     Hand Dominance        Extremity/Trunk Assessment   Upper Extremity Assessment Upper Extremity Assessment: Generalized weakness (unable to raise arm against gravity, showed <2/5 strength with elbow flx/ext b/l)    Lower Extremity Assessment Lower Extremity Assessment: Generalized weakness (unable to give any resistance with any LE strength testing, groans in pain with even minimal activity)       Communication  Communication: Other (comment) (unable to answer any questions other than his name)  Cognition Arousal/Alertness: Lethargic Behavior During Therapy: Flat affect Overall Cognitive Status: Difficult to assess                                 General Comments: Pt struggled with even minimal interaction.  Eyes closing and/or falling asleep consistently t/o the session      General Comments      Exercises     Assessment/Plan    PT Assessment  (per progress, will maintain on 3 day trial to assess appropriateness)  PT Problem List Decreased strength;Decreased activity tolerance;Decreased balance;Decreased  mobility;Decreased safety awareness;Decreased knowledge of precautions       PT Treatment Interventions DME instruction;Gait training;Functional mobility training;Therapeutic activities;Therapeutic exercise;Balance training;Neuromuscular re-education    PT Goals (Current goals can be found in the Care Plan section)  Acute Rehab PT Goals Patient Stated Goal: unable to state PT Goal Formulation: Patient unable to participate in goal setting Time For Goal Achievement: 01/28/21 Potential to Achieve Goals: Poor    Frequency  (3 day trial)   Barriers to discharge Decreased caregiver support      Co-evaluation               AM-PAC PT "6 Clicks" Mobility  Outcome Measure Help needed turning from your back to your side while in a flat bed without using bedrails?: Total Help needed moving from lying on your back to sitting on the side of a flat bed without using bedrails?: Total Help needed moving to and from a bed to a chair (including a wheelchair)?: Total Help needed standing up from a chair using your arms (e.g., wheelchair or bedside chair)?: Total Help needed to walk in hospital room?: Total Help needed climbing 3-5 steps with a railing? : Total 6 Click Score: 6    End of Session   Activity Tolerance: Patient limited by lethargy;Patient limited by pain Patient left: with call bell/phone within reach;with bed alarm set Nurse Communication: Mobility status PT Visit Diagnosis: Unsteadiness on feet (R26.81);Muscle weakness (generalized) (M62.81);Difficulty in walking, not elsewhere classified (R26.2)    Time: 8938-1017 PT Time Calculation (min) (ACUTE ONLY): 19 min   Charges:   PT Evaluation $PT Re-evaluation: 1 Re-eval          Kreg Shropshire, DPT 01/14/2021, 6:12 PM

## 2021-01-15 DIAGNOSIS — R410 Disorientation, unspecified: Secondary | ICD-10-CM | POA: Diagnosis not present

## 2021-01-15 LAB — CBC
HCT: 40.9 % (ref 39.0–52.0)
Hemoglobin: 13.2 g/dL (ref 13.0–17.0)
MCH: 29.7 pg (ref 26.0–34.0)
MCHC: 32.3 g/dL (ref 30.0–36.0)
MCV: 91.9 fL (ref 80.0–100.0)
Platelets: 229 10*3/uL (ref 150–400)
RBC: 4.45 MIL/uL (ref 4.22–5.81)
RDW: 18.8 % — ABNORMAL HIGH (ref 11.5–15.5)
WBC: 7.7 10*3/uL (ref 4.0–10.5)
nRBC: 0.3 % — ABNORMAL HIGH (ref 0.0–0.2)

## 2021-01-15 LAB — GLUCOSE, CAPILLARY
Glucose-Capillary: 93 mg/dL (ref 70–99)
Glucose-Capillary: 117 mg/dL — ABNORMAL HIGH (ref 70–99)
Glucose-Capillary: 102 mg/dL — ABNORMAL HIGH (ref 70–99)
Glucose-Capillary: 105 mg/dL — ABNORMAL HIGH (ref 70–99)

## 2021-01-15 LAB — BASIC METABOLIC PANEL
Anion gap: 12 (ref 5–15)
BUN: 28 mg/dL — ABNORMAL HIGH (ref 8–23)
CO2: 22 mmol/L (ref 22–32)
Calcium: 9.3 mg/dL (ref 8.9–10.3)
Chloride: 104 mmol/L (ref 98–111)
Creatinine, Ser: 1.01 mg/dL (ref 0.61–1.24)
GFR, Estimated: >60 mL/min (ref >60)
Glucose, Bld: 99 mg/dL (ref 70–99)
Potassium: 4.2 mmol/L (ref 3.5–5.1)
Sodium: 138 mmol/L (ref 135–145)

## 2021-01-15 LAB — MAGNESIUM: Magnesium: 2.7 mg/dL — ABNORMAL HIGH (ref 1.7–2.4)

## 2021-01-15 NOTE — Evaluation (Signed)
Occupational Therapy Evaluation Patient Details Name: Eddie Carter MRN: [id number] DOB: 1941/07/24 Today's Date: 01/15/2021    History of Present Illness 80 y.o. male with medical history significant of diet-controlled diabetes, COPD, asthma, stroke, GERD, hypothyroidism, depression, anxiety, OSA on CPAP, IBS, urethral stricture, diverticulitis, GI bleeding, BPH, interstitial lung disease, dementia, who presents with abdominal pain, chest pain, generalized weakness.  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   Clinical Impression   Eddie Carter was more alert today than during prior attempts at evaluation. His eyes remained opened through most of the session and he occasionally engaged in topic-appropriate conversation. He did not seem oriented to time and place, but he was able to report (correctly, apparently) that he had a visit from his lawyer yesterday. Eddie Carter identified having pain in his back and buttocks but could not quantify or describe the pain in any detail. He also appeared to have pain with any movement, grimacing and crying out during all attempts at bed mobility. Engaged in extensive conversation with son regarding D/C planning, with son commenting that he had received several calls from the palliative care service but that he had not been able to answer the phone when they called. 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 At this time, the pt was unable to engage in any functional mobility tasks, requiring Max/Total A for all ADL. We can offer a trial of OT services to see if pt progresses in the coming week, but, baring considerable improvement in his condition, he is unlikely to gain increased function from rehabilitation services. Comfort care, in whatever setting it can be offered, would likely best serve Eddie Carter.     Follow Up Recommendations   SNF;Other (comment) (hospice facility)    Equipment Recommendations       Recommendations for Other Services  (palliative care/hospice consult)     Precautions / Restrictions Precautions Precautions: Fall Restrictions Weight Bearing Restrictions: No      Mobility Bed Mobility Overal bed mobility: Needs Assistance Bed Mobility: Rolling;Sidelying to Sit;Sit to Sidelying Rolling: Max assist Sidelying to sit: Max assist     Sit to sidelying: Max assist      Transfers Overall transfer level: Needs assistance               General transfer comment: Transfer not attempted, due to pt's lethargy, weakness, pain with movement    Balance     Sitting balance-Leahy Scale: Poor Sitting balance - Comments: pt requiring Max A for bed mobility and balance     Standing balance-Leahy Scale: Zero                             ADL either performed or assessed with clinical judgement   ADL Overall ADL's : Needs assistance/impaired Eating/Feeding: Maximal assistance   Grooming: Maximal assistance                                 General ADL Comments: grimacing and crying out with any attempts to engage in bed-level ADL     Vision Baseline Vision/History: Wears glasses Wears Glasses: At all times Patient Visual Report: No change from baseline       Perception     Praxis      Pertinent Vitals/Pain Faces  Pain Scale: Hurts even more Pain Location: Pt reports pain in back and buttocks Pain Intervention(s): Repositioned;Relaxation;Monitored during session;Limited activity within patient's tolerance     Hand Dominance Right   Extremity/Trunk Assessment Upper Extremity Assessment Upper Extremity Assessment: Generalized weakness   Lower Extremity Assessment Lower Extremity Assessment: Generalized weakness       Communication Communication Communication: Expressive difficulties   Cognition Arousal/Alertness: Lethargic Behavior During  Therapy: Anxious;Flat affect Overall Cognitive Status: Difficult to assess                                 General Comments: Pt more engaged today than during previous sessions. Participated in conversation, although comments often non-sensical.   General Comments  new areas of skin breakdown on b/l heels, dorsum of R foot    Exercises Other Exercises Other Exercises: Discussion with pt and son re: goals of care, D/C options. Reposition for comfort and to address skin breakdown   Shoulder Instructions      Home Living Family/patient expects to be discharged to:: Private residence Living Arrangements: Alone Available Help at Discharge: Family Type of Home: House Home Access: [place]: One level     Bathroom Shower/Tub: Occupational psychologist: Handicapped height Bathroom Accessibility: Yes   Home Equipment: Environmental consultant - 2 wheels;Walker - 4 wheels;Bedside commode;Shower seat;Wheelchair - power          Prior Functioning/Environment Level of Independence: Needs assistance  Gait / Transfers Assistance Needed: patient uses a wheelchair for primary means of mobility. ADL's / Homemaking Assistance Needed: patient reports difficulty with ADLs but performing independently   Comments: PLOF taken from previous notes, pt unable to answer questions at this time        OT Problem List: Decreased strength;Decreased activity tolerance;Cardiopulmonary status limiting activity;Obesity;Pain;Decreased range of motion;Impaired balance (sitting and/or standing)      OT Treatment/Interventions: Self-care/ADL training;DME and/or AE instruction;Therapeutic activities;Balance training;Therapeutic exercise;Energy conservation;Patient/family education    OT Goals(Current goals can be found in the care plan section) Acute Rehab OT Goals Patient Stated Goal: to be comfortable OT Goal Formulation: With patient Time For Goal Achievement:  01/18/21 Potential to Achieve Goals: Good ADL Goals Pt Will Perform Eating: with mod assist;bed level;sitting Additional ADL Goal #1: Pt will be able to engage in bed mobility to prevent skin breakdown and discomfort  OT Frequency: Min 1X/week   Barriers to D/C: Decreased caregiver support          Co-evaluation              AM-PAC OT "6 Clicks" Daily Activity     Outcome Measure Help from another person eating meals?: A Little Help from another person taking care of personal grooming?: A Lot Help from another person toileting, which includes using toliet, bedpan, or urinal?: A Lot Help from another person bathing (including washing, rinsing, drying)?: A Lot Help from another person to put on and taking off regular upper body clothing?: A Lot Help from another person to put on and taking off regular lower body clothing?: A Lot 6 Click Score: 13   End of Session    Activity Tolerance: Patient limited by lethargy;Patient limited by pain Patient left: in bed;with family/visitor present  OT Visit Diagnosis: Muscle weakness (generalized) (M62.81);Pain;Unsteadiness on feet (R26.81)                Time: 3825-0539 OT Time Calculation (  min): 36 min Charges:  OT General Charges $OT Visit: 1 Visit OT Evaluation $OT Eval Moderate Complexity: 1 Mod OT Treatments $Self Care/Home Management : 23-37 mins  Josiah Lobo, PhD, MS, OTR/L ascom [phone] 01/15/21, 1:00 PM

## 2021-01-15 NOTE — Plan of Care (Signed)
  Problem: Education: Goal: Knowledge of General Education information will improve Description: Including pain rating scale, medication(s)/side effects and non-pharmacologic comfort measures 01/15/2021 1506 by Cristela Blue, RN Outcome: Progressing 01/15/2021 1506 by Cristela Blue, RN Outcome: Progressing   Problem: Health Behavior/Discharge Planning: Goal: Ability to manage health-related needs will improve 01/15/2021 1506 by Cristela Blue, RN Outcome: Progressing 01/15/2021 1506 by Cristela Blue, RN Outcome: Progressing   Problem: Clinical Measurements: Goal: Ability to maintain clinical measurements within normal limits will improve 01/15/2021 1506 by Cristela Blue, RN Outcome: Progressing 01/15/2021 1506 by Cristela Blue, RN Outcome: Progressing Goal: Will remain free from infection 01/15/2021 1506 by Cristela Blue, RN Outcome: Progressing 01/15/2021 1506 by Cristela Blue, RN Outcome: Progressing Goal: Diagnostic test results will improve 01/15/2021 1506 by Cristela Blue, RN Outcome: Progressing 01/15/2021 1506 by Cristela Blue, RN Outcome: Progressing Goal: Respiratory complications will improve 01/15/2021 1506 by Cristela Blue, RN Outcome: Progressing 01/15/2021 1506 by Cristela Blue, RN Outcome: Progressing Goal: Cardiovascular complication will be avoided 01/15/2021 1506 by Cristela Blue, RN Outcome: Progressing 01/15/2021 1506 by Cristela Blue, RN Outcome: Progressing   Problem: Activity: Goal: Risk for activity intolerance will decrease 01/15/2021 1506 by Cristela Blue, RN Outcome: Progressing 01/15/2021 1506 by Cristela Blue, RN Outcome: Progressing   Problem: Nutrition: Goal: Adequate nutrition will be maintained 01/15/2021 1506 by Cristela Blue, RN Outcome: Progressing 01/15/2021 1506 by Cristela Blue, RN Outcome: Progressing   Problem: Coping: Goal: Level of anxiety will decrease 01/15/2021 1506 by Cristela Blue, RN Outcome:  Progressing 01/15/2021 1506 by Cristela Blue, RN Outcome: Progressing   Problem: Elimination: Goal: Will not experience complications related to bowel motility 01/15/2021 1506 by Cristela Blue, RN Outcome: Progressing 01/15/2021 1506 by Cristela Blue, RN Outcome: Progressing Goal: Will not experience complications related to urinary retention 01/15/2021 1506 by Cristela Blue, RN Outcome: Progressing 01/15/2021 1506 by Cristela Blue, RN Outcome: Progressing   Problem: Pain Managment: Goal: General experience of comfort will improve 01/15/2021 1506 by Cristela Blue, RN Outcome: Progressing 01/15/2021 1506 by Cristela Blue, RN Outcome: Progressing   Problem: Safety: Goal: Ability to remain free from injury will improve 01/15/2021 1506 by Cristela Blue, RN Outcome: Progressing 01/15/2021 1506 by Cristela Blue, RN Outcome: Progressing   Problem: Skin Integrity: Goal: Risk for impaired skin integrity will decrease 01/15/2021 1506 by Cristela Blue, RN Outcome: Progressing 01/15/2021 1506 by Cristela Blue, RN Outcome: Progressing

## 2021-01-15 NOTE — Progress Notes (Signed)
PROGRESS NOTE    Eddie Carter  NWG:[id number] DOB: 1941/03/28 DOA: 01/03/2021 PCP: [name], MD     Brief Narrative:  80 y.o.WM PMH Depression, Anxietyx DM type II, diet-controlled, COPD, Asthma, stroke, GERD, Hypothyroidism, , OSA on CPAP, IBS, urethral stricture, Diverticulitis, GI bleeding, BPH, Interstitial Lung disease, Dementia,   Presents with abdominal pain, chest pain, generalized weakness. CT abdomen/pelvis with contrast showed large left lower lobe mass with multiple liver metastasis.Liver biopsy performed on 2/14.   Subjective: Pt becoming more unresponsive.  Opens eyes briefly, but not responding to questions or commands.     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 -Elevated liver function due to liver metastasis. -2/14 s/p liver biopsy; awaiting results -Patient is followed by oncology, biopsy results not available. -Decrease pain medication secondary to decreased cognition -2/16 Echocardiogram pending.  Patient with metastatic cancer -2/18 metastatic neuroendocrine tumor see results below.  Oncology on board, palliative care on board.  Son is an NP at  [facility] ED  --Health care POA son [name] changed goals of care on 2/21 and wanted to keep pt alive.  Hospice referral therefore canceled.  Interstitial lung disease/COPD --not in exacerbation --cont Dulera and Spiriva  Lactic acidosis persistent 2/2 metastatic cancer -2/16 D5-0.9% saline 143ml/hr -2/17 increasing lactic acidosis bolus normal saline 142ml/hr -2/18 even with aggressive hydration lactic acidosis remains.  Acute metabolic encephalopathy Baseline dementia --Pt getting progressively more lethargic, not responsive, and decreased oral intake.  Encephalopathy likely due to cancer process, persistent lactic acidosis, poor oral intake, etc. -2/18 per palliative care recommendation started modafinil --oxycodone d/c'ed, per son's request, to attempt to improve pt's somnolence  Severe suicidal thoughts Depression -psych consulted and following --cont Remeron and Modafinil  Failure to thrive/Anorexia Hypoglycemia -Megace 400 mg daily --cont Remeron --BG checks ACHS to monitor for hypoglycemia --Family does not want tube feed currently  DM type II controlled without complication --V7Q= 5.0 --BG have been wnl --no need for SSI  Elevated liver enzymes -Secondary to metastatic cancer.  This along with patient's increasing lactic acidosis despite aggressive treatment poor prognostic factor for survival  BPH --cont home dutasteride and flomax  Goals of care -2/17 palliative care; 2/17 spoke NP [name] (son) spoke at length with son and he expressed wish that his father be comfortable and passed with dignity.  Would like palliative care to become involved discussed options. -2/18 SNF not a realistic option as patient unable to participate in any meaningful rehab.     --Health care POA son [name] changed goals of care on 2/21 and wanted to keep pt alive.  Hospice referral therefore canceled.   DVT prophylaxis:  Code Status: DNR Family  Communication:  Status is:  Inpatient  Dispo: The patient is from: Home              Anticipated d/c is to: undetermined              Anticipated d/c date is: undetermined, due to no safe discharge plan.              Consultants:  Psychiatry Oncology  Procedures/Significant Events:  2/14 LEFT liver lobe biopsy;POORLY DIFFERENTIATED CARCINOMA WITH NEUROENDOCRINE DIFFERENTIATION AND EXTENSIVE NECROSIS 2/16 Echocardiogram;Left Ventricle: LVEF=60 to 65%.-. Left ventricular diastolic function  could not be evaluated.     Cultures   Antimicrobials: Anti-infectives (From admission, onward)   None        Continuous Infusions:    Objective: Vitals:   01/15/21 0012 01/15/21 0425 01/15/21 0724 01/15/21 1131  BP: 134/71 133/66 117/64 125/62  Pulse: 96 94 93 90  Resp: 20 (!) 21 17 17   Temp: 99.7 F (37.6 C) 98.9 F (37.2 C) 99.6 F (37.6 C) 99.2 F (37.3 C)  TempSrc:  Oral Oral Oral  SpO2: 97% 94% 96% 97%  Weight:      Height:       No intake or output data in the 24 hours ending 01/15/21 1322 Filed Weights   01/03/21 1104  Weight: 117.9 kg    Examination:  Constitutional: NAD, somnolent, not responding to questions or commands HEENT: conjunctivae and lids normal, EOMI CV: No cyanosis.   RESP: no distress, on 2L Extremities: mild swelling in BLE and feet SKIN: warm, dry.  Dressing over a popped blister on dorsum of right foot    CBC: Recent Labs  Lab 01/09/21 0714 01/10/21 0550 01/11/21 0410 01/12/21 0338 01/13/21 0441 01/15/21 0435  WBC 9.7 12.3* 11.4* 10.3 10.3 7.7  NEUTROABS 7.0 9.8* 9.0*  --   --   --   HGB 12.5* 12.9* 12.5* 12.6* 13.2 13.2  HCT 39.2 39.8 39.1 38.8* 40.0 40.9  MCV 94.2 93.4 93.1 93.0 92.6 91.9  PLT 164 152 150 151 179 527   Basic Metabolic Panel: Recent Labs  Lab 01/09/21 0714 01/10/21 0550 01/11/21 0410 01/12/21 0338 01/13/21 0441 01/15/21 0435  NA 139 139 138 137 136 138  K 3.8 3.9 4.0 4.1 4.2 4.2  CL 103 105 106 106 104 104  CO2 25 24 25 22  22 22   GLUCOSE 117* 102* 94 69* 63* 99  BUN 19 15 14 14 16  28*  CREATININE 0.93 0.77 0.73 0.76 0.74 1.01  CALCIUM 9.3 9.2 8.8* 9.0 8.9 9.3  MG 2.4 2.2 2.2 2.3 2.3 2.7*  PHOS 2.9 3.2 3.0  --   --   --    GFR: Estimated Creatinine Clearance: 79.9 mL/min (by C-G formula based on SCr of 1.01 mg/dL). Liver Function Tests: Recent Labs  Lab 01/09/21 0714 01/10/21 0550 01/11/21 0410  AST 224* 198* 216*  ALT 73* 69* 75*  ALKPHOS 491* 477* 445*  BILITOT 2.5* 2.1* 2.3*  PROT 6.2* 5.9* 5.8*  ALBUMIN 2.5* 2.3* 2.2*   No results for input(s): LIPASE, AMYLASE in the last 168 hours. Recent Labs  Lab 01/09/21 1322 01/10/21 0550 01/11/21 0410  AMMONIA 42* 41* 49*   Coagulation Profile: No results for input(s): INR, PROTIME in the last 168 hours. Cardiac Enzymes: No results for input(s): CKTOTAL, CKMB, CKMBINDEX, TROPONINI in the last 168 hours. BNP (last 3 results) No results for input(s): PROBNP in the last 8760 hours. HbA1C: No results for input(s): HGBA1C in  the last 72 hours. CBG: Recent Labs  Lab 01/14/21 1139 01/14/21 1550 01/14/21 2038 01/15/21 0739 01/15/21 1229  GLUCAP 99 121* 101* 93 117*   Lipid Profile: No results for input(s): CHOL, HDL, LDLCALC, TRIG, CHOLHDL, LDLDIRECT in the last 72 hours. Thyroid Function Tests: No results for input(s): TSH, T4TOTAL, FREET4, T3FREE, THYROIDAB in the last 72 hours. Anemia Panel: No results for input(s): VITAMINB12, FOLATE, FERRITIN, TIBC, IRON, RETICCTPCT in the last 72 hours. Sepsis Labs: Recent Labs  Lab 01/09/21 0848 01/09/21 1116 01/10/21 0947 01/10/21 1601  LATICACIDVEN 4.1* 4.2* 4.2* 4.1*    No results found for this or any previous visit (from the past 240 hour(s)).       Radiology Studies: No results found.      Scheduled Meds: . cholecalciferol  1,000 Units Oral Daily  . donepezil  10 mg Oral QHS  . dutasteride  0.5 mg Oral Daily  . enoxaparin (LOVENOX) injection  0.5 mg/kg Subcutaneous Q24H  .  feeding supplement  237 mL Oral TID BM  . fluticasone  1 spray Each Nare Daily  . hydrOXYzine  20 mg Oral QHS  . levothyroxine  50 mcg Oral Q0600  . megestrol  400 mg Oral Daily  . melatonin  3 mg Oral QHS  . mirtazapine  15 mg Oral QHS  . modafinil  100 mg Oral Daily  . mometasone-formoterol  2 puff Inhalation BID  . multivitamin with minerals  1 tablet Oral Daily  . senna-docusate  2 tablet Oral BID  . tamsulosin  0.4 mg Oral Daily  . tiotropium  18 mcg Inhalation Daily  . topiramate  50 mg Oral BID  . vitamin B-12  1,000 mcg Oral Daily   Continuous Infusions:    LOS: 12 days     Enzo Bi, MD [facility]   If 7PM-7AM, please contact night-coverage 01/15/2021, 1:22 PM

## 2021-01-16 ENCOUNTER — Other Ambulatory Visit: Payer: Medicare PPO

## 2021-01-16 DIAGNOSIS — R410 Disorientation, unspecified: Secondary | ICD-10-CM | POA: Diagnosis not present

## 2021-01-16 LAB — BASIC METABOLIC PANEL
Anion gap: 11 (ref 5–15)
BUN: 30 mg/dL — ABNORMAL HIGH (ref 8–23)
CO2: 24 mmol/L (ref 22–32)
Calcium: 9.3 mg/dL (ref 8.9–10.3)
Chloride: 103 mmol/L (ref 98–111)
Creatinine, Ser: 0.87 mg/dL (ref 0.61–1.24)
GFR, Estimated: >60 mL/min (ref >60)
Glucose, Bld: 84 mg/dL (ref 70–99)
Potassium: 3.7 mmol/L (ref 3.5–5.1)
Sodium: 138 mmol/L (ref 135–145)

## 2021-01-16 LAB — CBC
HCT: 38.2 % — ABNORMAL LOW (ref 39.0–52.0)
Hemoglobin: 12.3 g/dL — ABNORMAL LOW (ref 13.0–17.0)
MCH: 29.8 pg (ref 26.0–34.0)
MCHC: 32.2 g/dL (ref 30.0–36.0)
MCV: 92.5 fL (ref 80.0–100.0)
Platelets: 203 10*3/uL (ref 150–400)
RBC: 4.13 MIL/uL — ABNORMAL LOW (ref 4.22–5.81)
RDW: 18.6 % — ABNORMAL HIGH (ref 11.5–15.5)
WBC: 10.2 10*3/uL (ref 4.0–10.5)
nRBC: 0 % (ref 0.0–0.2)

## 2021-01-16 LAB — GLUCOSE, CAPILLARY
Glucose-Capillary: 91 mg/dL (ref 70–99)
Glucose-Capillary: 75 mg/dL (ref 70–99)

## 2021-01-16 LAB — MAGNESIUM: Magnesium: 2.7 mg/dL — ABNORMAL HIGH (ref 1.7–2.4)

## 2021-01-16 NOTE — Progress Notes (Signed)
Tumor Board Documentation  Eddie Carter was presented by Dr Janese Banks at our Tumor Board on 01/16/2021, which included representatives from medical oncology,radiation oncology,internal medicine,navigation,pathology,radiology,surgical,pharmacy,genetics,palliative care,pulmonology.  Eddie Carter currently presents as a new patient,for MDC,for new positive pathology with history of the following treatments: surgical intervention(s).  Additionally, we reviewed previous medical and familial history, history of present illness, and recent lab results along with all available histopathologic and imaging studies. The tumor board considered available treatment options and made the following recommendations:   Home hospice vs Hospice Home  The following procedures/referrals were also placed: No orders of the defined types were placed in this encounter.   Clinical Trial Status: not discussed   Staging used: Pathologic Stage  AJCC Staging:       Group: Stage IV NET of Liver   National site-specific guidelines   were discussed with respect to the case.  Tumor board is a meeting of clinicians from various specialty areas who evaluate and discuss patients for whom a multidisciplinary approach is being considered. Final determinations in the plan of care are those of the provider(s). The responsibility for follow up of recommendations given during tumor board is that of the provider.   Today's extended care, comprehensive team conference, Eddie Carter was not present for the discussion and was not examined.   Multidisciplinary Tumor Board is a multidisciplinary case peer review process.  Decisions discussed in the Multidisciplinary Tumor Board reflect the opinions of the specialists present at the conference without having examined the patient.  Ultimately, treatment and diagnostic decisions rest with the primary provider(s) and the patient.

## 2021-01-16 NOTE — Progress Notes (Signed)
PROGRESS NOTE    Eddie Carter  HAL:[id number] DOB: September 16, 1941 DOA: 01/03/2021 PCP: [name], MD     Brief Narrative:  80 y.o.WM PMH Depression, Anxietyx DM type II, diet-controlled, COPD, Asthma, stroke, GERD, Hypothyroidism, , OSA on CPAP, IBS, urethral stricture, Diverticulitis, GI bleeding, BPH, Interstitial Lung disease, Dementia,   Presents with abdominal pain, chest pain, generalized weakness. CT abdomen/pelvis with contrast showed large left lower lobe mass with multiple liver metastasis.Liver biopsy performed on 2/14.   Subjective: Today, pt found to be more alert and ate more.  Admitted to dyspnea.   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 -Elevated liver function due to liver metastasis. -2/14 s/p liver biopsy; awaiting results -Patient is followed by oncology, biopsy results not available. -Decrease pain medication secondary to decreased cognition -2/16 Echocardiogram pending.  Patient with metastatic cancer -2/18 metastatic neuroendocrine tumor see results below.  Oncology on board, palliative care on board.  Son is an NP at [facility] ED  --Health care  POA son [name] changed goals of care on 2/21 and wanted to keep pt alive.  Hospice referral therefore canceled.  Interstitial lung disease/COPD --not in exacerbation --cont Dulera and Spiriva  Lactic acidosis persistent 2/2 metastatic cancer -2/16 D5-0.9% saline 153ml/hr -2/17 increasing lactic acidosis bolus normal saline 120ml/hr -2/18 even with aggressive hydration lactic acidosis remains.  Acute metabolic encephalopathy Baseline dementia --Pt getting progressively more lethargic, not responsive, and decreased oral intake.  Encephalopathy likely due to cancer process, persistent lactic acidosis, poor oral intake, etc. -2/18 per palliative care recommendation started modafinil --oxycodone d/c'ed, per son's request, to attempt to improve pt's somnolence  Severe suicidal thoughts Depression -psych consulted and following --cont Remeron and Modafinil  Failure to thrive/Anorexia Hypoglycemia -Megace 400 mg daily --cont Remeron --BG checks ACHS to monitor for hypoglycemia --Family does not want tube feed currently  DM type II controlled without complication --B3Z= 5.0 --BG have been wnl --no need for SSI  Elevated liver enzymes -Secondary to metastatic cancer.  This along with patient's increasing lactic acidosis despite aggressive treatment poor prognostic factor for survival  BPH --cont home dutasteride and flomax  Goals of care -2/17 palliative care; 2/17 spoke NP [name] (son) spoke at length with son and he expressed wish that his father be comfortable and passed with dignity.  Would like palliative care to become involved discussed options. -2/18 SNF not a realistic option as patient unable to participate in any meaningful rehab.     --Health care POA son [name] changed goals of care on 2/21 and wanted to keep pt alive.  Hospice referral therefore canceled.   DVT prophylaxis:  Code Status: DNR Family Communication:  Status  is: Inpatient  Dispo: The patient is from:  Home              Anticipated d/c is to: undetermined              Anticipated d/c date is: undetermined, due to no safe discharge plan.              Consultants:  Psychiatry Oncology  Procedures/Significant Events:  2/14 LEFT liver lobe biopsy;POORLY DIFFERENTIATED CARCINOMA WITH NEUROENDOCRINE DIFFERENTIATION AND EXTENSIVE NECROSIS 2/16 Echocardiogram;Left Ventricle: LVEF=60 to 65%.-. Left ventricular diastolic function  could not be evaluated.     Cultures   Antimicrobials: Anti-infectives (From admission, onward)   None        Continuous Infusions:    Objective: Vitals:   01/16/21 0001 01/16/21 0420 01/16/21 0821 01/16/21 1117  BP: (!) 127/59 118/62 138/71 124/65  Pulse: 87 79 80 83  Resp: 20 16 17 17   Temp: 97.6 F (36.4 C) 97.9 F (36.6 C) (!) 97.5 F (36.4 C) (!) 97.4 F (36.3 C)  TempSrc: Oral Oral Oral Oral  SpO2: 95% 98% 99% 95%  Weight:      Height:        Intake/Output Summary (Last 24 hours) at 01/16/2021 1445 Last data filed at 01/16/2021 0214 Gross per 24 hour  Intake -  Output 300 ml  Net -300 ml   Filed Weights   01/03/21 1104  Weight: 117.9 kg    Examination:  Constitutional: NAD, lethargic, not very responsive HEENT: conjunctivae and lids normal, EOMI CV: No cyanosis.   RESP: no distress, [place] off Extremities: 2+ pitting edema in BLE SKIN: warm, dry   CBC: Recent Labs  Lab 01/10/21 0550 01/11/21 0410 01/12/21 0338 01/13/21 0441 01/15/21 0435 01/16/21 1039  WBC 12.3* 11.4* 10.3 10.3 7.7 10.2  NEUTROABS 9.8* 9.0*  --   --   --   --   HGB 12.9* 12.5* 12.6* 13.2 13.2 12.3*  HCT 39.8 39.1 38.8* 40.0 40.9 38.2*  MCV 93.4 93.1 93.0 92.6 91.9 92.5  PLT 152 150 151 179 229 354   Basic Metabolic Panel: Recent Labs  Lab 01/10/21 0550 01/11/21 0410 01/12/21 0338 01/13/21 0441 01/15/21 0435 01/16/21 1039  NA 139 138 137 136 138 138  K 3.9 4.0 4.1 4.2 4.2 3.7  CL 105 106 106 104 104 103  CO2 24 25 22 22 22 24   GLUCOSE  102* 94 69* 63* 99 84  BUN 15 14 14 16  28* 30*  CREATININE 0.77 0.73 0.76 0.74 1.01 0.87  CALCIUM 9.2 8.8* 9.0 8.9 9.3 9.3  MG 2.2 2.2 2.3 2.3 2.7* 2.7*  PHOS 3.2 3.0  --   --   --   --    GFR: Estimated Creatinine Clearance: 92.7 mL/min (by C-G formula based on SCr of 0.87 mg/dL). Liver Function Tests: Recent Labs  Lab 01/10/21 0550 01/11/21 0410  AST 198* 216*  ALT 69* 75*  ALKPHOS 477* 445*  BILITOT 2.1* 2.3*  PROT 5.9* 5.8*  ALBUMIN 2.3* 2.2*   No results for input(s): LIPASE, AMYLASE in the last 168 hours. Recent Labs  Lab 01/10/21 0550 01/11/21 0410  AMMONIA 41* 49*   Coagulation Profile: No results for input(s): INR, PROTIME in the last 168 hours. Cardiac Enzymes: No results for input(s): CKTOTAL, CKMB, CKMBINDEX, TROPONINI in the last 168 hours. BNP (last 3 results) No results for input(s): PROBNP in the last 8760 hours. HbA1C: No results for input(s): HGBA1C in the last 72 hours.  CBG: Recent Labs  Lab 01/15/21 1229 01/15/21 1606 01/15/21 2146 01/16/21 0823 01/16/21 1115  GLUCAP 117* 102* 105* 91 75   Lipid Profile: No results for input(s): CHOL, HDL, LDLCALC, TRIG, CHOLHDL, LDLDIRECT in the last 72 hours. Thyroid Function Tests: No results for input(s): TSH, T4TOTAL, FREET4, T3FREE, THYROIDAB in the last 72 hours. Anemia Panel: No results for input(s): VITAMINB12, FOLATE, FERRITIN, TIBC, IRON, RETICCTPCT in the last 72 hours. Sepsis Labs: Recent Labs  Lab 01/10/21 0947 01/10/21 1601  LATICACIDVEN 4.2* 4.1*    No results found for this or any previous visit (from the past 240 hour(s)).       Radiology Studies: No results found.      Scheduled Meds: . cholecalciferol  1,000 Units Oral Daily  . donepezil  10 mg Oral QHS  . dutasteride  0.5 mg Oral Daily  . enoxaparin (LOVENOX) injection  0.5 mg/kg Subcutaneous Q24H  . feeding supplement  237 mL Oral TID BM  . fluticasone  1 spray Each Nare Daily  . hydrOXYzine  20 mg Oral QHS  .  levothyroxine  50 mcg Oral Q0600  . megestrol  400 mg Oral Daily  . melatonin  3 mg Oral QHS  . mirtazapine  15 mg Oral QHS  . modafinil  100 mg Oral Daily  . mometasone-formoterol  2 puff Inhalation BID  . multivitamin with minerals  1 tablet Oral Daily  . senna-docusate  2 tablet Oral BID  . tamsulosin  0.4 mg Oral Daily  . tiotropium  18 mcg Inhalation Daily  . topiramate  50 mg Oral BID  . vitamin B-12  1,000 mcg Oral Daily   Continuous Infusions:    LOS: 13 days     Enzo Bi, MD [facility]   If 7PM-7AM, please contact night-coverage 01/16/2021, 2:45 PM

## 2021-01-16 NOTE — Progress Notes (Signed)
Nutrition Follow-up  DOCUMENTATION CODES:   Obesity unspecified  INTERVENTION:  Continue Ensure Enlive po TID, each supplement provides 350 kcal and 20 grams of protein.  Provide Magic cup TID with meals, each supplement provides 290 kcal and 9 grams of protein.  Continue MVI po daily.  NUTRITION DIAGNOSIS:   Inadequate oral intake related to poor appetite,nausea as evidenced by per patient/family report.  Ongoing.  GOAL:   Patient will meet greater than or equal to 90% of their needs  Not met.  MONITOR:   Labs,I & O's,Diet advancement,Supplement acceptance,PO intake,Weight trends,Skin  REASON FOR ASSESSMENT:   Malnutrition Screening Tool    ASSESSMENT:   80 year old male admitted with lung mass. Hx of diet-controlled DM, COPD, asthma, GERD, hypothyroidism, depression, anxiety, OSA on CPAP, IBS, urethral stricture, diverticulitis, GIB, BPH, ILD, dementia, presents with 3 week onset of abdominal pain associated with nausea without vomiting, poor oral intake, and right sided pleuritic chest pain.   Patient with stage IV neuroendocrine tumor liver  Attempted to meet with patient at bedside. He was very sleepy. He briefly awoke to name call but quickly fell back asleep and was unable to answer any questions. Limited documentation of intake available in chart but of what is documented recently patient ate 0% of breakfast on 2/22 and 20% of lunch yesterday. Discussed with RN. Patient has days where he is more lethargic and cannot eat and then other days where he is more alert and can have a small amount of food. Lunch tray was untouched at time of RD assessment. RN reports patient ate some this morning. Family does not want a feeding tube at this time.  Medications reviewed and include: vitamin D 1000 units daily, levothyroxine, Megace 400 mg daily, Remeron 15 mg QHS, MVI daily, senna-docusate 2 tablets BID, vitamin B12 1000 micrograms daily.  Labs reviewed: CBG 75-105,  Magnesium 2.7.  I/O: 300 mL UOP yesterday + 1 occurrence unmeasured UOP  No weight to trend since 2/11  Diet Order:   Diet Order            Diet regular Room service appropriate? Yes; Fluid consistency: Thin  Diet effective now                EDUCATION NEEDS:   Not appropriate for education at this time  Skin:  Skin Assessment: Reviewed RN Assessment  Last BM:  01/13/2021 per chart  Height:   Ht Readings from Last 1 Encounters:  01/03/21 6' (1.829 m)   Weight:   Wt Readings from Last 1 Encounters:  01/03/21 117.9 kg   Ideal Body Weight:  80.9 kg  BMI:  Body mass index is 35.26 kg/m.  Estimated Nutritional Needs:   Kcal:  2500-2700  Protein:  130-145  Fluid:  >/= 2.5 L  Jacklynn Barnacle, MS, RD, LDN Pager number available on Amion

## 2021-01-17 DIAGNOSIS — R410 Disorientation, unspecified: Secondary | ICD-10-CM | POA: Diagnosis not present

## 2021-01-17 LAB — BASIC METABOLIC PANEL
Anion gap: 10 (ref 5–15)
BUN: 27 mg/dL — ABNORMAL HIGH (ref 8–23)
CO2: 25 mmol/L (ref 22–32)
Calcium: 9.2 mg/dL (ref 8.9–10.3)
Chloride: 105 mmol/L (ref 98–111)
Creatinine, Ser: 0.72 mg/dL (ref 0.61–1.24)
GFR, Estimated: >60 mL/min (ref >60)
Glucose, Bld: 75 mg/dL (ref 70–99)
Potassium: 3.7 mmol/L (ref 3.5–5.1)
Sodium: 140 mmol/L (ref 135–145)

## 2021-01-17 LAB — GLUCOSE, CAPILLARY
Glucose-Capillary: 107 mg/dL — ABNORMAL HIGH (ref 70–99)
Glucose-Capillary: 68 mg/dL — ABNORMAL LOW (ref 70–99)
Glucose-Capillary: 68 mg/dL — ABNORMAL LOW (ref 70–99)
Glucose-Capillary: 73 mg/dL (ref 70–99)
Glucose-Capillary: 79 mg/dL (ref 70–99)
Glucose-Capillary: 84 mg/dL (ref 70–99)
Glucose-Capillary: 88 mg/dL (ref 70–99)

## 2021-01-17 LAB — CBC
HCT: 37 % — ABNORMAL LOW (ref 39.0–52.0)
Hemoglobin: 11.9 g/dL — ABNORMAL LOW (ref 13.0–17.0)
MCH: 29.7 pg (ref 26.0–34.0)
MCHC: 32.2 g/dL (ref 30.0–36.0)
MCV: 92.3 fL (ref 80.0–100.0)
Platelets: 219 10*3/uL (ref 150–400)
RBC: 4.01 MIL/uL — ABNORMAL LOW (ref 4.22–5.81)
RDW: 18.5 % — ABNORMAL HIGH (ref 11.5–15.5)
WBC: 10.5 10*3/uL (ref 4.0–10.5)
nRBC: 0 % (ref 0.0–0.2)

## 2021-01-17 LAB — GLUCOSE, POCT (MANUAL RESULT ENTRY): POC Glucose: 117 mg/dl — AB (ref 70–99)

## 2021-01-17 LAB — MAGNESIUM: Magnesium: 2.8 mg/dL — ABNORMAL HIGH (ref 1.7–2.4)

## 2021-01-17 MED ORDER — HYDROCODONE-ACETAMINOPHEN 5-325 MG PO TABS
1.0000 | ORAL_TABLET | Freq: Four times a day (QID) | ORAL | Status: DC | PRN
Start: 2021-01-17 — End: 2021-01-18
  Administered 2021-01-17 – 2021-01-18 (×3): 1 via ORAL
  Filled 2021-01-17 (×3): qty 1

## 2021-01-17 MED ORDER — DEXTROSE 50 % IV SOLN
INTRAVENOUS | Status: AC
  Administered 2021-01-17: 25 mL via INTRAMUSCULAR
  Filled 2021-01-17: qty 50

## 2021-01-17 NOTE — Progress Notes (Signed)
Occupational Therapy Treatment Patient Details Name: Eddie Carter MRN: [id number] DOB: [birth year] Today's Date: 01/17/2021    History of present illness 80 y.o. male with medical history significant of diet-controlled diabetes, COPD, asthma, stroke, GERD, hypothyroidism, depression, anxiety, OSA on CPAP, IBS, urethral stricture, diverticulitis, GI bleeding, BPH, interstitial lung disease, dementia, who presents with abdominal pain, chest pain, generalized weakness.  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   OT comments  Eddie Carter vacillated today between moments of lucidity and periods of confusion. He appeared calmer than in previous sessions, and today was the first time this therapist has worked with Eddie Carter when he did not complain of having pain. He was able to identify that he was thirsty, and he assisted in bringing a cup with straw to his mouth. Eddie Carter also participated in bed mobility (reaching and rolling only -- he was no longer able to come into sitting), trying to find the position that was most comfortable to him. He participated in grooming with hand-over-hand assist. He seemed more aware of his mortality today, at one point saying, "I've made mistakes in my life." When this therapist said to the pt that she would see him later, he replied, "Maybe it will be in another life." At end of session, Eddie Carter was able to say that he was "comfortable."    Follow Up Recommendations  Other (comment) (palliative/comfort care setting)    Equipment Recommendations  None recommended by OT    Recommendations for Other Services      Precautions / Restrictions Precautions Precautions: Fall Restrictions Weight Bearing Restrictions: No       Mobility Bed Mobility Overal bed mobility: Needs Assistance Bed Mobility: Rolling Rolling: Max assist              Transfers                 General transfer  comment: deferred    Balance     Sitting balance-Leahy Scale: Zero Sitting balance - Comments: Pt no longer able to come into sitting position     Standing balance-Leahy Scale: Zero                             ADL either performed or assessed with clinical judgement   ADL Overall ADL's : Needs assistance/impaired Eating/Feeding: Moderate assistance Eating/Feeding Details (indicate cue type and reason): drinking from straw Grooming: Maximal assistance Grooming Details (indicate cue type and reason): Hand of hand for grooming                                     Vision Patient Visual Report: No change from baseline     Perception     Praxis      Cognition Arousal/Alertness: Awake/alert;Lethargic   Overall Cognitive Status: Difficult to assess                                 General Comments: periods of alertness followed by periods of confusion        Exercises Other Exercises Other Exercises: bed mobility and repositioning for comfort and to address skin breakdown   Shoulder Instructions       General Comments heels elevated, breakdown on b/l heels now improved  Pertinent Vitals/ Pain       Pain Assessment: No/denies pain  Home Living                                          Prior Functioning/Environment              Frequency  Min 1X/week        Progress Toward Goals  OT Goals(current goals can now be found in the care plan section)  Progress towards OT goals: Progressing toward goals  Acute Rehab OT Goals Patient Stated Goal: to be comfortable OT Goal Formulation: With patient Time For Goal Achievement: 01/29/21 Potential to Achieve Goals: Good  Plan Frequency remains appropriate;Discharge plan remains appropriate    Co-evaluation                 AM-PAC OT "6 Clicks" Daily Activity     Outcome Measure   Help from another person eating meals?: A Lot Help from  another person taking care of personal grooming?: A Lot Help from another person toileting, which includes using toliet, bedpan, or urinal?: A Lot Help from another person bathing (including washing, rinsing, drying)?: A Lot Help from another person to put on and taking off regular upper body clothing?: A Lot Help from another person to put on and taking off regular lower body clothing?: A Lot 6 Click Score: 12    End of Session    OT Visit Diagnosis: Muscle weakness (generalized) (M62.81);Pain;Unsteadiness on feet (R26.81)   Activity Tolerance Treatment limited secondary to medical complications (Comment);Patient limited by fatigue   Patient Left in bed;with bed alarm set;with call bell/phone within reach   Nurse Communication          Time: 3817-7116 OT Time Calculation (min): 20 min  Charges: OT General Charges $OT Visit: 1 Visit OT Treatments $Self Care/Home Management : 8-22 mins  Josiah Lobo, PhD, [place], OTR/L ascom [phone] 01/17/21, 2:09 PM

## 2021-01-17 NOTE — Plan of Care (Signed)
End of Shift Summary:  Alert and oriented to self, reoriented as needed. Remained on 2L via [place], sats >95%. Pain managed with prn medications, denies n/v. Turned q2h for skin breakdown prevention. Heels elevated and bony prominences padded. PO intake encouraged. Pt remained free from falls or injury. Urine output adequate via Primofit. (-) BM, (-) flatus per pt. Bed low and in locked position, bed alarm on, call bell within reach and pt able to use.   Problem: Education: Goal: Knowledge of General Education information will improve Description: Including pain rating scale, medication(s)/side effects and non-pharmacologic comfort measures Outcome: Progressing   Problem: Health Behavior/Discharge Planning: Goal: Ability to manage health-related needs will improve Outcome: Not Progressing   Problem: Clinical Measurements: Goal: Ability to maintain clinical measurements within normal limits will improve Outcome: Progressing Goal: Will remain free from infection Outcome: Progressing Goal: Diagnostic test results will improve Outcome: Progressing Goal: Respiratory complications will improve Outcome: Progressing Goal: Cardiovascular complication will be avoided Outcome: Progressing   Problem: Activity: Goal: Risk for activity intolerance will decrease Outcome: Not Progressing   Problem: Nutrition: Goal: Adequate nutrition will be maintained Outcome: Not Progressing   Problem: Coping: Goal: Level of anxiety will decrease Outcome: Progressing   Problem: Pain Managment: Goal: General experience of comfort will improve Outcome: Progressing   Problem: Safety: Goal: Ability to remain free from injury will improve Outcome: Progressing   Problem: Skin Integrity: Goal: Risk for impaired skin integrity will decrease Outcome: Progressing

## 2021-01-17 NOTE — Progress Notes (Addendum)
[place] [facility] [place] [facility]) Hospital Liaison RN note:  Received request from Altha Harm, NP for family interest in [place]. Chart reviewed and eligibility has been approved. Spoke with son, [name] to confirm interest and explain services. He verbalized understanding. Hospice Home will have a room to offer tomorrow. Hospital care team is aware. [name] will complete registration paperwork at the [facility] tomorrow at [place] will reach out to [facility] to arrange transport.   Please call with any hospice related questions or concerns.   Thank you for the opportunity to participate in this patient's care.   Zandra Abts, RN [facility] Liaison [phone]

## 2021-01-17 NOTE — Progress Notes (Signed)
Hypoglycemic Event  CBG: 68 on 01/17/2021 at 1952  Treatment: dextrose 50% IV 74ml at 2001  Symptoms: asleep  Follow-up CBG: Time:2030 CBG Result:117  Possible Reasons for Event:   Comments/MD notified: followed hypoglycemia protocol   Quince Santana Silvestre Moment

## 2021-01-17 NOTE — Progress Notes (Signed)
I spoke with patient's son by phone.  Reportedly, patient was significantly more lucid yesterday.  He was interacting with family, making phone calls, eating, and joking with staff.  Reportedly, today, patient is again lethargic without clear etiology.  His mental status seems to wax and wane with good days and bad.  Son says that he would like patient evaluated for hospice facility.  He verbalized understanding that the philosophy of care at hospice would be focused on patient's comfort.  Discussed case with Kieth Brightly, liaison for hospice, who will speak with patient's son.  No charge note  Signed by: Altha Harm, PhD, NP-C

## 2021-01-17 NOTE — Progress Notes (Signed)
Physical Therapy Treatment Patient Details Name: Eddie Carter MRN: [id number] DOB: [birth year] Today's Date: 01/17/2021    History of Present Illness Furkan "Danelle Berry" Marhefka is a 59yoM with medical history significant of diet-controlled diabetes, COPD, asthma, stroke, GERD, hypothyroidism, depression, anxiety, OSA on CPAP, IBS, urethral stricture, diverticulitis, GI bleeding, BPH, interstitial lung disease, dementia, who presents with abdominal pain, chest pain, generalized weakness.  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    PT Comments    Pt in bed upon entry, appears awake, but minimally interactive with obvious delay. Pt is short on words, but does make needs known 2-3x. Pt does not initiate activity often, and struggles to attend to full sets of exercise, quickly moving from active assisted to P/ROM. Pt has improved comfort of back with bed reposition, but remains severely weak in general. Will continue to trial therapy services as mentation continues to fluctuate- remains unclear if pt will be able to participate in skilled PT services in meaningful way.   Follow Up Recommendations  Supervision for mobility/OOB;Supervision - Intermittent     Equipment Recommendations  None recommended by PT    Recommendations for Other Services       Precautions / Restrictions Precautions Precautions: Fall Restrictions Weight Bearing Restrictions: No    Mobility  Bed Mobility Overal bed mobility: Needs Assistance Bed Mobility:  (pulling up in bed for reposition: totalA+2) Rolling: Max assist              Transfers                 General transfer comment: deferred  Ambulation/Gait                 Stairs             Wheelchair Mobility    Modified Rankin (Stroke Patients Only)       Balance     Sitting balance-Leahy Scale: Zero Sitting balance - Comments: Pt no longer able to come into sitting  position     Standing balance-Leahy Scale: Zero                              Cognition Arousal/Alertness: Awake/alert Behavior During Therapy: Flat affect Overall Cognitive Status: Difficult to assess                                 General Comments: delayed processing and response time, follows simple commands ~50% of the time, but cannot attend to prolonged tasks (multiple reps of exercise)      Exercises General Exercises - Lower Extremity Ankle Circles/Pumps: PROM;Both;10 reps;AAROM;Supine Short Arc Quad: PROM;Both;AAROM;15 reps;Supine Heel Slides: PROM;Both;15 reps;Supine Hip Flexion/Marching: PROM;Both;15 reps;Supine Other Exercises Other Exercises: bed mobility and repositioning for comfort and to address skin breakdown    General Comments General comments (skin integrity, edema, etc.): heels elevated, breakdown on b/l heels now improved      Pertinent Vitals/Pain Pain Assessment: Faces Faces Pain Scale: Hurts even more Pain Location: back Pain Descriptors / Indicators: Aching Pain Intervention(s): Repositioned    Home Living                      Prior Function            PT Goals (current goals can now be found in the care plan section)  Acute Rehab PT Goals Patient Stated Goal: to be comfortable PT Goal Formulation: Patient unable to participate in goal setting Time For Goal Achievement: 01/28/21 Potential to Achieve Goals: Poor Progress towards PT goals: Not progressing toward goals - comment    Frequency    Min 2X/week (3 day trial)      PT Plan Current plan remains appropriate    Co-evaluation              AM-PAC PT "6 Clicks" Mobility   Outcome Measure  Help needed turning from your back to your side while in a flat bed without using bedrails?: Total Help needed moving from lying on your back to sitting on the side of a flat bed without using bedrails?: Total Help needed moving to and from a bed to  a chair (including a wheelchair)?: Total Help needed standing up from a chair using your arms (e.g., wheelchair or bedside chair)?: Total Help needed to walk in hospital room?: Total Help needed climbing 3-5 steps with a railing? : Total 6 Click Score: 6    End of Session   Activity Tolerance:  (no attending to task of exercise.) Patient left: with call bell/phone within reach;in bed;Other (comment) (chaired out) Nurse Communication: Mobility status PT Visit Diagnosis: Unsteadiness on feet (R26.81);Muscle weakness (generalized) (M62.81);Difficulty in walking, not elsewhere classified (R26.2)     Time: 4270-6237 PT Time Calculation (min) (ACUTE ONLY): 20 min  Charges:  $Therapeutic Exercise: 8-22 mins                     4:26 PM, 01/17/21 Etta Grandchild, PT, DPT Physical Therapist - [facility]  [phone] ([place])    [facility] 01/17/2021, 4:23 PM

## 2021-01-17 NOTE — TOC Progression Note (Signed)
Transition of Care [facility]) - Progression Note    Patient Details  Name: Eddie Carter MRN: [id number] Date of Birth: 04-13-1941  Transition of Care [facility]) CM/SW Contact  Shelbie Hutching, RN Phone Number: 01/17/2021, 3:59 PM  Clinical Narrative:    Altha Harm with Palliative has made a residential hospice referral to [facility].  Penny notified RNCM of referral.  Hospice home in [place] will have a bed available tomorrow.   Plan for DC to Hospice Home tomorrow.    Expected Discharge Plan: Hospice Medical Facility Barriers to Discharge: Other (comment) (referral sent to Hospice home)  Expected Discharge Plan and Services Expected Discharge Plan: [place]   Discharge Planning Services: CM Consult Post Acute Care Choice: Hospice Living arrangements for the past 2 months: Single Family Home                 DME Arranged: N/A         HH Arranged: NA           Social Determinants of Health (SDOH) Interventions    Readmission Risk Interventions No flowsheet data found.

## 2021-01-17 NOTE — TOC Transition Note (Signed)
Transition of Care [facility]) - CM/SW Discharge Note   Patient Details  Name: Eddie Carter MRN: [id number] Date of Birth: 1941-08-13  Transition of Care [facility]) CM/SW Contact:  Shelbie Hutching, RN Phone Number: 01/17/2021, 4:31 PM   Clinical Narrative:    Patient will discharge to the [facility] home in [place] tomorrow.  DC packet is ready for EMS, DNR in packet.  Transport arranged with [facility] for 1pm tomorrow 2/26.   Final next level of care: [place] Barriers to Discharge: No Barriers Identified   Patient Goals and CMS Choice Patient states their goals for this hospitalization and ongoing recovery are:: Son agrees to residential hospice CMS Medicare.gov Compare Post Acute Care list provided to:: Patient Represenative (must comment) Choice offered to / list presented to : Adult Children  Discharge Placement              Patient chooses bed at:  [facility]) Patient to be transferred to facility by: [facility] Name of family member notified: [name] (son) Patient and family notified of of transfer: 01/17/21  Discharge Plan and Services   Discharge Planning Services: CM Consult Post Acute Care Choice: Hospice          DME Arranged: N/A         HH Arranged: NA          Social Determinants of Health (SDOH) Interventions     Readmission Risk Interventions No flowsheet data found.

## 2021-01-17 NOTE — Progress Notes (Signed)
PROGRESS NOTE    Eddie Carter  ZOX:[id number] DOB: 1941/10/19 DOA: 01/03/2021 PCP: [name], MD     Brief Narrative:  80 y.o.WM PMH Depression, Anxietyx DM type II, diet-controlled, COPD, Asthma, stroke, GERD, Hypothyroidism, , OSA on CPAP, IBS, urethral stricture, Diverticulitis, GI bleeding, BPH, Interstitial Lung disease, Dementia,   Presents with abdominal pain, chest pain, generalized weakness. CT abdomen/pelvis with contrast showed large left lower lobe mass with multiple liver metastasis.Liver biopsy performed on 2/14.   Subjective: Pt was more alert today.  Agreeable to drinking some ice tea for me.  Complained of back pain.    Son now is ready for hospice referral.   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 -Elevated liver function due to liver metastasis. -2/14 s/p liver biopsy; awaiting results -Patient is followed by oncology, biopsy results not available. -Decrease pain medication secondary to decreased cognition -2/16 Echocardiogram pending.  Patient with metastatic cancer -2/18 metastatic neuroendocrine tumor see results below.  Oncology on board,  palliative care on board.  Son is an NP at [employer] ED  --Health care POA son [name] changed goals of care on 2/21 and wanted to keep pt alive.  Hospice referral therefore canceled. --Son ready for hospice referral today.  Interstitial lung disease/COPD --not in exacerbation --cont Dulera and Spiriva  Lactic acidosis persistent 2/2 metastatic cancer -2/16 D5-0.9% saline 154ml/hr -2/17 increasing lactic acidosis bolus normal saline 182ml/hr -2/18 even with aggressive hydration lactic acidosis remains.  Acute metabolic encephalopathy Baseline dementia --Pt getting progressively more lethargic, not responsive, and decreased oral intake.  Encephalopathy likely due to cancer process, persistent lactic acidosis, poor oral intake, etc. -2/18 per palliative care recommendation started modafinil --oxycodone d/c'ed, per son's request, to attempt to improve pt's somnolence --Order Norco for back pain today since pt is now heading to hospice  Severe suicidal thoughts Depression -psych consulted and following --cont Remeron and Modafinil  Failure to thrive/Anorexia Hypoglycemia -Megace 400 mg daily --cont Remeron --BG checks ACHS to monitor for hypoglycemia --Family does not want tube feed currently  DM type II controlled without complication --W1X= 5.0 --BG have been wnl --no need for SSI  Elevated liver enzymes -Secondary to metastatic cancer.  This along with patient's increasing lactic acidosis despite aggressive treatment poor prognostic factor for survival  BPH --cont home dutasteride and flomax  Goals of care -2/17 palliative care; 2/17 spoke NP [name] (son) spoke at length with son and he expressed wish that his father be comfortable and passed with dignity.  Would like palliative care to become involved discussed options. -2/18 SNF not a realistic option as patient unable to participate in any meaningful rehab.     --  Health care POA son [name] changed goals of care on 2/21 and  wanted to keep pt alive.  Hospice referral therefore canceled. --son ready for hospice referral today.   DVT prophylaxis: Lovenox Code Status: DNR Family Communication:  Status is: Inpatient  Dispo: The patient is from: Home              Anticipated d/c is to: hospice facility              Anticipated d/c date is: tomorrow              Consultants:  Psychiatry Oncology  Procedures/Significant Events:  2/14 LEFT liver lobe biopsy;POORLY DIFFERENTIATED CARCINOMA WITH NEUROENDOCRINE DIFFERENTIATION AND EXTENSIVE NECROSIS 2/16 Echocardiogram;Left Ventricle: LVEF=60 to 65%.-. Left ventricular diastolic function  could not be evaluated.     Cultures   Antimicrobials: Anti-infectives (From admission, onward)   None        Continuous Infusions:    Objective: Vitals:   01/16/21 2247 01/17/21 0444 01/17/21 0809 01/17/21 1206  BP: 132/73 120/68 (!) 142/66 (!) 125/95  Pulse: 82 79 78 80  Resp: 16 18 (!) 22 20  Temp: 98.5 F (36.9 C) 97.9 F (36.6 C) (!) 97.4 F (36.3 C) 97.6 F (36.4 C)  TempSrc:   Oral Oral  SpO2: 99% 96% 99% 100%  Weight:      Height:        Intake/Output Summary (Last 24 hours) at 01/17/2021 1429 Last data filed at 01/17/2021 1013 Gross per 24 hour  Intake 477 ml  Output 326 ml  Net 151 ml   Filed Weights   01/03/21 1104  Weight: 117.9 kg    Examination:  Constitutional: NAD, more alert, not oriented, said a few words HEENT: conjunctivae and lids normal, EOMI CV: No cyanosis.   RESP: normal respiratory effort, on 2L Extremities: 2+ pitting edema in BLE SKIN: warm, dry   CBC: Recent Labs  Lab 01/11/21 0410 01/12/21 0338 01/13/21 0441 01/15/21 0435 01/16/21 1039 01/17/21 0522  WBC 11.4* 10.3 10.3 7.7 10.2 10.5  NEUTROABS 9.0*  --   --   --   --   --   HGB 12.5* 12.6* 13.2 13.2 12.3* 11.9*  HCT 39.1 38.8* 40.0 40.9 38.2* 37.0*  MCV 93.1 93.0 92.6 91.9 92.5 92.3  PLT 150 151 179 229 203 527   Basic Metabolic  Panel: Recent Labs  Lab 01/11/21 0410 01/12/21 0338 01/13/21 0441 01/15/21 0435 01/16/21 1039 01/17/21 0522  NA 138 137 136 138 138 140  K 4.0 4.1 4.2 4.2 3.7 3.7  CL 106 106 104 104 103 105  CO2 25 22 22 22 24 25   GLUCOSE 94 69* 63* 99 84 75  BUN 14 14 16  28* 30* 27*  CREATININE 0.73 0.76 0.74 1.01 0.87 0.72  CALCIUM 8.8* 9.0 8.9 9.3 9.3 9.2  MG 2.2 2.3 2.3 2.7* 2.7* 2.8*  PHOS 3.0  --   --   --   --   --    GFR: Estimated Creatinine Clearance: 100.9 mL/min (by C-G formula based on SCr of 0.72 mg/dL). Liver Function Tests: Recent Labs  Lab 01/11/21 0410  AST 216*  ALT 75*  ALKPHOS 445*  BILITOT 2.3*  PROT 5.8*  ALBUMIN 2.2*   No results for input(s): LIPASE, AMYLASE in the last 168 hours. Recent Labs  Lab 01/11/21 0410  AMMONIA 49*   Coagulation Profile: No results for input(s): INR, PROTIME in the last 168 hours. Cardiac Enzymes: No results for  input(s): CKTOTAL, CKMB, CKMBINDEX, TROPONINI in the last 168 hours. BNP (last 3 results) No results for input(s): PROBNP in the last 8760 hours. HbA1C: No results for input(s): HGBA1C in the last 72 hours. CBG: Recent Labs  Lab 01/16/21 0823 01/16/21 1115 01/17/21 0032 01/17/21 0735 01/17/21 1314  GLUCAP 91 75 79 84 107*   Lipid Profile: No results for input(s): CHOL, HDL, LDLCALC, TRIG, CHOLHDL, LDLDIRECT in the last 72 hours. Thyroid Function Tests: No results for input(s): TSH, T4TOTAL, FREET4, T3FREE, THYROIDAB in the last 72 hours. Anemia Panel: No results for input(s): VITAMINB12, FOLATE, FERRITIN, TIBC, IRON, RETICCTPCT in the last 72 hours. Sepsis Labs: Recent Labs  Lab 01/10/21 1601  LATICACIDVEN 4.1*    No results found for this or any previous visit (from the past 240 hour(s)).       Radiology Studies: No results found.      Scheduled Meds: . cholecalciferol  1,000 Units Oral Daily  . donepezil  10 mg Oral QHS  . dutasteride  0.5 mg Oral Daily  . enoxaparin (LOVENOX) injection   0.5 mg/kg Subcutaneous Q24H  . feeding supplement  237 mL Oral TID BM  . fluticasone  1 spray Each Nare Daily  . hydrOXYzine  20 mg Oral QHS  . levothyroxine  50 mcg Oral Q0600  . megestrol  400 mg Oral Daily  . melatonin  3 mg Oral QHS  . mirtazapine  15 mg Oral QHS  . modafinil  100 mg Oral Daily  . mometasone-formoterol  2 puff Inhalation BID  . multivitamin with minerals  1 tablet Oral Daily  . senna-docusate  2 tablet Oral BID  . tamsulosin  0.4 mg Oral Daily  . tiotropium  18 mcg Inhalation Daily  . topiramate  50 mg Oral BID  . vitamin B-12  1,000 mcg Oral Daily   Continuous Infusions:    LOS: 14 days     Enzo Bi, MD [facility]   If 7PM-7AM, please contact night-coverage 01/17/2021, 2:29 PM

## 2021-01-18 DIAGNOSIS — R410 Disorientation, unspecified: Secondary | ICD-10-CM | POA: Diagnosis not present

## 2021-01-18 LAB — GLUCOSE, CAPILLARY
Glucose-Capillary: 72 mg/dL (ref 70–99)
Glucose-Capillary: 79 mg/dL (ref 70–99)
Glucose-Capillary: 84 mg/dL (ref 70–99)
Glucose-Capillary: 93 mg/dL (ref 70–99)

## 2021-01-18 MED ORDER — DEXTROSE 50 % IV SOLN
25.0000 mL | Freq: Once | INTRAVENOUS | Status: AC
  Administered 2021-01-18: 25 mL via INTRAVENOUS

## 2021-01-18 NOTE — Discharge Summary (Signed)
Physician Discharge Summary   Eddie Carter  male DOB: Dec 16, 1940  HFW:[id number]  PCP: [name], MD  Admit date: 01/03/2021 Discharge date: 01/18/2021  Admitted From: home Disposition:  Hospice facility CODE STATUS: DNR    30 Day Unplanned Readmission Risk Score   Flowsheet Row ED to Hosp-Admission (Current) from 01/03/2021 in [place] (1C)  30 Day Unplanned Readmission Risk Score (%) 28.13 Filed at 01/18/2021 0800     This score is the patient's risk of an unplanned readmission within 30 days of being discharged (0 -100%). The score is based on dignosis, age, lab data, medications, orders, and past utilization.   Low:  0-14.9   Medium: 15-21.9   High: 22-29.9   Extreme: 30 and above         Hospital Course:  For full details, please see H&P, progress notes, consult notes and ancillary notes.  Briefly,  Eddie Carter is a 80 y.o.WM PMH Depression, Anxietyx DM type II, diet-controlled, COPD, Asthma, stroke, GERD, Hypothyroidism, , OSA on CPAP, IBS, urethral stricture, Diverticulitis, GI bleeding, BPH, Interstitial Lung disease, Dementia who presented with abdominal pain, chest pain, generalized weakness. 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  Lung mass with liver and brain metastasis -Findings most consistent with metastatic lung cancer.  Abdominal pain and chest pain secondary to metastatic cancer. -Elevated liver function due to liver metastasis. -2/14 s/p liver biopsy showed metastatic neuroendocrine tumor.  Oncology on board, palliative care on board.  Son Telford Nab is an NP at [facility] ED, who was ready for hospice referral on 2/25.  Interstitial lung disease/COPD --not in exacerbation --cont Dulera and Spiriva while inpatient.  Lactic acidosis persistent 2/2 metastatic cancer -2/16 D5-0.9% saline 121ml/hr -2/17 increasing lactic acidosis bolus normal saline  126ml/hr -2/18 even with aggressive hydration lactic acidosis remains.  Acute metabolic encephalopathy Baseline dementia --Pt getting progressively more lethargic, not responsive, and decreased oral intake.  Encephalopathy likely due to cancer process, persistent lactic acidosis, poor oral intake, etc. -2/18 per palliative care recommendation started modafinil --oxycodone d/c'ed, per son's request, to attempt to improve pt's somnolence --Order Norco for back pain ordered on 2/25 since pt is now heading to hospice  Severe suicidal thoughts Depression -psych consulted and following --cont Remeron and Modafinil  Failure to thrive/Anorexia Hypoglycemia BG dropping due to poor oral intake.  Started on Megace 400 mg daily. --cont Remeron --BG checks ACHS to monitor for hypoglycemia while inpatient. --Family does not want tube feed currently  DM type II controlled without complication --O2D= 5.0 --no need for SSI  Elevated liver enzymes -Secondary to metastatic cancer.  This along with patient's increasing lactic acidosis despite aggressive treatment poor prognostic factor for survival  BPH --cont home dutasteride and flomax  Goals of care -2/17 palliative care; 2/17 spoke NP [name] (son) spoke at length with son and he expressed wish that his father be comfortable and passed with dignity.  Would like palliative care to become involved discussed options. -2/18 SNF not a realistic option as patient unable to participate in any meaningful rehab.     --Health care POA son [name] changed goals of care on 2/21 and wanted to keep pt alive.  Hospice referral therefore canceled. --son ready for hospice referral on 2/25.   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   Tolmetin Other (See Comments)   Makes him bleed.      Medication List    STOP taking these medications   acetaminophen 500 MG tablet Commonly known as: TYLENOL   amoxicillin 500 MG capsule Commonly known as: AMOXIL   clopidogrel 75 MG tablet Commonly known as: PLAVIX   Co Q-10 100 MG Caps   furosemide 40 MG tablet Commonly known as: LASIX   hydrOXYzine 10 MG tablet Commonly known as: ATARAX/VISTARIL   ketoconazole 2 % shampoo Commonly known as: NIZORAL   levothyroxine 50 MCG tablet Commonly known as: SYNTHROID   melatonin 3 MG Tabs tablet   metroNIDAZOLE 500 MG tablet Commonly known as: FLAGYL   Toviaz 8 MG Tb24 tablet Generic drug: fesoterodine   vitamin B-12 1000 MCG tablet Commonly known as: CYANOCOBALAMIN     TAKE these medications   Advair HFA 230-21 MCG/ACT inhaler Generic drug: fluticasone-salmeterol INHALE TWO PUFFS TWICE A DAY What changed: See the new instructions.   albuterol 108 (90 Base) MCG/ACT inhaler Commonly known as: VENTOLIN HFA PLACE ONE INHALATION TWICE DAILY What changed: See the new instructions.   dutasteride 0.5 MG capsule Commonly known as: AVODART TAKE 1 CAPSULE BY MOUTH EVERY DAY What changed: how much to take   fluticasone 50 MCG/ACT nasal spray Commonly known as: FLONASE TAKE 2 SPRAYS INTO EACH NOSTRIL EVERY DAY What changed: See the new  instructions.   Symbicort 160-4.5 MCG/ACT inhaler Generic drug: budesonide-formoterol USE 2 PUFFS INTO LUNGS TWICE DAILY What changed: See the new instructions.   tamsulosin 0.4 MG Caps capsule Commonly known as: FLOMAX TAKE 1 CAPSULE BY MOUTH EVERY DAY         Allergies  Allergen Reactions  . Asa [Aspirin] Shortness Of Breath  . Bee Venom Anaphylaxis  . Nsaids Other (See Comments)    Makes him bleed.  . Tolmetin Other (See Comments)    Makes him bleed.     The results of significant diagnostics from this hospitalization (including imaging, microbiology, ancillary and laboratory) are listed below for reference.   Consultations:   Procedures/Studies: CT ANGIO CHEST PE W OR WO CONTRAST  Result Date: 01/04/2021 CLINICAL DATA:  Left lung mass noted on CT scan of the abdomen from yesterday. EXAM: CT ANGIOGRAPHY CHEST WITH CONTRAST TECHNIQUE: Multidetector CT imaging of the chest was performed using the standard protocol during bolus administration of intravenous contrast. Multiplanar CT image reconstructions and MIPs were obtained to evaluate the vascular anatomy. CONTRAST:  63mL OMNIPAQUE IOHEXOL 350 MG/ML SOLN COMPARISON:  CT abdomen/pelvis 01/03/2021. FINDINGS: Cardiovascular: High mild cardiac enlargement but no pericardial effusion. Moderate atherosclerotic calcifications involving the aorta no focal aneurysm or dissection. Scattered coronary artery calcifications. The pulmonary arteries are unremarkable. No findings for pulmonary  embolism. Mediastinum/Nodes: Scattered borderline enlarged mediastinal and hilar lymph nodes. Prevascular lymph node on image number 33/4 measures 9.5 mm. Small AP window nodes are less than 6 mm. Left hilar node on image number 49/4 measures 8 mm. Left-sided subcarinal lymph node on image 58/4 measures 10.5 mm. The esophagus is grossly normal.  The thyroid gland is unremarkable. Lungs/Pleura: As demonstrated on the recent abdominal CT scan there is a  lobulated appearing mass in the left lower lobe. No air bronchograms to suggest this is an infiltrate. It measures approximately 6.5 x 5.2 cm and is worrisome for neoplasm. 10 mm nodule in the left upper lobe on image number 47/6. 8 mm subpleural nodule in the left lower lobe on image number 58/6. 5.5 mm right lower lobe pulmonary nodule on image number 50/6. Findings consistent with pulmonary metastatic disease. Underlying emphysematous changes and pulmonary scarring. Upper Abdomen: Innumerable hepatic metastatic lesions are noted. Musculoskeletal: No chest wall mass, supraclavicular or axillary lymphadenopathy the. The bony thorax is intact. No findings suspicious for osseous metastatic disease. Review of the MIP images confirms the above findings. IMPRESSION: 1. 6.5 x 5.2 cm left lower lobe pulmonary mass worrisome for neoplasm. There are borderline enlarged hilar and mediastinal lymph nodes suspicious for metastatic adenopathy. 2. Bilateral pulmonary nodules consistent with pulmonary metastatic disease. 3. Innumerable hepatic metastatic lesions. 4. No findings suspicious for osseous metastatic disease. 5. PET-CT may be helpful for further evaluation and staging purposes. 6. Emphysema and aortic atherosclerosis. Aortic Atherosclerosis (ICD10-I70.0) and Emphysema (ICD10-J43.9). Electronically Signed   By: Marijo Sanes M.D.   On: 01/04/2021 15:33   MR BRAIN W WO CONTRAST  Result Date: 01/04/2021 CLINICAL DATA:  Non-small cell lung cancer.  Staging. EXAM: MRI HEAD WITHOUT AND WITH CONTRAST TECHNIQUE: Multiplanar, multiecho pulse sequences of the brain and surrounding structures were obtained without and with intravenous contrast. CONTRAST:  60mL GADAVIST GADOBUTROL 1 MMOL/ML IV SOLN COMPARISON:  Head CT 07/13/2017 FINDINGS: Brain: Axial and coronal postcontrast imaging are markedly degraded by patient motion. No focal abnormality affects the brainstem or cerebellum. Within the cerebral hemispheres, there is a  punctate focus of restricted diffusion adjacent to the posterior body of the left lateral ventricle image 79. There is a punctate focus of restricted diffusion in a left parietal vertex gyrus, image 88. The diffusion findings are suspicious for tiny metastases, but that cannot be confirmed because of the motion degradation. Consider repeat axial and coronal imaging when the patient is able to tolerate the procedure better. Otherwise, there are mild chronic small-vessel ischemic changes of the hemispheric white matter. No cortical or large vessel territory infarction. No sign of hemorrhage, hydrocephalus or extra-axial collection. Vascular: Major vessels at the base of the brain show flow. Skull and upper cervical spine: Negative Sinuses/Orbits: Clear/normal Other: None IMPRESSION: 1. Markedly motion degraded exam. Axial and coronal postcontrast imaging was markedly degraded and essentially nondiagnostic. 2. Two punctate foci of restricted diffusion in the left cerebral hemisphere, one adjacent to the posterior body of the left lateral ventricle and the other in a left parietal vertex gyrus. The diffusion findings are suspicious for tiny metastases, but that cannot be confirmed because of the motion degradation. Consider repeat axial and coronal postcontrast imaging when the patient is able to tolerate the procedure. Electronically Signed   By: Nelson Chimes M.D.   On: 01/04/2021 19:26   CT ABDOMEN PELVIS W CONTRAST  Result Date: 01/03/2021 CLINICAL DATA:  Right lower quadrant abdominal pain. EXAM: CT ABDOMEN AND PELVIS WITH  CONTRAST TECHNIQUE: Multidetector CT imaging of the abdomen and pelvis was performed using the standard protocol following bolus administration of intravenous contrast. CONTRAST:  174mL OMNIPAQUE IOHEXOL 300 MG/ML  SOLN COMPARISON:  October 29, 2014. FINDINGS: Lower chest: 5.6 x 4.4 cm lobulated mass is noted in the left lower lobe laterally concerning for malignancy. Hepatobiliary: No  gallstones or biliary dilatation is noted. Multiple rounded low densities are noted throughout the liver of varying sizes consistent with diffuse hepatic metastases. The largest measures 7.1 x 4.3 cm in the left hepatic lobe. Pancreas: Unremarkable. No pancreatic ductal dilatation or surrounding inflammatory changes. Spleen: Normal in size without focal abnormality. Adrenals/Urinary Tract: Adrenal glands are unremarkable. Kidneys are normal, without renal calculi, focal lesion, or hydronephrosis. Bladder is unremarkable. Stomach/Bowel: Stomach appears normal. Status post appendectomy. There is no evidence of bowel obstruction or inflammation. Vascular/Lymphatic: Atherosclerosis of abdominal aorta is noted without aneurysm or dissection. 2.4 cm left periaortic lymph node is noted concerning for metastatic disease. Reproductive: Prostate is unremarkable. Other: No abdominal wall hernia or abnormality. No abdominopelvic ascites. Musculoskeletal: No acute or significant osseous findings. 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 IMPRESSION: 1. 5.6 x 4.4 cm lobulated mass is noted in the left lower lobe laterally concerning for malignancy. 2. Multiple rounded low densities are noted throughout the liver of varying sizes consistent with diffuse hepatic metastases. The largest measures 7.1 x 4.3 cm in the left hepatic lobe. 3. 2.4 cm left periaortic lymph node is noted concerning for metastatic disease. 4. Aortic atherosclerosis. Aortic Atherosclerosis (ICD10-I70.0). Electronically Signed   By: Marijo Conception M.D.   On: 01/03/2021 13:51   US BIOPSY (LIVER)  Result Date: 01/06/2021 INDICATION: Lung and multiple liver lesions suspicious for metastatic disease EXAM: Ultrasound-guided biopsy of liver lesions MEDICATIONS: None. ANESTHESIA/SEDATION: None COMPLICATIONS: None immediate. PROCEDURE:  Informed written consent was obtained from the patient after a thorough discussion of the procedural risks, benefits and alternatives. All questions were addressed. Maximal Sterile Barrier Technique was utilized including caps, mask, sterile gowns, sterile gloves, sterile drape, hand hygiene and skin antiseptic. A timeout was performed prior to the initiation of the procedure. Patient position supine on the ultrasound table. Epigastric skin prepped and draped in usual sterile fashion. Following local lidocaine administration, 17 gauge introducer needle was advanced into 1 of the left hepatic lobe lesions, and four 18 gauge cores were obtained utilizing continuous ultrasound guidance. Gelfoam slurry was administered through the introducer needle at the biopsy site. Samples were sent to pathology in formalin. Needle removed and hemostasis achieved with 5 minutes of manual compression. Post procedure ultrasound images showed no evidence of significant hemorrhage. IMPRESSION: Ultrasound-guided biopsy of left liver lesion as above. Electronically Signed   By: Miachel Roux M.D.   On: 01/06/2021 13:46   ECHOCARDIOGRAM COMPLETE  Result Date: 01/08/2021    ECHOCARDIOGRAM REPORT   Patient Name:   HUIE GHUMAN Date of Exam: 01/08/2021 Medical Rec #:  [id number]      Height:       72.0 in Accession #:    [id number]     Weight:       260.0 lb Date of Birth:  June 25, 1941       BSA:          2.382 m Patient Age:    80 years       BP:  123/58 mmHg Patient Gender: M              HR:           91 bpm. Exam Location:  [facility] Procedure: 2D Echo, Color Doppler and Cardiac Doppler Indications:     CHF-acute systolic J85.63  History:         Patient has no prior history of Echocardiogram examinations.                  Stroke, Signs/Symptoms:Shortness of Breath; Risk Factors:Sleep                  Apnea.  Sonographer:     Sherrie Sport RDCS (AE) Referring Phys:  [id number] Geraldo Docker WOODS Diagnosing Phys: Kate Sable MD   Sonographer Comments: No apical window, no subcostal window and Technically challenging study due to limited acoustic windows. IMPRESSIONS  1. Left ventricular ejection fraction, by estimation, is 60 to 65%. The left ventricle has normal function. The left ventricle has no regional wall motion abnormalities. There is mild left ventricular hypertrophy. Left ventricular diastolic function could not be evaluated.  2. Right ventricular systolic function is normal. The right ventricular size is not well visualized.  3. The mitral valve is degenerative. No evidence of mitral valve regurgitation. Moderate mitral annular calcification.  4. The aortic valve is normal in structure. Aortic valve regurgitation is not visualized.  5. Aortic dilatation noted. There is mild dilatation of the aortic root, measuring 39 mm. 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 FINDINGS  Left Ventricle: Left ventricular ejection fraction, by estimation, is 60 to 65%. The left ventricle has normal function. The left ventricle has no regional wall motion abnormalities. The left ventricular internal cavity size was normal in size. There is  mild left ventricular hypertrophy. Left ventricular diastolic function could not be evaluated. Right Ventricle: The right ventricular size is not well visualized. No increase in right ventricular wall thickness. Right ventricular systolic function is normal. Left Atrium: Left atrial size was normal in size. Right Atrium: Right atrial size was not well visualized. Pericardium: There is no evidence of pericardial effusion. Mitral Valve: The mitral valve is degenerative in appearance. Moderate mitral annular calcification. No evidence of mitral valve regurgitation. Tricuspid Valve: The tricuspid valve is normal in structure. Tricuspid valve regurgitation is not demonstrated. Aortic Valve: The aortic valve is normal in structure. Aortic valve regurgitation is not visualized.  Pulmonic Valve: The pulmonic valve was not well visualized. Pulmonic valve regurgitation is not visualized. Aorta: Aortic dilatation noted. There is mild dilatation of the aortic root, measuring 39 mm. 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 Venous: The inferior vena cava was not well visualized. IAS/Shunts: The interatrial septum was not well visualized.  LEFT VENTRICLE PLAX 2D LVIDd:         4.54 cm LVIDs:         2.62 cm LV PW:         1.45 cm LV IVS:        1.58 cm LVOT diam:     2.30 cm LVOT Area:     4.15 cm  LEFT ATRIUM         Index LA diam:    2.80 cm 1.18 cm/m                        PULMONIC VALVE AORTA  PV Vmax:        0.48 m/s Ao Root diam: 4.13 cm PV Peak grad:   0.9 mmHg                       RVOT Peak grad: 2 mmHg   SHUNTS Systemic Diam: 2.30 cm Kate Sable MD Electronically signed by Kate Sable MD Signature Date/Time: 01/08/2021/3:58:28 PM    Final       Labs: BNP (last 3 results) No results for input(s): BNP in the last 8760 hours. Basic Metabolic Panel: Recent Labs  Lab 01/12/21 0338 01/13/21 0441 01/15/21 0435 01/16/21 1039 01/17/21 0522  NA 137 136 138 138 140  K 4.1 4.2 4.2 3.7 3.7  CL 106 104 104 103 105  CO2 22 22 22 24 25   GLUCOSE 69* 63* 99 84 75  BUN 14 16 28* 30* 27*  CREATININE 0.76 0.74 1.01 0.87 0.72  CALCIUM 9.0 8.9 9.3 9.3 9.2  MG 2.3 2.3 2.7* 2.7* 2.8*   Liver Function Tests: No results for input(s): AST, ALT, ALKPHOS, BILITOT, PROT, ALBUMIN in the last 168 hours. No results for input(s): LIPASE, AMYLASE in the last 168 hours. No results for input(s): AMMONIA in the last 168 hours. CBC: Recent Labs  Lab 01/12/21 0338 01/13/21 0441 01/15/21 0435 01/16/21 1039 01/17/21 0522  WBC 10.3 10.3 7.7 10.2 10.5  HGB 12.6* 13.2 13.2 12.3* 11.9*  HCT 38.8* 40.0 40.9 38.2* 37.0*  MCV 93.0 92.6 91.9 92.5 92.3  PLT 151 179 229 203 219   Cardiac Enzymes: No results for input(s): CKTOTAL,  CKMB, CKMBINDEX, TROPONINI in the last 168 hours. BNP: Invalid input(s): POCBNP CBG: Recent Labs  Lab 01/17/21 2312 01/17/21 2358 01/18/21 0030 01/18/21 0455 01/18/21 0836  GLUCAP 73 68* 84 72 79   D-Dimer No results for input(s): DDIMER in the last 72 hours. Hgb A1c No results for input(s): HGBA1C in the last 72 hours. Lipid Profile No results for input(s): CHOL, HDL, LDLCALC, TRIG, CHOLHDL, LDLDIRECT in the last 72 hours. Thyroid function studies No results for input(s): TSH, T4TOTAL, T3FREE, THYROIDAB in the last 72 hours.  Invalid input(s): FREET3 Anemia work up No results for input(s): VITAMINB12, FOLATE, FERRITIN, TIBC, IRON, RETICCTPCT in the last 72 hours. Urinalysis    Component Value Date/Time   COLORURINE AMBER (A) 01/03/2021 1623   APPEARANCEUR CLEAR (A) 01/03/2021 1623   APPEARANCEUR Cloudy (A) 07/12/2020 1323   LABSPEC 1.043 (H) 01/03/2021 1623   LABSPEC 1.012 07/25/2013 1646   PHURINE 5.0 01/03/2021 1623   GLUCOSEU NEGATIVE 01/03/2021 1623   GLUCOSEU Negative 07/25/2013 1646   HGBUR NEGATIVE 01/03/2021 1623   BILIRUBINUR NEGATIVE 01/03/2021 1623   BILIRUBINUR Negative 07/12/2020 1323   BILIRUBINUR Negative 07/25/2013 1646   KETONESUR NEGATIVE 01/03/2021 1623   PROTEINUR NEGATIVE 01/03/2021 1623   NITRITE NEGATIVE 01/03/2021 1623   LEUKOCYTESUR NEGATIVE 01/03/2021 1623   LEUKOCYTESUR Negative 07/25/2013 1646   Sepsis Labs Invalid input(s): PROCALCITONIN,  WBC,  LACTICIDVEN Microbiology No results found for this or any previous visit (from the past 240 hour(s)).   Total time spend on discharging this patient, including the last patient exam, discussing the hospital stay, instructions for ongoing care as it relates to all pertinent caregivers, as well as preparing the medical discharge records, prescriptions, and/or referrals as applicable, is 30 minutes.    Enzo Bi, MD  [facility] 01/18/2021, 8:55 AM

## 2021-01-18 NOTE — Progress Notes (Signed)
Hypoglycemic Event  01/17/2021 at 2358- CBG: 68  Treatment: D50 25 mL (12.5 gm)  Symptoms: None  Follow-up CBG: Time:0030 CBG Result:84  Possible Reasons for Event: Inadequate meal intake  Comments/MD notified: followed hypoglycemia protocol    Gerhard Perches

## 2021-01-18 NOTE — Progress Notes (Signed)
Patient received awake and alert. POC include patient going to Hospice house tomorrow. Patient noted maintaining sats above 94%.  2L [place] maintained for comfort.Pain controlled with PRN pain meds. Needs anticipated, incontinent of bladder, condom cath in place. Pt safety risks identified, addressed, and maintained to prevent injury. Comfort and hygiene measures provided, q4 vitals and q1 safety checks completed and hygiene assistance provided as needed. Personal items within reach. Bed in lowest position. Call light within reach. Will continue to monitor and endorse

## 2021-01-18 NOTE — Progress Notes (Signed)
This RN attempted to call report to the Hospice Home twice with no response. Voicemail with call-back number provided.

## 2021-01-18 NOTE — Progress Notes (Signed)
This RN provided report to Otila Kluver, Nurse assuming care at the hospice home. All outstanding questions resolved. Pt's son made aware of departure. All belongings packed and in tow.

## 2021-01-18 NOTE — Plan of Care (Signed)
  Problem: Education: Goal: Knowledge of General Education information will improve Description: Including pain rating scale, medication(s)/side effects and non-pharmacologic comfort measures 01/18/2021 1108 by Cristela Blue, RN Outcome: Progressing 01/18/2021 1108 by Cristela Blue, RN Outcome: Progressing   Problem: Health Behavior/Discharge Planning: Goal: Ability to manage health-related needs will improve 01/18/2021 1108 by Cristela Blue, RN Outcome: Progressing 01/18/2021 1108 by Cristela Blue, RN Outcome: Progressing   Problem: Clinical Measurements: Goal: Ability to maintain clinical measurements within normal limits will improve 01/18/2021 1108 by Cristela Blue, RN Outcome: Progressing 01/18/2021 1108 by Cristela Blue, RN Outcome: Progressing Goal: Will remain free from infection 01/18/2021 1108 by Cristela Blue, RN Outcome: Progressing 01/18/2021 1108 by Cristela Blue, RN Outcome: Progressing Goal: Diagnostic test results will improve 01/18/2021 1108 by Cristela Blue, RN Outcome: Progressing 01/18/2021 1108 by Cristela Blue, RN Outcome: Progressing Goal: Respiratory complications will improve 01/18/2021 1108 by Cristela Blue, RN Outcome: Progressing 01/18/2021 1108 by Cristela Blue, RN Outcome: Progressing Goal: Cardiovascular complication will be avoided 01/18/2021 1108 by Cristela Blue, RN Outcome: Progressing 01/18/2021 1108 by Cristela Blue, RN Outcome: Progressing   Problem: Activity: Goal: Risk for activity intolerance will decrease 01/18/2021 1108 by Cristela Blue, RN Outcome: Progressing 01/18/2021 1108 by Cristela Blue, RN Outcome: Progressing   Problem: Nutrition: Goal: Adequate nutrition will be maintained 01/18/2021 1108 by Cristela Blue, RN Outcome: Progressing 01/18/2021 1108 by Cristela Blue, RN Outcome: Progressing   Problem: Coping: Goal: Level of anxiety will decrease 01/18/2021 1108 by Cristela Blue, RN Outcome:  Progressing 01/18/2021 1108 by Cristela Blue, RN Outcome: Progressing   Problem: Elimination: Goal: Will not experience complications related to bowel motility 01/18/2021 1108 by Cristela Blue, RN Outcome: Progressing 01/18/2021 1108 by Cristela Blue, RN Outcome: Progressing Goal: Will not experience complications related to urinary retention 01/18/2021 1108 by Cristela Blue, RN Outcome: Progressing 01/18/2021 1108 by Cristela Blue, RN Outcome: Progressing   Problem: Pain Managment: Goal: General experience of comfort will improve 01/18/2021 1108 by Cristela Blue, RN Outcome: Progressing 01/18/2021 1108 by Cristela Blue, RN Outcome: Progressing   Problem: Safety: Goal: Ability to remain free from injury will improve 01/18/2021 1108 by Cristela Blue, RN Outcome: Progressing 01/18/2021 1108 by Cristela Blue, RN Outcome: Progressing   Problem: Skin Integrity: Goal: Risk for impaired skin integrity will decrease 01/18/2021 1108 by Cristela Blue, RN Outcome: Progressing 01/18/2021 1108 by Cristela Blue, RN Outcome: Progressing

## 2021-02-21 DEATH — deceased

## 2021-04-03 ENCOUNTER — Encounter: Payer: Medicare PPO | Admitting: Dermatology

## 2021-04-08 ENCOUNTER — Ambulatory Visit (INDEPENDENT_AMBULATORY_CARE_PROVIDER_SITE_OTHER): Payer: Medicare PPO | Admitting: Nurse Practitioner
